# Patient Record
Sex: Male | Born: 1940 | Race: White | Hispanic: No | Marital: Married | Smoking: Former smoker
Health system: Southern US, Community
[De-identification: ages and names within clinical notes are randomized; demographics above are authoritative.]

## PROBLEM LIST (undated history)

## (undated) DIAGNOSIS — J189 Pneumonia, unspecified organism: Secondary | ICD-10-CM

## (undated) DIAGNOSIS — I714 Abdominal aortic aneurysm, without rupture, unspecified: Secondary | ICD-10-CM

## (undated) DIAGNOSIS — I4821 Permanent atrial fibrillation: Secondary | ICD-10-CM

## (undated) DIAGNOSIS — M1990 Unspecified osteoarthritis, unspecified site: Secondary | ICD-10-CM

## (undated) DIAGNOSIS — E119 Type 2 diabetes mellitus without complications: Secondary | ICD-10-CM

## (undated) DIAGNOSIS — Z9289 Personal history of other medical treatment: Secondary | ICD-10-CM

## (undated) DIAGNOSIS — E785 Hyperlipidemia, unspecified: Secondary | ICD-10-CM

## (undated) DIAGNOSIS — I4891 Unspecified atrial fibrillation: Secondary | ICD-10-CM

## (undated) DIAGNOSIS — U071 COVID-19: Secondary | ICD-10-CM

## (undated) DIAGNOSIS — H9193 Unspecified hearing loss, bilateral: Secondary | ICD-10-CM

## (undated) DIAGNOSIS — I739 Peripheral vascular disease, unspecified: Secondary | ICD-10-CM

## (undated) DIAGNOSIS — K635 Polyp of colon: Secondary | ICD-10-CM

## (undated) DIAGNOSIS — I729 Aneurysm of unspecified site: Secondary | ICD-10-CM

## (undated) DIAGNOSIS — I10 Essential (primary) hypertension: Secondary | ICD-10-CM

## (undated) DIAGNOSIS — N200 Calculus of kidney: Secondary | ICD-10-CM

## (undated) DIAGNOSIS — N2889 Other specified disorders of kidney and ureter: Secondary | ICD-10-CM

## (undated) DIAGNOSIS — J449 Chronic obstructive pulmonary disease, unspecified: Secondary | ICD-10-CM

## (undated) HISTORY — DX: Other specified disorders of kidney and ureter: N28.89

## (undated) HISTORY — DX: Essential (primary) hypertension: I10

## (undated) HISTORY — DX: Calculus of kidney: N20.0

## (undated) HISTORY — DX: Abdominal aortic aneurysm, without rupture: I71.4

## (undated) HISTORY — PX: PILONIDAL CYST EXCISION: SHX744

## (undated) HISTORY — DX: Type 2 diabetes mellitus without complications: E11.9

## (undated) HISTORY — DX: Unspecified atrial fibrillation: I48.91

## (undated) HISTORY — DX: COVID-19: U07.1

## (undated) HISTORY — DX: Polyp of colon: K63.5

## (undated) HISTORY — DX: Abdominal aortic aneurysm, without rupture, unspecified: I71.40

## (undated) HISTORY — DX: Unspecified osteoarthritis, unspecified site: M19.90

## (undated) HISTORY — DX: Permanent atrial fibrillation: I48.21

## (undated) HISTORY — PX: FRACTURE SURGERY: SHX138

## (undated) HISTORY — DX: Unspecified hearing loss, bilateral: H91.93

---
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
## 2004-04-09 ENCOUNTER — Emergency Department (HOSPITAL_COMMUNITY): Admission: EM | Admit: 2004-04-09 | Discharge: 2004-04-09 | Payer: Self-pay | Admitting: Emergency Medicine

## 2007-06-01 DIAGNOSIS — N2889 Other specified disorders of kidney and ureter: Secondary | ICD-10-CM

## 2007-06-01 HISTORY — DX: Other specified disorders of kidney and ureter: N28.89

## 2007-12-30 DIAGNOSIS — Z9289 Personal history of other medical treatment: Secondary | ICD-10-CM

## 2007-12-30 HISTORY — DX: Personal history of other medical treatment: Z92.89

## 2007-12-30 HISTORY — PX: EMBOLIZATION: SHX1496

## 2008-01-06 ENCOUNTER — Ambulatory Visit: Payer: Self-pay | Admitting: Cardiology

## 2008-01-06 ENCOUNTER — Emergency Department (HOSPITAL_COMMUNITY): Admission: EM | Admit: 2008-01-06 | Discharge: 2008-01-06 | Payer: Self-pay | Admitting: Emergency Medicine

## 2008-01-06 ENCOUNTER — Inpatient Hospital Stay (HOSPITAL_COMMUNITY): Admission: EM | Admit: 2008-01-06 | Discharge: 2008-01-14 | Payer: Self-pay | Admitting: Emergency Medicine

## 2008-01-10 ENCOUNTER — Encounter: Payer: Self-pay | Admitting: Urology

## 2008-01-11 ENCOUNTER — Encounter (INDEPENDENT_AMBULATORY_CARE_PROVIDER_SITE_OTHER): Payer: Self-pay | Admitting: Internal Medicine

## 2008-01-30 ENCOUNTER — Ambulatory Visit: Payer: Self-pay | Admitting: Cardiology

## 2008-02-28 ENCOUNTER — Ambulatory Visit (HOSPITAL_COMMUNITY): Admission: RE | Admit: 2008-02-28 | Discharge: 2008-02-28 | Payer: Self-pay | Admitting: Urology

## 2008-03-12 ENCOUNTER — Ambulatory Visit: Payer: Self-pay | Admitting: Cardiology

## 2008-03-18 ENCOUNTER — Ambulatory Visit: Payer: Self-pay | Admitting: Vascular Surgery

## 2008-03-21 ENCOUNTER — Ambulatory Visit (HOSPITAL_COMMUNITY): Admission: RE | Admit: 2008-03-21 | Discharge: 2008-03-21 | Payer: Self-pay | Admitting: Cardiology

## 2008-03-21 ENCOUNTER — Encounter: Payer: Self-pay | Admitting: Cardiology

## 2008-03-21 ENCOUNTER — Ambulatory Visit: Payer: Self-pay | Admitting: Cardiology

## 2008-04-16 ENCOUNTER — Ambulatory Visit: Payer: Self-pay | Admitting: Cardiology

## 2009-06-03 ENCOUNTER — Encounter: Admission: RE | Admit: 2009-06-03 | Discharge: 2009-06-03 | Payer: Self-pay | Admitting: Vascular Surgery

## 2009-06-03 ENCOUNTER — Ambulatory Visit: Payer: Self-pay | Admitting: Vascular Surgery

## 2010-06-22 ENCOUNTER — Encounter: Payer: Self-pay | Admitting: Emergency Medicine

## 2010-06-22 ENCOUNTER — Encounter: Payer: Self-pay | Admitting: Vascular Surgery

## 2010-09-09 ENCOUNTER — Other Ambulatory Visit: Payer: Self-pay | Admitting: Vascular Surgery

## 2010-09-09 DIAGNOSIS — I714 Abdominal aortic aneurysm, without rupture, unspecified: Secondary | ICD-10-CM

## 2010-09-22 ENCOUNTER — Ambulatory Visit: Payer: Self-pay | Admitting: Vascular Surgery

## 2010-09-22 ENCOUNTER — Other Ambulatory Visit: Payer: Self-pay

## 2010-10-13 NOTE — Group Therapy Note (Signed)
NAMEMELIK, BLANCETT NO.:  [id number]   MEDICAL RECORD NO.:  [id number]          PATIENT TYPE:  INP   LOCATION:  A319                          FACILITY:  [facility]   PHYSICIAN:  Skeet Latch, DO    DATE OF BIRTH:  01-05-1941   DATE OF PROCEDURE:  01/07/2008  DATE OF DISCHARGE:                                 PROGRESS NOTE   SUBJECTIVE:  [name] is still having left flank/back pain this  morning.  The patient states it is slightly improved but still has  moderate to severe flank pain.  The patient has no chest pains or any  other associated symptoms.  The patient states that he thought he had to  have a bowel movement this morning on his lower left side but when he  went to the restroom he did not have to go.   OBJECTIVE:  VITAL SIGNS:  Temperature is 98.2, pulse 92, respirations  24, blood pressure 95/66.  CARDIOVASCULAR:  Regular rate and rhythm.  No murmurs, rubs, gallops.  LUNGS:  Clear to auscultation bilaterally.  No rales, rhonchi or  wheezes.  ABDOMEN:  Soft, obese, nontender, nondistended.  MUSCULOSKELETAL:  Continues to have left flank pain and some slight left-  sided back pain and still tender on percussion.  EXTREMITIES:  No clubbing, cyanosis or edema.   LABORATORY DATA:  His PT is 30.9, INR 2.7, PTT 54, white count 17.7,  hemoglobin 1.9, hematocrit 34.7, platelet count is 259, phosphorous 3.5,  magnesium 1.9, sodium 137, potassium 3.8, chloride is 98, CO2 28,  glucose 170, BUN 23, creatinine 1.57.   ASSESSMENT AND PLAN:  1. Left-sided renal colic with intraparenchymal subscapular hemorrhage      of the left kidney.  At this time the patient's blood pressure is      slightly lower than yesterday and he is slightly tachycardiac.  At      this point we will get a urology consult.  We will also get a stat      H&H to check his blood count at this time.  We will increase his IV      fluids and maintain him on IV pain medications.  If patient's   condition changes the patient may need to be transferred to the      ICU.  2. Atrial fibrillation.  This seems to be stable.  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  Seems to be rate controlled at this time.  However,      will need to hold his blood pressure medications secondary to some      hypotension at this time.  At this time I will await urology      recommendations.      Skeet Latch, DO  Electronically Signed     SM/MEDQ  D:  01/07/2008  T:  01/07/2008  Job:  [id number]

## 2010-10-13 NOTE — H&P (Signed)
[name], [name]                ACCOUNT NO.:  [id number]   MEDICAL RECORD NO.:  [id number]          PATIENT TYPE:  INP   LOCATION:  A319                          FACILITY:  [facility]   PHYSICIAN:  Skeet Latch, DO    DATE OF BIRTH:  04-08-1941   DATE OF ADMISSION:  01/06/2008  DATE OF DISCHARGE:  LH                              HISTORY & PHYSICAL   CHIEF COMPLAINT:  Left flank pain and nausea and vomiting.   HISTORY OF PRESENT ILLNESS:  This is a 70 year old Caucasian male who  presented to the emergency room earlier today complaining of bilateral  flank pain.  The patient states that the pain started when he awoke this  morning and was 9-10/10 in severity.  The patient states after this  pain, he was getting some nausea and vomiting and decided come to  emergency room to be evaluated.  The patient was seen in the emergency  room earlier today, found to have some flank pain and hematuria and was  given Percocet and Phenergan and told to follow with urology.  The  patient said he went home, felt a little bit better.  The pain became  more severe, and he returned to the hospital.  Upon returning to the  hospital, CT of his abdomen and pelvis revealed an acute  intraparenchymal and subscapular hemorrhage his left kidney.  The  patient denies any history of any trauma to his flank area.  The patient  does take Coumadin for history of atrial fibrillation, however.   PAST MEDICAL HISTORY:  Includes atrial fibrillation, hypertension,  history of kidney stones.  Meniere's disease.   SOCIAL HISTORY:  He is a smoker.  No history of drug or alcohol abuse.   FAMILY HISTORY:  His mother died from renal cancer.  Has a nephew who  recently died, had a history of kidney disease also.   SURGICAL HISTORY:  Pilonidal cyst removed, and leg fracture repair.   HOME MEDICATIONS:  Include aspirin 81 mg daily, Benicar 40 mg daily,  potassium 240 mg daily, meclizine 25 mg daily, multivitamin daily,  Nasonex 1 spray each nostril at bedtime, trazodone 50 mg at bedtime,  triamterene/hydrochlorothiazide 37.5/25 mg daily, Coumadin, specialized  dosing, Zyrtec 10 mg daily.   ALLERGIES:  NO KNOWN DRUG ALLERGIES.   REVIEW OF SYSTEMS:  GENERAL:  No weight loss or weight gain.  No fever  or chills.  GASTROINTESTINAL:  Positive for some nausea and vomiting.  No bloody stools.  GENITOURINARY:  Slight hematuria.  No dysuria.  CARDIOVASCULAR:  No chest pain, palpitations.  RESPIRATORY:  No  shortness of breath or dyspnea.  MUSCULOSKELETAL:  Positive for flank  pain, left greater than right.   PHYSICAL EXAMINATION:  VITAL SIGNS:  Temperature is 98.6, blood pressure  114/58, pulse 79, respirations 16.  He is satting 100%.  GENERAL:  He is an obese 70 year old Caucasian male, well-developed,  well-nourished, well-hydrated, looks uncomfortable on exam.  HEENT:  Head is atraumatic, normocephalic.  Eyes are PERRL.  EOMI.  NECK:  Soft, supple, nontender, nondistended.  No scleral icterus.  CARDIOVASCULAR:  Regular rate and rhythm.  No murmurs.  LUNGS:  Clear to auscultation bilaterally.  No rales, rhonchi or  wheezing or expiratory effort.  ABDOMEN:  Obese, soft, nontender, nondistended.  MUSCULOSKELETAL:  Positive for left flank pain, per patient.  No signs  of bruising noted in his flank area.  The patient is tender on  percussion to his left flank area.  EXTREMITIES:  No clubbing, cyanosis or edema.  NEUROLOGICALLY:  Cranial nerves 2-12 are grossly intact.  The patient is  alert and oriented x3.   LABS:  PTT is 41, PT 29.8, INR is 2.6.  Urine, microscopic 3-6 WBCs,  many bacteria.  His urinalysis, small amount of blood.  No nitrites or  leukocytes.  Sodium 137, potassium 4.6, chloride 102, CO2 is 27, glucose  117, BUN 23, creatinine 1.25, white count 11.8, hemoglobin 14.7,  hematocrit 43.5, platelet count is 198.   RADIOLOGIC STUDIES:  CT of his abdomen and pelvis showed acute   intraparenchymal and subscapular hemorrhage of the left kidney starting  at the lower pole and extending to the interpolar region.  This is not  causing significant renal obstruction and does not extend beyond the  confines of the kidney.  Appearance is suspicious for hemorrhage related  to underlying tumor.  Other considerations will be vascular  malformation, bleeding from any type of recent procedures, trauma or  spontaneous bleed.  Ultimately assessment with contrast and a CT or MRI  will be helpful to exclude underlying tumor, one an acute component of  hemorrhage has resolved.  Contrast studies of acute hemorrhage may not  be very accurate in depicting tumor.  Mild dilatation of distal  abdominal aorta to 3.3 cm.  There is no evidence of aortic leaks.  The  pelvis showed no acute findings.  Nonspecific enlargement of seminal  vesicles.   ASSESSMENT:  1. Left flank pain with intraparenchymal and subscapular hemorrhage of      the left kidney  2. History of atrial fibrillation.  3. History of hypertension.  4. Significant family history of renal disease.   PLAN:  1. The patient will be admitted to the general service of [facility].  2. For his flank pain and intraparenchymal hemorrhage of his left      kidney, at this time will follow the patient's hemoglobin and      hematocrit.  It is seen to be stable at this time.  At this time we      will just do pain control and watchful waiting.  If any changes in      his condition, the patient may need a surgical consult.  Will await      any changes in his condition.  The patient will need a CT or MRI      once his symptoms resolve, and the patient does not have as much      pain in his flank area.  3. For his atrial fibrillation, it is rate controlled at this time.      Continue his home medications.  Will hold all anticoagulants and      aspirin and NSAID secondary to his hemorrhage around his kidney.  4. Hypertension, again stable  at this time.  Will continue his home      medications.  5. Lastly, the patient be on DVT and GI prophylaxis.      Skeet Latch, DO  Electronically Signed     SM/MEDQ  D:  01/06/2008  T:  01/06/2008  Job:  [id number]

## 2010-10-13 NOTE — Letter (Signed)
April 16, 2008    Catalina Pizza, MD  [address] [place],  [place] [address]   RE:  DYMOND, SPREEN  MRN:  [id number]  /  DOB:  [birth year]   Dear Ian Malkin:   Mr. 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  Since his last visit, he has done  well from a symptomatic standpoint.  He was evaluated by Dr. 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  He  underwent a transesophageal echocardiogram that did not show high risk  features for thromboembolism.  He did have some mild atheroma in the  thoracic aorta and had mildly reduced left atrial appendage velocities.   Medications are unchanged from his last visit.   PHYSICAL EXAMINATION:  GENERAL:  Pleasant gentleman in no acute  distress.  VITAL SIGNS:  The weight is 213, 6 pounds more than in October.  Blood  pressure 100/80, heart rate 70 and regular, respirations 14.  NECK:  No jugular venous distention; no carotid bruits.  LUNGS:  Rales at the left base.  CARDIAC:  Normal first and second heart sounds; minimal systolic murmur.  ABDOMEN:  Soft and nontender; no organomegaly.  EXTREMITIES:  No edema.   IMPRESSION:  Mr. Prosise is doing generally well.  It is close call as to  whether he should be treated with aspirin or Coumadin for reduction of  thromboembolic risk.  Since he has had a significant Coumadin  complication, I believe that aspirin would be preferable.  We will  increase the dose to 325 mg daily.   With respect to hyperlipidemia, he would benefit from a higher dose of  statin.  Per the request of his insurance company, we will provide him  with 40 mg tablets to be taking one-half tablet per day.  He will  arrange for a followup lipid profile in your office.  I will see this  nice gentleman again in 9 months.    Sincerely,      Gerrit Friends. Dietrich Pates, MD, [facility]  Electronically Signed    RMR/MedQ  DD: 04/16/2008  DT: 04/17/2008  Job #: [id number]

## 2010-10-13 NOTE — Assessment & Plan Note (Signed)
[facility]                       [place] CARDIOLOGY OFFICE NOTE   [name], [name]                       MRN:          [id number]  DATE:01/30/2008                            DOB:          January 31, 1941    CARDIOLOGIST:  He is new to Dr. Dietrich Pates.   PRIMARY CARE PHYSICIAN:  Catalina Pizza, MD   REASON FOR VISIT:  Posthospitalization follow-up.   HISTORY OF PRESENT ILLNESS:  [name] is a 70 year old male patient  with a history of permanent atrial fibrillation, who was previously on  Coumadin therapy who was recently admitted to [facility] with  left renal hematoma with extension into the retroperitoneum requiring  transfusion with FFP and packed red blood cells and eventual  embolization of the left renal artery branch.  He had previously been  followed by Dr. Assunta Curtis at [facility] for cardiology care.  His  aspirin and Coumadin were both discontinued.  He was followed by our  service throughout his hospital stay.  He did have an echocardiogram  that revealed normal LV function and no significant valvular  abnormalities.  His rate was fairly well controlled.  Dr. Dietrich Pates did  note that the patient could possibly be managed in the future without  full dose of warfarin.  Of note, his CHADS2 score is 1 (hypertension  only).  He returns to the office today for followup.  He denies any  chest pain.  He does have chronic dyspnea on exertion.  This is chronic  without change.  He describes NYHA class II symptoms.  He denies  orthopnea, PND, or pedal edema.  Denies any palpitations.  Denies any  syncope.   CURRENT MEDICATIONS:  Benicar 40 mg 1/2 tablet daily, Diltiazem CD 240  mg daily, Meclizine 25 mg daily, Multivitamin daily Nasonex,  Trazodone 50 mg nightly, Triamterene and hydrochlorothiazide 37.5/25 mg  1/2 tablet daily.   PHYSICAL EXAMINATION:  GENERAL:  He is a well-nourished, well-developed  male in no acute distress.  VITAL  SIGNS:  Blood pressure is 92/78, pulse 86, and weight 207 pounds.  HEENT:  Normal.  NECK:  Without JVD.  CARDIOVASCULAR:  S1 and S2.  Irregularly irregular rhythm.  LUNGS:  Decreased breath sounds bilaterally throughout the bases with  crackles bilaterally.  No wheezes.  ABDOMEN:  Soft, nontender.  EXTREMITIES:  Without edema.  NEUROLOGIC:  He is alert and oriented x3.  Cranial nerves II through XII  grossly intact.   Electrocardiogram reveals atrial fibrillation with heart rate of 77,  normal axis, no acute changes.   ASSESSMENT AND PLAN:  1. Permanent atrial fibrillation.  The patient has a CHADS2 score of 1      (hypertension).  He was previously on Coumadin and aspirin.  These      have both been discontinued secondary to his recent renal hematoma.      He is currently still off of aspirin and Coumadin.  He has followed      up recently with Dr. Jerre Simon.  Plans are for relook CT scan at the      end of this month.  I have been in touch with Dr. Rudean Haskell office      to find out when it would be safe for [place] to restart aspirin.  Once      it is felt to be safe to restart aspirin, the patient will be      contacted.  His rate is fairly well controlled at this time.  No      medication changes will be made.  He is asymptomatic from his      atrial fibrillation.  He had been offered cardioversion in the      past, but he has not been interested previously.  2. Status post recent left renal hematoma.  Overall, this seems to be      stable.  He will continue follow up with Dr. Jerre Simon and we will      hold off on restarting aspirin until it is felt to be safe.  3. History of hypertension.  His blood pressure is somewhat low today.      He is asymptomatic with this.  No medication change will be made.   DISPOSITION:  The patient will be brought back in 6 weeks with Dr.  Dietrich Pates.  He knows to return sooner p.r.n.   ADDENDUM:  Dr. Jerre Simon contacted our office and communicated that it was   acceptable to restart aspirin from his point of view.  Therefore, the  patient will be asked to start [facility] ASA 81 mg daily.      Tereso Newcomer, PA-C  Electronically Signed      Gerrit Friends. Dietrich Pates, MD, [facility]  Electronically Signed   SW/MedQ  DD: 01/30/2008  DT: 01/31/2008  Job #: [id number]   cc:   Catalina Pizza, M.D.

## 2010-10-13 NOTE — Group Therapy Note (Signed)
[name], TOUT NO.:  [id number]   MEDICAL RECORD NO.:  [id number]          PATIENT TYPE:  INP   LOCATION:  IC10                          FACILITY:  [facility]   PHYSICIAN:  Skeet Latch, DO    DATE OF BIRTH:  1940-11-26   DATE OF PROCEDURE:  01/09/2008  DATE OF DISCHARGE:                                 PROGRESS NOTE   SUBJECTIVE:  Mr. Gardella is a 70 year old Caucasian male who presented to  the emergency room a few days prior complaining of bilateral flank pain.  That morning, the patient awoke and said he had flank pain.  He rated it  a 9/10 to 10/10 in severity.  The patient came to the emergency room  where he was evaluated.  He was found to have some flank pain and  hematuria.  He was given Percocet and Phenergan and told to follow up  with urology.  The patient went home, felt a little better, the pain  became more severe and he returned to the hospital.  On returning to the  hospital, a CT of his abdomen and pelvis showed acute intraparenchymal  and subscapular hemorrhage of his left kidney.  The patient was admitted  for observation, did well until late that evening when he had more  severe flank pain and some abdominal pain, and was found on a repeat CT  to have his renal hematoma down to his retroperitoneum and into his  extraperitoneal pelvis.  The patient's pain has been moderate to severe.  Urology has been following the patient.  He has been receiving multiple  packed red blood cells and fresh frozen plasma.  Today, the patient is  still having some flank pain and had some shortness of breath this  morning.  Has received another 3 units of packed red blood cells and  another 2 units as well of fresh frozen plasma has been ordered.   OBJECTIVE:  VITAL SIGNS:  Temperature is 98, pulse 95, respirations 26,  blood pressure 112/88, satting 93% on 3L.  CARDIOVASCULAR:  Regular rate and rhythm.  No murmurs, rubs, or gallops.  LUNGS:  Decreased, no  appreciated rhonchi, rales, or wheezing.  ABDOMEN:  Obese, soft.  Still has some generalized tenderness,  especially left lower quadrant on deep palpation.  Decreased bowel  sounds are appreciated.  MUSCULOSKELETAL:  He still has a moderate to severe flank pain.  No  bruising is appreciated.   LABS:  Hemoglobin is 12.1, hematocrit 35.  Sodium 133, potassium 3.9,  chloride 101, CO2 30, glucose 114.  BUN 18, creatinine 1.29.  BNP is 97.  PT is 24.9, INR 2.1, white count is 10,000.   ASSESSMENT AND PLAN:  1. Left renal hematoma with extension to his retroperitoneum into his      extraperitoneal pelvis.  I spoke with urology.  At this point, we      feel the patient needs intervention.  The patient is scheduled for      an angiogram at [facility] tomorrow.  Urology has spoken to      interventional radiology  at [facility] and he will be scheduled for      a possible renal angiogram tomorrow in the a.m.  At this time, we      will continue with infusions of packed red blood cells, FFP, and      vitamin K.  The patient is also being followed by cardiology      secondary to his atrial fibrillation.  2. Pneumonia.  The patient was on Rocephin.  Will add vancomycin at      this time.  Continue to follow his white count, which is not      elevated at this time.  3. The patient needs to be followed closely for any changes in his      condition, especially his blood pressure and his pain.  Increase      his pain medications and add breathing treatments around the clock      to his treatment program.      This has been discussed with urology, and they are in agreement at      this time.  4. After his renal angiogram at [facility], at this time I believe the      patient will be transferred back to [facility] after his procedure.      Skeet Latch, DO  Electronically Signed     SM/MEDQ  D:  01/09/2008  T:  01/09/2008  Job:  [id number]

## 2010-10-13 NOTE — Consult Note (Signed)
VASCULAR SURGERY CONSULTATION   [name], [name]  DOB:  12/10/1940                                       03/18/2008  ZOXWR#:[id number]   The patient was referred for vascular surgery consultation by Dr.  Dietrich Pates for a superior mesenteric artery aneurysm.  This 71 year old  gentleman had an episode of acute abdominal discomfort in September of  this year requiring hospitalization.  He was found to have a spontaneous  bleed from his left renal artery which was treated with embolization of  branch vessels.  He had a significant retroperitoneal hematoma which had  occurred.  He had been on Coumadin prior to that time for chronic atrial  fibrillation.  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  He was  referred for evaluation of this aneurysm.  He denies any postprandial  pain, weight loss, change in his bowel habits or any abdominal symptoms  other than some occasional vague lower abdominal symptoms since his  bleed.   PAST MEDICAL HISTORY:  1. Hypertension.  2. History of April fibrillation.  3. History of spontaneous bleed, hemorrhage from the left kidney.  4. Meniere's disease, worse on the left.  5. Negative for diabetes, coronary artery disease, COPD or stroke.   PREVIOUS SURGERY:  1. As a child, fractured right lower extremity.  2. Pilonidal cyst.   FAMILY HISTORY:  Positive for hypertension in his mother, stroke in his  father, atrial fib in a sister, negative for diabetes.   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  Has smoked 1 pack of cigarettes per day for 40+ years  but stopped in August 2009.  Does not use alcohol.   REVIEW OF SYSTEMS:  Does have occasional dizziness, denies any lower  extremity claudication.  No GI or GU symptoms.  No chest pain or  dyspnea  on exertion.   ALLERGIES:  None.   MEDICINES:  Please see health history form.   PHYSICAL EXAM:  Blood pressure is 108/72, heart rate is 80, respirations  14.  General:  He is a middle-aged male in no apparent distress, alert  and x3.  Neck:  Supple, 3+ carotid pulses palpable.  No bruits are  audible.  Neurologic Exam:  Normal.  No palpable adenopathy in the neck.  Chest:  Clear to auscultation.  Cardiovascular Exam:  Reveals a regular  rhythm, no murmurs.  Abdomen:  Soft, nontender with no masses.  He has  3+ femoral and popliteal pulses bilaterally.  Both feet are well-  perfused with mild edema.   I reviewed the CT angiogram and agree that he does have a 1.28 cm  aneurysm in his superior mesenteric artery as well as a small abdominal  aortic aneurysm measuring about 3.6 cm.  The superior mesenteric artery  aneurysm is not at its origin, but down about 5 cm distally where  multiple intestinal branches arise from the aneurysm.  It does have  significant laminated thrombus as one would expect.   He does not have symptoms of mesenteric ischemia, so I do not think this  is causing any blood flow reduction.  It would be a complex repair  because  of its location and could result in some bowel infarction by  sacrificing branches which are arising from it.  I think we should  follow this with serial CT scans and if it enlarges significantly it may  require surgical treatment.  I will see him back in 9 months with a  repeat CT angiogram to monitor this.  I discussed this at length with  him and his wife.   Quita Skye Hart Rochester, M.D.  Electronically Signed  JDL/MEDQ  D:  03/18/2008  T:  03/19/2008  Job:  [id number]   cc:   Gerrit Friends. Dietrich Pates, MD, [facility]  Catalina Pizza, M.D.

## 2010-10-13 NOTE — Letter (Signed)
March 12, 2008    Catalina Pizza, M.D.  [address] [place],  [place] [address].   RE:  [name], [name]  MRN:  [id number]  /  DOB:  04/24/1941   Dear [name],   [name] returns to the office for continued assessment and treatment  of chronic atrial fibrillation with a recent left renal capsule  hemorrhage requiring embolization by Interventional Radiology.  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  [name] has been found to have  hyperlipidemia and has been started on rosuvastatin.  His laboratory  results are not available to me.  He reports generalized fatigue and  wonders if his blood pressure is too low.   CURRENT MEDICATIONS:  1. Benicar 20 mg daily.  2. Diltiazem 240 mg daily.  3. Meclizine 25 mg daily.  4. Trazodone 50 mg nightly.  5. Triamterene/hydrochlorothiazide 1/2 tablet daily.  6. Aspirin 81 mg daily.  7. Rosuvastatin 5 mg daily.  8. He uses Zyrtec p.r.n..   PHYSICAL EXAMINATION:  GENERAL:  A very pleasant, healthy-appearing  gentleman.  VITAL SIGNS:  The weight is 207, stable.  Blood pressure 100/60, heart  rate is 64 and irregular, respirations 14.  NECK:  No jugular venous distention; no carotid bruits.  LUNGS:  Left basilar rales.  CARDIAC:  Irregular rhythm; normal first and second heart sounds; normal  PMI.  ABDOMEN:  Aortic pulsation not palpable; no bruits; no organomegaly.  EXTREMITIES:  No edema.   Hospital records were obtained and were reviewed.  The issues  surrounding of [name]'s atrial fibrillation and vascular disease were  discussed in detail.  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  A  transesophageal echocardiogram will be performed to further evaluate Mr.  Benson's risk and to determine whether or not warfarin will be restarted.  For now, aspirin therapy will be adequate.   His abdominal aortic aneurysm is small and certainly needs no attention  at the present time.  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  I  doubt that any intervention is required for this lesion, but will refer  [name] to Vascular Surgery (Dr. Edilia Benson) for evaluation.  I have  asked for a copy of his lipid profile.  Most likely, he would benefit  from a higher dose of a statin.  Perhaps he could be given 40 mg tablets  of Crestor and take 20 mg per day.  I will meet again with [name]  after his echocardiogram has been performed to further discuss  antithrombotic therapy in atrial fibrillation.    Sincerely,      [name]. Dietrich Pates, MD, [facility]  Electronically Signed    RMR/MedQ  DD: 03/12/2008  DT: 03/13/2008  Job #: [id number]

## 2010-10-13 NOTE — Consult Note (Signed)
[name], [name]                ACCOUNT NO.:  [id number]   MEDICAL RECORD NO.:  [id number]          PATIENT TYPE:  INP   LOCATION:  IC10                          FACILITY:  [facility]   PHYSICIAN:  Ky Barban, M.D.DATE OF BIRTH:  [birth year]   DATE OF CONSULTATION:  DATE OF DISCHARGE:                                 CONSULTATION   CHIEF COMPLAINT:  Left flank pain.   HISTORY:  This 70 year old gentleman came to the emergency room  yesterday complaining of pain in his left flank with some nausea, and he  was seen and treated in the ER and sent home with advice to see the  urologist, but during the night, the patient was having worse pain so he  was brought back in the emergency room where a CT scan showed that he  has acute intraparenchymal and subcapsular hemorrhage in his left  kidney.  The patient was admitted for observation.  He was stable, and  today we found that his blood pressure dropped and his hematocrit had  also dropped.  On admission yesterday his hematocrit was 43.5, and this  morning it came down to 28.9, so it was immediately decided to go ahead  and repeat a CT which was repeated.  It showed that the subcapsular  hematoma apparently has ruptured into the retroperitoneum and the  bladder spread towards the pelvis and then it is there in the extra  peritoneal space.  The patient clinically stable.  He does not seem to  be in shock, but his blood pressure is running low.  When I came to see  him, his blood pressure systolic was 86.  He is getting IV fluids, and  he got 1 unit of fresh frozen plasma and still waiting for his blood,  which came ready while I was seeing him, so we have hung the first unit  of blood.  His blood pressure systolic was 97/60.  So, I have already  spoken to the radiologist, Dr. Fredia Sorrow .  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  He can do renal angiogram with  embolization if need to be, so I suggested let us give him  couple of  units of blood.  If he stabilizes he needs to be resting in the bed and  then if he remained stable that will be fine and if we find that he is  not stabilizing, then we have to transfer him to a radiologist in  Radiology Department in [place] for embolization.  So, he  understands, his doctor, Dr. Lilian Kapur understands and I have discussed  this approach with the patient and his wife.   On examination, he was fully conscious, alert, and oriented.  He does  not appear to be in any acute distress and does not appear to be in  shock.  Clinically, his blood pressure is 86/60, pulse is 80 and  permanent, and O2 saturation is 93 and he is getting nasal oxygen.  Abdomen is full.  Liver, spleen, and kidneys are not palpable.  There is  no disk herniation in his  flank.  External genitalia is unremarkable.  Rectal exam is deferred.   IMPRESSION:  1. Left renal hematoma.  2. History of hypertension.  3. History of pulmonary embolism.   NOTE:  1. I need to add few other things.  This patient is on Coumadin,      because he has history of having atrial fibrillation.  He is on      this medicine for the last 1 year, and they have monitored his pro-      time closely today.  Last time, his pro-time was 29.8; today, it is      30.9.  We have given him 1 unit of fresh frozen plasma and thinking      about giving him vitamin K.  2. We have discontinued his all hypertensive medications.  We are      going to giving him first unit of blood, and he will get the second      unit.  We will put a Foley catheter to monitor his urine output,      and I think we need to monitor his urine output at least hourly to      see what it is and that will tell me about the perfusion of his      kidneys and status of his low blood pressure.   Thank you.  We will follow.      Ky Barban, M.D.  Electronically Signed     MIJ/MEDQ  D:  01/07/2008  T:  01/08/2008  Job:  [id number]

## 2010-10-13 NOTE — Group Therapy Note (Signed)
[name], [name]                ACCOUNT NO.:  [id number]   MEDICAL RECORD NO.:  [id number]          PATIENT TYPE:  INP   LOCATION:  IC10                          FACILITY:  [facility]   PHYSICIAN:  Skeet Latch, DO    DATE OF BIRTH:  [birth year]   DATE OF PROCEDURE:  01/08/2008  DATE OF DISCHARGE:                                 PROGRESS NOTE   SUBJECTIVE:  [name] pain seems to be stabilized at this point.  The  patient is still having flank pain with some radiation to his left lower  abdomen, but seems to be slightly improved.  The patient still has some  feelings of being bloated, but overall, he seems to be doing slightly  better.  At this time, the patient states that he is hungry and is  requesting something to eat.  The patient's blood pressure and  hemoglobin seem to be stable at this time.   OBJECTIVE:  VITAL SIGNS:  Blood pressure 119/87, heart rate 80, satting  93%.  His respiratory rate is 15.  CARDIOVASCULAR:  He is regular rate and rhythm.  LUNGS:  Clear to auscultation bilaterally.  No rhonchi, rales, or  wheezing.  ABDOMEN:  Obese, soft, with some generalized tenderness on deep  palpation.  MUSCULOSKELETAL:  Still has some left flank pain, tender on percussion.  No bruising is noted.   LABS:  Sodium 132, potassium 4.0, chloride 198, CO2 29, glucose 126, BUN  24, creatinine 1.6.  His white count is 10.9, hemoglobin 10, hematocrit  28.9, platelet count is 158,000.  CT yesterday showed enlarging left  perirenal hematoma extending down the retroperitoneum into the  extraperineal pelvis.   ASSESSMENT AND PLAN:  1. Renal hematoma with extension to his retroperitoneum into his      extraperineal pelvis, seems to be stable at this time.  The patient      being followed by urology and we will defer to them regarding      further treatment at this time.  Will continue to check his      hemoglobin and hematocrit as well as follow his blood pressure.      Continue the  patient with IV fluids also.  2. Atrial fibrillation.  The patient will get a cardiology consult at      this time secondary to the patient not being able to be on      anticoagulant.  The patient's heart rate seems to be doing fairly      well at this time.  Will await their recommendations regarding      further treatment for his atrial fibrillation.      Skeet Latch, DO  Electronically Signed     SM/MEDQ  D:  01/08/2008  T:  01/08/2008  Job:  [id number]

## 2010-10-13 NOTE — Consult Note (Signed)
NAMEGERHARDT, GLEED                ACCOUNT NO.:  [id number]   MEDICAL RECORD NO.:  [id number]          PATIENT TYPE:  INP   LOCATION:  IC10                          [facility]ILITY[facility]   PHYSICIAN:  Gerrit Friends. Dietrich Pates, MD, FACCDATE OF BIRTH:  1941/04/24   DATE OF CONSULTATION:  01/08/2008  DATE OF DISCHARGE:                                 CONSULTATION   REFERRING PHYSICIAN:  Dr. Lilian Kapur.   PRIMARY CARE PHYSICIAN:  Dr. Garret Reddish, in [place].   PRIMARY CARDIOLOGIST:  Dr. Assunta Curtis in [place].   HISTORY OF PRESENT ILLNESS:  A 70 year old gentleman with a history of  atrial fibrillation for which he has been anticoagulated, now admitted  with left renal parenchymal hemorrhage extending to the retroperitoneum.  Mr. Laubacher was first found to have atrial fibrillation approximately 1  year ago.  His only apparent contributing factor is hypertension.  It  was treated with rate controlled medications for few months and then the  decision was made to start anticoagulation, although this was presented  to him as a borderline indication.  He initially did well, but developed  severe left flank pain associated with nausea on the day of admission  and presented to the emergency department after number of episodes of  emesis of stomach contents.  Hematuria was also noted.  He was treated  with analgesics and discharged to home; however, his symptoms  intensified prompting to return to the hospital where a CT scan showed  an acute intraparenchymal and subcapsular left renal hemorrhage.  He was  treated with 2 units of fresh frozen plasma and has received 3 units of  blood with stabilization of his hemodynamics and hematocrit.  At this  time, he continues to have some pain, but feels relatively well.   PAST MEDICAL HISTORY:  Otherwise notable for history of nephrolithiasis  and Meniere's disease.   Prior surgeries have included excision of pilonidal cyst and ORIF of a  long bone fracture  of the leg.   SOCIAL HISTORY:  Smokes cigarettes with a total consumption  approximately 30 pack years.  No excessive use of alcohol.  Married and  lives locally.  Physicians are fairly remote due to previous residence  [place].   FAMILY HISTORY:  Mother died due to renal carcinoma.  No family history  for arrhythmias or coronary disease.   Review of current medications, aspirin 81 mg daily, Benicar 40 mg daily,  KCl 20 mEq daily, meclizine 25 mg daily, Nasonex, trazodone 50 mg  nightly. Triamterene/hydrochlorothiazide 37.5/25 mg daily, warfarin per  MD instructions, Zyrtec 10 mg daily, and diltiazem 240 mg daily.   ALLERGIES:  No known drug allergies.   REVIEW OF SYSTEMS:  Notable for intermittent skin rash of the lower  extremities, history of hemorrhoids, regular diet at home.  He requires  corrective lenses for near and far vision.  All other systems reviewed  and are negative.   PHYSICAL EXAMINATION:  GENERAL:  Very pleasant, somewhat overweight  gentleman in no acute distress, temperature is 98.6, blood pressure  115/60, heart rate 100 and irregular, respirations 20, and O2 saturation  100%.  HEENT:  Anicteric sclerae; normal lids and conjunctiva; EOMs full;  normal oral mucosa.  NECK:  No jugular venous distention; normal carotid upstrokes without  bruits.  ENDOCRINE:  No thyromegaly.  HEMATOPOIETIC:  No adenopathy.  SKIN:  No significant lesions.  LUNGS:  Decreased breath sound and rales at the left base.  CARDIAC:  Irregular rhythm; normal first and second heart sounds; modest  systolic murmur.  ABDOMEN:  Mildly distended; firm; nontender; no masses appreciated.  EXTREMITIES:  No edema; distal pulses intact.  NEUROLOGIC:  Symmetric strength and tone; normal cranial nerves.   EKG:  Atrial fibrillation with slightly elevated ventricular response;  nonspecific ST-T wave abnormality.   IMPRESSION:  Mr. Moxey has chronic atrial fibrillation of at least 1  years'  duration.  His only risk factor for thromboembolism is  hypertension conveying an annual risk of an event of approximately 3%.  In the setting, I agree with his other physicians that the use of  anticoagulation is marginal.  Of course, with his current event,  anticoagulation is out of the question for the time being.  I suspect he  can be managed in the future without full-dose warfarin.  He has  received fresh frozen plasma at a dose that is probably inadequate to  reverse his anticoagulation and no vitamin K.  A repeat INR will be  obtained.  He has been  off Coumadin for 2 or 3 days.  If he continues  to remain elevated, he will need additional FFP and will need vitamin K.  Transfusions have so far kept pace with blood loss.  Clinically, he does  not appear to be bleeding actively at present time.  Rate control is  currently adequate if not optimal.  We appreciate the request for  consultation and will be happy to assist with the Cardiology aspects of  this nice gentleman's care.  If he so desires, I will be happy to serve  this as cardiologist after he is discharged from this hospital.      Gerrit Friends. Dietrich Pates, MD, [facility]  Electronically Signed     RMR/MEDQ  D:  01/09/2008  T:  01/09/2008  Job:  [id number]

## 2010-10-13 NOTE — Assessment & Plan Note (Signed)
OFFICE VISIT   [name], [name]  DOB:  [birth year]                                       06/03/2009  EAVWU#:[id number]   The patient returns for continued followup regarding his small abdominal  aortic aneurysm and superior mesenteric artery aneurysm.  I saw him in  October of 2009 having been referred by Dr. Dietrich Pates with aneurysm being  detected on a CT scan following a retroperitoneal hematoma from a  spontaneous bleed from his left renal artery which was treated by  embolization.  At that time he had been on Coumadin for atrial  fibrillation which has now been discontinued.  He has had no symptoms of  postprandial pain, weight loss, changes of bowel habits or any other  abdominal symptoms since I last saw him.  He also has no significant  claudication symptoms.   CHRONIC MEDICAL PROBLEMS:  1. Hypertension.  2. Hyperlipidemia.  3. Spontaneous bleed left renal artery previous retroperitoneal      hematoma.  4. Chronic atrial fibrillation.  5. Mnire's disease.   FAMILY HISTORY:  Positive for hypertension in his mother and atrial fib  in a sister, stroke in his father and negative for diabetes.   SOCIAL HISTORY:  He is married, has two children, works in [employer].  Has not smoked since August of 2009.  Does not use alcohol.   REVIEW OF SYSTEMS:  Does have dyspnea on exertion, chronic atrial  fibrillation, mild discomfort in his legs with walking, dizziness,  nosebleeds.  All other systems are negative in review of systems.   PHYSICAL EXAM:  Blood pressure 113/76, heart rate is 86, respirations  14, temperature 97.9.  Generally he is a well-developed, well-nourished  obese male who is in no apparent distress, alert and oriented x3.  Neck  is supple, 3+ carotid pulses palpable.  No bruits are audible.  HEENT  exam reveals EOMs intact.  Otherwise normal.  Chest clear to  auscultation.  Cardiovascular reveals an irregular rhythm, no murmurs.  Abdomen  is obese.  No palpable masses.  He has 3+ femoral pulses  bilaterally.  Both feet are well-perfused.  Musculoskeletal exam reveals  no major deformities.  Neurologic exam is normal.  Skin no rashes.   Today I ordered a CT angiogram and I reviewed this and discussed the  findings with the radiologist.  He does have stable infrarenal aortic  aneurysm measuring about 3.7 cm in maximum diameter.  The superior  mesenteric artery aneurysm which is about 5-6 cm distal to its origin  involves many of the branches, has actually contracted and is smaller,  now measuring up to 11 mm in size compared to 18 mm a year ago.  There  is a lot of thrombus within the aneurysm and it is restricting flow but  there are no symptoms of mesenteric ischemia.   We will continue to follow these on an annual basis unless he develops  intestinal angina or other mesenteric ischemia type symptoms.  He will  return in 1 year with CT angiogram to further follow.     Quita Skye Hart [place], M.D.  Electronically Signed   JDL/MEDQ  D:  06/03/2009  T:  06/04/2009  Job:  [id number]

## 2010-10-13 NOTE — Discharge Summary (Signed)
NAME:  [name], [name] NO.:  [id number]   MEDICAL RECORD NO.:  [id number]          PATIENT TYPE:  INP   LOCATION:  A306                          FACILITY:  [facility]   PHYSICIAN:  Osvaldo Shipper, MD     DATE OF BIRTH:  Feb 25, 1941   DATE OF ADMISSION:  DATE OF DISCHARGE:  08/16/2009LH                               DISCHARGE SUMMARY   PRIMARY CARE PHYSICIAN:  The patient's PMD is located in [place] so  he is looking for a new physician in this community since he has moved  here recently.  He was also seen by Dr. Dietrich Pates from [facility] cardiology  and the patient will follow up with [facility] cardiology as well.   Please review H&P dictated by Dr. Edsel Petrin for details regarding  the patient's presenting illness.   <   DISCHARGE DIAGNOSES:  1. Left renal hematoma, status post angiogram and embolization.  2. Left lung pneumonia improved.  3. Tobacco abuse.  4. Atrial fibrillation stable.  5. Anemia secondary to blood loss, status post transfusion.  6. Possible COPD.  7. History of hypertension.   BRIEF HOSPITAL COURSE:  Briefly, this is a 70 year old Caucasian male  who presented to the hospital complaining of left flank pain.  The  patient underwent CT scan of his abdomen and pelvis which revealed an  acute intrapericardial and subcapsular hemorrhage of the left kidney.  The patient was on Coumadin for his atrial fibrillation.  The patient  was admitted to the hospital and into the intensive care unit.  He was  seen by Dr. Jerre Simon in consultation as well.  His hemoglobin at  presentation was 14.7 and dropped down to 10.5 and then 9 for which he  required blood transfusions.  His INR was 2.6 when he was admitted.  And  his Coumadin was held and FFPs were given to the patient.  The patient  underwent a repeat CAT scan on the following day which showed worsening  of the hematoma and extension into the retroperitoneum and the  extraperitoneal pelvis.  Dr. Jerre Simon  spoke with interventional radiology.  The patient underwent a renal angiogram followed by transcatheter  embolization of left renal artery branch.  This procedure was  successful.  The patient's pain slowly improved.  His hemoglobin  stabilized.  He was subsequently transferred out of the ICU and has done  well.  He will see Dr. Jerre Simon followup in 2 weeks and he may require a  CAT scan in about 4-6 weeks.  He has been asked not to take aspirin for  at least 2 weeks until he gets okay from Dr. Jerre Simon and the  cardiologist.  He has been told not to take Coumadin.  I think he should  probably not take it at least for 4-6 weeks, but I would defer to Dr.  Jerre Simon and to the cardiologist.   During the admission the patient was also short of breath.  He was found  to have left lower lobe pneumonia.  He was started on IV antibiotics.  The patient has slowly improved.  He was  also wheezing so he was started  on breathing treatments as well as on prednisone.  The patient is a  smoker.  The patient has slowly improved and now he is saturating 95% on  room air.  He has been ambulating with very less difficulty.  So overall  the patient has continued to show improvement.   He also had atrial fibrillation which remains stable.  His heart rate  was reasonably controlled.  His Coumadin was obviously held as discussed  earlier.  He was seen by [facility] who made recommendations.  He underwent an echocardiogram which showed a normal systolic function.  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  Nothing of concern was noted  on this echocardiogram.   His other medical issues continued to remain stable.  The patient has  been counseled on smoking cessation.  He tells me that he will not smoke  anymore.  I have offered him nicotine patches, but he has declined at  this time.   On the day of discharge the patient is feeling very well.  He says he  has improved significantly in the last couple of  days and he wants to go  home.  Vital signs show that he is afebrile.  Heart rate is in on under  reasonable control.  Blood pressure is 123/71 and his saturation is 95%  on room air.  Lungs are clear to auscultation bilaterally.  There is  some dullness to percussion in the left base.  His cardiovascular exam  as demonstrated irregular but otherwise unremarkable.  Abdomen is soft.  He still has some tenderness in the left flank area but nothing  significant.  Extremities did not show any edema.   So overall he is considered stable for discharge.   DISCHARGE MEDICATIONS:  1. Prednisone taper 40 mg daily for 3 days followed by 20 mg daily for      3 days followed by 10 mg daily for 3 days.  2. Levaquin 5 mg p.o. daily for 5 days.  3. Combivent MDI 2 puffs inhaled q.6 h.  4. Benicar 40 mg one half pill every day.  5. Meclizine 25 mg once a day.  6. Diltiazem CD 240 mg once a day.  7. Multivitamin once a day.  8. Nasonex each nostril at night time.  9. Trazodone 15 mg once a day.  10.Triamterene HCTZ 37.5/25 one half pill once a day.  11.Zyrtec 10 mg once a day.   He has been asked to discontinue his aspirin and Coumadin for now.   FOLLOW UP:  Follow up with Dr. Jerre Simon in 2 weeks.  [facility] in  2 weeks.  He will need to have a repeat CAT scan in 4-6 weeks which I  will defer to Dr. Jerre Simon to wait to arrange as an outpatient.   DIET:  Heart-healthy.   ACTIVITY:  Increase activity slowly.  I told him to not go back to work  for at least a week or two until he gets cleared by the cardiologist and  by Dr. Jerre Simon.   Total time on this discharge encounter about 35 minutes.   Please note the patient was seen by Dr. Jerre Simon, urologist, and Dr.  Dietrich Pates, cardiologist, in consultation during this admission.  He  underwent a renal angiogram at [facility] interventional radiology.  Apart from the CT scans he also underwent chest x-rays to follow up on  his pneumonia.       Osvaldo Shipper, MD  Electronically Signed     GK/MEDQ  D:  01/14/2008  T:  01/14/2008  Job:  [id number]   cc:   Ky Barban, M.D.  Fax: [phone]   Gerrit Friends. Dietrich Pates, MD, [facility]  [address]  [place], [place] [address]   Catalina Pizza, M.D.  Fax: [phone]

## 2010-10-20 ENCOUNTER — Ambulatory Visit (INDEPENDENT_AMBULATORY_CARE_PROVIDER_SITE_OTHER): Payer: MEDICARE | Admitting: Vascular Surgery

## 2010-10-20 ENCOUNTER — Ambulatory Visit
Admission: RE | Admit: 2010-10-20 | Discharge: 2010-10-20 | Disposition: A | Discharge status: Home / Self Care | Payer: MEDICARE | Source: Ambulatory Visit | Attending: Vascular Surgery | Admitting: Vascular Surgery

## 2010-10-20 DIAGNOSIS — I714 Abdominal aortic aneurysm, without rupture: Secondary | ICD-10-CM

## 2010-10-20 MED ORDER — IOHEXOL 350 MG/ML SOLN
100.0000 mL | Freq: Once | INTRAVENOUS | Status: AC | PRN
  Administered 2010-10-20: 100 mL via INTRAVENOUS

## 2010-10-21 NOTE — Assessment & Plan Note (Signed)
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  He denies any abdominal pain, postprandial pain, weight loss or change in his bowel habits.  He is able to ambulate up to 1 mile with mild dyspnea.  He continues to be stable from a medical standpoint.  CHRONIC MEDICAL PROBLEMS UNDER GOOD CONTROL: 1. Hypertension. 2. Hyperlipidemia. 3. Chronic atrial fibrillation.  No longer on Coumadin because of a     spontaneous bleed. 4. 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 5. Mnire's disease.  SOCIAL HISTORY:  He is married and has 2 children.  Is retired.  Has not smoked since 2009.  Does not use alcohol.  FAMILY HISTORY:  Positive for hypertension in his mother, atrial fib in a sister, stroke in his father.  Negative for diabetes.  REVIEW OF SYSTEMS:  Does have occasional dyspnea on exertion which is mild.  No chest pain.  Chronic atrial fibrillation.  Has decreased hearing acuity.  Dizziness related to his Mnire's with occasional nausea.  All other systems are negative in complete review of systems.  PHYSICAL EXAMINATION:  Blood pressure 127/82, heart rate 76, respirations 20.  General:  A well-developed and well-nourished male in no apparent distress, alert and oriented x3.  HEENT:  Normal for age. EOMs intact.  Lungs:  Clear to auscultation.  No rhonchi or wheezing. Cardiovascular:  Irregularly irregular rhythm.  Carotid pulses are 3+. No bruits.  Abdomen:  Obese.  No palpable masses.  Musculoskeletal: Free of major deformities.  Neurologic:  Normal.  Skin:  Free of rashes. Lower  extremity exam reveals 3+ femoral and popliteal pulses bilaterally.  Today I ordered a CT angiogram of the abdomen to look at his aortic and SMA aneurysms.  I reviewed this by computer.  Aneurysm in his aorta is stable at 3.7 to 4 cm in maximum diameter.  The superior mesenteric artery aneurysm continues to be fairly unimpressive with a maximum of 11 or 12 mm in diameter.  It appears that it could be restricting flow somewhat to the distal artery, but he denies any symptoms of intestinal angina and has never had any symptoms consistent with that diagnosis.  I think in general he is doing well.  We will follow him in the office with duplex scans for his aortic aneurysm.  I do not think any further follow-up of this SMA aneurysm is indicated because of the small size. If he develops symptoms of intestinal angina, we will reconsider this. He will return in 1 year for his duplex scan.    Quita Skye Hart [place], M.D. Electronically Signed  JDL/MEDQ  D:  10/20/2010  T:  10/21/2010  Job:  [id number]

## 2011-02-26 LAB — BASIC METABOLIC PANEL
BUN: 17
BUN: 18
BUN: 23
BUN: 23
BUN: 23
BUN: 24 — ABNORMAL HIGH
CO2: 27
CO2: 28
CO2: 29
CO2: 30
CO2: 33 — ABNORMAL HIGH
CO2: 33 — ABNORMAL HIGH
Calcium: 7.9 — ABNORMAL LOW
Calcium: 8 — ABNORMAL LOW
Calcium: 8.4
Calcium: 8.6
Calcium: 8.7
Calcium: 9.4
Chloride: 101
Chloride: 102
Chloride: 96
Chloride: 98
Chloride: 98
Chloride: 98
Creatinine, Ser: 1.25
Creatinine, Ser: 1.29
Creatinine, Ser: 1.3
Creatinine, Ser: 1.5
Creatinine, Ser: 1.57 — ABNORMAL HIGH
Creatinine, Ser: 1.6 — ABNORMAL HIGH
GFR calc Af Amer: 53 — ABNORMAL LOW
GFR calc Af Amer: 54 — ABNORMAL LOW
GFR calc Af Amer: 57 — ABNORMAL LOW
GFR calc Af Amer: >60
GFR calc Af Amer: >60
GFR calc Af Amer: >60
GFR calc non Af Amer: 44 — ABNORMAL LOW
GFR calc non Af Amer: 45 — ABNORMAL LOW
GFR calc non Af Amer: 47 — ABNORMAL LOW
GFR calc non Af Amer: 56 — ABNORMAL LOW
GFR calc non Af Amer: 56 — ABNORMAL LOW
GFR calc non Af Amer: 58 — ABNORMAL LOW
Glucose, Bld: 114 — ABNORMAL HIGH
Glucose, Bld: 114 — ABNORMAL HIGH
Glucose, Bld: 117 — ABNORMAL HIGH
Glucose, Bld: 126 — ABNORMAL HIGH
Glucose, Bld: 128 — ABNORMAL HIGH
Glucose, Bld: 170 — ABNORMAL HIGH
Potassium: 3.7
Potassium: 3.8
Potassium: 3.9
Potassium: 3.9
Potassium: 4
Potassium: 4.6
Sodium: 132 — ABNORMAL LOW
Sodium: 133 — ABNORMAL LOW
Sodium: 136
Sodium: 137
Sodium: 137
Sodium: 137

## 2011-02-26 LAB — CROSSMATCH
ABO/RH(D): A POS
Antibody Screen: NEGATIVE

## 2011-02-26 LAB — CULTURE, BLOOD (ROUTINE X 2)
Culture: NO GROWTH
Culture: NO GROWTH
Report Status: 8142009
Report Status: 8142009

## 2011-02-26 LAB — PREPARE FRESH FROZEN PLASMA

## 2011-02-26 LAB — DIFFERENTIAL
Basophils Absolute: 0
Basophils Absolute: 0
Basophils Absolute: 0
Basophils Absolute: 0
Basophils Absolute: 0
Basophils Absolute: 0
Basophils Absolute: 0
Basophils Absolute: 0
Basophils Relative: 0
Basophils Relative: 0
Basophils Relative: 0
Basophils Relative: 0
Basophils Relative: 0
Basophils Relative: 0
Basophils Relative: 0
Basophils Relative: 0
Eosinophils Absolute: 0
Eosinophils Absolute: 0
Eosinophils Absolute: 0
Eosinophils Absolute: 0.1
Eosinophils Absolute: 0.1
Eosinophils Absolute: 0.2
Eosinophils Absolute: 0.2
Eosinophils Absolute: 0.2
Eosinophils Relative: 0
Eosinophils Relative: 0
Eosinophils Relative: 0
Eosinophils Relative: 1
Eosinophils Relative: 1
Eosinophils Relative: 2
Eosinophils Relative: 2
Eosinophils Relative: 2
Lymphocytes Relative: 10 — ABNORMAL LOW
Lymphocytes Relative: 10 — ABNORMAL LOW
Lymphocytes Relative: 10 — ABNORMAL LOW
Lymphocytes Relative: 11 — ABNORMAL LOW
Lymphocytes Relative: 11 — ABNORMAL LOW
Lymphocytes Relative: 5 — ABNORMAL LOW
Lymphocytes Relative: 6 — ABNORMAL LOW
Lymphocytes Relative: 8 — ABNORMAL LOW
Lymphs Abs: 0.5 — ABNORMAL LOW
Lymphs Abs: 0.5 — ABNORMAL LOW
Lymphs Abs: 0.7
Lymphs Abs: 0.8
Lymphs Abs: 0.9
Lymphs Abs: 1.1
Lymphs Abs: 1.1
Lymphs Abs: 1.2
Monocytes Absolute: 0.6
Monocytes Absolute: 0.8
Monocytes Absolute: 0.9
Monocytes Absolute: 1
Monocytes Absolute: 1
Monocytes Absolute: 1.1 — ABNORMAL HIGH
Monocytes Absolute: 1.2 — ABNORMAL HIGH
Monocytes Absolute: 1.2 — ABNORMAL HIGH
Monocytes Relative: 10
Monocytes Relative: 11
Monocytes Relative: 11
Monocytes Relative: 11
Monocytes Relative: 12
Monocytes Relative: 12
Monocytes Relative: 6
Monocytes Relative: 7
Neutro Abs: 6.4
Neutro Abs: 6.8
Neutro Abs: 7.1
Neutro Abs: 7.3
Neutro Abs: 8 — ABNORMAL HIGH
Neutro Abs: 8.5 — ABNORMAL HIGH
Neutro Abs: 8.7 — ABNORMAL HIGH
Neutro Abs: 9.8 — ABNORMAL HIGH
Neutrophils Relative %: 76
Neutrophils Relative %: 77
Neutrophils Relative %: 77
Neutrophils Relative %: 78 — ABNORMAL HIGH
Neutrophils Relative %: 80 — ABNORMAL HIGH
Neutrophils Relative %: 82 — ABNORMAL HIGH
Neutrophils Relative %: 83 — ABNORMAL HIGH
Neutrophils Relative %: 89 — ABNORMAL HIGH

## 2011-02-26 LAB — HEMOGLOBIN AND HEMATOCRIT, BLOOD
HCT: 26.8 — ABNORMAL LOW
HCT: 28.9 — ABNORMAL LOW
HCT: 29.2 — ABNORMAL LOW
HCT: 29.4 — ABNORMAL LOW
HCT: 30.5 — ABNORMAL LOW
HCT: 33.1 — ABNORMAL LOW
HCT: 34.9 — ABNORMAL LOW
HCT: 35 — ABNORMAL LOW
HCT: 38 — ABNORMAL LOW
HCT: 38.1 — ABNORMAL LOW
Hemoglobin: 10 — ABNORMAL LOW
Hemoglobin: 10.2 — ABNORMAL LOW
Hemoglobin: 10.5 — ABNORMAL LOW
Hemoglobin: 11.3 — ABNORMAL LOW
Hemoglobin: 11.8 — ABNORMAL LOW
Hemoglobin: 12.1 — ABNORMAL LOW
Hemoglobin: 12.9 — ABNORMAL LOW
Hemoglobin: 13
Hemoglobin: 9.2 — ABNORMAL LOW
Hemoglobin: 9.9 — ABNORMAL LOW

## 2011-02-26 LAB — CBC
HCT: 28.9 — ABNORMAL LOW
HCT: 29.9 — ABNORMAL LOW
HCT: 33.4 — ABNORMAL LOW
HCT: 34.7 — ABNORMAL LOW
HCT: 35.3 — ABNORMAL LOW
HCT: 36.2 — ABNORMAL LOW
HCT: 37.7 — ABNORMAL LOW
HCT: 39.6
HCT: 40
HCT: 43.5
Hemoglobin: 10 — ABNORMAL LOW
Hemoglobin: 10.2 — ABNORMAL LOW
Hemoglobin: 11.5 — ABNORMAL LOW
Hemoglobin: 11.9 — ABNORMAL LOW
Hemoglobin: 12.1 — ABNORMAL LOW
Hemoglobin: 12.3 — ABNORMAL LOW
Hemoglobin: 12.9 — ABNORMAL LOW
Hemoglobin: 13.5
Hemoglobin: 13.5
Hemoglobin: 14.7
MCHC: 33.8
MCHC: 33.8
MCHC: 33.9
MCHC: 34.1
MCHC: 34.2
MCHC: 34.3
MCHC: 34.3
MCHC: 34.4
MCHC: 34.5
MCHC: 34.5
MCV: 89.8
MCV: 90.7
MCV: 90.8
MCV: 91
MCV: 91.1
MCV: 91.1
MCV: 91.3
MCV: 91.6
MCV: 91.8
MCV: 92
Platelets: 140 — ABNORMAL LOW
Platelets: 143 — ABNORMAL LOW
Platelets: 158
Platelets: 164
Platelets: 184
Platelets: 195
Platelets: 198
Platelets: 209
Platelets: 222
Platelets: 259
RBC: 3.22 — ABNORMAL LOW
RBC: 3.29 — ABNORMAL LOW
RBC: 3.68 — ABNORMAL LOW
RBC: 3.77 — ABNORMAL LOW
RBC: 3.88 — ABNORMAL LOW
RBC: 3.96 — ABNORMAL LOW
RBC: 4.14 — ABNORMAL LOW
RBC: 4.35
RBC: 4.36
RBC: 4.73
RDW: 13.6
RDW: 13.9
RDW: 14.4
RDW: 14.6
RDW: 14.6
RDW: 14.8
RDW: 14.8
RDW: 14.9
RDW: 14.9
RDW: 15.2
WBC: 10
WBC: 10.4
WBC: 10.9 — ABNORMAL HIGH
WBC: 11.8 — ABNORMAL HIGH
WBC: 17.7 — ABNORMAL HIGH
WBC: 8.4
WBC: 8.7
WBC: 8.9
WBC: 9.1
WBC: 9.8

## 2011-02-26 LAB — COMPREHENSIVE METABOLIC PANEL
ALT: 22
AST: 25
Albumin: 3.1 — ABNORMAL LOW
Alkaline Phosphatase: 31 — ABNORMAL LOW
BUN: 18
CO2: 32
Calcium: 8.2 — ABNORMAL LOW
Chloride: 94 — ABNORMAL LOW
Creatinine, Ser: 1.25
GFR calc Af Amer: >60
GFR calc non Af Amer: 58 — ABNORMAL LOW
Glucose, Bld: 122 — ABNORMAL HIGH
Potassium: 3.4 — ABNORMAL LOW
Sodium: 134 — ABNORMAL LOW
Total Bilirubin: 1.6 — ABNORMAL HIGH
Total Protein: 6

## 2011-02-26 LAB — URINALYSIS, ROUTINE W REFLEX MICROSCOPIC
Bilirubin Urine: NEGATIVE
Bilirubin Urine: NEGATIVE
Glucose, UA: NEGATIVE
Glucose, UA: NEGATIVE
Ketones, ur: NEGATIVE
Ketones, ur: NEGATIVE
Leukocytes, UA: NEGATIVE
Leukocytes, UA: NEGATIVE
Nitrite: NEGATIVE
Nitrite: NEGATIVE
Protein, ur: NEGATIVE
Protein, ur: NEGATIVE
Specific Gravity, Urine: 1.02
Specific Gravity, Urine: 1.025
Urobilinogen, UA: 0.2
Urobilinogen, UA: 0.2
pH: 6
pH: 8

## 2011-02-26 LAB — BLOOD GAS, ARTERIAL
Acid-Base Excess: 6.2 — ABNORMAL HIGH
Bicarbonate: 30 — ABNORMAL HIGH
O2 Content: 3
O2 Saturation: 94.5
Patient temperature: 37
TCO2: 26.3
pCO2 arterial: 42.2
pH, Arterial: 7.466 — ABNORMAL HIGH
pO2, Arterial: 68.6 — ABNORMAL LOW

## 2011-02-26 LAB — PROTIME-INR
INR: 1.2
INR: 2.1 — ABNORMAL HIGH
INR: 2.6 — ABNORMAL HIGH
INR: 2.7 — ABNORMAL HIGH
Prothrombin Time: 15.9 — ABNORMAL HIGH
Prothrombin Time: 24.9 — ABNORMAL HIGH
Prothrombin Time: 29.8 — ABNORMAL HIGH
Prothrombin Time: 30.9 — ABNORMAL HIGH

## 2011-02-26 LAB — MAGNESIUM: Magnesium: 1.9

## 2011-02-26 LAB — URINE CULTURE
Colony Count: NO GROWTH
Culture: NO GROWTH
Special Requests: NEGATIVE

## 2011-02-26 LAB — URINE MICROSCOPIC-ADD ON

## 2011-02-26 LAB — APTT
aPTT: 39 — ABNORMAL HIGH
aPTT: 41 — ABNORMAL HIGH
aPTT: 54 — ABNORMAL HIGH

## 2011-02-26 LAB — PREPARE RBC (CROSSMATCH)

## 2011-02-26 LAB — PHOSPHORUS: Phosphorus: 3.5

## 2011-02-26 LAB — TROPONIN I: Troponin I: <0.01

## 2011-02-26 LAB — ABO/RH: ABO/RH(D): A POS

## 2011-02-26 LAB — B-NATRIURETIC PEPTIDE (CONVERTED LAB)
Pro B Natriuretic peptide (BNP): 128 — ABNORMAL HIGH
Pro B Natriuretic peptide (BNP): 97

## 2011-02-26 LAB — CK: Total CK: 181

## 2011-06-22 ENCOUNTER — Inpatient Hospital Stay (HOSPITAL_COMMUNITY): Payer: Medicare Other

## 2011-06-22 ENCOUNTER — Encounter (HOSPITAL_COMMUNITY): Payer: Self-pay | Admitting: Physician Assistant

## 2011-06-22 ENCOUNTER — Inpatient Hospital Stay (HOSPITAL_COMMUNITY)
Admission: AD | Admit: 2011-06-22 | Discharge: 2011-06-24 | DRG: 311 | Disposition: A | Discharge status: Home / Self Care | Payer: Medicare Other | Source: Ambulatory Visit | Attending: Cardiovascular Disease | Admitting: Cardiovascular Disease

## 2011-06-22 DIAGNOSIS — H8109 Meniere's disease, unspecified ear: Secondary | ICD-10-CM | POA: Diagnosis present

## 2011-06-22 DIAGNOSIS — I739 Peripheral vascular disease, unspecified: Secondary | ICD-10-CM | POA: Diagnosis present

## 2011-06-22 DIAGNOSIS — I10 Essential (primary) hypertension: Secondary | ICD-10-CM | POA: Diagnosis present

## 2011-06-22 DIAGNOSIS — Z87891 Personal history of nicotine dependence: Secondary | ICD-10-CM

## 2011-06-22 DIAGNOSIS — R0600 Dyspnea, unspecified: Secondary | ICD-10-CM | POA: Diagnosis present

## 2011-06-22 DIAGNOSIS — I714 Abdominal aortic aneurysm, without rupture, unspecified: Secondary | ICD-10-CM | POA: Diagnosis present

## 2011-06-22 DIAGNOSIS — I4891 Unspecified atrial fibrillation: Secondary | ICD-10-CM | POA: Diagnosis present

## 2011-06-22 DIAGNOSIS — R079 Chest pain, unspecified: Secondary | ICD-10-CM

## 2011-06-22 DIAGNOSIS — Z79899 Other long term (current) drug therapy: Secondary | ICD-10-CM

## 2011-06-22 DIAGNOSIS — Z7982 Long term (current) use of aspirin: Secondary | ICD-10-CM

## 2011-06-22 DIAGNOSIS — I2489 Other forms of acute ischemic heart disease: Principal | ICD-10-CM | POA: Diagnosis present

## 2011-06-22 DIAGNOSIS — R0609 Other forms of dyspnea: Secondary | ICD-10-CM

## 2011-06-22 DIAGNOSIS — R0989 Other specified symptoms and signs involving the circulatory and respiratory systems: Secondary | ICD-10-CM

## 2011-06-22 DIAGNOSIS — I248 Other forms of acute ischemic heart disease: Principal | ICD-10-CM | POA: Diagnosis present

## 2011-06-22 DIAGNOSIS — E785 Hyperlipidemia, unspecified: Secondary | ICD-10-CM | POA: Diagnosis present

## 2011-06-22 HISTORY — DX: Pneumonia, unspecified organism: J18.9

## 2011-06-22 HISTORY — DX: Peripheral vascular disease, unspecified: I73.9

## 2011-06-22 HISTORY — DX: Hyperlipidemia, unspecified: E78.5

## 2011-06-22 HISTORY — DX: Unspecified osteoarthritis, unspecified site: M19.90

## 2011-06-22 LAB — CBC
HCT: 38.5 % — ABNORMAL LOW (ref 39.0–52.0)
Hemoglobin: 13 g/dL (ref 13.0–17.0)
MCH: 30.2 pg (ref 26.0–34.0)
MCHC: 33.8 g/dL (ref 30.0–36.0)
MCV: 89.5 fL (ref 78.0–100.0)
Platelets: 222 10*3/uL (ref 150–400)
RBC: 4.3 MIL/uL (ref 4.22–5.81)
RDW: 13.1 % (ref 11.5–15.5)
WBC: 8.8 10*3/uL (ref 4.0–10.5)

## 2011-06-22 LAB — COMPREHENSIVE METABOLIC PANEL
ALT: 14 U/L (ref 0–53)
AST: 14 U/L (ref 0–37)
Albumin: 3.3 g/dL — ABNORMAL LOW (ref 3.5–5.2)
Alkaline Phosphatase: 40 U/L (ref 39–117)
BUN: 19 mg/dL (ref 6–23)
CO2: 31 mEq/L (ref 19–32)
Calcium: 9.3 mg/dL (ref 8.4–10.5)
Chloride: 102 mEq/L (ref 96–112)
Creatinine, Ser: 1.09 mg/dL (ref 0.50–1.35)
GFR calc Af Amer: 79 mL/min — ABNORMAL LOW (ref >90)
GFR calc non Af Amer: 68 mL/min — ABNORMAL LOW (ref >90)
Glucose, Bld: 98 mg/dL (ref 70–99)
Potassium: 4.6 mEq/L (ref 3.5–5.1)
Sodium: 139 mEq/L (ref 135–145)
Total Bilirubin: 0.5 mg/dL (ref 0.3–1.2)
Total Protein: 6.9 g/dL (ref 6.0–8.3)

## 2011-06-22 LAB — PROTIME-INR
INR: 1.12 (ref 0.00–1.49)
Prothrombin Time: 14.6 seconds (ref 11.6–15.2)

## 2011-06-22 LAB — MAGNESIUM: Magnesium: 2.1 mg/dL (ref 1.5–2.5)

## 2011-06-22 LAB — CARDIAC PANEL(CRET KIN+CKTOT+MB+TROPI)
CK, MB: 1.7 ng/mL (ref 0.3–4.0)
Relative Index: INVALID (ref 0.0–2.5)
Total CK: 65 U/L (ref 7–232)
Troponin I: <0.3 ng/mL (ref <0.30)

## 2011-06-22 LAB — PRO B NATRIURETIC PEPTIDE: Pro B Natriuretic peptide (BNP): 310 pg/mL — ABNORMAL HIGH (ref 0–125)

## 2011-06-22 LAB — TSH: TSH: 3.752 u[IU]/mL (ref 0.350–4.500)

## 2011-06-22 MED ORDER — ONDANSETRON HCL 4 MG/2ML IJ SOLN: 4.0000 mg | Freq: Four times a day (QID) | INTRAMUSCULAR | Status: DC | PRN

## 2011-06-22 MED ORDER — OLMESARTAN MEDOXOMIL 20 MG PO TABS
20.0000 mg | ORAL_TABLET | Freq: Every day | ORAL | Status: DC
  Administered 2011-06-22 – 2011-06-24 (×3): 20 mg via ORAL
  Filled 2011-06-22 (×3): qty 1

## 2011-06-22 MED ORDER — LORATADINE 10 MG PO TABS
10.0000 mg | ORAL_TABLET | Freq: Every day | ORAL | Status: DC
  Administered 2011-06-22 – 2011-06-24 (×3): 10 mg via ORAL
  Filled 2011-06-22 (×3): qty 1

## 2011-06-22 MED ORDER — ONE-DAILY MULTI VITAMINS PO TABS: 1.0000 | ORAL_TABLET | Freq: Every day | ORAL | Status: DC

## 2011-06-22 MED ORDER — HEPARIN SOD (PORCINE) IN D5W 100 UNIT/ML IV SOLN
1500.0000 [IU]/h | INTRAVENOUS | Status: DC
  Administered 2011-06-22 – 2011-06-23 (×3): 1300 [IU]/h via INTRAVENOUS
  Filled 2011-06-22 (×3): qty 250

## 2011-06-22 MED ORDER — MECLIZINE HCL 25 MG PO TABS
25.0000 mg | ORAL_TABLET | Freq: Two times a day (BID) | ORAL | Status: DC | PRN
  Filled 2011-06-22: qty 1

## 2011-06-22 MED ORDER — ACETAMINOPHEN 325 MG PO TABS: 650.0000 mg | ORAL_TABLET | ORAL | Status: DC | PRN

## 2011-06-22 MED ORDER — ZOLPIDEM TARTRATE 5 MG PO TABS: 5.0000 mg | ORAL_TABLET | Freq: Every evening | ORAL | Status: DC | PRN

## 2011-06-22 MED ORDER — TAMSULOSIN HCL 0.4 MG PO CAPS
0.4000 mg | ORAL_CAPSULE | Freq: Every day | ORAL | Status: DC
  Administered 2011-06-22 – 2011-06-23 (×2): 0.4 mg via ORAL
  Filled 2011-06-22 (×3): qty 1

## 2011-06-22 MED ORDER — ASPIRIN 325 MG PO TABS
325.0000 mg | ORAL_TABLET | Freq: Every day | ORAL | Status: DC
  Administered 2011-06-22 – 2011-06-24 (×3): 325 mg via ORAL
  Filled 2011-06-22 (×3): qty 1

## 2011-06-22 MED ORDER — TRAZODONE HCL 50 MG PO TABS
50.0000 mg | ORAL_TABLET | Freq: Every day | ORAL | Status: DC
  Administered 2011-06-22 – 2011-06-23 (×2): 50 mg via ORAL
  Filled 2011-06-22 (×3): qty 1

## 2011-06-22 MED ORDER — NITROGLYCERIN 0.4 MG SL SUBL: 0.4000 mg | SUBLINGUAL_TABLET | SUBLINGUAL | Status: DC | PRN

## 2011-06-22 MED ORDER — MELOXICAM 15 MG PO TABS
15.0000 mg | ORAL_TABLET | Freq: Every day | ORAL | Status: DC
  Administered 2011-06-22 – 2011-06-24 (×3): 15 mg via ORAL
  Filled 2011-06-22 (×3): qty 1

## 2011-06-22 MED ORDER — ASPIRIN EC 81 MG PO TBEC: 81.0000 mg | DELAYED_RELEASE_TABLET | Freq: Every day | ORAL | Status: DC

## 2011-06-22 MED ORDER — DILTIAZEM HCL ER COATED BEADS 240 MG PO CP24
240.0000 mg | ORAL_CAPSULE | Freq: Every day | ORAL | Status: DC
  Administered 2011-06-22 – 2011-06-24 (×3): 240 mg via ORAL
  Filled 2011-06-22 (×3): qty 1

## 2011-06-22 MED ORDER — ALPRAZOLAM 0.25 MG PO TABS: 0.2500 mg | ORAL_TABLET | Freq: Two times a day (BID) | ORAL | Status: DC | PRN

## 2011-06-22 MED ORDER — HEPARIN BOLUS VIA INFUSION
5000.0000 [IU] | Freq: Once | INTRAVENOUS | Status: AC
  Administered 2011-06-22: 5000 [IU] via INTRAVENOUS
  Filled 2011-06-22: qty 5000

## 2011-06-22 MED ORDER — FLUTICASONE PROPIONATE 50 MCG/ACT NA SUSP
2.0000 | Freq: Two times a day (BID) | NASAL | Status: DC
  Administered 2011-06-22 – 2011-06-24 (×4): 2 via NASAL
  Filled 2011-06-22: qty 16

## 2011-06-22 MED ORDER — ROSUVASTATIN CALCIUM 20 MG PO TABS
20.0000 mg | ORAL_TABLET | Freq: Every day | ORAL | Status: DC
  Administered 2011-06-22 – 2011-06-23 (×2): 20 mg via ORAL
  Filled 2011-06-22 (×3): qty 1

## 2011-06-22 MED ORDER — ADULT MULTIVITAMIN W/MINERALS CH
1.0000 | ORAL_TABLET | Freq: Every day | ORAL | Status: DC
  Administered 2011-06-22 – 2011-06-24 (×3): 1 via ORAL
  Filled 2011-06-22 (×3): qty 1

## 2011-06-22 MED ORDER — TRIAMTERENE-HCTZ 37.5-25 MG PO TABS
0.5000 | ORAL_TABLET | Freq: Every day | ORAL | Status: DC
  Administered 2011-06-22 – 2011-06-24 (×3): 0.5 via ORAL
  Filled 2011-06-22 (×3): qty 0.5

## 2011-06-22 NOTE — H&P (Signed)
History and Physical  Patient ID: STEPEHN ECKARD MRN: [id number], SOB: [birth year] 71 y.o. Date of Encounter: 06/22/2011, 3:13 PM  Primary Physician: Dr. Dwana Melena Primary Cardiologist: Dr. Dietrich Pates remotely  Chief Complaint: chest pain/fatigue  HPI: 71 y/o M with no known hx of CAD but history of atrial fibrillation presented initially to PCP's office with general complaints of CP and fatigue and was sent over to [facility] for cardiology evaluation for concern for unstable angina. For the past 7-10 days, he has noticed an intermittent chest discomfort under his left rib cage that feels as though he has to burp. It is happening 3-4 times a day and has lasted as long as several hours. He also notes worsening SOB with any activity in the last 6 months - he was previously able to work in the yard without stopping but is now having to rest after an hour. He also notes increased diaphoresis occasionally associated with activity that is out of proportion to exertion level. These symptoms all also occur at rest at times. He can't attribute these symptoms to any particular precipitating factor or time of day. He recently completed a [place] to the [place] by car several weeks ago and denies any chest pain with inspiration or LEE. He did have what he feels to be a viral illness with cold symptoms and chills at that time but has since recovered. He is currently chest pain free. EKG shows atrial fib with no acute changes. No labs yet.  Past Medical History  Diagnosis Date  . 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 HTN (hypertension)   . Hyperlipidemia   . Chronic atrial fibrillation     Not on Coumadin secondary to hx of spontaneous kidney bleed treated by embolization in 2009  . Meniere's disease    No known hx of CAD. Denies prior cath. Remote stress test 2003.  TEE 02/2008 - SUMMARY - There was mild focal basal septal hypertrophy. - Aortic valve  thickness was mildly increased. - Minimal localized atheroma of the descending aorta.  - There was mild mitral valvular regurgitation. - The left atrium was mildly dilated. The left atrial appendage function was mildly reduced (mildly reduced emptying velocity). - The pulmonary veins were grossly normal.  2D echo 12/2007 - SUMMARY - Overall left ventricular systolic function was normal. There were no left ventricular regional wall motion abnormalities. - The aortic valve was mildly calcified. There  was trivial aortic valvular regurgitation. - There was mild aortic root dilatation. - There was mild mitral annular calcification. The effectiveorifice of mitral regurgitation by proximal isovelocity surface area was 0.06 cm^2. The volume of mitral regurgitation by proximal isovelocity surface area was 8 cc. - The left atrium was mildly dilated. - Borderline right ventricular hypertrophy.  Surgical History:  Past Surgical History  Procedure Date  . Pilonidal cyst excision   . Orif femur fracture      Home Meds: Medication Sig  aspirin 325 MG tablet Take 325 mg by mouth daily.  atorvastatin (LIPITOR) 40 MG tablet Take 20 mg by mouth daily.  cetirizine (ZYRTEC) 10 MG tablet Take 10 mg by mouth daily.  diltiazem (CARDIZEM CD) 240 MG 24 hr capsule Take 240 mg by mouth daily.  meclizine (ANTIVERT) 25 MG tablet Take 25 mg by mouth daily as needed.  meloxicam (MOBIC) 15 MG tablet Take 15 mg by mouth daily.  mometasone (NASONEX) 50 MCG/ACT nasal spray Place 2  sprays into the nose 2 (two) times daily.  Multiple Vitamin (MULTIVITAMIN) tablet Take 1 tablet by mouth daily.  Tamsulosin HCl (FLOMAX) 0.4 MG CAPS Take 0.4 mg by mouth at bedtime.  telmisartan (MICARDIS) 40 MG tablet Take 40 mg by mouth daily.  traZODone (DESYREL) 50 MG tablet Take 50 mg by mouth at bedtime.  triamterene-hydrochlorothiazide (MAXZIDE-25) 37.5-25 MG per tablet Take 0.5 tablets by mouth daily.   Allergies: No Known  Allergies  History   Social History  . Marital Status: Married   Social History Main Topics  . Smoking status: Former Smoker   Comment: Smoked for 40 years, quit 2009  . Alcohol Use: No  . Drug Use: No  . Sexually Active: Not on file    Family History  Problem Relation Age of Onset  . Hypertension Mother   . Atrial fibrillation Sister   . Stroke Father     Review of Systems: General: negative for chills, fever, night sweats. + for recent difficulty losing weight.  Cardiovascular: see above Dermatological: negative for rash Respiratory: negative for cough or wheezing Urologic: negative for hematuria Abdominal: negative for nausea, vomiting, diarrhea, bright red blood per rectum, melena, or hematemesis Neurologic: negative for visual changes, syncope. +hx of dizziness related to Meniere's All other systems reviewed and are otherwise negative except as noted above.  Labs: No labs yet Radiology/Studies:  No results found.   EKG: Atrial fibrillation 73bpm without acute changes  Physical Exam: Blood pressure 155/102, pulse 76, temperature 98.2 F (36.8 C), temperature source Oral, resp. rate 20, height 5\' 9"  (1.753 m), weight 210 lb (95.255 kg), SpO2 97.00%. General: Well developed, well nourished, in no acute distress. Head: Normocephalic, atraumatic, sclera non-icteric, no xanthomas, nares are without discharge.  Neck: Negative for carotid bruits. JVD not elevated. Lungs: Coarse bilaterally with crackles L base 1/3 way up. No wheezes or rhonchi. Breathing is unlabored. Heart: Irregularly irregular, controlled rate with S1 S2. No murmurs, rubs, or gallops appreciated. Abdomen: Soft, non-tender, rounded abdomen non-distended with normoactive bowel sounds. No hepatomegaly. No rebound/guarding. No obvious abdominal masses. Msk:  Strength and tone appear normal for age. Extremities: No clubbing or cyanosis. No edema.  Distal pedal pulses are 2+ and equal bilaterally. Neuro:  Alert and oriented X 3. Moves all extremities spontaneously. Psych:  Responds to questions appropriately with a normal affect.   ASSESSMENT AND PLAN:   1. Chest pain/dyspnea 2. Atrial fibrillation, controlled ventricular response, not on anticoagulation secondary to spontaneous bleed in 2009  There are some features of his story concerning for unstable angina including diaphoresis with exertion, although his CP/SOB itself is not always exertional. He has not noticed any increased palpitations. Exam reveals possible volume overload with L crackles so CHF is also a consideration as his last echo was 4 years ago. Would recommend admit for further evaluation - stat labs as well as CXR sent off to further guide treatment direction. I do think given risk factors of HTN & HL that he needs ischemic evaluation this hospitalization.  Signed, Ronie Spies PA-C 06/22/2011, 3:56 PM  Attending Note:   The patient was seen and examined.  Agree with assessment and plan as noted above.  Pt presents with several weeks of fatigue, dyspnea, chest pain ( worse with exertion).  He denies any leg edema - he has had some abdominal bloating.  He had a viral GI illness in December.    Labs so far reveal a Pro-BNP of 310 Initial CK/MB and troponin levels are  normal. CBC  - normal BMP - normal.  Echo has been ordered and is pending Stress myoview has been ordered. If we do not find any abnormality from the above tests, we proceed with pulmonary eval.  Alvia Grove., MD, [facility] 06/22/2011, 5:23 PM

## 2011-06-22 NOTE — Progress Notes (Signed)
ANTICOAGULATION CONSULT NOTE - Initial Consult  Pharmacy Consult for Heparin Indication: chest pain/ACS  No Known Allergies  Patient Measurements: Height: 5\' 9"  (175.3 cm) Weight: 210 lb (95.255 kg) IBW/kg (Calculated) : 70.7   Vital Signs: Temp: 98.2 F (36.8 C) (01/22 1500) Temp src: Oral (01/22 1500) BP: 155/102 mmHg (01/22 1500) Pulse Rate: 76  (01/22 1500)  Labs:  Basename 06/22/11 1628  HGB 13.0  HCT 38.5*  PLT 222  APTT --  LABPROT 14.6  INR 1.12  HEPARINUNFRC --  CREATININE --  CKTOTAL --  CKMB --  TROPONINI --   Estimated Creatinine Clearance: 53.7 ml/min (by C-G formula based on Cr of 1.5).  Medical History: Past Medical History  Diagnosis Date  . 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 HTN (hypertension)   . Hyperlipidemia   . Chronic atrial fibrillation     Not currently on Coumadin secondary to hx of spontaneous kidney bleed treated by embolization in 2009 (when patient was on Coumadin)  . Meniere's disease     Medications:  Scheduled:    . aspirin  325 mg Oral Daily  . diltiazem  240 mg Oral Daily  . fluticasone  2 spray Each Nare BID  . loratadine  10 mg Oral Daily  . meloxicam  15 mg Oral Daily  . mulitivitamin with minerals  1 tablet Oral Daily  . olmesartan  20 mg Oral Daily  . rosuvastatin  20 mg Oral q1800  . Tamsulosin HCl  0.4 mg Oral QHS  . traZODone  50 mg Oral QHS  . triamterene-hydrochlorothiazide  0.5 each Oral Daily  . DISCONTD: aspirin EC  81 mg Oral Daily  . DISCONTD: multivitamin  1 tablet Oral Daily    Assessment: 71 year old admitted with CP, to start heparin  Goal of Therapy:  Heparin level 0.3-0.7 units/ml   Plan:  1) Heparin 5000 units IV bolus x 1 2) Heparin drip at 1300 units / hr 3) Heparin level 6 hours after heparin starts 4) Daily heparin level / CBC  Thank you.   Elwin Sleight 06/22/2011,5:09 PM

## 2011-06-23 ENCOUNTER — Inpatient Hospital Stay (HOSPITAL_COMMUNITY): Payer: Medicare Other

## 2011-06-23 DIAGNOSIS — I517 Cardiomegaly: Secondary | ICD-10-CM

## 2011-06-23 DIAGNOSIS — R079 Chest pain, unspecified: Secondary | ICD-10-CM

## 2011-06-23 LAB — LIPID PANEL
Cholesterol: 122 mg/dL (ref 0–200)
HDL: 41 mg/dL (ref >39)
LDL Cholesterol: 64 mg/dL (ref 0–99)
Total CHOL/HDL Ratio: 3 RATIO
Triglycerides: 84 mg/dL (ref <150)
VLDL: 17 mg/dL (ref 0–40)

## 2011-06-23 LAB — CBC
HCT: 40.5 % (ref 39.0–52.0)
Hemoglobin: 13.7 g/dL (ref 13.0–17.0)
MCH: 30.6 pg (ref 26.0–34.0)
MCHC: 33.8 g/dL (ref 30.0–36.0)
MCV: 90.6 fL (ref 78.0–100.0)
Platelets: 205 10*3/uL (ref 150–400)
RBC: 4.47 MIL/uL (ref 4.22–5.81)
RDW: 13.1 % (ref 11.5–15.5)
WBC: 8.7 10*3/uL (ref 4.0–10.5)

## 2011-06-23 LAB — CARDIAC PANEL(CRET KIN+CKTOT+MB+TROPI)
CK, MB: 1.7 ng/mL (ref 0.3–4.0)
CK, MB: 1.4 ng/mL (ref 0.3–4.0)
Relative Index: INVALID (ref 0.0–2.5)
Relative Index: INVALID (ref 0.0–2.5)
Total CK: 66 U/L (ref 7–232)
Total CK: 54 U/L (ref 7–232)
Troponin I: <0.3 ng/mL (ref <0.30)
Troponin I: <0.3 ng/mL (ref <0.30)

## 2011-06-23 LAB — HEPARIN LEVEL (UNFRACTIONATED)
Heparin Unfractionated: 0.32 IU/mL (ref 0.30–0.70)
Heparin Unfractionated: 0.19 IU/mL — ABNORMAL LOW (ref 0.30–0.70)

## 2011-06-23 MED ORDER — FUROSEMIDE 10 MG/ML IJ SOLN
40.0000 mg | Freq: Once | INTRAMUSCULAR | Status: AC
  Administered 2011-06-23: 40 mg via INTRAVENOUS
  Filled 2011-06-23: qty 4

## 2011-06-23 MED ORDER — METOPROLOL SUCCINATE ER 25 MG PO TB24
25.0000 mg | ORAL_TABLET | Freq: Every day | ORAL | Status: DC
  Administered 2011-06-23 – 2011-06-24 (×2): 25 mg via ORAL
  Filled 2011-06-23 (×2): qty 1

## 2011-06-23 MED ORDER — TECHNETIUM TC 99M TETROFOSMIN IV KIT
30.0000 | PACK | Freq: Once | INTRAVENOUS | Status: AC | PRN
  Administered 2011-06-23: 30 via INTRAVENOUS

## 2011-06-23 MED ORDER — TECHNETIUM TC 99M TETROFOSMIN IV KIT
10.0000 | PACK | Freq: Once | INTRAVENOUS | Status: AC | PRN
  Administered 2011-06-23: 10 via INTRAVENOUS

## 2011-06-23 NOTE — Progress Notes (Signed)
ANTICOAGULATION CONSULT NOTE - Follow Up Consult  Pharmacy Consult for heparin Indication: chest pain/ACS  No Known Allergies  Patient Measurements: Height: 5\' 9"  (175.3 cm) Weight: 210 lb (95.255 kg) IBW/kg (Calculated) : 70.7   Vital Signs: Temp: 97.5 F (36.4 C) (01/23 1412) Temp src: Oral (01/23 1412) BP: 141/90 mmHg (01/23 1412) Pulse Rate: 75  (01/23 1412)  Labs:  Basename 06/23/11 1523 06/23/11 1515 06/23/11 0515 06/22/11 2337 06/22/11 2321 06/22/11 1628  HGB -- -- 13.7 -- -- 13.0  HCT -- -- 40.5 -- -- 38.5*  PLT -- -- 205 -- -- 222  APTT -- -- -- -- -- --  LABPROT -- -- -- -- -- 14.6  INR -- -- -- -- -- 1.12  HEPARINUNFRC -- 0.19* -- -- 0.32 --  CREATININE -- -- -- -- -- 1.09  CKTOTAL 54 -- -- 66 -- 65  CKMB 1.4 -- -- 1.7 -- 1.7  TROPONINI <0.30 -- -- <0.30 -- <0.30   Estimated Creatinine Clearance: 73.9 ml/min (by C-G formula based on Cr of 1.09).   Medications:  Scheduled:     . aspirin  325 mg Oral Daily  . diltiazem  240 mg Oral Daily  . fluticasone  2 spray Each Nare BID  . heparin  5,000 Units Intravenous Once  . loratadine  10 mg Oral Daily  . meloxicam  15 mg Oral Daily  . mulitivitamin with minerals  1 tablet Oral Daily  . olmesartan  20 mg Oral Daily  . rosuvastatin  20 mg Oral q1800  . Tamsulosin HCl  0.4 mg Oral QHS  . traZODone  50 mg Oral QHS  . triamterene-hydrochlorothiazide  0.5 each Oral Daily  . DISCONTD: aspirin EC  81 mg Oral Daily  . DISCONTD: multivitamin  1 tablet Oral Daily   Infusions:     . heparin 1,300 Units/hr (06/23/11 0754)    Assessment: 71yo male with subtherapeutic heparin level  Goal of Therapy:  Heparin level 0.3-0.7 units/ml   Plan:  Increase heparin drip tp 1500 units /hr.  Check HL in 8 hours  Woodfin Ganja PharmD 06/23/2011,4:22 PM

## 2011-06-23 NOTE — Progress Notes (Addendum)
Results of nuclear study and echo reviewed with Dr. Ladona Ridgel. It may be possible that elevated HR with exertion is contributing to patient's sensation of exercise intolerance. Per discussion, we will give 1 dose of Lasix today for possible volume overload (L rales heard on exam) & start on Toprol XL 25mg  daily. Dr. Ladona Ridgel will also be reviewing telemetry strips on 2000. Results of tests discussed with patient - we will keep him in house through tomorrow.  Dayna Dunn PA-C

## 2011-06-23 NOTE — Progress Notes (Signed)
*  PRELIMINARY RESULTS* Echocardiogram 2D Echocardiogram has been performed.  Glean Salen [facility] 06/23/2011, 1:33 PM

## 2011-06-23 NOTE — Progress Notes (Addendum)
Cardiology Progress Note Patient Name: [name] Date of Encounter: 06/23/2011, 10:32 AM     Subjective  Patient seen briefly in nuclear stress room.   Pre Test: No overnight events. Patient reported one episode of chest discomfort this morning after drinking cold water. Otherwise no cp or sob. VSS except for elevated BP this morning. EKG reveals atrial fibrillation, rate controlled. No acute ST/T changes.  During Test: VSS. EKG with mild ST depressions II, III, avF, V3-V6. Patient with sob, no cp. He was visibly dyspneic but able to carry on a conversation. Test stopped bc he reached his target heart rate and didn't feel that he could proceed to second stage.   Post Test: VSS. Patient breathing returning to baseline. No CP. EKG returning to baseline  Await interpretation of images.   Objective   Telemetry: Unable to review as patient was seen down in nuclear.  Medications:    . aspirin  325 mg Oral Daily  . diltiazem  240 mg Oral Daily  . fluticasone  2 spray Each Nare BID  . heparin  5,000 Units Intravenous Once  . loratadine  10 mg Oral Daily  . meloxicam  15 mg Oral Daily  . mulitivitamin with minerals  1 tablet Oral Daily  . olmesartan  20 mg Oral Daily  . rosuvastatin  20 mg Oral q1800  . Tamsulosin HCl  0.4 mg Oral QHS  . traZODone  50 mg Oral QHS  . triamterene-hydrochlorothiazide  0.5 each Oral Daily  . DISCONTD: aspirin EC  81 mg Oral Daily  . DISCONTD: multivitamin  1 tablet Oral Daily    Physical Exam: Temp:  [97.6 F (36.4 C)-98.2 F (36.8 C)] 97.6 F (36.4 C) (01/23 0525) Pulse Rate:  [76-87] 87  (01/23 1019) Resp:  [16-20] 16  (01/23 0525) BP: (132-159)/(90-102) 159/102 mmHg (01/23 1019) SpO2:  [97 %] 97 % (01/23 0525) Weight:  [210 lb (95.255 kg)] 210 lb (95.255 kg) (01/22 1500)  General: Well developed, well nourished, white male in no acute distress. Head: Normocephalic, atraumatic, sclera non-icteric, nares are without  discharge.  Neck: Supple. Negative for carotid bruits or JVD Lungs: Fine basilar rales L>R. No wheezes or rhonchi. Breathing is unlabored. Heart: Irregularly irregular S1 S2 without murmurs, rubs, or gallops.  Abdomen: Soft, non-tender, non-distended with normoactive bowel sounds. No rebound/guarding. No obvious abdominal masses. Msk:  Strength and tone appear normal for age. Extremities: Trace BLE edema. No clubbing or cyanosis. Distal pedal pulses are 2+ and equal bilaterally. Neuro: Alert and oriented X 3. Moves all extremities spontaneously. Psych:  Responds to questions appropriately with a normal affect.   Intake/Output Summary (Last 24 hours) at 06/23/11 1032 Last data filed at 06/23/11 0700  Gross per 24 hour  Intake    156 ml  Output   1100 ml  Net   -944 ml    Labs:  [facility] 06/22/11 1628  NA 139  K 4.6  CL 102  CO2 31  GLUCOSE 98  BUN 19  CREATININE 1.09  CALCIUM 9.3  MG 2.1  PHOS --    Basename 06/22/11 1628  AST 14  ALT 14  ALKPHOS 40  BILITOT 0.5  PROT 6.9  ALBUMIN 3.3*    Basename 06/23/11 0515 06/22/11 1628  WBC 8.7 8.8  HGB 13.7 13.0  HCT 40.5 38.5*  MCV 90.6 89.5  PLT 205 222    Basename 06/22/11 2337 06/22/11 1628  CKTOTAL 66 65  CKMB  1.7 1.7  TROPONINI <0.30 <0.30    Basename 06/23/11 0515  CHOL 122  HDL 41  LDLCALC 64  TRIG 84  CHOLHDL 3.0    Basename 06/22/11 1628  TSH 3.752  T4TOTAL --  T3FREE --  THYROIDAB --     Radiology/Studies:  X-ray Chest Pa And Lateral 06/22/2011 Findings: There is peribronchial thickening.  No consolidative process, pneumothorax or effusion.  Heart size normal.  IMPRESSION: Bronchitic change without focal process.      Assessment and Plan  71 y.o. male w/ PMHx significant for A. Fib, HTN no known history of CAD who presented to [facility]11 with complaints of chest pain, dyspnea, and fatigue.  1. Chest pain/dyspnea/fatigue: Patient feels well this morning. Cardiac  enzymes cycled and negative. Labs WNL. Will undergo stress myoview this morning. Further plans will depend on results of this test as well as 2D echocardiogram.  2. Hypertension: BP somewhat elevated this morning, particularly diastolic. Consider med titration.  3. Chronic A. Fib: Not on coumadin 2/2 to renal hemorrhage 2009. Rate controlled at this time.   Signed, HOPE, JESSICA PA-C  Cardiology Attending  Patient seen and examined and history reviewed. He has poor exercise tolerance and a low risk perfusion scan. He is likely deconditioned but also has an increased ventricular rate relatively modest exercise. Will uptitrate meds, add a diuretic and consider cardiopulmonary stress testing.

## 2011-06-23 NOTE — Progress Notes (Signed)
ANTICOAGULATION CONSULT NOTE - Follow Up Consult  Pharmacy Consult for heparin Indication: chest pain/ACS  No Known Allergies  Patient Measurements: Height: 5\' 9"  (175.3 cm) Weight: 210 lb (95.255 kg) IBW/kg (Calculated) : 70.7   Vital Signs: Temp: 98.1 F (36.7 C) (01/22 2027) Temp src: Oral (01/22 2027) BP: 146/94 mmHg (01/22 2027) Pulse Rate: 78  (01/22 2027)  Labs:  Basename 06/22/11 2337 06/22/11 2321 06/22/11 1628  HGB -- -- 13.0  HCT -- -- 38.5*  PLT -- -- 222  APTT -- -- --  LABPROT -- -- 14.6  INR -- -- 1.12  HEPARINUNFRC -- 0.32 --  CREATININE -- -- 1.09  CKTOTAL 66 -- 65  CKMB 1.7 -- 1.7  TROPONINI <0.30 -- <0.30   Estimated Creatinine Clearance: 73.9 ml/min (by C-G formula based on Cr of 1.09).   Medications:  Scheduled:    . aspirin  325 mg Oral Daily  . diltiazem  240 mg Oral Daily  . fluticasone  2 spray Each Nare BID  . heparin  5,000 Units Intravenous Once  . loratadine  10 mg Oral Daily  . meloxicam  15 mg Oral Daily  . mulitivitamin with minerals  1 tablet Oral Daily  . olmesartan  20 mg Oral Daily  . rosuvastatin  20 mg Oral q1800  . Tamsulosin HCl  0.4 mg Oral QHS  . traZODone  50 mg Oral QHS  . triamterene-hydrochlorothiazide  0.5 each Oral Daily  . DISCONTD: aspirin EC  81 mg Oral Daily  . DISCONTD: multivitamin  1 tablet Oral Daily   Infusions:    . heparin 1,300 Units/hr (06/22/11 1847)    Assessment: 71yo male therapeutic on heparin with initial dosing for CP.  Goal of Therapy:  Heparin level 0.3-0.7 units/ml   Plan:  Will continue heparin gtt at current rate and confirm stable with am labs.  Colleen Can PharmD BCPS 06/23/2011,1:15 AM

## 2011-06-24 LAB — CBC
HCT: 38.9 % — ABNORMAL LOW (ref 39.0–52.0)
Hemoglobin: 13.2 g/dL (ref 13.0–17.0)
MCH: 30.7 pg (ref 26.0–34.0)
MCHC: 33.9 g/dL (ref 30.0–36.0)
MCV: 90.5 fL (ref 78.0–100.0)
Platelets: 227 10*3/uL (ref 150–400)
RBC: 4.3 MIL/uL (ref 4.22–5.81)
RDW: 13.1 % (ref 11.5–15.5)
WBC: 9.4 10*3/uL (ref 4.0–10.5)

## 2011-06-24 LAB — HEPARIN LEVEL (UNFRACTIONATED): Heparin Unfractionated: 0.46 IU/mL (ref 0.30–0.70)

## 2011-06-24 MED ORDER — METOPROLOL SUCCINATE ER 25 MG PO TB24: 25.0000 mg | ORAL_TABLET | Freq: Every day | ORAL | Status: DC

## 2011-06-24 NOTE — Progress Notes (Signed)
Patient ID: [name], male   DOB: Dec 28, 1940, 71 y.o.   MRN: [id number] Subjective:  No chest pain or sob.  Objective:  Vital Signs in the last 24 hours: Temp:  [97.5 F (36.4 C)-98 F (36.7 C)] 98 F (36.7 C) (01/24 0459) Pulse Rate:  [66-134] 72  (01/24 0459) Resp:  [16-18] 16  (01/24 0459) BP: (100-168)/(74-102) 113/77 mmHg (01/24 0459) SpO2:  [95 %-96 %] 96 % (01/24 0459) Weight:  [92.9 kg (204 lb 12.9 oz)] 92.9 kg (204 lb 12.9 oz) (01/24 0459)  Intake/Output from previous day: 01/23 0701 - 01/24 0700 In: 240 [P.O.:240] Out: 650 [Urine:650] Intake/Output from this shift:    Physical Exam: Well appearing NAD HEENT: Unremarkable Neck:  No JVD, no thyromegally Lungs:  Clear with no wheezes. HEART:  IRegular rate rhythm, no murmurs, no rubs, no clicks Abd:  Flat, positive bowel sounds, no organomegally, no rebound, no guarding Ext:  2 plus pulses, no edema, no cyanosis, no clubbing Skin:  No rashes no nodules Neuro:  CN II through XII intact, motor grossly intact  Lab Results:  [facility] 06/24/11 06/23/11 0515  WBC 9.4 8.7  HGB 13.2 13.7  PLT 227 205    Basename 06/22/11 1628  NA 139  K 4.6  CL 102  CO2 31  GLUCOSE 98  BUN 19  CREATININE 1.09    Basename 06/23/11 1523 06/22/11 2337  TROPONINI <0.30 <0.30   Hepatic Function Panel  Basename 06/22/11 1628  PROT 6.9  ALBUMIN 3.3*  AST 14  ALT 14  ALKPHOS 40  BILITOT 0.5  BILIDIR --  IBILI --    Basename 06/23/11 0515  CHOL 122   No results found for this basename: PROTIME in the last 72 hours  Imaging: X-ray Chest Pa And Lateral  06/22/2011  *RADIOLOGY REPORT*  Clinical Data: Chest pain.  CHEST - 2 VIEW  Comparison: Single view chest 06/22/2011.  Findings: There is peribronchial thickening.  No consolidative process, pneumothorax or effusion.  Heart size normal.  IMPRESSION: Bronchitic change without focal process.  Original Report Authenticated By: Bernadene Bell. Maricela Curet, M.D.   Nm Myocar  Multi W/spect W/wall Motion / Ef  06/23/2011  *RADIOLOGY REPORT*  Clinical Data:  Chest pain  MYOCARDIAL IMAGING WITH SPECT (REST AND EXERCISE) GATED LEFT VENTRICULAR WALL MOTION STUDY LEFT VENTRICULAR EJECTION FRACTION  Technique:  Standard myocardial SPECT imaging was performed after resting intravenous injection of  10 mCi Tc-66m tetrofosmin. Subsequently, exercise tolerance test was performed by the patient under the supervision of the Cardiology staff.  At peak-stress, 30 mCi Tc-37m tetrofosminwas injected intravenously and standard myocardial SPECT imaging was performed.  Quantitative gated imaging was also performed to evaluate left ventricular wall motion, and estimate left ventricular ejection fraction.  Comparison:  None.  Findings: Treadmill stress reported separately.  Gated images showed normal wall motion, EF 66%.  There were no perfusion defects at rest or with stress.  IMPRESSION: Normal perfusion images with no evidence for ischemia or infarction.  Normal EF and wall motion.  Original Report Authenticated By: Gretta Began   Dg Chest Port 1 View  06/22/2011  *RADIOLOGY REPORT*  Clinical Data: Chest pain and shortness of breath  PORTABLE CHEST - 1 VIEW  Comparison: 01/11/2008  Findings: Heart size is normal.  There is no pleural effusion or pulmonary edema.  No airspace consolidation identified.  Review of the visualized osseous structures is unremarkable.  IMPRESSION:  1.  No active cardiopulmonary abnormalities.  Original Report Authenticated By: Ladona Ridgel  H. Bradly Chris, M.D.    Cardiac Studies:  Assessment/Plan:  1. Chest pain - negative stress myoview. I suspect supply/demand mismatch. We have added a beta blocker to help control his ventricular rate. 2.  Atrial fib - his ventricular rate is well controlled at rest. He may ultimately require PPM for brady-tachy but I would hope to avoid this for now. Will stop IV heparin. 3. HTN - his blood pressure is controlled. Will plan discharge with early  followup.  LOS: 2 days    Lewayne Bunting 06/24/2011, 8:23 AM

## 2011-06-24 NOTE — Progress Notes (Signed)
ANTICOAGULATION CONSULT NOTE - Follow Up Consult  Pharmacy Consult for heparin Indication: chest pain/ACS  No Known Allergies  Patient Measurements: Height: 5\' 9"  (175.3 cm) Weight: 210 lb (95.255 kg) IBW/kg (Calculated) : 70.7   Vital Signs: Temp: 97.9 F (36.6 C) (01/23 2052) Temp src: Oral (01/23 2052) BP: 100/74 mmHg (01/23 2052) Pulse Rate: 66  (01/23 2052)  Labs:  Alvira Philips 06/24/11 06/23/11 1523 06/23/11 1515 06/23/11 0515 06/22/11 2337 06/22/11 2321 06/22/11 1628  HGB 13.2 -- -- 13.7 -- -- --  HCT 38.9* -- -- 40.5 -- -- 38.5*  PLT 227 -- -- 205 -- -- 222  APTT -- -- -- -- -- -- --  LABPROT -- -- -- -- -- -- 14.6  INR -- -- -- -- -- -- 1.12  HEPARINUNFRC 0.46 -- 0.19* -- -- 0.32 --  CREATININE -- -- -- -- -- -- 1.09  CKTOTAL -- 54 -- -- 66 -- 65  CKMB -- 1.4 -- -- 1.7 -- 1.7  TROPONINI -- <0.30 -- -- <0.30 -- <0.30   Estimated Creatinine Clearance: 73.9 ml/min (by C-G formula based on Cr of 1.09).   Medications:  Scheduled:    . aspirin  325 mg Oral Daily  . diltiazem  240 mg Oral Daily  . fluticasone  2 spray Each Nare BID  . furosemide  40 mg Intravenous Once  . loratadine  10 mg Oral Daily  . meloxicam  15 mg Oral Daily  . metoprolol succinate  25 mg Oral Daily  . mulitivitamin with minerals  1 tablet Oral Daily  . olmesartan  20 mg Oral Daily  . rosuvastatin  20 mg Oral q1800  . Tamsulosin HCl  0.4 mg Oral QHS  . traZODone  50 mg Oral QHS  . triamterene-hydrochlorothiazide  0.5 each Oral Daily   Infusions:    . heparin 1,500 Units/hr (06/23/11 1715)    Assessment: 71yo male now therapeutic on heparin for CP.  Goal of Therapy:  Heparin level 0.3-0.7 units/ml   Plan:  Will continue heparin gtt at current rate and confirm stable with am labs.  Colleen Can PharmD BCPS 06/24/2011,2:21 AM

## 2011-06-24 NOTE — Progress Notes (Signed)
D/C instructions reviewed with pt and wife. Both verbalized understanding of all instructions. Pt given copy of instructions, and one script. Pt d/c'd via wheelchair with belongings with wife escorted by hospital volunteer.

## 2011-06-24 NOTE — Progress Notes (Signed)
Pt has had 3 pauses since midnight, the first pause was 2.6 seconds, the second one was 2.11 seconds and the most recent one was 2 seconds. The patient states that this has happened before and that his doctor is aware.   Azalee Weimer [employer]

## 2011-06-24 NOTE — Discharge Summary (Signed)
Discharge Summary   Patient ID: [name] MRN: [id number], DOB/AGE: 1941-01-01 71 y.o.  Primary MD: Dr. Dwana Melena Primary Cardiologist: Dr. Dietrich Pates   Admit date: 06/22/2011 D/C date:     06/24/2011     Primary Discharge Diagnoses:  1. Chest Pain/Dyspnea  - Thought to be due to demand ischemia related to atrial fibrillation with increased ventricular rate  - Cardiac enzymes negative x 3  - Negative stress myoview 06/23/11  - Echo on 06/23/11 revealed mod LVH, focal basal hypertrophy, normal LV fxn EF 60-65%  2. 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 HTN 2. Hyperlipidemia - LDL 64 on statin 3. 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 Meniere's disease   Allergies No Known Allergies  Diagnostic Studies/Procedures:   06/23/2011 - Stress Myoview Findings: Treadmill stress reported separately.  Gated images showed normal wall motion, EF 66%.  There were no perfusion defects at rest or with stress.  IMPRESSION: Normal perfusion images with no evidence for ischemia or infarction.  Normal EF and wall motion.    06/23/11 - 2D Echocardiogram Study Conclusions  - Left ventricle: Wall thickness was increased in a pattern of moderate LVH. There was focal basal hypertrophy. Systolic function was normal. The estimated ejection fraction was in the range of 60% to 65%. - Aortic valve: Trivial regurgitation.  History of Present Illness: 71 y/o M with no known hx of CAD but history of HTN, HLD, PVD and atrial fibrillation presented initially to PCP's office with general complaints of CP and fatigue and was sent over to [facility] for cardiology evaluation for concern for unstable angina.    Hospital Course: EKG showed atrial fib 73bpm with no acute changes. ProBNP was 310, initial cardiac enzyme was  normal, CXR was without active cardiopulmonary abnormalities. CBC and BMET were normal. He was admitted for further evaluation and treatment.  Cardiac enzymes were cycled and remained negative. 2D Echocardiogram was completed with findings as noted above. A Stress Myoview was performed on 06/23/11 and revealed normal perfusion images and a normal EF. During his hospitalization it was found that his ventricular rate was somewhat uncontrolled on his current medication regimen. It was thought that his increased ventricular rate with exertion was contributing to his sensation of exercise intolerance. He was initiated on Toprol XL 25mg  daily and continued on his home Diltiazem dose.Marland Kitchen He was able to ambulate without complaints of shortness of breath or chest pain and with heart rate maintaining in the 80s on day of discharge.  The patient was seen and examined by Dr. Ladona Ridgel who felt the patient was stable for discharge to home with plans for follow up as scheduled below.  Discharge Vitals: Blood pressure 113/77, pulse 72, temperature 98 F (36.7 C), temperature source Oral, resp. rate 16, height 5\' 9"  (1.753 m), weight 204 lb 12.9 oz (92.9 kg), SpO2 96.00%.  Labs: Component Value Date   WBC 9.4 06/24/2011   HGB 13.2 06/24/2011   HCT 38.9* 06/24/2011   MCV 90.5 06/24/2011   PLT 227 06/24/2011    Lab 06/22/11 1628  NA 139  K 4.6  CL 102  CO2 31  BUN 19  CREATININE 1.09  CALCIUM 9.3  PROT 6.9  BILITOT 0.5  ALKPHOS 40  ALT 14  AST 14  GLUCOSE 98  Basename 06/23/11 1523 06/22/11 2337 06/22/11 1628  CKTOTAL 54 66 65  CKMB 1.4 1.7 1.7  TROPONINI <0.30 <0.30 <0.30   Component Value Date   CHOL 122 06/23/2011   HDL 41 06/23/2011   LDLCALC 64 06/23/2011   TRIG 84 06/23/2011     06/22/2011 16:28  Pro B Natriuretic peptide (BNP) 310.0 (H)     06/22/2011 16:28  TSH 3.752   Discharge Medications   Medication List  As of 06/24/2011 10:38 AM   TAKE these medications         aspirin 325 MG tablet     Take 325 mg by mouth daily.      atorvastatin 40 MG tablet   Commonly known as: LIPITOR   Take 20 mg by mouth daily.      cetirizine 10 MG tablet   Commonly known as: ZYRTEC   Take 10 mg by mouth daily.      chlorpheniramine 4 MG tablet   Commonly known as: CHLOR-TRIMETON   Take 4 mg by mouth 2 (two) times daily as needed. For severe allergies      diltiazem 240 MG 24 hr capsule   Commonly known as: CARDIZEM CD   Take 240 mg by mouth daily.      meclizine 25 MG tablet   Commonly known as: ANTIVERT   Take 25 mg by mouth 2 (two) times daily as needed. For dizziness      meloxicam 15 MG tablet   Commonly known as: MOBIC   Take 15 mg by mouth daily.      metoprolol succinate 25 MG 24 hr tablet   Commonly known as: TOPROL-XL   Take 1 tablet (25 mg total) by mouth daily.      mometasone 50 MCG/ACT nasal spray   Commonly known as: NASONEX   Place 2 sprays into the nose 2 (two) times daily.      multivitamin tablet   Take 1 tablet by mouth daily.      Tamsulosin HCl 0.4 MG Caps   Commonly known as: FLOMAX   Take 0.4 mg by mouth at bedtime.      telmisartan 40 MG tablet   Commonly known as: MICARDIS   Take 40 mg by mouth daily.      traZODone 50 MG tablet   Commonly known as: DESYREL   Take 50 mg by mouth at bedtime.      triamterene-hydrochlorothiazide 37.5-25 MG per tablet   Commonly known as: MAXZIDE-25   Take 0.5 tablets by mouth daily.            Disposition   Discharge Orders    Future Appointments: Provider: Department: Dept Phone: Center:   07/12/2011 3:00 PM Gerrit Friends. Dietrich Pates, MD [facility] [phone] [facility]   10/26/2011 9:00 AM [facility] [phone] [facility]     Future Orders Please Complete By Expires   Diet - low sodium heart healthy      Increase activity slowly      Discharge instructions      Comments:   **PLEASE REMEMBER TO BRING ALL OF YOUR MEDICATIONS TO EACH OF YOUR FOLLOW-UP OFFICE VISITS.       Follow-up Information    Follow up with [place] Bing, MD on 07/12/2011. (3:00)    Contact information:   [facility] [address] [place] [address] [phone]       Follow up with Your Primary Care Provider. Schedule an appointment as soon as possible for a  visit in 2 weeks.          Outstanding Labs/Studies: None  Duration of Discharge Encounter: Greater than 30 minutes including physician and PA time.  Signed, Berwyn Bigley PA-C 06/24/2011, 10:38 AM

## 2011-06-24 NOTE — Progress Notes (Signed)
Pt ambulated in hallway with RN, tolerated well. Pt denied SOB or chest pain. HR remained in 80's during ambulation per monitor tech.

## 2011-07-09 ENCOUNTER — Encounter: Payer: Self-pay | Admitting: Cardiology

## 2011-07-09 DIAGNOSIS — I482 Chronic atrial fibrillation, unspecified: Secondary | ICD-10-CM | POA: Insufficient documentation

## 2011-07-09 DIAGNOSIS — H8109 Meniere's disease, unspecified ear: Secondary | ICD-10-CM | POA: Insufficient documentation

## 2011-07-09 DIAGNOSIS — E785 Hyperlipidemia, unspecified: Secondary | ICD-10-CM | POA: Insufficient documentation

## 2011-07-09 DIAGNOSIS — N2889 Other specified disorders of kidney and ureter: Secondary | ICD-10-CM | POA: Insufficient documentation

## 2011-07-09 DIAGNOSIS — I10 Essential (primary) hypertension: Secondary | ICD-10-CM | POA: Insufficient documentation

## 2011-07-12 ENCOUNTER — Ambulatory Visit (INDEPENDENT_AMBULATORY_CARE_PROVIDER_SITE_OTHER): Payer: Medicare Other | Admitting: Cardiology

## 2011-07-12 ENCOUNTER — Encounter: Payer: Self-pay | Admitting: Cardiology

## 2011-07-12 DIAGNOSIS — I482 Chronic atrial fibrillation, unspecified: Secondary | ICD-10-CM

## 2011-07-12 DIAGNOSIS — R079 Chest pain, unspecified: Secondary | ICD-10-CM

## 2011-07-12 DIAGNOSIS — I4891 Unspecified atrial fibrillation: Secondary | ICD-10-CM

## 2011-07-12 DIAGNOSIS — I10 Essential (primary) hypertension: Secondary | ICD-10-CM

## 2011-07-12 MED ORDER — PANTOPRAZOLE SODIUM 40 MG PO TBEC: 40.0000 mg | DELAYED_RELEASE_TABLET | Freq: Every day | ORAL | Status: DC

## 2011-07-12 NOTE — Progress Notes (Signed)
Patient ID: [name], male   DOB: 03/01/1941, 71 y.o.   MRN: [id number] HPI: Post-hospital office visit following recent brief stay at [facility] for evaluation of chest discomfort.  The patient reports approximally 6 weeks of intermittent mid substernal and right chest soreness without chest wall tenderness.  There is no associated dyspnea, but wife has noted difficulty breathing at times.  Pallor but not diaphoresis occurs with chest discomfort.  Symptoms last minutes to hours and fade spontaneously.  There is no clear relationship to time of day, body position, meals, activity or any other factor that the patient and his wife can identify.  He has tried Scientist, research (medical) without benefit.  No change in his medical therapy was recommended at the time of hospital discharge.  [name] has long-standing atrial fibrillation.  Anticoagulation was unstable in the past.  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  TEE was consistent with a relatively low risk for thromboembolism prompting treatment with aspirin.  He now has been found to have atherosclerosis of the abdominal aorta and is a few years older suggesting the issue of anticoagulation should be revisited.  Prior to Admission medications   Medication Sig Start Date End Date Taking? Authorizing Provider  aspirin 325 MG tablet Take 325 mg by mouth daily.   Yes Historical Provider, MD  atorvastatin (LIPITOR) 40 MG tablet Take 20 mg by mouth daily.   Yes Historical Provider, MD  cetirizine (ZYRTEC) 10 MG tablet Take 10 mg by mouth daily.   Yes Historical Provider, MD  chlorpheniramine (CHLOR-TRIMETON) 4 MG tablet Take 4 mg by mouth 2 (two) times daily as needed. For severe allergies   Yes Historical Provider, MD  diltiazem (CARDIZEM CD) 240 MG 24 hr capsule Take 240 mg by mouth daily.   Yes Historical Provider, MD  meclizine (ANTIVERT) 25 MG tablet Take 25 mg  by mouth 2 (two) times daily as needed. For dizziness   Yes Historical Provider, MD  meloxicam (MOBIC) 15 MG tablet Take 15 mg by mouth daily.   Yes Historical Provider, MD  metoprolol succinate (TOPROL-XL) 25 MG 24 hr tablet Take 1 tablet (25 mg total) by mouth daily. 06/24/11 06/23/12 Yes Jessica Hope, PA-C  mometasone (NASONEX) 50 MCG/ACT nasal spray Place 2 sprays into the nose 2 (two) times daily.   Yes Historical Provider, MD  Multiple Vitamin (MULTIVITAMIN) tablet Take 1 tablet by mouth daily.   Yes Historical Provider, MD  Tamsulosin HCl (FLOMAX) 0.4 MG CAPS Take 0.4 mg by mouth at bedtime.   Yes Historical Provider, MD  telmisartan (MICARDIS) 40 MG tablet Take 40 mg by mouth daily.   Yes Historical Provider, MD  traZODone (DESYREL) 50 MG tablet Take 50 mg by mouth at bedtime.   Yes Historical Provider, MD  triamcinolone cream (KENALOG) 0.1 % Apply topically 2 (two) times daily.   Yes Historical Provider, MD  triamterene-hydrochlorothiazide (MAXZIDE-25) 37.5-25 MG per tablet Take 0.5 tablets by mouth daily.   Yes Historical Provider, MD   No Known Allergies    Past medical history, social history, and family history reviewed and updated.  ROS: Denies orthopnea, PND, exertional dyspnea, pedal edema, palpitations, lightheadedness or syncope.  PHYSICAL EXAM: BP 121/82  Pulse 76  Resp 18  Ht 5\' 9"  (1.753 m)  Wt 97.977 kg (216 lb)  BMI 31.90 kg/m2  General-Well developed; no acute distress Body habitus-mildly overweight Neck-No JVD;  no carotid bruits Lungs-clear lung fields; resonant to percussion Cardiovascular-normal PMI; normal S1 and S2; modest systolic murmur Abdomen-normal bowel sounds; soft and non-tender without masses or organomegaly Musculoskeletal-No deformities, no cyanosis or clubbing Neurologic-Normal cranial nerves; symmetric strength and tone Skin-Warm, no significant lesions Extremities-distal pulses intact; trace edema  ASSESSMENT AND PLAN:  [place] Bing,  MD 07/12/2011 3:34 PM

## 2011-07-12 NOTE — Assessment & Plan Note (Signed)
Blood pressure control is excellent.  Whether substantial lowering of blood pressure with pharmacologic therapy reduces the risk of thromboembolism is unknown.

## 2011-07-12 NOTE — Patient Instructions (Signed)
**Note De-Identified Marieta Markov Obfuscation** Your physician has recommended you make the following change in your medication: start taking Protonix 40 mg daily, only take Mobic as needed and decrease Aspirin to 81 mg daily  Your physician recommends that you schedule a follow-up appointment in: 2 months

## 2011-07-12 NOTE — Assessment & Plan Note (Addendum)
Risks and benefits of anticoagulation with dabigatran discussed with patient and wife.  They're concerns about the television advertisements for class action lawsuit directed against this agent.  They will consider the information provided and returned for additional discussion at the next scheduled office visit.  He will continue to take low dose aspirin.  Mobic will be used on a PRN basis.

## 2011-07-17 DIAGNOSIS — R079 Chest pain, unspecified: Secondary | ICD-10-CM | POA: Insufficient documentation

## 2011-07-17 HISTORY — DX: Chest pain, unspecified: R07.9

## 2011-09-09 ENCOUNTER — Ambulatory Visit: Payer: Medicare Other | Admitting: Cardiology

## 2011-09-20 ENCOUNTER — Other Ambulatory Visit: Payer: Self-pay | Admitting: Cardiology

## 2011-09-20 ENCOUNTER — Encounter: Payer: Self-pay | Admitting: Cardiology

## 2011-09-20 ENCOUNTER — Ambulatory Visit (INDEPENDENT_AMBULATORY_CARE_PROVIDER_SITE_OTHER): Payer: Medicare Other | Admitting: Cardiology

## 2011-09-20 VITALS — BP 129/77 | HR 64 | Resp 16 | Ht 69.0 in | Wt 216.0 lb

## 2011-09-20 DIAGNOSIS — M79609 Pain in unspecified limb: Secondary | ICD-10-CM

## 2011-09-20 DIAGNOSIS — Z7901 Long term (current) use of anticoagulants: Secondary | ICD-10-CM

## 2011-09-20 DIAGNOSIS — R079 Chest pain, unspecified: Secondary | ICD-10-CM

## 2011-09-20 DIAGNOSIS — E785 Hyperlipidemia, unspecified: Secondary | ICD-10-CM

## 2011-09-20 DIAGNOSIS — M79605 Pain in left leg: Secondary | ICD-10-CM

## 2011-09-20 DIAGNOSIS — I4891 Unspecified atrial fibrillation: Secondary | ICD-10-CM

## 2011-09-20 DIAGNOSIS — I482 Chronic atrial fibrillation, unspecified: Secondary | ICD-10-CM

## 2011-09-20 DIAGNOSIS — M79606 Pain in leg, unspecified: Secondary | ICD-10-CM

## 2011-09-20 DIAGNOSIS — M79604 Pain in right leg: Secondary | ICD-10-CM

## 2011-09-20 DIAGNOSIS — I739 Peripheral vascular disease, unspecified: Secondary | ICD-10-CM

## 2011-09-20 MED ORDER — TRIAMTERENE-HCTZ 37.5-25 MG PO TABS: 0.5000 | ORAL_TABLET | Freq: Every day | ORAL | Status: DC

## 2011-09-20 MED ORDER — RIVAROXABAN 20 MG PO TABS: 1.0000 | ORAL_TABLET | Freq: Every day | ORAL | Status: DC

## 2011-09-20 NOTE — Progress Notes (Signed)
Patient ID: MALCOLM QUAST, male   DOB: [birth year], 71 y.o.   MRN: [id number]  HPI: Scheduled return visit for this very nice gentleman with long-standing atrial fibrillation, multiple cardiovascular risk factors without known coronary disease and a prior life-threatening renal hemorrhage while treated with therapeutic doses of warfarin.  Since his last visit, he has done quite well.  He denies chest discomfort or dyspnea.  He has not developed any new medical problems nor required urgent medical care.  He was admitted to hospital 3 months ago for chest discomfort at which time myocardial infarction was ruled out and stress testing was negative.  Exercise has been limited by discomfort in the legs below the knee and is by walking.  Symptoms are fairly prominently relieved with rest.  Prior to Admission medications   Medication Sig Start Date End Date Taking? Authorizing Provider  aspirin 81 MG tablet Take 81 mg by mouth daily.   Yes Historical Provider, MD  atorvastatin (LIPITOR) 40 MG tablet Take 20 mg by mouth daily.   Yes Historical Provider, MD  cetirizine (ZYRTEC) 10 MG tablet Take 10 mg by mouth daily.   Yes Historical Provider, MD  diltiazem (CARDIZEM CD) 240 MG 24 hr capsule Take 240 mg by mouth daily.   Yes Historical Provider, MD  meclizine (ANTIVERT) 25 MG tablet Take 25 mg by mouth 2 (two) times daily as needed. For dizziness   Yes Historical Provider, MD  meloxicam (MOBIC) 15 MG tablet Take 15 mg by mouth as needed.    Yes Historical Provider, MD  metoprolol succinate (TOPROL-XL) 25 MG 24 hr tablet Take 1 tablet (25 mg total) by mouth daily. 06/24/11 06/23/12 Yes Jessica A Hope, PA-C  mometasone (NASONEX) 50 MCG/ACT nasal spray Place 2 sprays into the nose 2 (two) times daily.   Yes Historical Provider, MD  Multiple Vitamin (MULTIVITAMIN) tablet Take 1 tablet by mouth daily.   Yes Historical Provider, MD  pantoprazole (PROTONIX) 40 MG tablet Take 1 tablet (40 mg total) by mouth daily. 07/12/11  07/11/12 Yes Kathlen [facility], MD  Tamsulosin HCl (FLOMAX) 0.4 MG CAPS Take 0.4 mg by mouth at bedtime.   Yes Historical Provider, MD  telmisartan (MICARDIS) 40 MG tablet Take 40 mg by mouth daily.   Yes Historical Provider, MD  traZODone (DESYREL) 50 MG tablet Take 50 mg by mouth at bedtime.   Yes Historical Provider, MD  triamcinolone cream (KENALOG) 0.1 % Apply topically 2 (two) times daily.   Yes Historical Provider, MD  triamterene-hydrochlorothiazide (MAXZIDE-25) 37.5-25 MG per tablet Take 0.5 each (0.5 tablets total) by mouth daily. 09/20/11  Yes Kathlen [facility], MD  chlorpheniramine (CHLOR-TRIMETON) 4 MG tablet Take 4 mg by mouth 2 (two) times daily as needed. For severe allergies    Historical Provider, MD  Rivaroxaban (XARELTO) 20 MG TABS Take 1 tablet by mouth daily. With evening meal 09/20/11   Kathlen [facility], MD    No Known Allergies    Past medical history, social history, and family history reviewed and updated.  ROS: Denies orthopnea, PND, pedal edema, palpitations, lightheadedness or syncope.  On aspirin alone, bleeding from small lacerations has been fairly profuse.  All other systems reviewed and are negative.  PHYSICAL EXAM: BP 129/77  Pulse 64  Resp 16  Ht 5\' 9"  (1.753 m)  Wt 97.977 kg (216 lb)  BMI 31.90 kg/m2  General-Well developed; no acute distress Body habitus-Overweight; weight gain of 12 pounds since 06/2011, but previous measurement was on a  hospital scale. Neck-No JVD; no carotid bruits Lungs-clear lung fields; resonant to percussion Cardiovascular-normal PMI; normal S1 and S2; irregular rhythm Abdomen-normal bowel sounds; soft and non-tender without masses or organomegaly Musculoskeletal-No deformities, no cyanosis or clubbing Neurologic-Normal cranial nerves; symmetric strength and tone Skin-Warm, no significant lesions Extremities-Decreased distal pulses, which are monophasic by Doppler; no edema  ASSESSMENT AND PLAN:  [place] Bing,  MD 09/20/2011 12:48 PM

## 2011-09-20 NOTE — Progress Notes (Deleted)
Name: [name]    DOB: 1941/03/28  Age: 71 y.o.  MR#: [id number]       PCP:  Dwana Melena, MD, MD      Insurance: @PAYORNAME @   CC:    Chief Complaint  Patient presents with  . Appointment    no complaints +med list    VS BP 129/77  Pulse 64  Resp 16  Ht 5\' 9"  (1.753 m)  Wt 216 lb (97.977 kg)  BMI 31.90 kg/m2  Weights Current Weight  09/20/11 216 lb (97.977 kg)  07/12/11 216 lb (97.977 kg)  06/24/11 204 lb 12.9 oz (92.9 kg)    Blood Pressure  BP Readings from Last 3 Encounters:  09/20/11 129/77  07/12/11 121/82  06/24/11 113/77     Admit date:  (Not on file) Last encounter with RMR:  07/12/2011   Allergy No Known Allergies  Current Outpatient Prescriptions  Medication Sig Dispense Refill  . aspirin 81 MG tablet Take 81 mg by mouth daily.      Marland Kitchen atorvastatin (LIPITOR) 40 MG tablet Take 20 mg by mouth daily.      . cetirizine (ZYRTEC) 10 MG tablet Take 10 mg by mouth daily.      Marland Kitchen diltiazem (CARDIZEM CD) 240 MG 24 hr capsule Take 240 mg by mouth daily.      . meclizine (ANTIVERT) 25 MG tablet Take 25 mg by mouth 2 (two) times daily as needed. For dizziness      . meloxicam (MOBIC) 15 MG tablet Take 15 mg by mouth as needed.       . metoprolol succinate (TOPROL-XL) 25 MG 24 hr tablet Take 1 tablet (25 mg total) by mouth daily.  30 tablet  6  . mometasone (NASONEX) 50 MCG/ACT nasal spray Place 2 sprays into the nose 2 (two) times daily.      . Multiple Vitamin (MULTIVITAMIN) tablet Take 1 tablet by mouth daily.      . pantoprazole (PROTONIX) 40 MG tablet Take 1 tablet (40 mg total) by mouth daily.  30 tablet  2  . Tamsulosin HCl (FLOMAX) 0.4 MG CAPS Take 0.4 mg by mouth at bedtime.      Marland Kitchen telmisartan (MICARDIS) 40 MG tablet Take 40 mg by mouth daily.      . traZODone (DESYREL) 50 MG tablet Take 50 mg by mouth at bedtime.      . triamcinolone cream (KENALOG) 0.1 % Apply topically 2 (two) times daily.      Marland Kitchen triamterene-hydrochlorothiazide (MAXZIDE-25) 37.5-25 MG per  tablet Take 0.5 tablets by mouth daily.      . chlorpheniramine (CHLOR-TRIMETON) 4 MG tablet Take 4 mg by mouth 2 (two) times daily as needed. For severe allergies        Discontinued Meds:   There are no discontinued medications.  Patient Active Problem List  Diagnoses  . Hypertension  . Hyperlipidemia  . Chronic atrial fibrillation  . Meniere's disease  . Renal hemorrhage, right  . Chest pain    LABS Admission on 06/22/2011, Discharged on 06/24/2011  Component Date Value  . Sodium 06/22/2011 139   . Potassium 06/22/2011 4.6   . Chloride 06/22/2011 102   . CO2 06/22/2011 31   . Glucose, Bld 06/22/2011 98   . BUN 06/22/2011 19   . Creatinine, Ser 06/22/2011 1.09   . Calcium 06/22/2011 9.3   . Total Protein 06/22/2011 6.9   . Albumin 06/22/2011 3.3*  . AST 06/22/2011 14   . ALT 06/22/2011  14   . Alkaline Phosphatase 06/22/2011 40   . Total Bilirubin 06/22/2011 0.5   . GFR calc non Af Amer 06/22/2011 68*  . GFR calc Af Amer 06/22/2011 79*  . WBC 06/22/2011 8.8   . RBC 06/22/2011 4.30   . Hemoglobin 06/22/2011 13.0   . HCT 06/22/2011 38.5*  . MCV 06/22/2011 89.5   . [facility] 06/22/2011 30.2   . MCHC 06/22/2011 33.8   . RDW 06/22/2011 13.1   . Platelets 06/22/2011 222   . Prothrombin Time 06/22/2011 14.6   . INR 06/22/2011 1.12   . Pro B Natriuretic peptid* 06/22/2011 310.0*  . Total CK 06/22/2011 65   . CK, MB 06/22/2011 1.7   . Troponin I 06/22/2011 <0.30   . Relative Index 06/22/2011 RELATIVE INDEX IS INVALID   . Total CK 06/22/2011 66   . CK, MB 06/22/2011 1.7   . Troponin I 06/22/2011 <0.30   . Relative Index 06/22/2011 RELATIVE INDEX IS INVALID   . TSH 06/22/2011 3.752   . Magnesium 06/22/2011 2.1   . Cholesterol 06/23/2011 122   . Triglycerides 06/23/2011 84   . HDL 06/23/2011 41   . Total CHOL/HDL Ratio 06/23/2011 3.0   . VLDL 06/23/2011 17   . LDL Cholesterol 06/23/2011 64   . Heparin Unfractionated 06/22/2011 0.32   . Total CK 06/23/2011 54   . CK,  MB 06/23/2011 1.4   . Troponin I 06/23/2011 <0.30   . Relative Index 06/23/2011 RELATIVE INDEX IS INVALID   . WBC 06/23/2011 8.7   . RBC 06/23/2011 4.47   . Hemoglobin 06/23/2011 13.7   . HCT 06/23/2011 40.5   . MCV 06/23/2011 90.6   . [facility] 06/23/2011 30.6   . MCHC 06/23/2011 33.8   . RDW 06/23/2011 13.1   . Platelets 06/23/2011 205   . Heparin Unfractionated 06/23/2011 0.19*  . Heparin Unfractionated 06/24/2011 0.46   . WBC 06/24/2011 9.4   . RBC 06/24/2011 4.30   . Hemoglobin 06/24/2011 13.2   . HCT 06/24/2011 38.9*  . MCV 06/24/2011 90.5   . [facility] 06/24/2011 30.7   . MCHC 06/24/2011 33.9   . RDW 06/24/2011 13.1   . Platelets 06/24/2011 227      Results for this Opt Visit:     Results for orders placed during the hospital encounter of 06/22/11  COMPREHENSIVE METABOLIC PANEL      Component Value Range   Sodium 139  135 - 145 (mEq/L)   Potassium 4.6  3.5 - 5.1 (mEq/L)   Chloride 102  96 - 112 (mEq/L)   CO2 31  19 - 32 (mEq/L)   Glucose, Bld 98  70 - 99 (mg/dL)   BUN 19  6 - 23 (mg/dL)   Creatinine, Ser 4.54  0.50 - 1.35 (mg/dL)   Calcium 9.3  8.4 - 09.8 (mg/dL)   Total Protein 6.9  6.0 - 8.3 (g/dL)   Albumin 3.3 (*) 3.5 - 5.2 (g/dL)   AST 14  0 - 37 (U/L)   ALT 14  0 - 53 (U/L)   Alkaline Phosphatase 40  39 - 117 (U/L)   Total Bilirubin 0.5  0.3 - 1.2 (mg/dL)   GFR calc non Af Amer 68 (*) >90 (mL/min)   GFR calc Af Amer 79 (*) >90 (mL/min)  CBC      Component Value Range   WBC 8.8  4.0 - 10.5 (K/uL)   RBC 4.30  4.22 - 5.81 (MIL/uL)   Hemoglobin 13.0  13.0 -  17.0 (g/dL)   HCT 16.1 (*) 09.6 - 52.0 (%)   MCV 89.5  78.0 - 100.0 (fL)   MCH 30.2  26.0 - 34.0 (pg)   MCHC 33.8  30.0 - 36.0 (g/dL)   RDW 04.5  40.9 - 81.1 (%)   Platelets 222  150 - 400 (K/uL)  PROTIME-INR      Component Value Range   Prothrombin Time 14.6  11.6 - 15.2 (seconds)   INR 1.12  0.00 - 1.49   PRO B NATRIURETIC PEPTIDE      Component Value Range   Pro B Natriuretic peptide (BNP) 310.0 (*) 0  - 125 (pg/mL)  CARDIAC PANEL(CRET KIN+CKTOT+MB+TROPI)      Component Value Range   Total CK 65  7 - 232 (U/L)   CK, MB 1.7  0.3 - 4.0 (ng/mL)   Troponin I <0.30  <0.30 (ng/mL)   Relative Index RELATIVE INDEX IS INVALID  0.0 - 2.5   CARDIAC PANEL(CRET KIN+CKTOT+MB+TROPI)      Component Value Range   Total CK 66  7 - 232 (U/L)   CK, MB 1.7  0.3 - 4.0 (ng/mL)   Troponin I <0.30  <0.30 (ng/mL)   Relative Index RELATIVE INDEX IS INVALID  0.0 - 2.5   TSH      Component Value Range   TSH 3.752  0.350 - 4.500 (uIU/mL)  MAGNESIUM      Component Value Range   Magnesium 2.1  1.5 - 2.5 (mg/dL)  LIPID PANEL      Component Value Range   Cholesterol 122  0 - 200 (mg/dL)   Triglycerides 84  <914 (mg/dL)   HDL 41  >78 (mg/dL)   Total CHOL/HDL Ratio 3.0     VLDL 17  0 - 40 (mg/dL)   LDL Cholesterol 64  0 - 99 (mg/dL)  HEPARIN LEVEL (UNFRACTIONATED)      Component Value Range   Heparin Unfractionated 0.32  0.30 - 0.70 (IU/mL)  CARDIAC PANEL(CRET KIN+CKTOT+MB+TROPI)      Component Value Range   Total CK 54  7 - 232 (U/L)   CK, MB 1.4  0.3 - 4.0 (ng/mL)   Troponin I <0.30  <0.30 (ng/mL)   Relative Index RELATIVE INDEX IS INVALID  0.0 - 2.5   CBC      Component Value Range   WBC 8.7  4.0 - 10.5 (K/uL)   RBC 4.47  4.22 - 5.81 (MIL/uL)   Hemoglobin 13.7  13.0 - 17.0 (g/dL)   HCT 29.5  62.1 - 30.8 (%)   MCV 90.6  78.0 - 100.0 (fL)   MCH 30.6  26.0 - 34.0 (pg)   MCHC 33.8  30.0 - 36.0 (g/dL)   RDW 65.7  84.6 - 96.2 (%)   Platelets 205  150 - 400 (K/uL)  HEPARIN LEVEL (UNFRACTIONATED)      Component Value Range   Heparin Unfractionated 0.19 (*) 0.30 - 0.70 (IU/mL)  HEPARIN LEVEL (UNFRACTIONATED)      Component Value Range   Heparin Unfractionated 0.46  0.30 - 0.70 (IU/mL)  CBC      Component Value Range   WBC 9.4  4.0 - 10.5 (K/uL)   RBC 4.30  4.22 - 5.81 (MIL/uL)   Hemoglobin 13.2  13.0 - 17.0 (g/dL)   HCT 95.2 (*) 84.1 - 52.0 (%)   MCV 90.5  78.0 - 100.0 (fL)   MCH 30.7  26.0 -  34.0 (pg)   MCHC 33.9  30.0 - 36.0 (  g/dL)   RDW 65.7  84.6 - 96.2 (%)   Platelets 227  150 - 400 (K/uL)    EKG Orders placed during the hospital encounter of 06/22/11  . EKG     Prior Assessment and Plan Problem List as of 09/20/2011          Cardiology Problems   Hypertension   Last Assessment & Plan Note   07/12/2011 Office Visit Signed 07/12/2011  5:13 PM by Kathlen Brunswick, MD    Blood pressure control is excellent.  Whether substantial lowering of blood pressure with pharmacologic therapy reduces the risk of thromboembolism is unknown.    Hyperlipidemia   Chronic atrial fibrillation   Last Assessment & Plan Note   07/12/2011 Office Visit Addendum 07/17/2011  2:52 PM by Kathlen Brunswick, MD    Risks and benefits of anticoagulation with dabigatran discussed with patient and wife.  They're concerns about the television advertisements for class action lawsuit directed against this agent.  They will consider the information provided and returned for additional discussion at the next scheduled office visit.  He will continue to take low dose aspirin.  Mobic will be used on a PRN basis.        Other   Meniere's disease   Renal hemorrhage, right   Chest pain       Imaging: No results found.   FRS Calculation: Score not calculated. Missing: Total Cholesterol

## 2011-09-20 NOTE — Patient Instructions (Addendum)
Your physician recommends that you schedule a follow-up appointment in: 6 months  Cholesterol is excellent  Weight loss  Your physician has requested that you have an ankle brachial index (ABI). During this test an ultrasound and blood pressure cuff are used to evaluate the arteries that supply the arms and legs with blood. Allow thirty minutes for this exam. There are no restrictions or special instructions.  Your physician recommends that you return for lab work in: 3 weeks (Due on or around May 13th)  Your physician has recommended you make the following change in your medication:  1 - START Xarelto 20 mg daily  Addendum to patient instructions post discharge:  Called to pt  2 - STOP Aspirin

## 2011-09-20 NOTE — Assessment & Plan Note (Signed)
No recurrent symptoms.  No evidence for significant cardiovascular disease despite multiple risk factors.

## 2011-09-20 NOTE — Assessment & Plan Note (Signed)
Recent values were excellent; atorvastatin will be continued.

## 2011-09-20 NOTE — Assessment & Plan Note (Addendum)
Heart rate is well controlled, and patient is asymptomatic.  Long discussion regarding risks and goals of anticoagulation resulted in patient agreeing to take Rivaroxaban, therapy with which will be initiated.  A PTT will be obtained in 3 weeks.

## 2011-09-20 NOTE — Assessment & Plan Note (Addendum)
Decreased distal pulses with symptoms compatible with claudication.  Formal ABIs will be obtained.

## 2011-10-04 ENCOUNTER — Other Ambulatory Visit: Payer: Self-pay | Admitting: Cardiology

## 2011-10-07 ENCOUNTER — Other Ambulatory Visit: Payer: Self-pay | Admitting: *Deleted

## 2011-10-07 DIAGNOSIS — Z7901 Long term (current) use of anticoagulants: Secondary | ICD-10-CM

## 2011-10-11 ENCOUNTER — Ambulatory Visit (HOSPITAL_COMMUNITY)
Admission: RE | Admit: 2011-10-11 | Discharge: 2011-10-11 | Disposition: A | Discharge status: Home / Self Care | Payer: Medicare Other | Source: Ambulatory Visit | Attending: Cardiology | Admitting: Cardiology

## 2011-10-11 DIAGNOSIS — I739 Peripheral vascular disease, unspecified: Secondary | ICD-10-CM | POA: Insufficient documentation

## 2011-10-11 DIAGNOSIS — M79609 Pain in unspecified limb: Secondary | ICD-10-CM | POA: Insufficient documentation

## 2011-10-11 DIAGNOSIS — M79606 Pain in leg, unspecified: Secondary | ICD-10-CM

## 2011-10-12 ENCOUNTER — Encounter: Payer: Self-pay | Admitting: *Deleted

## 2011-10-20 ENCOUNTER — Encounter: Payer: Self-pay | Admitting: *Deleted

## 2011-10-26 ENCOUNTER — Ambulatory Visit (INDEPENDENT_AMBULATORY_CARE_PROVIDER_SITE_OTHER): Payer: Medicare Other | Admitting: *Deleted

## 2011-10-26 DIAGNOSIS — I714 Abdominal aortic aneurysm, without rupture: Secondary | ICD-10-CM

## 2011-10-27 ENCOUNTER — Telehealth: Payer: Self-pay | Admitting: Cardiology

## 2011-10-27 NOTE — Telephone Encounter (Signed)
Please advise 

## 2011-10-27 NOTE — Telephone Encounter (Signed)
PT WAS TAKING XARELTO, HE HAS STOPPED BECAUSE IT IS MAKING HIS LEGS HURT WORSE THEN BEING ON ASPRIN. HE FEELS AWFUL ON IT. HE HAS GONE BACK TO TAKING ASPRIN.

## 2011-10-29 NOTE — Telephone Encounter (Signed)
Spoke with patient regarding recommendations per Dr Dietrich Pates.  States that he does not want to take anything, at this poin,t and wants to do some research on dabigatran, prior to trying as an alternative.  Advised him that he is at risk for a stroke or other averse effect from not taking one of the suggested medications.  Offered him an appointment to speak with a provider about options, which he declined at this time.  I will follow up with him by phone next week to re visit this.

## 2011-10-29 NOTE — Telephone Encounter (Signed)
Leg discomfort has not been identified as an adverse reaction to Xarelto.  If he cannot tolerate that medication, I would suggest dabigatran or apixaban.  As we have previously discussed, he is at risk for stroke and other thromboembolic events.

## 2011-10-29 NOTE — Telephone Encounter (Signed)
Message left for a return call regarding recomendations

## 2011-10-31 NOTE — Telephone Encounter (Signed)
Noted and agree with plan.

## 2011-11-01 ENCOUNTER — Other Ambulatory Visit: Payer: Self-pay | Admitting: *Deleted

## 2011-11-01 DIAGNOSIS — Z7901 Long term (current) use of anticoagulants: Secondary | ICD-10-CM

## 2011-11-01 NOTE — Procedures (Unsigned)
DUPLEX ULTRASOUND OF ABDOMINAL AORTA  INDICATION:  Followup AAA  HISTORY: Diabetes:  No Cardiac:  Atrial fibrillation Hypertension:  Yes Smoking:  Quit 2009 Connective Tissue Disorder: Family History:  No Previous Surgery:  No  DUPLEX EXAM:         AP (cm)                   TRANSVERSE (cm) Proximal             3.57 cm                   3.80 cm Mid                  3.14 cm                   3.14 cm Distal               2.82 cm                   2.95 cm Right Iliac          1.11 cm                   1.21 cm Left Iliac           Not visualized            cm  PREVIOUS:  Date:  None for comparison  AP:  TRANSVERSE:  IMPRESSION:  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  ___________________________________________ Quita Skye Hart [place], M.D.  EM/MEDQ  D:  10/26/2011  T:  10/26/2011  Job:  [id number]

## 2011-11-01 NOTE — Telephone Encounter (Signed)
Followed up with patient, who states he had a long discussion with Dr Margo Aye today and he will be going back on Xerelto as of today.  PTT and CBC will be postponed a week since he has not taken it for a week.

## 2011-11-19 ENCOUNTER — Other Ambulatory Visit: Payer: Self-pay | Admitting: *Deleted

## 2011-11-19 ENCOUNTER — Encounter: Payer: Self-pay | Admitting: *Deleted

## 2011-11-19 DIAGNOSIS — Z7901 Long term (current) use of anticoagulants: Secondary | ICD-10-CM

## 2012-03-22 ENCOUNTER — Ambulatory Visit: Payer: Medicare Other | Admitting: Cardiology

## 2012-03-24 ENCOUNTER — Encounter: Payer: Self-pay | Admitting: Cardiology

## 2012-03-24 ENCOUNTER — Ambulatory Visit (INDEPENDENT_AMBULATORY_CARE_PROVIDER_SITE_OTHER): Payer: Medicare Other | Admitting: Cardiology

## 2012-03-24 VITALS — BP 101/71 | HR 58 | Ht 69.0 in | Wt 221.1 lb

## 2012-03-24 DIAGNOSIS — R7301 Impaired fasting glucose: Secondary | ICD-10-CM

## 2012-03-24 DIAGNOSIS — I10 Essential (primary) hypertension: Secondary | ICD-10-CM

## 2012-03-24 DIAGNOSIS — I4891 Unspecified atrial fibrillation: Secondary | ICD-10-CM

## 2012-03-24 DIAGNOSIS — I739 Peripheral vascular disease, unspecified: Secondary | ICD-10-CM

## 2012-03-24 DIAGNOSIS — R079 Chest pain, unspecified: Secondary | ICD-10-CM

## 2012-03-24 DIAGNOSIS — N2889 Other specified disorders of kidney and ureter: Secondary | ICD-10-CM

## 2012-03-24 DIAGNOSIS — I714 Abdominal aortic aneurysm, without rupture, unspecified: Secondary | ICD-10-CM

## 2012-03-24 DIAGNOSIS — E785 Hyperlipidemia, unspecified: Secondary | ICD-10-CM

## 2012-03-24 DIAGNOSIS — R0602 Shortness of breath: Secondary | ICD-10-CM

## 2012-03-24 NOTE — Assessment & Plan Note (Signed)
Control of hyperlipidemia is relatively good, but patient may be experiencing significant adverse effects to his statin.  That medication will be discontinued for one month to determine if symptoms improve.

## 2012-03-24 NOTE — Progress Notes (Deleted)
Name: [name]    DOB: 1940/12/04  Age: 71 y.o.  MR#: [id number]       PCP:  Dwana Melena, MD      Insurance: @PAYORNAME @    MED LIST REVIEWED   CC:    Chief Complaint  Patient presents with  . Follow-up    VS BP 101/71  Pulse 58  Ht 5\' 9"  (1.753 m)  Wt 221 lb 1.9 oz (100.299 kg)  BMI 32.65 kg/m2  SpO2 98%  Weights Current Weight  03/24/12 221 lb 1.9 oz (100.299 kg)  09/20/11 216 lb (97.977 kg)  07/12/11 216 lb (97.977 kg)    Blood Pressure  BP Readings from Last 3 Encounters:  03/24/12 101/71  09/20/11 129/77  07/12/11 121/82     Admit date:  (Not on file) Last encounter with RMR:  10/27/2011   Allergy No Known Allergies  Current Outpatient Prescriptions  Medication Sig Dispense Refill  . aspirin 81 MG tablet Take 81 mg by mouth daily.      Marland Kitchen atorvastatin (LIPITOR) 40 MG tablet Take 20 mg by mouth daily.      . cetirizine (ZYRTEC) 10 MG tablet Take 10 mg by mouth daily.      Marland Kitchen diltiazem (CARDIZEM CD) 240 MG 24 hr capsule Take 240 mg by mouth daily.      . meclizine (ANTIVERT) 25 MG tablet Take 25 mg by mouth 2 (two) times daily as needed. For dizziness      . meloxicam (MOBIC) 15 MG tablet Take 15 mg by mouth as needed.       . metoprolol succinate (TOPROL-XL) 25 MG 24 hr tablet Take 1 tablet (25 mg total) by mouth daily.  30 tablet  6  . mometasone (NASONEX) 50 MCG/ACT nasal spray Place 2 sprays into the nose 2 (two) times daily.      . Multiple Vitamin (MULTIVITAMIN) tablet Take 1 tablet by mouth daily.      Marland Kitchen telmisartan (MICARDIS) 40 MG tablet Take 40 mg by mouth daily.      . traZODone (DESYREL) 50 MG tablet Take 50 mg by mouth at bedtime.      . triamcinolone cream (KENALOG) 0.1 % Apply topically 2 (two) times daily.      Marland Kitchen triamterene-hydrochlorothiazide (MAXZIDE-25) 37.5-25 MG per tablet Take 1 tablet by mouth daily.        Discontinued Meds:    Medications Discontinued During This Encounter  Medication Reason  . Rivaroxaban (XARELTO) 20 MG TABS  Error  . PROTONIX 40 MG tablet Error  . Tamsulosin HCl (FLOMAX) 0.4 MG CAPS Error  . chlorpheniramine (CHLOR-TRIMETON) 4 MG tablet Error  . triamterene-hydrochlorothiazide (MAXZIDE-25) 37.5-25 MG per tablet     Patient Active Problem List  Diagnosis  . Hypertension  . Hyperlipidemia  . Chronic atrial fibrillation  . Meniere's disease  . Renal hemorrhage, right  . Chest pain  . Peripheral vascular disease    LABS No visits with results within 3 Month(s) from this visit. Latest known visit with results is:  Admission on 06/22/2011, Discharged on 06/24/2011  Component Date Value  . Sodium 06/22/2011 139   . Potassium 06/22/2011 4.6   . Chloride 06/22/2011 102   . CO2 06/22/2011 31   . Glucose, Bld 06/22/2011 98   . BUN 06/22/2011 19   . Creatinine, Ser 06/22/2011 1.09   . Calcium 06/22/2011 9.3   . Total Protein 06/22/2011 6.9   . Albumin 06/22/2011 3.3*  . AST 06/22/2011 14   .  ALT 06/22/2011 14   . Alkaline Phosphatase 06/22/2011 40   . Total Bilirubin 06/22/2011 0.5   . GFR calc non Af Amer 06/22/2011 68*  . GFR calc Af Amer 06/22/2011 79*  . WBC 06/22/2011 8.8   . RBC 06/22/2011 4.30   . Hemoglobin 06/22/2011 13.0   . HCT 06/22/2011 38.5*  . MCV 06/22/2011 89.5   . [facility] 06/22/2011 30.2   . MCHC 06/22/2011 33.8   . RDW 06/22/2011 13.1   . Platelets 06/22/2011 222   . Prothrombin Time 06/22/2011 14.6   . INR 06/22/2011 1.12   . Pro B Natriuretic peptid* 06/22/2011 310.0*  . Total CK 06/22/2011 65   . CK, MB 06/22/2011 1.7   . Troponin I 06/22/2011 <0.30   . Relative Index 06/22/2011 RELATIVE INDEX IS INVALID   . Total CK 06/22/2011 66   . CK, MB 06/22/2011 1.7   . Troponin I 06/22/2011 <0.30   . Relative Index 06/22/2011 RELATIVE INDEX IS INVALID   . TSH 06/22/2011 3.752   . Magnesium 06/22/2011 2.1   . Cholesterol 06/23/2011 122   . Triglycerides 06/23/2011 84   . HDL 06/23/2011 41   . Total CHOL/HDL Ratio 06/23/2011 3.0   . VLDL 06/23/2011 17   . LDL  Cholesterol 06/23/2011 64   . Heparin Unfractionated 06/22/2011 0.32   . Total CK 06/23/2011 54   . CK, MB 06/23/2011 1.4   . Troponin I 06/23/2011 <0.30   . Relative Index 06/23/2011 RELATIVE INDEX IS INVALID   . WBC 06/23/2011 8.7   . RBC 06/23/2011 4.47   . Hemoglobin 06/23/2011 13.7   . HCT 06/23/2011 40.5   . MCV 06/23/2011 90.6   . [facility] 06/23/2011 30.6   . MCHC 06/23/2011 33.8   . RDW 06/23/2011 13.1   . Platelets 06/23/2011 205   . Heparin Unfractionated 06/23/2011 0.19*  . Heparin Unfractionated 06/24/2011 0.46   . WBC 06/24/2011 9.4   . RBC 06/24/2011 4.30   . Hemoglobin 06/24/2011 13.2   . HCT 06/24/2011 38.9*  . MCV 06/24/2011 90.5   . [facility] 06/24/2011 30.7   . MCHC 06/24/2011 33.9   . RDW 06/24/2011 13.1   . Platelets 06/24/2011 227      Results for this Opt Visit:     Results for orders placed during the hospital encounter of 06/22/11  COMPREHENSIVE METABOLIC PANEL      Component Value Range   Sodium 139  135 - 145 mEq/L   Potassium 4.6  3.5 - 5.1 mEq/L   Chloride 102  96 - 112 mEq/L   CO2 31  19 - 32 mEq/L   Glucose, Bld 98  70 - 99 mg/dL   BUN 19  6 - 23 mg/dL   Creatinine, Ser 1.19  0.50 - 1.35 mg/dL   Calcium 9.3  8.4 - 14.7 mg/dL   Total Protein 6.9  6.0 - 8.3 g/dL   Albumin 3.3 (*) 3.5 - 5.2 g/dL   AST 14  0 - 37 U/L   ALT 14  0 - 53 U/L   Alkaline Phosphatase 40  39 - 117 U/L   Total Bilirubin 0.5  0.3 - 1.2 mg/dL   GFR calc non Af Amer 68 (*) >90 mL/min   GFR calc Af Amer 79 (*) >90 mL/min  CBC      Component Value Range   WBC 8.8  4.0 - 10.5 K/uL   RBC 4.30  4.22 - 5.81 MIL/uL   Hemoglobin 13.0  13.0 - 17.0 g/dL   HCT 78.2 (*) 95.6 - 21.3 %   MCV 89.5  78.0 - 100.0 fL   MCH 30.2  26.0 - 34.0 pg   MCHC 33.8  30.0 - 36.0 g/dL   RDW 08.6  57.8 - 46.9 %   Platelets 222  150 - 400 K/uL  PROTIME-INR      Component Value Range   Prothrombin Time 14.6  11.6 - 15.2 seconds   INR 1.12  0.00 - 1.49  PRO B NATRIURETIC PEPTIDE      Component  Value Range   Pro B Natriuretic peptide (BNP) 310.0 (*) 0 - 125 pg/mL  CARDIAC PANEL(CRET KIN+CKTOT+MB+TROPI)      Component Value Range   Total CK 65  7 - 232 U/L   CK, MB 1.7  0.3 - 4.0 ng/mL   Troponin I <0.30  <0.30 ng/mL   Relative Index RELATIVE INDEX IS INVALID  0.0 - 2.5  CARDIAC PANEL(CRET KIN+CKTOT+MB+TROPI)      Component Value Range   Total CK 66  7 - 232 U/L   CK, MB 1.7  0.3 - 4.0 ng/mL   Troponin I <0.30  <0.30 ng/mL   Relative Index RELATIVE INDEX IS INVALID  0.0 - 2.5  TSH      Component Value Range   TSH 3.752  0.350 - 4.500 uIU/mL  MAGNESIUM      Component Value Range   Magnesium 2.1  1.5 - 2.5 mg/dL  LIPID PANEL      Component Value Range   Cholesterol 122  0 - 200 mg/dL   Triglycerides 84  <629 mg/dL   HDL 41  >52 mg/dL   Total CHOL/HDL Ratio 3.0     VLDL 17  0 - 40 mg/dL   LDL Cholesterol 64  0 - 99 mg/dL  HEPARIN LEVEL (UNFRACTIONATED)      Component Value Range   Heparin Unfractionated 0.32  0.30 - 0.70 IU/mL  CARDIAC PANEL(CRET KIN+CKTOT+MB+TROPI)      Component Value Range   Total CK 54  7 - 232 U/L   CK, MB 1.4  0.3 - 4.0 ng/mL   Troponin I <0.30  <0.30 ng/mL   Relative Index RELATIVE INDEX IS INVALID  0.0 - 2.5  CBC      Component Value Range   WBC 8.7  4.0 - 10.5 K/uL   RBC 4.47  4.22 - 5.81 MIL/uL   Hemoglobin 13.7  13.0 - 17.0 g/dL   HCT 84.1  32.4 - 40.1 %   MCV 90.6  78.0 - 100.0 fL   MCH 30.6  26.0 - 34.0 pg   MCHC 33.8  30.0 - 36.0 g/dL   RDW 02.7  25.3 - 66.4 %   Platelets 205  150 - 400 K/uL  HEPARIN LEVEL (UNFRACTIONATED)      Component Value Range   Heparin Unfractionated 0.19 (*) 0.30 - 0.70 IU/mL  HEPARIN LEVEL (UNFRACTIONATED)      Component Value Range   Heparin Unfractionated 0.46  0.30 - 0.70 IU/mL  CBC      Component Value Range   WBC 9.4  4.0 - 10.5 K/uL   RBC 4.30  4.22 - 5.81 MIL/uL   Hemoglobin 13.2  13.0 - 17.0 g/dL   HCT 40.3 (*) 47.4 - 25.9 %   MCV 90.5  78.0 - 100.0 fL   MCH 30.7  26.0 - 34.0 pg   MCHC  33.9  30.0 - 36.0 g/dL  RDW 13.1  11.5 - 15.5 %   Platelets 227  150 - 400 K/uL    EKG Orders placed during the hospital encounter of 06/22/11  . EKG     Prior Assessment and Plan Problem List as of 03/24/2012            Cardiology Problems   Hypertension   Last Assessment & Plan Note   07/12/2011 Office Visit Signed 07/12/2011  5:13 PM by Kathlen Brunswick, MD    Blood pressure control is excellent.  Whether substantial lowering of blood pressure with pharmacologic therapy reduces the risk of thromboembolism is unknown.    Hyperlipidemia   Last Assessment & Plan Note   09/20/2011 Office Visit Signed 09/20/2011 12:59 PM by Kathlen Brunswick, MD    Recent values were excellent; atorvastatin will be continued.    Chronic atrial fibrillation   Last Assessment & Plan Note   09/20/2011 Office Visit Addendum 09/24/2011  3:39 PM by Kathlen Brunswick, MD    Heart rate is well controlled, and patient is asymptomatic.  Long discussion regarding risks and goals of anticoagulation resulted in patient agreeing to take Rivaroxaban, therapy with which will be initiated.  A PTT will be obtained in 3 weeks.    Peripheral vascular disease   Last Assessment & Plan Note   09/20/2011 Office Visit Addendum 09/24/2011  3:39 PM by Kathlen Brunswick, MD    Decreased distal pulses with symptoms compatible with claudication.  Formal ABIs will be obtained.      Other   Meniere's disease   Renal hemorrhage, right   Chest pain   Last Assessment & Plan Note   09/20/2011 Office Visit Signed 09/20/2011 12:57 PM by Kathlen Brunswick, MD    No recurrent symptoms.  No evidence for significant cardiovascular disease despite multiple risk factors.        Imaging: No results found.   FRS Calculation: Score not calculated. Missing: Total Cholesterol

## 2012-03-24 NOTE — Assessment & Plan Note (Addendum)
Blood pressure control has been excellent for the past 12 months.  Current medications will be continued while in electrolytes and renal function are followed with serial laboratory testing.

## 2012-03-24 NOTE — Patient Instructions (Signed)
Your physician recommends that you schedule a follow-up appointment in: 1 month  Your physician recommends that you return for lab work in: Within the week  Your physician has recommended you make the following change in your medication:  STOP Lipitor

## 2012-03-24 NOTE — Progress Notes (Signed)
Patient ID: [name], male   DOB: [birth year], 71 y.o.   MRN: [id number]  HPI: Scheduled return visit for this very nice gentleman with chronic atrial fibrillation, hypertension and hyperlipidemia.  Since his last visit, he has done generally well.  He exercises at a gym on a nearly daily basis, but experiences leg discomfort and weakness if he extends himself too far.  He monitors heart rate when he exercises, typically achieving a peak of 110-120 bpm.  Prior to Admission medications   Medication Sig Start Date End Date Taking? Authorizing Provider  aspirin 81 MG tablet Take 81 mg by mouth daily.   Yes Historical Provider, MD  cetirizine (ZYRTEC) 10 MG tablet Take 10 mg by mouth daily.   Yes Historical Provider, MD  diltiazem (CARDIZEM CD) 240 MG 24 hr capsule Take 240 mg by mouth daily.   Yes Historical Provider, MD  meclizine (ANTIVERT) 25 MG tablet Take 25 mg by mouth 2 (two) times daily as needed. For dizziness   Yes Historical Provider, MD  meloxicam (MOBIC) 15 MG tablet Take 15 mg by mouth as needed.    Yes Historical Provider, MD  metoprolol succinate (TOPROL-XL) 25 MG 24 hr tablet Take 1 tablet (25 mg total) by mouth daily. 06/24/11 06/23/12 Yes Jessica A Hope, PA-C  mometasone (NASONEX) 50 MCG/ACT nasal spray Place 2 sprays into the nose 2 (two) times daily.   Yes Historical Provider, MD  Multiple Vitamin (MULTIVITAMIN) tablet Take 1 tablet by mouth daily.   Yes Historical Provider, MD  telmisartan (MICARDIS) 40 MG tablet Take 40 mg by mouth daily.   Yes Historical Provider, MD  traZODone (DESYREL) 50 MG tablet Take 50 mg by mouth at bedtime.   Yes Historical Provider, MD  triamcinolone cream (KENALOG) 0.1 % Apply topically 2 (two) times daily.   Yes Historical Provider, MD  triamterene-hydrochlorothiazide (MAXZIDE-25) 37.5-25 MG per tablet Take 1 tablet by mouth daily. 09/20/11  Yes Kathlen Brunswick, MD   No Known Allergies    Past medical history, social history, and family history  reviewed and updated.  ROS: Denies chest discomfort or exertional dyspnea.  No palpitations, lightheadedness or syncope.  All other systems reviewed and are negative.  PHYSICAL EXAM: BP 101/71  Pulse 58  Ht 5\' 9"  (1.753 m)  Wt 100.299 kg (221 lb 1.9 oz)  BMI 32.65 kg/m2  SpO2 98%  General-Well developed; no acute distress Body habitus-mildly overweight Neck-No JVD; no carotid bruits Lungs-clear lung fields; resonant to percussion Cardiovascular-normal PMI; normal S1 and S2; irregular rhythm Abdomen-normal bowel sounds; soft and non-tender without masses or organomegaly Musculoskeletal-No deformities, no cyanosis or clubbing Neurologic-Normal cranial nerves; symmetric strength and tone Skin-Warm, no significant lesions Extremities-distal pulses intact; 1/2+ edema  EKG: Atrial fibrillation with controlled ventricular response of 65 bpm   ASSESSMENT AND PLAN:  [place] Bing, MD 03/24/2012 8:11 PM

## 2012-03-24 NOTE — Assessment & Plan Note (Signed)
Mild abdominal aortic aneurysm, currently followed by [facility].

## 2012-03-24 NOTE — Assessment & Plan Note (Signed)
No recent chest discomfort.

## 2012-03-24 NOTE — Assessment & Plan Note (Signed)
Patient remains fearful of anticoagulation.  Nonetheless, he utilized rivaroxaban for a number of weeks, but experienced headaches and malaise prompting him to discontinue that drug.  He is not inclined to try other available medications, noting that he has done well for quite some time on aspirin alone.

## 2012-03-24 NOTE — Assessment & Plan Note (Signed)
Patient has no apparent significant peripheral vascular disease, but does experience leg aching with exercise.  This may be related to physical deconditioning, but an adverse effect of statin is also a prime consideration.  He will stop atorvastatin for one month to determine if his symptoms improve.

## 2012-03-31 ENCOUNTER — Other Ambulatory Visit: Payer: Self-pay | Admitting: *Deleted

## 2012-03-31 DIAGNOSIS — I4891 Unspecified atrial fibrillation: Secondary | ICD-10-CM

## 2012-03-31 DIAGNOSIS — R531 Weakness: Secondary | ICD-10-CM

## 2012-03-31 LAB — CBC
HCT: 40.9 % (ref 39.0–52.0)
Hemoglobin: 14.7 g/dL (ref 13.0–17.0)
MCH: 31.7 pg (ref 26.0–34.0)
MCHC: 35.9 g/dL (ref 30.0–36.0)
MCV: 88.3 fL (ref 78.0–100.0)
Platelets: 185 10*3/uL (ref 150–400)
RBC: 4.63 MIL/uL (ref 4.22–5.81)
RDW: 13.3 % (ref 11.5–15.5)
WBC: 5.4 10*3/uL (ref 4.0–10.5)

## 2012-03-31 LAB — COMPREHENSIVE METABOLIC PANEL
ALT: 22 U/L (ref 0–53)
AST: 25 U/L (ref 0–37)
Albumin: 4.2 g/dL (ref 3.5–5.2)
Alkaline Phosphatase: 34 U/L — ABNORMAL LOW (ref 39–117)
BUN: 20 mg/dL (ref 6–23)
CO2: 28 mEq/L (ref 19–32)
Calcium: 9.7 mg/dL (ref 8.4–10.5)
Chloride: 100 mEq/L (ref 96–112)
Creat: 1.21 mg/dL (ref 0.50–1.35)
Glucose, Bld: 87 mg/dL (ref 70–99)
Potassium: 4.2 mEq/L (ref 3.5–5.3)
Sodium: 137 mEq/L (ref 135–145)
Total Bilirubin: 0.7 mg/dL (ref 0.3–1.2)
Total Protein: 6.8 g/dL (ref 6.0–8.3)

## 2012-03-31 LAB — TSH: TSH: 2.003 u[IU]/mL (ref 0.350–4.500)

## 2012-04-02 LAB — BRAIN NATRIURETIC PEPTIDE: Brain Natriuretic Peptide: <2 pg/mL (ref 0.0–100.0)

## 2012-05-02 ENCOUNTER — Ambulatory Visit: Payer: Medicare Other | Admitting: Cardiology

## 2012-05-02 ENCOUNTER — Encounter: Payer: Self-pay | Admitting: Cardiology

## 2012-05-02 ENCOUNTER — Ambulatory Visit (INDEPENDENT_AMBULATORY_CARE_PROVIDER_SITE_OTHER): Payer: Medicare Other | Admitting: Cardiology

## 2012-05-02 VITALS — BP 104/78 | HR 80 | Ht 69.0 in | Wt 221.0 lb

## 2012-05-02 DIAGNOSIS — E782 Mixed hyperlipidemia: Secondary | ICD-10-CM

## 2012-05-02 DIAGNOSIS — I482 Chronic atrial fibrillation, unspecified: Secondary | ICD-10-CM

## 2012-05-02 DIAGNOSIS — I10 Essential (primary) hypertension: Secondary | ICD-10-CM

## 2012-05-02 DIAGNOSIS — I739 Peripheral vascular disease, unspecified: Secondary | ICD-10-CM

## 2012-05-02 DIAGNOSIS — I714 Abdominal aortic aneurysm, without rupture, unspecified: Secondary | ICD-10-CM

## 2012-05-02 DIAGNOSIS — I4891 Unspecified atrial fibrillation: Secondary | ICD-10-CM

## 2012-05-02 DIAGNOSIS — E785 Hyperlipidemia, unspecified: Secondary | ICD-10-CM

## 2012-05-02 NOTE — Patient Instructions (Signed)
Your physician recommends that you schedule a follow-up appointment in: 6 months  Stools x 3 and return to the office  Your physician recommends that you return for lab work in: within the week

## 2012-05-02 NOTE — Progress Notes (Deleted)
Name: [name]    DOB: Sep 18, 1940  Age: 71 y.o.  MR#: [id number]       PCP:  Dwana Melena, MD      Insurance: @PAYORNAME @   CC:   No chief complaint on file.  MEDICATION LIST  VS BP 104/78  Pulse 80  Ht 5\' 9"  (1.753 m)  Wt 221 lb (100.245 kg)  BMI 32.64 kg/m2  SpO2 96%  Weights Current Weight  05/02/12 221 lb (100.245 kg)  03/24/12 221 lb 1.9 oz (100.299 kg)  09/20/11 216 lb (97.977 kg)    Blood Pressure  BP Readings from Last 3 Encounters:  05/02/12 104/78  03/24/12 101/71  09/20/11 129/77     Admit date:  (Not on file) Last encounter with RMR:  03/24/2012   Allergy No Known Allergies  Current Outpatient Prescriptions  Medication Sig Dispense Refill  . aspirin 81 MG tablet Take 81 mg by mouth daily.      . cetirizine (ZYRTEC) 10 MG tablet Take 10 mg by mouth daily.      Marland Kitchen diltiazem (CARDIZEM CD) 240 MG 24 hr capsule Take 240 mg by mouth daily.      . meclizine (ANTIVERT) 25 MG tablet Take 25 mg by mouth 2 (two) times daily as needed. For dizziness      . meloxicam (MOBIC) 15 MG tablet Take 15 mg by mouth as needed.       . metoprolol succinate (TOPROL-XL) 25 MG 24 hr tablet Take 1 tablet (25 mg total) by mouth daily.  30 tablet  6  . mometasone (NASONEX) 50 MCG/ACT nasal spray Place 2 sprays into the nose 2 (two) times daily.      . Multiple Vitamin (MULTIVITAMIN) tablet Take 1 tablet by mouth daily.      Marland Kitchen telmisartan (MICARDIS) 40 MG tablet Take 40 mg by mouth daily.      . traZODone (DESYREL) 50 MG tablet Take 50 mg by mouth at bedtime.      . triamcinolone cream (KENALOG) 0.1 % Apply topically 2 (two) times daily.      Marland Kitchen triamterene-hydrochlorothiazide (MAXZIDE-25) 37.5-25 MG per tablet Take 1 tablet by mouth daily.        Discontinued Meds:   There are no discontinued medications.  Patient Active Problem List  Diagnosis  . Hypertension  . Hyperlipidemia  . Chronic atrial fibrillation  . Meniere's disease  . Renal hemorrhage, right  . Chest pain  .  Peripheral vascular disease  . Abdominal aortic aneurysm  . Fasting hyperglycemia    LABS Office Visit on 03/24/2012  Component Date Value  . Sodium 03/31/2012 137   . Potassium 03/31/2012 4.2   . Chloride 03/31/2012 100   . CO2 03/31/2012 28   . Glucose, Bld 03/31/2012 87   . BUN 03/31/2012 20   . Creat 03/31/2012 1.21   . Total Bilirubin 03/31/2012 0.7   . Alkaline Phosphatase 03/31/2012 34*  . AST 03/31/2012 25   . ALT 03/31/2012 22   . Total Protein 03/31/2012 6.8   . Albumin 03/31/2012 4.2   . Calcium 03/31/2012 9.7   . WBC 03/31/2012 5.4   . RBC 03/31/2012 4.63   . Hemoglobin 03/31/2012 14.7   . HCT 03/31/2012 40.9   . MCV 03/31/2012 88.3   . [facility] 03/31/2012 31.7   . MCHC 03/31/2012 35.9   . RDW 03/31/2012 13.3   . Platelets 03/31/2012 185   . TSH 03/31/2012 2.003   . Brain Natriuretic Peptide 03/31/2012 <2.0  Results for this Opt Visit:     Results for orders placed in visit on 03/24/12  COMPREHENSIVE METABOLIC PANEL      Component Value Range   Sodium 137  135 - 145 mEq/L   Potassium 4.2  3.5 - 5.3 mEq/L   Chloride 100  96 - 112 mEq/L   CO2 28  19 - 32 mEq/L   Glucose, Bld 87  70 - 99 mg/dL   BUN 20  6 - 23 mg/dL   Creat 1.61  0.96 - 0.45 mg/dL   Total Bilirubin 0.7  0.3 - 1.2 mg/dL   Alkaline Phosphatase 34 (*) 39 - 117 U/L   AST 25  0 - 37 U/L   ALT 22  0 - 53 U/L   Total Protein 6.8  6.0 - 8.3 g/dL   Albumin 4.2  3.5 - 5.2 g/dL   Calcium 9.7  8.4 - 40.9 mg/dL  CBC      Component Value Range   WBC 5.4  4.0 - 10.5 K/uL   RBC 4.63  4.22 - 5.81 MIL/uL   Hemoglobin 14.7  13.0 - 17.0 g/dL   HCT 81.1  91.4 - 78.2 %   MCV 88.3  78.0 - 100.0 fL   MCH 31.7  26.0 - 34.0 pg   MCHC 35.9  30.0 - 36.0 g/dL   RDW 95.6  21.3 - 08.6 %   Platelets 185  150 - 400 K/uL  TSH      Component Value Range   TSH 2.003  0.350 - 4.500 uIU/mL  BRAIN NATRIURETIC PEPTIDE      Component Value Range   Brain Natriuretic Peptide <2.0  0.0 - 100.0 pg/mL     EKG Orders placed during the hospital encounter of 06/22/11  . EKG     Prior Assessment and Plan Problem List as of 05/02/2012            Cardiology Problems   Hypertension   Last Assessment & Plan Note   03/24/2012 Office Visit Addendum 03/24/2012  8:32 PM by Kathlen Brunswick, MD    Blood pressure control has been excellent for the past 12 months.  Current medications will be continued while in electrolytes and renal function are followed with serial laboratory testing.    Hyperlipidemia   Last Assessment & Plan Note   03/24/2012 Office Visit Signed 03/24/2012  8:28 PM by Kathlen Brunswick, MD    Control of hyperlipidemia is relatively good, but patient may be experiencing significant adverse effects to his statin.  That medication will be discontinued for one month to determine if symptoms improve.    Chronic atrial fibrillation   Last Assessment & Plan Note   09/20/2011 Office Visit Addendum 09/24/2011  3:39 PM by Kathlen Brunswick, MD    Heart rate is well controlled, and patient is asymptomatic.  Long discussion regarding risks and goals of anticoagulation resulted in patient agreeing to take Rivaroxaban, therapy with which will be initiated.  A PTT will be obtained in 3 weeks.    Peripheral vascular disease   Last Assessment & Plan Note   03/24/2012 Office Visit Signed 03/24/2012  8:19 PM by Kathlen Brunswick, MD    Patient has no apparent significant peripheral vascular disease, but does experience leg aching with exercise.  This may be related to physical deconditioning, but an adverse effect of statin is also a prime consideration.  He will stop atorvastatin for one month to determine if his symptoms  improve.    Abdominal aortic aneurysm   Last Assessment & Plan Note   03/24/2012 Office Visit Signed 03/24/2012  8:25 PM by Kathlen Brunswick, MD    Mild abdominal aortic aneurysm, currently followed by [facility].      Other   Meniere's disease   Renal  hemorrhage, right   Last Assessment & Plan Note   03/24/2012 Office Visit Signed 03/24/2012  8:18 PM by Kathlen Brunswick, MD    Patient remains fearful of anticoagulation.  Nonetheless, he utilized rivaroxaban for a number of weeks, but experienced headaches and malaise prompting him to discontinue that drug.  He is not inclined to try other available medications, noting that he has done well for quite some time on aspirin alone.    Chest pain   Last Assessment & Plan Note   03/24/2012 Office Visit Signed 03/24/2012  8:21 PM by Kathlen Brunswick, MD    No recent chest discomfort.    Fasting hyperglycemia       Imaging: No results found.   FRS Calculation: Score not calculated. Missing: Total Cholesterol

## 2012-05-06 NOTE — Assessment & Plan Note (Addendum)
Patient reports treatment with Lipitor, which is not on his current medication list. We will verify his reported medical regime.[name]

## 2012-05-06 NOTE — Assessment & Plan Note (Signed)
Blood pressure control has been excellent during the past 12 months. Current medication will be continued.

## 2012-05-06 NOTE — Assessment & Plan Note (Signed)
Abdominal aortic aneurysm is small and has been stable, not progressing at all for the past 3 years. Periodic monitoring by vascular surgery is appropriate.

## 2012-05-06 NOTE — Assessment & Plan Note (Signed)
Low to moderate risk for thromboembolism with mild hypertension that has been very well controlled, mild ASVD, low LAA velocity (TEE performed in 02/2008), and moderately advanced age. Anticoagulation deferred due to prior intraparenchymal renal hemorrhage, absence of thromboembolic events, and patient preference.  As he continues to age, reconsideration will be given to a full anticoagulation.

## 2012-05-06 NOTE — Progress Notes (Signed)
Patient ID: CON ARGANBRIGHT, male   DOB: [birth year], 71 y.o.   MRN: [id number]  HPI: Scheduled return visit for this nice gentleman with atrial fibrillation, a small aortic aneurysm, peripheral vascular disease, multiple cardiovascular risk factors, but no known coronary disease. Has done well symptomatically with generally decent exercise tolerance and occasional episodes of dyspnea that are brief, mild and attributed to sinus problems by Mr. Barton.  Prior to Admission medications   Medication Sig Start Date End Date Taking? Authorizing Provider  aspirin 81 MG tablet Take 81 mg by mouth daily.   Yes Historical Provider, MD  cetirizine (ZYRTEC) 10 MG tablet Take 10 mg by mouth daily.   Yes Historical Provider, MD  diltiazem (CARDIZEM CD) 240 MG 24 hr capsule Take 240 mg by mouth daily.   Yes Historical Provider, MD  meclizine (ANTIVERT) 25 MG tablet Take 25 mg by mouth 2 (two) times daily as needed. For dizziness   Yes Historical Provider, MD  meloxicam (MOBIC) 15 MG tablet Take 15 mg by mouth as needed.    Yes Historical Provider, MD  metoprolol succinate (TOPROL-XL) 25 MG 24 hr tablet Take 1 tablet (25 mg total) by mouth daily. 06/24/11 06/23/12 Yes Jessica A Hope, PA-C  mometasone (NASONEX) 50 MCG/ACT nasal spray Place 2 sprays into the nose 2 (two) times daily.   Yes Historical Provider, MD  Multiple Vitamin (MULTIVITAMIN) tablet Take 1 tablet by mouth daily.   Yes Historical Provider, MD  telmisartan (MICARDIS) 40 MG tablet Take 40 mg by mouth daily.   Yes Historical Provider, MD  traZODone (DESYREL) 50 MG tablet Take 50 mg by mouth at bedtime.   Yes Historical Provider, MD  triamcinolone cream (KENALOG) 0.1 % Apply topically 2 (two) times daily.   Yes Historical Provider, MD  triamterene-hydrochlorothiazide (MAXZIDE-25) 37.5-25 MG per tablet Take 1 tablet by mouth daily. 09/20/11  Yes Kathlen Brunswick, MD  No Known Allergies    Past medical history, social history, and family history reviewed  and updated.  ROS: Denies orthopnea, PND, palpitations, lightheadedness or syncope. No abdominal pain. No back pain. All other systems reviewed and are negative.  PHYSICAL EXAM: BP 104/78  Pulse 80  Ht 5\' 9"  (1.753 m)  Wt 100.245 kg (221 lb)  BMI 32.64 kg/m2  SpO2 96%  General-Well developed; no acute distress Body habitus-overweight Neck-No JVD; no carotid bruits Lungs-clear lung fields; resonant to percussion Cardiovascular-normal PMI; normal S1 and S2; irregular rhythm Abdomen-normal bowel sounds; soft and non-tender without masses or organomegaly; aortic pulsation not palpable Musculoskeletal-No deformities, no cyanosis or clubbing Neurologic-Normal cranial nerves; symmetric strength and tone Skin-Warm, no significant lesions Extremities-distal pulses intact; modest ankle edema  ASSESSMENT AND PLAN:  [place] Bing, MD 05/06/2012 1:20 PM

## 2012-05-11 LAB — LIPID PANEL
Cholesterol: 211 mg/dL — ABNORMAL HIGH (ref 0–200)
HDL: 41 mg/dL (ref >39)
LDL Cholesterol: 115 mg/dL — ABNORMAL HIGH (ref 0–99)
Total CHOL/HDL Ratio: 5.1 Ratio
Triglycerides: 274 mg/dL — ABNORMAL HIGH (ref <150)
VLDL: 55 mg/dL — ABNORMAL HIGH (ref 0–40)

## 2012-05-13 ENCOUNTER — Encounter: Payer: Self-pay | Admitting: Cardiology

## 2012-05-16 ENCOUNTER — Other Ambulatory Visit: Payer: Self-pay | Admitting: *Deleted

## 2012-05-17 ENCOUNTER — Ambulatory Visit (INDEPENDENT_AMBULATORY_CARE_PROVIDER_SITE_OTHER): Payer: Medicare Other | Admitting: *Deleted

## 2012-05-17 DIAGNOSIS — Z0000 Encounter for general adult medical examination without abnormal findings: Secondary | ICD-10-CM

## 2012-05-18 ENCOUNTER — Other Ambulatory Visit: Payer: Self-pay | Admitting: *Deleted

## 2012-05-18 ENCOUNTER — Encounter: Payer: Self-pay | Admitting: *Deleted

## 2012-05-18 DIAGNOSIS — E782 Mixed hyperlipidemia: Secondary | ICD-10-CM

## 2012-05-18 MED ORDER — PRAVASTATIN SODIUM 10 MG PO TABS: 10.0000 mg | ORAL_TABLET | Freq: Every day | ORAL | Status: DC

## 2012-05-19 DIAGNOSIS — Z0000 Encounter for general adult medical examination without abnormal findings: Secondary | ICD-10-CM

## 2012-05-19 LAB — HEMOCCULT GUIAC POC 1CARD (OFFICE)
Card #2 Fecal Occult Blod, POC: NEGATIVE
Card #3 Fecal Occult Blood, POC: NEGATIVE
Fecal Occult Blood, POC: NEGATIVE

## 2012-06-19 ENCOUNTER — Other Ambulatory Visit: Payer: Self-pay | Admitting: Cardiology

## 2012-06-20 ENCOUNTER — Encounter: Payer: Self-pay | Admitting: Cardiology

## 2012-06-20 ENCOUNTER — Encounter: Payer: Self-pay | Admitting: *Deleted

## 2012-06-20 ENCOUNTER — Other Ambulatory Visit: Payer: Self-pay | Admitting: *Deleted

## 2012-06-20 DIAGNOSIS — E782 Mixed hyperlipidemia: Secondary | ICD-10-CM

## 2012-06-20 LAB — TSH: TSH: 3.758 u[IU]/mL (ref 0.350–4.500)

## 2012-06-21 ENCOUNTER — Encounter: Payer: Self-pay | Admitting: *Deleted

## 2012-07-25 ENCOUNTER — Other Ambulatory Visit: Payer: Self-pay | Admitting: Cardiology

## 2012-07-26 LAB — LIPID PANEL
Cholesterol: 176 mg/dL (ref 0–200)
HDL: 42 mg/dL (ref >39)
LDL Cholesterol: 105 mg/dL — ABNORMAL HIGH (ref 0–99)
Total CHOL/HDL Ratio: 4.2 Ratio
Triglycerides: 144 mg/dL (ref <150)
VLDL: 29 mg/dL (ref 0–40)

## 2012-08-03 ENCOUNTER — Encounter: Payer: Self-pay | Admitting: Cardiology

## 2012-08-08 MED ORDER — PRAVASTATIN SODIUM 20 MG PO TABS: 20.0000 mg | ORAL_TABLET | Freq: Every evening | ORAL | Status: DC

## 2012-08-08 NOTE — Addendum Note (Signed)
Addended by: Derry Lory A on: 08/08/2012 03:10 PM   Modules accepted: Orders

## 2012-08-21 ENCOUNTER — Encounter (INDEPENDENT_AMBULATORY_CARE_PROVIDER_SITE_OTHER): Payer: Self-pay | Admitting: *Deleted

## 2012-08-24 ENCOUNTER — Telehealth (INDEPENDENT_AMBULATORY_CARE_PROVIDER_SITE_OTHER): Payer: Self-pay | Admitting: *Deleted

## 2012-08-24 ENCOUNTER — Encounter (INDEPENDENT_AMBULATORY_CARE_PROVIDER_SITE_OTHER): Payer: Self-pay | Admitting: *Deleted

## 2012-08-24 ENCOUNTER — Other Ambulatory Visit (INDEPENDENT_AMBULATORY_CARE_PROVIDER_SITE_OTHER): Payer: Self-pay | Admitting: *Deleted

## 2012-08-24 DIAGNOSIS — Z1211 Encounter for screening for malignant neoplasm of colon: Secondary | ICD-10-CM

## 2012-08-24 MED ORDER — PEG-KCL-NACL-NASULF-NA ASC-C 100 G PO SOLR: 1.0000 | Freq: Once | ORAL | Status: DC

## 2012-08-24 NOTE — Telephone Encounter (Signed)
Patient needs movi prep 

## 2012-09-13 ENCOUNTER — Telehealth (INDEPENDENT_AMBULATORY_CARE_PROVIDER_SITE_OTHER): Payer: Self-pay | Admitting: *Deleted

## 2012-09-13 NOTE — Telephone Encounter (Signed)
  Procedure: tcs  Reason/Indication:  screening  Has patient had this procedure before?  no  If so, when, by whom and where?    Is there a family history of colon cancer?  no  Who?  What age when diagnosed?    Is patient diabetic?   no      Does patient have prosthetic heart valve?  no  Do you have a pacemaker?  no  Has patient ever had endocarditis? no  Has patient had joint replacement within last 12 months?  no  Is patient on Coumadin, Plavix and/or Aspirin? yes  Medications: asa 81 mg daily, micardis 80 mg 1/2 tab daily, meloxicam 15 mg prn, metoprolol 25 mg daily, meclizine 25 mg bid, triamterene 37.5/25 mg daily, nasonex 50 mcg 1 spray each nostril bid, centrum silver daily, diltiazem 240 mg daily, trazadone 50 mg daily, zyrtec 10 mg daily, pravastatin 20 mg daily  Allergies: nkda  Medication Adjustment: asa 2 days  Procedure date & time: 10/12/12 at 1030

## 2012-09-13 NOTE — Telephone Encounter (Signed)
agree

## 2012-10-12 ENCOUNTER — Ambulatory Visit (HOSPITAL_COMMUNITY): Admission: RE | Admit: 2012-10-12 | Payer: Medicare Other | Source: Ambulatory Visit | Admitting: Internal Medicine

## 2012-10-12 ENCOUNTER — Encounter (HOSPITAL_COMMUNITY): Admission: RE | Payer: Self-pay | Source: Ambulatory Visit

## 2012-10-12 SURGERY — COLONOSCOPY
Anesthesia: Moderate Sedation

## 2012-11-16 ENCOUNTER — Ambulatory Visit: Payer: Medicare Other | Admitting: Cardiology

## 2012-11-23 ENCOUNTER — Ambulatory Visit: Payer: Medicare Other | Admitting: Cardiology

## 2012-12-06 ENCOUNTER — Ambulatory Visit (INDEPENDENT_AMBULATORY_CARE_PROVIDER_SITE_OTHER): Payer: Medicare Other | Admitting: Adult Health

## 2012-12-06 ENCOUNTER — Encounter: Payer: Self-pay | Admitting: Adult Health

## 2012-12-06 VITALS — BP 118/64 | HR 58 | Ht 69.0 in | Wt 219.8 lb

## 2012-12-06 DIAGNOSIS — E785 Hyperlipidemia, unspecified: Secondary | ICD-10-CM

## 2012-12-06 DIAGNOSIS — I10 Essential (primary) hypertension: Secondary | ICD-10-CM

## 2012-12-06 DIAGNOSIS — I482 Chronic atrial fibrillation, unspecified: Secondary | ICD-10-CM

## 2012-12-06 DIAGNOSIS — E782 Mixed hyperlipidemia: Secondary | ICD-10-CM

## 2012-12-06 DIAGNOSIS — I4891 Unspecified atrial fibrillation: Secondary | ICD-10-CM

## 2012-12-06 NOTE — Progress Notes (Deleted)
Name: [name]    DOB: [birth year]  Age: 72 y.o.  MR#: [id number]       PCP:  Catalina Pizza, MD      Insurance: Payor: [employer] [place] [employer] / Plan: BLUE MEDICARE / Product Type: *No Product type* /   CC:    Chief Complaint  Patient presents with  . Atrial Fibrillation  . Hypertension    VS Filed Vitals:   12/06/12 1425  BP: 118/64  Pulse: 58  Height: 5\' 9"  (1.753 m)  Weight: 219 lb 12.8 oz (99.701 kg)    Weights Current Weight  12/06/12 219 lb 12.8 oz (99.701 kg)  05/02/12 221 lb (100.245 kg)  03/24/12 221 lb 1.9 oz (100.299 kg)    Blood Pressure  BP Readings from Last 3 Encounters:  12/06/12 118/64  05/02/12 104/78  03/24/12 101/71     Admit date:  (Not on file) Last encounter with RMR:  Visit date not found   Allergy Statins  Current Outpatient Prescriptions  Medication Sig Dispense Refill  . aspirin 81 MG tablet Take 81 mg by mouth daily.      . cetirizine (ZYRTEC) 10 MG tablet Take 10 mg by mouth daily.      Marland Kitchen diltiazem (CARDIZEM CD) 240 MG 24 hr capsule Take 240 mg by mouth daily.      . meclizine (ANTIVERT) 25 MG tablet Take 25 mg by mouth 2 (two) times daily as needed. For dizziness      . meloxicam (MOBIC) 15 MG tablet Take 15 mg by mouth as needed.       . mometasone (NASONEX) 50 MCG/ACT nasal spray Place 1 spray into the nose 2 (two) times daily.       . Multiple Vitamin (MULTIVITAMIN) tablet Take 1 tablet by mouth daily.      Marland Kitchen olmesartan (BENICAR) 20 MG tablet Take 20 mg by mouth daily.      . pravastatin (PRAVACHOL) 20 MG tablet Take 1 tablet (20 mg total) by mouth every evening.  90 tablet  1  . traZODone (DESYREL) 50 MG tablet Take 50 mg by mouth at bedtime.      . triamcinolone cream (KENALOG) 0.1 % Apply topically 2 (two) times daily as needed.       . triamterene-hydrochlorothiazide (MAXZIDE-25) 37.5-25 MG per tablet Take 1 tablet by mouth daily.      . metoprolol succinate (TOPROL-XL) 25 MG 24 hr tablet Take 1 tablet (25 mg  total) by mouth daily.  30 tablet  6   No current facility-administered medications for this visit.    Discontinued Meds:    Medications Discontinued During This Encounter  Medication Reason  . peg 3350 powder (MOVIPREP) 100 G SOLR Error  . telmisartan (MICARDIS) 40 MG tablet Error    Patient Active Problem List   Diagnosis Date Noted  . Abdominal aortic aneurysm 03/24/2012  . Fasting hyperglycemia 03/24/2012  . Peripheral vascular disease 09/20/2011  . Chest pain 07/17/2011  . Hypertension   . Hyperlipidemia   . Chronic atrial fibrillation   . Meniere's disease   . Renal hemorrhage, right     LABS    Component Value Date/Time   NA 137 03/31/2012 1045   NA 139 06/22/2011 1628   NA 137 01/12/2008 0420   K 4.2 03/31/2012 1045   K 4.6 06/22/2011 1628   K 3.9 01/12/2008 0420   CL 100 03/31/2012 1045   CL 102 06/22/2011 1628   CL  98 01/12/2008 0420   CO2 28 03/31/2012 1045   CO2 31 06/22/2011 1628   CO2 33* 01/12/2008 0420   GLUCOSE 87 03/31/2012 1045   GLUCOSE 98 06/22/2011 1628   GLUCOSE 128* 01/12/2008 0420   BUN 20 03/31/2012 1045   BUN 19 06/22/2011 1628   BUN 23 01/12/2008 0420   CREATININE 1.21 03/31/2012 1045   CREATININE 1.09 06/22/2011 1628   CREATININE 1.50 01/12/2008 0420   CREATININE 1.30 01/11/2008 0440   CALCIUM 9.7 03/31/2012 1045   CALCIUM 9.3 06/22/2011 1628   CALCIUM 8.7 01/12/2008 0420   GFRNONAA 68* 06/22/2011 1628   GFRNONAA 47* 01/12/2008 0420   GFRNONAA 56* 01/11/2008 0440   GFRAA 79* 06/22/2011 1628   GFRAA  Value: 57        The eGFR has been calculated using the MDRD equation. This calculation has not been validated in all clinical* 01/12/2008 0420   GFRAA  Value: >60        The eGFR has been calculated using the MDRD equation. This calculation has not been validated in all clinical 01/11/2008 0440   CMP     Component Value Date/Time   NA 137 03/31/2012 1045   K 4.2 03/31/2012 1045   CL 100 03/31/2012 1045   CO2 28 03/31/2012 1045   GLUCOSE 87 03/31/2012 1045    BUN 20 03/31/2012 1045   CREATININE 1.21 03/31/2012 1045   CREATININE 1.09 06/22/2011 1628   CALCIUM 9.7 03/31/2012 1045   PROT 6.8 03/31/2012 1045   ALBUMIN 4.2 03/31/2012 1045   AST 25 03/31/2012 1045   ALT 22 03/31/2012 1045   ALKPHOS 34* 03/31/2012 1045   BILITOT 0.7 03/31/2012 1045   GFRNONAA 68* 06/22/2011 1628   GFRAA 79* 06/22/2011 1628       Component Value Date/Time   WBC 5.4 03/31/2012 1045   WBC 9.4 06/24/2011 0000   WBC 8.7 06/23/2011 0515   HGB 14.7 03/31/2012 1045   HGB 13.2 06/24/2011 0000   HGB 13.7 06/23/2011 0515   HCT 40.9 03/31/2012 1045   HCT 38.9* 06/24/2011 0000   HCT 40.5 06/23/2011 0515   MCV 88.3 03/31/2012 1045   MCV 90.5 06/24/2011 0000   MCV 90.6 06/23/2011 0515    Lipid Panel     Component Value Date/Time   CHOL 176 07/25/2012 0725   TRIG 144 07/25/2012 0725   HDL 42 07/25/2012 0725   CHOLHDL 4.2 07/25/2012 0725   VLDL 29 07/25/2012 0725   LDLCALC 105* 07/25/2012 0725    ABG    Component Value Date/Time   PHART 7.466* 01/11/2008 0845   PCO2ART 42.2 01/11/2008 0845   PO2ART 68.6* 01/11/2008 0845   HCO3 30.0* 01/11/2008 0845   TCO2 26.3 01/11/2008 0845   O2SAT 94.5 01/11/2008 0845     Lab Results  Component Value Date   TSH 3.758 06/19/2012   BNP (last 3 results) No results found for this basename: PROBNP,  in the last 8760 hours Cardiac Panel (last 3 results) No results found for this basename: CKTOTAL, CKMB, TROPONINI, RELINDX,  in the last 72 hours  Iron/TIBC/Ferritin No results found for this basename: iron, tibc, ferritin     EKG Orders placed during the hospital encounter of 06/22/11  . EKG     Prior Assessment and Plan Problem List as of 12/06/2012     Cardiovascular and Mediastinum   Hypertension   Last Assessment & Plan   05/02/2012 Office Visit Written 05/06/2012  1:43 PM by Molly Maduro  Rosebud Poles, MD     Blood pressure control has been excellent during the past 12 months. Current medication will be continued.    Chronic atrial fibrillation    Last Assessment & Plan   05/02/2012 Office Visit Written 05/06/2012  1:45 PM by Kathlen Brunswick, MD     Low to moderate risk for thromboembolism with mild hypertension that has been very well controlled, mild ASVD, low LAA velocity (TEE performed in 02/2008), and moderately advanced age. Anticoagulation deferred due to prior intraparenchymal renal hemorrhage, absence of thromboembolic events, and patient preference.  As he continues to age, reconsideration will be given to a full anticoagulation.      Peripheral vascular disease   Last Assessment & Plan   03/24/2012 Office Visit Written 03/24/2012  8:19 PM by Kathlen Brunswick, MD     Patient has no apparent significant peripheral vascular disease, but does experience leg aching with exercise.  This may be related to physical deconditioning, but an adverse effect of statin is also a prime consideration.  He will stop atorvastatin for one month to determine if his symptoms improve.    Abdominal aortic aneurysm   Last Assessment & Plan   05/02/2012 Office Visit Written 05/06/2012  1:26 PM by Kathlen Brunswick, MD     Abdominal aortic aneurysm is small and has been stable, not progressing at all for the past 3 years. Periodic monitoring by vascular surgery is appropriate.      Endocrine   Fasting hyperglycemia     Nervous and Auditory   Meniere's disease     Genitourinary   Renal hemorrhage, right   Last Assessment & Plan   03/24/2012 Office Visit Written 03/24/2012  8:18 PM by Kathlen Brunswick, MD     Patient remains fearful of anticoagulation.  Nonetheless, he utilized rivaroxaban for a number of weeks, but experienced headaches and malaise prompting him to discontinue that drug.  He is not inclined to try other available medications, noting that he has done well for quite some time on aspirin alone.      Other   Hyperlipidemia   Last Assessment & Plan   05/02/2012 Office Visit Edited 05/06/2012  1:38 PM by Kathlen Brunswick, MD      Patient reports treatment with Lipitor, which is not on his current medication list. We will verify his reported medical regime..    Chest pain   Last Assessment & Plan   03/24/2012 Office Visit Written 03/24/2012  8:21 PM by Kathlen Brunswick, MD     No recent chest discomfort.        Imaging: No results found.

## 2012-12-06 NOTE — Assessment & Plan Note (Signed)
Excellent control of BP. Will continue current medications. BMET will be completed for renal function on lasix. We will see him again in 1 year unless symptomatic.

## 2012-12-06 NOTE — Assessment & Plan Note (Signed)
Will have his lipids drawn this week. He wishes to continue with PCP for annual labs from this point on as he sees him every 6 months.

## 2012-12-06 NOTE — Assessment & Plan Note (Signed)
He is not a coumadin candidate due to prior bleeding. He will continue on ASA. He is acutely aware of his heart rate when it increases and has been unaware of any incidences of this. He wishes to comes on annual basis. I have explained to him that we would be happy to see him sooner if he becomes symptomatic or has fluid retention. I have discussed signs and symptoms of CHF. He verbalizes understanding.

## 2012-12-06 NOTE — Patient Instructions (Addendum)
Your physician recommends that you schedule a follow-up appointment in: 1 year You will receive a reminder letter in the mail in about 10 months reminding you to call and schedule your appointment. If you don't receive this letter, please contact our office.  Your physician recommends that you return for lab work in the next 2 weeks. Lipid, lfts, bmet

## 2012-12-06 NOTE — Progress Notes (Signed)
HPI: [name] is a 72 year old patient of Dr. Dietrich Pates we are following for ongoing assessment and management of atrial fibrillation, with history of peripheral vascular disease, small aortic aneurysm, along with cardiovascular risk factors. He was last seen in the office in December of 2013 and was doing very well. The patient is no longer on anticoagulation as he was found to be a low to moderate risk for Dr. Dietrich Pates at that time. He also has a history of end drop parenchymal renal hemorrhage. No medications were changed.   He comes today without complaint. He is active, had had no rapid heart rates, dizziness or DOE. He is followed by [facility] for AAA and has been advised that he only needs to have this checked every two years.   Allergies  Allergen Reactions  . Statins     Muscle aches    Current Outpatient Prescriptions  Medication Sig Dispense Refill  . aspirin 81 MG tablet Take 81 mg by mouth daily.      . cetirizine (ZYRTEC) 10 MG tablet Take 10 mg by mouth daily.      Marland Kitchen diltiazem (CARDIZEM CD) 240 MG 24 hr capsule Take 240 mg by mouth daily.      . meclizine (ANTIVERT) 25 MG tablet Take 25 mg by mouth 2 (two) times daily as needed. For dizziness      . meloxicam (MOBIC) 15 MG tablet Take 15 mg by mouth as needed.       . mometasone (NASONEX) 50 MCG/ACT nasal spray Place 1 spray into the nose 2 (two) times daily.       . Multiple Vitamin (MULTIVITAMIN) tablet Take 1 tablet by mouth daily.      Marland Kitchen olmesartan (BENICAR) 20 MG tablet Take 20 mg by mouth daily.      . pravastatin (PRAVACHOL) 20 MG tablet Take 1 tablet (20 mg total) by mouth every evening.  90 tablet  1  . traZODone (DESYREL) 50 MG tablet Take 50 mg by mouth at bedtime.      . triamcinolone cream (KENALOG) 0.1 % Apply topically 2 (two) times daily as needed.       . triamterene-hydrochlorothiazide (MAXZIDE-25) 37.5-25 MG per tablet Take 1 tablet by mouth daily.      . metoprolol succinate (TOPROL-XL) 25 MG 24 hr tablet Take  1 tablet (25 mg total) by mouth daily.  30 tablet  6   No current facility-administered medications for this visit.    Past Medical History  Diagnosis Date  . 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 Hypertension     Admitted for chest pain and 06/2011-neg stress Myoview,mod. LVH with nl EF on echo; TSH of 3.75  . Hyperlipidemia     06/2011:122, 84, 41, 64  . Chronic atrial fibrillation     Low to moderate risk for thromboembolism with mild ASVD and low LAA velocity+ hypertension and moderately advanced age  . Meniere's disease   . Degenerative joint disease   . Pneumonia   . Renal hemorrhage, right 2009    2009 while anticoagulated with Coumadin; treated with the renal artery embolization  . Nephrolithiasis     Past Surgical History  Procedure Laterality Date  . Pilonidal cyst excision    . Orif femur fracture      ZOX:WRUEAV of systems complete and found to be negative unless listed above  PHYSICAL EXAM BP 118/64  Pulse 58  Ht 5\' 9"  (  1.753 m)  Wt 219 lb 12.8 oz (99.701 kg)  BMI 32.44 kg/m2  General: Well developed, well nourished, in no acute distress Head: Eyes PERRLA, No xanthomas.   Normal cephalic and atramatic  Lungs: Essentially clear with mild bibasilar crackles. No wheezes or rhonchi. Heart: HRIR S1 S2, without MRG.  Pulses are 2+ & equal.            No carotid bruit. No JVD.   Abdomen: Bowel sounds are positive, abdomen soft and non-tender without masses or                  Hernia's noted. Msk:  Back normal, normal gait. Normal strength and tone for age. Extremities: No clubbing, cyanosis or edema.  DP +1 Neuro: Alert and oriented X 3. Psych:  Good affect, responds appropriately  EKG: Atrial fibrillation with slow ventricular rate. 58 bpm. Non-specific inferior T-wave abnormality.  ASSESSMENT AND PLAN

## 2012-12-08 LAB — HEPATIC FUNCTION PANEL
ALT: 16 U/L (ref 0–53)
AST: 16 U/L (ref 0–37)
Albumin: 4.2 g/dL (ref 3.5–5.2)
Alkaline Phosphatase: 26 U/L — ABNORMAL LOW (ref 39–117)
Bilirubin, Direct: 0.1 mg/dL (ref 0.0–0.3)
Indirect Bilirubin: 0.6 mg/dL (ref 0.0–0.9)
Total Bilirubin: 0.7 mg/dL (ref 0.3–1.2)
Total Protein: 6.6 g/dL (ref 6.0–8.3)

## 2012-12-08 LAB — LIPID PANEL
Cholesterol: 161 mg/dL (ref 0–200)
HDL: 37 mg/dL — ABNORMAL LOW (ref >39)
LDL Cholesterol: 91 mg/dL (ref 0–99)
Total CHOL/HDL Ratio: 4.4 Ratio
Triglycerides: 165 mg/dL — ABNORMAL HIGH (ref <150)
VLDL: 33 mg/dL (ref 0–40)

## 2012-12-08 LAB — BASIC METABOLIC PANEL
BUN: 24 mg/dL — ABNORMAL HIGH (ref 6–23)
CO2: 31 mEq/L (ref 19–32)
Calcium: 9.3 mg/dL (ref 8.4–10.5)
Chloride: 100 mEq/L (ref 96–112)
Creat: 1.3 mg/dL (ref 0.50–1.35)
Glucose, Bld: 114 mg/dL — ABNORMAL HIGH (ref 70–99)
Potassium: 4.6 mEq/L (ref 3.5–5.3)
Sodium: 135 mEq/L (ref 135–145)

## 2013-03-15 ENCOUNTER — Other Ambulatory Visit (HOSPITAL_COMMUNITY): Payer: Self-pay | Admitting: Internal Medicine

## 2013-03-15 ENCOUNTER — Ambulatory Visit (HOSPITAL_COMMUNITY)
Admission: RE | Admit: 2013-03-15 | Discharge: 2013-03-15 | Disposition: A | Discharge status: Home / Self Care | Payer: Medicare Other | Source: Ambulatory Visit | Attending: Internal Medicine | Admitting: Internal Medicine

## 2013-03-15 DIAGNOSIS — R059 Cough, unspecified: Secondary | ICD-10-CM

## 2013-03-15 DIAGNOSIS — R05 Cough: Secondary | ICD-10-CM

## 2013-04-03 ENCOUNTER — Other Ambulatory Visit: Payer: Self-pay | Admitting: Cardiology

## 2013-07-05 ENCOUNTER — Other Ambulatory Visit (HOSPITAL_COMMUNITY): Payer: Self-pay

## 2013-07-05 DIAGNOSIS — R0683 Snoring: Secondary | ICD-10-CM

## 2013-07-09 ENCOUNTER — Ambulatory Visit: Payer: Medicare Other | Attending: Internal Medicine | Admitting: Sleep Medicine

## 2013-07-09 ENCOUNTER — Encounter: Payer: Self-pay | Admitting: Neurology

## 2013-07-09 DIAGNOSIS — G4733 Obstructive sleep apnea (adult) (pediatric): Secondary | ICD-10-CM | POA: Insufficient documentation

## 2013-07-09 DIAGNOSIS — R0683 Snoring: Secondary | ICD-10-CM

## 2013-07-17 NOTE — Sleep Study (Signed)
  Jeannette A. Merlene Laughter, MD     [email or web address]          LOCATION: SLEEP LAB FACILITY: [facility]  PHYSICIAN: Haze Antillon A. Merlene Laughter, M.D.   DATE OF STUDY: 07/09/2013  NOCTURNAL POLYSOMNOGRAM   REFERRING PHYSICIAN: Zall Hall.  INDICATIONS: This is a 73 year old man who presents with fatigue, hypersomnia and snoring.  MEDICATIONS:  Prior to Admission medications   Medication Sig Start Date End Date Taking? Authorizing Provider  aspirin 81 MG tablet Take 81 mg by mouth daily.    Historical Provider, MD  cetirizine (ZYRTEC) 10 MG tablet Take 10 mg by mouth daily.    Historical Provider, MD  diltiazem (CARDIZEM CD) 240 MG 24 hr capsule Take 240 mg by mouth daily.    Historical Provider, MD  meclizine (ANTIVERT) 25 MG tablet Take 25 mg by mouth 2 (two) times daily as needed. For dizziness    Historical Provider, MD  meloxicam (MOBIC) 15 MG tablet Take 15 mg by mouth as needed.     Historical Provider, MD  metoprolol succinate (TOPROL-XL) 25 MG 24 hr tablet Take 1 tablet (25 mg total) by mouth daily. 06/24/11 06/23/12  Jessica A Hope, PA-C  mometasone (NASONEX) 50 MCG/ACT nasal spray Place 1 spray into the nose 2 (two) times daily.     Historical Provider, MD  Multiple Vitamin (MULTIVITAMIN) tablet Take 1 tablet by mouth daily.    Historical Provider, MD  olmesartan (BENICAR) 20 MG tablet Take 20 mg by mouth daily.    Historical Provider, MD  pravastatin (PRAVACHOL) 20 MG tablet TAKE ONE TABLET BY MOUTH ONCE EVERY EVENING. 04/03/13   Lendon Colonel, NP  traZODone (DESYREL) 50 MG tablet Take 50 mg by mouth at bedtime.    Historical Provider, MD  triamcinolone cream (KENALOG) 0.1 % Apply topically 2 (two) times daily as needed.     Historical Provider, MD  triamterene-hydrochlorothiazide (MAXZIDE-25) 37.5-25 MG per tablet Take 1 tablet by mouth daily. 09/20/11   Yehuda Savannah, MD      EPWORTH SLEEPINESS SCALE: 10.   BMI: 32.   ARCHITECTURAL SUMMARY: Total recording time  was 395 minutes. Sleep efficiency 87 %. Sleep latency 15 minutes. REM latency 116 minutes. Stage NI 4 %, N2 60 % and N3 18 % and REM sleep 17 %.    RESPIRATORY DATA:  Baseline oxygen saturation is 96 %. The lowest saturation is 88 %. The diagnostic AHI is 8. The RDI is 7. The REM AHI is 8. Most of the events occurred during the supine position. The supine AHI is 33.  LIMB MOVEMENT SUMMARY: PLM index 0.   ELECTROCARDIOGRAM SUMMARY: Average heart rate is 63 with no significant  dysrhythmias observed.   IMPRESSION:  1. Mild supine-related obstructive sleep apnea syndrome not requiring positive pressure treatment. However, positional therapy is recommended.  Thanks for this referral.  Cathaleen Korol A. Merlene Laughter, M.D. Diplomat, Tax adviser of Sleep Medicine.

## 2013-10-10 ENCOUNTER — Other Ambulatory Visit (HOSPITAL_COMMUNITY): Payer: Self-pay | Admitting: Internal Medicine

## 2013-10-10 ENCOUNTER — Ambulatory Visit (HOSPITAL_COMMUNITY)
Admission: RE | Admit: 2013-10-10 | Discharge: 2013-10-10 | Disposition: A | Discharge status: Home / Self Care | Payer: Medicare Other | Source: Ambulatory Visit | Attending: Internal Medicine | Admitting: Internal Medicine

## 2013-10-10 DIAGNOSIS — R059 Cough, unspecified: Secondary | ICD-10-CM | POA: Insufficient documentation

## 2013-10-10 DIAGNOSIS — Z8701 Personal history of pneumonia (recurrent): Secondary | ICD-10-CM | POA: Insufficient documentation

## 2013-10-10 DIAGNOSIS — J069 Acute upper respiratory infection, unspecified: Secondary | ICD-10-CM

## 2013-10-10 DIAGNOSIS — R05 Cough: Secondary | ICD-10-CM | POA: Insufficient documentation

## 2014-05-21 ENCOUNTER — Encounter (HOSPITAL_COMMUNITY): Payer: Self-pay | Admitting: *Deleted

## 2014-05-21 ENCOUNTER — Emergency Department (HOSPITAL_COMMUNITY)
Admission: EM | Admit: 2014-05-21 | Discharge: 2014-05-21 | Disposition: A | Discharge status: Home / Self Care | Payer: Medicare Other | Source: Home / Self Care | Attending: Family Medicine | Admitting: Family Medicine

## 2014-05-21 ENCOUNTER — Ambulatory Visit (HOSPITAL_COMMUNITY): Payer: Medicare Other | Attending: Family Medicine

## 2014-05-21 DIAGNOSIS — R05 Cough: Secondary | ICD-10-CM | POA: Diagnosis not present

## 2014-05-21 DIAGNOSIS — R053 Chronic cough: Secondary | ICD-10-CM

## 2014-05-21 DIAGNOSIS — J0101 Acute recurrent maxillary sinusitis: Secondary | ICD-10-CM

## 2014-05-21 MED ORDER — MINOCYCLINE HCL 100 MG PO CAPS: 100.0000 mg | ORAL_CAPSULE | Freq: Two times a day (BID) | ORAL | Status: DC

## 2014-05-21 MED ORDER — IPRATROPIUM BROMIDE 0.06 % NA SOLN: 2.0000 | Freq: Four times a day (QID) | NASAL | Status: DC

## 2014-05-21 NOTE — ED Provider Notes (Signed)
CSN: [id number]     Arrival date & time 05/21/14  1033 History   First MD Initiated Contact with Patient 05/21/14 1107     Chief Complaint  Patient presents with  . Cough   (Consider location/radiation/quality/duration/timing/severity/associated sxs/prior Treatment) Patient is a 73 y.o. male presenting with cough. The history is provided by the patient and the spouse.  Cough Cough characteristics:  Non-productive and dry Severity:  Mild Onset quality:  Gradual Duration:  4 weeks Progression:  Unchanged Chronicity:  New Smoker: no   Context: sick contacts, upper respiratory infection and weather changes   Relieved by:  None tried Worsened by:  Nothing tried Ineffective treatments:  None tried Associated symptoms: rhinorrhea and shortness of breath   Associated symptoms: no chest pain, no fever and no wheezing     Past Medical History  Diagnosis Date  . 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 Hypertension     Admitted for chest pain and 06/2011-neg stress Myoview,mod. LVH with nl EF on echo; TSH of 3.75  . Hyperlipidemia     06/2011:122, 84, 41, 64  . Chronic atrial fibrillation     Low to moderate risk for thromboembolism with mild ASVD and low LAA velocity+ hypertension and moderately advanced age  . Meniere's disease   . Degenerative joint disease   . Pneumonia   . Renal hemorrhage, right 2009    2009 while anticoagulated with Coumadin; treated with the renal artery embolization  . Nephrolithiasis    Past Surgical History  Procedure Laterality Date  . Pilonidal cyst excision    . Orif femur fracture     Family History  Problem Relation Age of Onset  . Hypertension Mother   . Atrial fibrillation Sister   . Stroke Father   . Cancer Mother    History  Substance Use Topics  . Smoking status: Former Smoker -- 1.00 packs/day for 40 years    Quit date: 01/20/2008  . Smokeless tobacco: Never Used     Comment: Smoked  for 40 years, quit 2009  . Alcohol Use: 0.5 oz/week    1 drink(s) per week    Review of Systems  Constitutional: Negative.  Negative for fever.  HENT: Positive for congestion, postnasal drip and rhinorrhea.   Respiratory: Positive for cough and shortness of breath. Negative for wheezing.   Cardiovascular: Negative for chest pain.  Gastrointestinal: Negative.   Genitourinary: Negative.     Allergies  Statins  Home Medications   Prior to Admission medications   Medication Sig Start Date End Date Taking? Authorizing Provider  aspirin 81 MG tablet Take 81 mg by mouth daily.    Historical Provider, MD  cetirizine (ZYRTEC) 10 MG tablet Take 10 mg by mouth daily.    Historical Provider, MD  diltiazem (CARDIZEM CD) 240 MG 24 hr capsule Take 240 mg by mouth daily.    Historical Provider, MD  ipratropium (ATROVENT) 0.06 % nasal spray Place 2 sprays into both nostrils 4 (four) times daily. 05/21/14   Billy Fischer, MD  meclizine (ANTIVERT) 25 MG tablet Take 25 mg by mouth 2 (two) times daily as needed. For dizziness    Historical Provider, MD  meloxicam (MOBIC) 15 MG tablet Take 15 mg by mouth as needed.     Historical Provider, MD  metoprolol succinate (TOPROL-XL) 25 MG 24 hr tablet Take 1 tablet (25 mg total) by mouth daily. 06/24/11 06/23/12  [place], PA-C  minocycline (MINOCIN,DYNACIN) 100 MG capsule Take 1 capsule (100 mg total) by mouth 2 (two) times daily. 05/21/14   Billy Fischer, MD  mometasone (NASONEX) 50 MCG/ACT nasal spray Place 1 spray into the nose 2 (two) times daily.     Historical Provider, MD  Multiple Vitamin (MULTIVITAMIN) tablet Take 1 tablet by mouth daily.    Historical Provider, MD  olmesartan (BENICAR) 20 MG tablet Take 20 mg by mouth daily.    Historical Provider, MD  pravastatin (PRAVACHOL) 20 MG tablet TAKE ONE TABLET BY MOUTH ONCE EVERY EVENING. 04/03/13   Lendon Colonel, NP  traZODone (DESYREL) 50 MG tablet Take 50 mg by mouth at bedtime.    Historical  Provider, MD  triamcinolone cream (KENALOG) 0.1 % Apply topically 2 (two) times daily as needed.     Historical Provider, MD  triamterene-hydrochlorothiazide (MAXZIDE-25) 37.5-25 MG per tablet Take 1 tablet by mouth daily. 09/20/11   Yehuda Savannah, MD   BP 130/85 mmHg  Pulse 79  Temp(Src) 98.2 F (36.8 C) (Oral)  Resp 20  SpO2 97% Physical Exam  Constitutional: He is oriented to person, place, and time. He appears well-developed and well-nourished. No distress.  HENT:  Head: Normocephalic.  Right Ear: External ear normal.  Left Ear: External ear normal.  Mouth/Throat: Oropharynx is clear and moist.  Neck: Normal range of motion. Neck supple.  Cardiovascular: Regular rhythm, normal heart sounds and intact distal pulses.   Pulmonary/Chest: Effort normal. He has rales. He exhibits no tenderness.  Lymphadenopathy:    He has no cervical adenopathy.  Neurological: He is alert and oriented to person, place, and time.  Skin: Skin is warm and dry.  Nursing note and vitals reviewed.   ED Course  Procedures (including critical care time) Labs Review Labs Reviewed - No data to display  Imaging Review Dg Chest 2 View  05/21/2014   CLINICAL DATA:  Persistent cough for 4 weeks, former smoking history  EXAM: CHEST  2 VIEW  COMPARISON:  Chest x-ray of 10/10/2013  FINDINGS: No active infiltrate or effusion is seen. Mediastinal and hilar contours are unremarkable. The heart is within upper limits of normal. No bony abnormality is seen.  IMPRESSION: No active cardiopulmonary disease.   Electronically Signed   By: Ivar Drape M.D.   On: 05/21/2014 12:12   X-rays reviewed and report per radiologist.   MDM   1. Acute recurrent maxillary sinusitis   2. Cough, persistent   3. Cough, persistent        Billy Fischer, MD 05/21/14 1250

## 2014-05-21 NOTE — Discharge Instructions (Signed)
Drink plenty of fluids as discussed, use medicine as prescribed, and mucinex or delsym for cough. Return or see your doctor if further problems °

## 2014-05-21 NOTE — ED Notes (Signed)
Pt   Reports      Symptoms            Of      Cough      Congestion             With      Symptoms  X  4  Weeks            -       Pt  Reports  The  Symptoms  Developed  After  Getting a  Flu  Shot            He   Is  Sitting  Upright on  Exam table  Speaking in  Complete  sentances

## 2014-11-21 ENCOUNTER — Encounter (HOSPITAL_COMMUNITY): Payer: Self-pay

## 2014-11-21 ENCOUNTER — Emergency Department (HOSPITAL_COMMUNITY): Payer: PPO

## 2014-11-21 ENCOUNTER — Emergency Department (HOSPITAL_COMMUNITY)
Admission: EM | Admit: 2014-11-21 | Discharge: 2014-11-21 | Disposition: A | Discharge status: Home / Self Care | Payer: PPO | Attending: Emergency Medicine | Admitting: Emergency Medicine

## 2014-11-21 DIAGNOSIS — R1011 Right upper quadrant pain: Secondary | ICD-10-CM | POA: Diagnosis not present

## 2014-11-21 DIAGNOSIS — Z8701 Personal history of pneumonia (recurrent): Secondary | ICD-10-CM | POA: Insufficient documentation

## 2014-11-21 DIAGNOSIS — Z87442 Personal history of urinary calculi: Secondary | ICD-10-CM | POA: Diagnosis not present

## 2014-11-21 DIAGNOSIS — I499 Cardiac arrhythmia, unspecified: Secondary | ICD-10-CM | POA: Insufficient documentation

## 2014-11-21 DIAGNOSIS — E785 Hyperlipidemia, unspecified: Secondary | ICD-10-CM | POA: Insufficient documentation

## 2014-11-21 DIAGNOSIS — I482 Chronic atrial fibrillation: Secondary | ICD-10-CM | POA: Diagnosis not present

## 2014-11-21 DIAGNOSIS — R0789 Other chest pain: Secondary | ICD-10-CM | POA: Diagnosis not present

## 2014-11-21 DIAGNOSIS — M549 Dorsalgia, unspecified: Secondary | ICD-10-CM | POA: Diagnosis not present

## 2014-11-21 DIAGNOSIS — Z7982 Long term (current) use of aspirin: Secondary | ICD-10-CM | POA: Insufficient documentation

## 2014-11-21 DIAGNOSIS — Z7951 Long term (current) use of inhaled steroids: Secondary | ICD-10-CM | POA: Insufficient documentation

## 2014-11-21 DIAGNOSIS — I10 Essential (primary) hypertension: Secondary | ICD-10-CM | POA: Diagnosis not present

## 2014-11-21 DIAGNOSIS — R110 Nausea: Secondary | ICD-10-CM | POA: Insufficient documentation

## 2014-11-21 DIAGNOSIS — Z87448 Personal history of other diseases of urinary system: Secondary | ICD-10-CM | POA: Insufficient documentation

## 2014-11-21 DIAGNOSIS — M1990 Unspecified osteoarthritis, unspecified site: Secondary | ICD-10-CM | POA: Diagnosis not present

## 2014-11-21 DIAGNOSIS — Z8669 Personal history of other diseases of the nervous system and sense organs: Secondary | ICD-10-CM | POA: Insufficient documentation

## 2014-11-21 DIAGNOSIS — R0602 Shortness of breath: Secondary | ICD-10-CM | POA: Insufficient documentation

## 2014-11-21 DIAGNOSIS — R079 Chest pain, unspecified: Secondary | ICD-10-CM | POA: Diagnosis present

## 2014-11-21 DIAGNOSIS — Z79899 Other long term (current) drug therapy: Secondary | ICD-10-CM | POA: Insufficient documentation

## 2014-11-21 DIAGNOSIS — Z87891 Personal history of nicotine dependence: Secondary | ICD-10-CM | POA: Insufficient documentation

## 2014-11-21 HISTORY — DX: Aneurysm of unspecified site: I72.9

## 2014-11-21 LAB — COMPREHENSIVE METABOLIC PANEL
ALT: 29 U/L (ref 17–63)
AST: 42 U/L — ABNORMAL HIGH (ref 15–41)
Albumin: 4.1 g/dL (ref 3.5–5.0)
Alkaline Phosphatase: 33 U/L — ABNORMAL LOW (ref 38–126)
Anion gap: 12 (ref 5–15)
BUN: 22 mg/dL — ABNORMAL HIGH (ref 6–20)
CO2: 25 mmol/L (ref 22–32)
Calcium: 9.3 mg/dL (ref 8.9–10.3)
Chloride: 102 mmol/L (ref 101–111)
Creatinine, Ser: 1.12 mg/dL (ref 0.61–1.24)
GFR calc Af Amer: >60 mL/min (ref >60)
GFR calc non Af Amer: >60 mL/min (ref >60)
Glucose, Bld: 141 mg/dL — ABNORMAL HIGH (ref 65–99)
Potassium: 3.7 mmol/L (ref 3.5–5.1)
Sodium: 139 mmol/L (ref 135–145)
Total Bilirubin: 1 mg/dL (ref 0.3–1.2)
Total Protein: 7.6 g/dL (ref 6.5–8.1)

## 2014-11-21 LAB — CBC WITH DIFFERENTIAL/PLATELET
Basophils Absolute: 0 10*3/uL (ref 0.0–0.1)
Basophils Relative: 0 % (ref 0–1)
Eosinophils Absolute: 0.2 10*3/uL (ref 0.0–0.7)
Eosinophils Relative: 2 % (ref 0–5)
HCT: 44.8 % (ref 39.0–52.0)
Hemoglobin: 15.4 g/dL (ref 13.0–17.0)
Lymphocytes Relative: 20 % (ref 12–46)
Lymphs Abs: 1.5 10*3/uL (ref 0.7–4.0)
MCH: 31.6 pg (ref 26.0–34.0)
MCHC: 34.4 g/dL (ref 30.0–36.0)
MCV: 92 fL (ref 78.0–100.0)
Monocytes Absolute: 0.8 10*3/uL (ref 0.1–1.0)
Monocytes Relative: 10 % (ref 3–12)
Neutro Abs: 5 10*3/uL (ref 1.7–7.7)
Neutrophils Relative %: 68 % (ref 43–77)
Platelets: 186 10*3/uL (ref 150–400)
RBC: 4.87 MIL/uL (ref 4.22–5.81)
RDW: 13.1 % (ref 11.5–15.5)
WBC: 7.5 10*3/uL (ref 4.0–10.5)

## 2014-11-21 LAB — LIPASE, BLOOD: Lipase: 876 U/L — ABNORMAL HIGH (ref 22–51)

## 2014-11-21 LAB — TROPONIN I: Troponin I: <0.03 ng/mL (ref <0.031)

## 2014-11-21 MED ORDER — SODIUM CHLORIDE 0.9 % IV SOLN
INTRAVENOUS | Status: DC
  Administered 2014-11-21: 10:00:00 via INTRAVENOUS

## 2014-11-21 NOTE — ED Notes (Addendum)
Patient states chest pain that goes straight through to back.  started 10 minutes prior to arrival. Pain started prior to eating breakfast and didn't feel well. Pain described as doubling over

## 2014-11-21 NOTE — ED Provider Notes (Signed)
CSN: [id number]     Arrival date & time 11/21/14  8657 History  This chart was scribed for Virgel Manifold, MD by Eustaquio Maize, ED Scribe. This patient was seen in room [facility] and the patient's care was started at 9:04 AM.     Chief Complaint  Patient presents with  . Chest Pain   The history is provided by the patient and the spouse. No language interpreter was used.     HPI Comments: [name] is a 74 y.o. male with hx HTN, HLD, chronic atrial fibrillation who presents to the Emergency Department complaining of sudden onset, constant, right sided chest pain radiating into back that began approximately 15 minutes ago. He describes the chest pain as a sharp in sensation. The pain is easing in severity but is still present. Pt states that he felt fine this morning upon waking up. He was fixing breakfast when the pain came on. There are no modifying factors to the pain. He also complains of nausea, cold sweat, and mild shortness of breath. Denies similar symptoms in the past. He does mention that he has two aortic aneurysms that are being monitored yearly by Vascular Surgeon, Dr. Kellie Simmering. Denies fever, chills, cough, difficulty urinating, or any other associated symptoms.   Past Medical History  Diagnosis Date  . 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 Hypertension     Admitted for chest pain and 06/2011-neg stress Myoview,mod. LVH with nl EF on echo; TSH of 3.75  . Hyperlipidemia     06/2011:122, 84, 41, 64  . Chronic atrial fibrillation     Low to moderate risk for thromboembolism with mild ASVD and low LAA velocity+ hypertension and moderately advanced age  . Meniere's disease   . Degenerative joint disease   . Pneumonia   . Renal hemorrhage, right 2009    2009 while anticoagulated with Coumadin; treated with the renal artery embolization  . Nephrolithiasis   . Aneurysm    Past Surgical History  Procedure Laterality Date  .  Pilonidal cyst excision    . Orif femur fracture     Family History  Problem Relation Age of Onset  . Hypertension Mother   . Atrial fibrillation Sister   . Stroke Father   . Cancer Mother    History  Substance Use Topics  . Smoking status: Former Smoker -- 1.00 packs/day for 40 years    Quit date: 01/20/2008  . Smokeless tobacco: Never Used     Comment: Smoked for 40 years, quit 2009  . Alcohol Use: 0.5 oz/week    1 Standard drinks or equivalent per week    Review of Systems  Constitutional: Negative for fever and chills.  Respiratory: Positive for shortness of breath. Negative for cough.   Cardiovascular: Positive for chest pain.  Gastrointestinal: Positive for nausea. Negative for vomiting.  Musculoskeletal: Positive for back pain.  All other systems reviewed and are negative.     Allergies  Statins  Home Medications   Prior to Admission medications   Medication Sig Start Date End Date Taking? Authorizing Provider  aspirin 81 MG tablet Take 81 mg by mouth daily.   Yes Historical Provider, MD  Azilsartan Medoxomil (EDARBI) 40 MG TABS Take 20 mg by mouth daily.   Yes Historical Provider, MD  cetirizine (ZYRTEC) 10 MG tablet Take 10 mg by mouth daily.   Yes Historical Provider, MD  diltiazem (CARDIZEM CD) 240 MG 24 hr  capsule Take 240 mg by mouth daily.   Yes Historical Provider, MD  fluticasone (FLONASE) 50 MCG/ACT nasal spray Place 2 sprays into both nostrils daily.   Yes Historical Provider, MD  ipratropium (ATROVENT) 0.06 % nasal spray Place 2 sprays into both nostrils 4 (four) times daily. 05/21/14  Yes Billy Fischer, MD  meclizine (ANTIVERT) 25 MG tablet Take 25 mg by mouth 2 (two) times daily as needed. For dizziness   Yes Historical Provider, MD  metoprolol succinate (TOPROL-XL) 25 MG 24 hr tablet Take 1 tablet (25 mg total) by mouth daily. 06/24/11 11/21/14 Yes Jessica A Hope, PA-C  Multiple Vitamin (MULTIVITAMIN) tablet Take 1 tablet by mouth daily.   Yes  Historical Provider, MD  pravastatin (PRAVACHOL) 20 MG tablet TAKE ONE TABLET BY MOUTH ONCE EVERY EVENING. 04/03/13  Yes Lendon Colonel, NP  traZODone (DESYREL) 50 MG tablet Take 50 mg by mouth at bedtime.   Yes Historical Provider, MD  triamterene-hydrochlorothiazide (MAXZIDE-25) 37.5-25 MG per tablet Take 1 tablet by mouth daily. 09/20/11  Yes Yehuda Savannah, MD   Triage Vitals: BP 148/90 mmHg  Pulse 61  Resp 17  Ht 5\' 9"  (1.753 m)  Wt 216 lb (97.977 kg)  BMI 31.88 kg/m2   Physical Exam  Constitutional: He is oriented to person, place, and time. He appears well-developed and well-nourished. No distress.  HENT:  Head: Normocephalic and atraumatic.  Eyes: Conjunctivae and EOM are normal.  Neck: Neck supple. No tracheal deviation present.  Cardiovascular: Normal rate.  An irregularly irregular rhythm present.  Pulmonary/Chest: Effort normal. No respiratory distress.  Abdominal: There is tenderness.  Tenderness in RUQ.   Musculoskeletal: Normal range of motion.  Neurological: He is alert and oriented to person, place, and time.  Skin: Skin is warm and dry.  Psychiatric: He has a normal mood and affect. His behavior is normal.  Nursing note and vitals reviewed.   ED Course  Procedures (including critical care time)  DIAGNOSTIC STUDIES: Oxygen Saturation is 98% on RA, normal by my interpretation.    COORDINATION OF CARE: 9:10 AM-Discussed treatment plan which includes abdominal US with pt at bedside and pt agreed to plan.   Labs Review Labs Reviewed  COMPREHENSIVE METABOLIC PANEL - Abnormal; Notable for the following:    Glucose, Bld 141 (*)    BUN 22 (*)    AST 42 (*)    Alkaline Phosphatase 33 (*)    All other components within normal limits  LIPASE, BLOOD - Abnormal; Notable for the following:    Lipase 876 (*)    All other components within normal limits  CBC WITH DIFFERENTIAL/PLATELET  TROPONIN I    Imaging Review Dg Chest 2 View  11/21/2014   CLINICAL  DATA:  Right-sided chest pain for approximately 1 hour.  EXAM: CHEST  2 VIEW  COMPARISON:  May 21, 2014  FINDINGS: Lungs are clear. Heart size and pulmonary vascularity are normal. No adenopathy. No pneumothorax. No bone lesions.  IMPRESSION: No edema or consolidation.   Electronically Signed   By: Lowella Grip III M.D.   On: 11/21/2014 10:00   US Abdomen Complete  11/21/2014   CLINICAL DATA:  RIGHT lateral chest and RIGHT upper quadrant pain, history hypertension, hyperlipidemia, atrial fibrillation, former smoker, nephrolithiasis  EXAM: ULTRASOUND ABDOMEN COMPLETE  COMPARISON:  CT abdomen and pelvis 06/03/2009  FINDINGS: Gallbladder: Normally distended without stones or wall thickening.  No pericholecystic fluid or sonographic Murphy sign.  Common bile duct: Diameter: Normal caliber  4 mm diameter  Liver: Upper normal echogenicity. No focal mass or nodularity. Hepatopetal portal venous flow.  IVC: Normal appearance  Pancreas: Portions of pancreatic head through proximal tail normal appearance, remainder obscured by bowel gas.  Spleen: Normal appearance, 11.3 cm length  Right Kidney: Length: 9.2 cm. Cortical thinning. Upper normal cortical echogenicity. No hydronephrosis or shadowing calcification. Cyst at inferior pole, 2.0 x 1.7 x 1.7 cm, containing a few scattered low level internal echoes.  Left Kidney: Length: 11.6 cm. Cortical thinning. Upper normal cortical echogenicity. Cyst at inferior pole 2.7 x 2.2 x 2.7 cm. No additional mass or hydronephrosis.  Abdominal aorta: Aneurysmal dilatation of the mid abdominal aorta up to 3.9 x 3.9 cm.  Other findings: No free-fluid  IMPRESSION: Aneurysmal dilatation of the mid abdominal aorta up to 3.9 x 3.9 cm.  Small BILATERAL inferior pole renal cysts, minimally complicated on RIGHT.  BILATERAL renal cortical atrophy.  Incomplete pancreatic visualization.   Electronically Signed   By: Lavonia Dana M.D.   On: 11/21/2014 10:56     EKG  Interpretation   Date/Time:  Thursday November 21 2014 09:01:26 EDT Ventricular Rate:  63 PR Interval:    QRS Duration: 103 QT Interval:  436 QTC Calculation: 446 R Axis:   68 Text Interpretation:  Atrial fibrillation Non-specific ST-t changes  Confirmed by Makiyla Linch  MD, Lovinia Snare ([id number]) on 11/21/2014 11:06:59 AM      MDM   Final diagnoses:  Right-sided chest pain   73yM with R sided CP/RUQ pain. Some RUQ tenderness on exam. Imaging as above. Known AAA. Doubt symptomatic. Doubt ACS. Doubt dissection or other emergent process. It has been determined that no acute conditions requiring further emergency intervention are present at this time. The patient has been advised of the diagnosis and plan. I reviewed any labs and imaging including any potential incidental findings. We have discussed signs and symptoms that warrant return to the ED and they are listed in the discharge instructions.    I personally preformed the services scribed in my presence. The recorded information has been reviewed is accurate. Virgel Manifold, MD.      Virgel Manifold, MD 11/27/14 [phone]

## 2014-11-21 NOTE — Discharge Instructions (Signed)

## 2014-11-27 ENCOUNTER — Other Ambulatory Visit (HOSPITAL_COMMUNITY): Payer: Self-pay | Admitting: Internal Medicine

## 2014-11-27 DIAGNOSIS — R1031 Right lower quadrant pain: Secondary | ICD-10-CM

## 2014-11-29 ENCOUNTER — Ambulatory Visit (HOSPITAL_COMMUNITY)
Admission: RE | Admit: 2014-11-29 | Discharge: 2014-11-29 | Disposition: A | Discharge status: Home / Self Care | Payer: PPO | Source: Ambulatory Visit | Attending: Internal Medicine | Admitting: Internal Medicine

## 2014-11-29 DIAGNOSIS — R1031 Right lower quadrant pain: Secondary | ICD-10-CM

## 2014-11-29 MED ORDER — IOHEXOL 300 MG/ML  SOLN
100.0000 mL | Freq: Once | INTRAMUSCULAR | Status: AC | PRN
  Administered 2014-11-29: 100 mL via INTRAVENOUS

## 2014-12-13 ENCOUNTER — Encounter: Payer: Self-pay | Admitting: Cardiovascular Disease

## 2014-12-13 ENCOUNTER — Ambulatory Visit (INDEPENDENT_AMBULATORY_CARE_PROVIDER_SITE_OTHER): Payer: PPO | Admitting: Cardiovascular Disease

## 2014-12-13 VITALS — BP 122/78 | HR 59 | Ht 69.0 in | Wt 220.8 lb

## 2014-12-13 DIAGNOSIS — I714 Abdominal aortic aneurysm, without rupture, unspecified: Secondary | ICD-10-CM

## 2014-12-13 DIAGNOSIS — I10 Essential (primary) hypertension: Secondary | ICD-10-CM | POA: Diagnosis not present

## 2014-12-13 DIAGNOSIS — I482 Chronic atrial fibrillation, unspecified: Secondary | ICD-10-CM

## 2014-12-13 DIAGNOSIS — Z87898 Personal history of other specified conditions: Secondary | ICD-10-CM

## 2014-12-13 DIAGNOSIS — Z79899 Other long term (current) drug therapy: Secondary | ICD-10-CM | POA: Diagnosis not present

## 2014-12-13 DIAGNOSIS — Z7189 Other specified counseling: Secondary | ICD-10-CM

## 2014-12-13 DIAGNOSIS — E785 Hyperlipidemia, unspecified: Secondary | ICD-10-CM

## 2014-12-13 DIAGNOSIS — Z9289 Personal history of other medical treatment: Secondary | ICD-10-CM

## 2014-12-13 MED ORDER — APIXABAN 2.5 MG PO TABS: 2.5000 mg | ORAL_TABLET | Freq: Two times a day (BID) | ORAL | Status: DC

## 2014-12-13 NOTE — Progress Notes (Signed)
Apt made with Dr Kellie Simmering for 12/17/14 at 9:30 ma, NPO after MN, [place] first followed by apt

## 2014-12-13 NOTE — Progress Notes (Signed)
Patient ID: [name], male   DOB: [birth year], 74 y.o.   MRN: [id number]      SUBJECTIVE: Patient has a history of chronic atrial fibrillation, hypertension, and infrarenal abdominal aortic aneurysm. Reportedly not an anticoagulation candidate due to prior intraparenchymal renal hemorrhage and patient preference. Patient had been on warfarin at that time. However, Dr. Izell Harris note on 05/06/12 does mention "reconsideration will be given to full anticoagulation as he continues to age". This is my first time meeting him.  Evaluated in ED for chest and abdominal pain in 10/2014. Troponin normal, ECG showed atrial fibrillation, HR 63 bpm.  Chest xray without edema/consolidation.  Abd CT on 11/29/14 showed 4 cm AAA and diverticulosis.  Echocardiogram on 06/23/11 showed normal LV systolic function, LVEF 61-60%, moderate LVH.  They reportedly have had difficulty contacting Dr. Evelena Leyden office for AAA evaluation.  Denies chest pain, palpitations, leg swelling, and shortness of breath. Tried Xarelto in past but became pale and then developed leg redness, thus discontinued.  No abdominal pain today.  Soc: He and his wife are originally from [place]. Moved to [place] in early-mid 80's.   Review of Systems: As per "subjective", otherwise negative.  Allergies  Allergen Reactions  . Statins     Muscle aches    Current Outpatient Prescriptions  Medication Sig Dispense Refill  . aspirin 81 MG tablet Take 81 mg by mouth daily.    . Azilsartan Medoxomil (EDARBI) 40 MG TABS Take 20 mg by mouth daily.    . cetirizine (ZYRTEC) 10 MG tablet Take 10 mg by mouth daily.    Marland Kitchen diltiazem (CARDIZEM CD) 240 MG 24 hr capsule Take 240 mg by mouth daily.    . fluticasone (FLONASE) 50 MCG/ACT nasal spray Place 2 sprays into both nostrils daily.    . meclizine (ANTIVERT) 25 MG tablet Take 25 mg by mouth 2 (two) times daily as needed. For dizziness    . metoprolol succinate (TOPROL-XL) 25 MG 24 hr tablet Take 25  mg by mouth daily.    . Multiple Vitamin (MULTIVITAMIN) tablet Take 1 tablet by mouth daily.    . pravastatin (PRAVACHOL) 20 MG tablet TAKE ONE TABLET BY MOUTH ONCE EVERY EVENING. 90 tablet 3  . traZODone (DESYREL) 50 MG tablet Take 50 mg by mouth at bedtime.    . triamterene-hydrochlorothiazide (MAXZIDE-25) 37.5-25 MG per tablet Take 1 tablet by mouth daily.    . metoprolol succinate (TOPROL-XL) 25 MG 24 hr tablet Take 1 tablet (25 mg total) by mouth daily. 30 tablet 6   No current facility-administered medications for this visit.    Past Medical History  Diagnosis Date  . Peripheral vascular disease     Small AAA (3.7-4cm 09/2010), small SMA aneurysm (69mm)  . Hypertension     Admitted for chest pain and 06/2011-neg stress Myoview,mod. LVH with nl EF on echo; TSH of 3.75  . Hyperlipidemia     06/2011:122, 84, 41, 64  . Chronic atrial fibrillation     Low to moderate risk for thromboembolism with mild ASVD and low LAA velocity+ hypertension and moderately advanced age  . Meniere's disease   . Degenerative joint disease   . Pneumonia   . Renal hemorrhage, right 2009    2009 while anticoagulated with Coumadin; treated with the renal artery embolization  . Nephrolithiasis   . Aneurysm     Past Surgical History  Procedure Laterality Date  . Pilonidal cyst excision    . Orif femur fracture  History   Social History  . Marital Status: Married    Spouse Name: N/A  . Number of Children: 2  . Years of Education: N/A   Occupational History  . Retired     Chief Technology Officer job   Social History Main Topics  . Smoking status: Former Smoker -- 1.00 packs/day for 40 years    Quit date: 01/20/2008  . Smokeless tobacco: Never Used     Comment: Smoked for 40 years, quit 2009  . Alcohol Use: 0.6 oz/week    1 Standard drinks or equivalent per week  . Drug Use: No  . Sexual Activity: Not Currently   Other Topics Concern  . Not on file   Social History Narrative      Filed Vitals:   12/13/14 1318  BP: 122/78  Pulse: 59  Height: 5\' 9"  (1.753 m)  Weight: 220 lb 12.8 oz (100.154 kg)  SpO2: 98%    PHYSICAL EXAM General: NAD HEENT: Normal. Neck: No JVD, no thyromegaly. Lungs: Clear to auscultation bilaterally with normal respiratory effort. CV: Regular rate and irregular rhythm, normal S1/S2, no S3, no murmur. No pretibial or periankle edema.  No carotid bruit.  Abdomen: Soft, nontender, obese, no distention.  Neurologic: Alert and oriented x 3.  Psych: Normal affect. Skin: Normal. Musculoskeletal: No gross deformities. Extremities: No clubbing or cyanosis.   ECG: Most recent ECG reviewed.      ASSESSMENT AND PLAN: 1. Chronic atrial fibrillation: HR controlled on long-acting diltiazem and metoprolol. CHA2DS2VASC score is 3 (HTN, age, PVD), thus anticoagulation warranted for thromboembolic risk reduction. Currently on ASA 81 mg. Will d/c ASA and give samples for 2.5 mg bid of apixaban. If he tolerates, will prescribe at this dose. Will obtain CBC one week after commencing therapy.  2. Essential HTN: Well controlled. No changes.  3. Hyperlipidemia: Obtain copy of lipids. Continue pravastatin 20 mg.  4. AAA: Will assist in contacting vascular surgery office (Dr. Kellie Simmering).  Dispo: f/u 1 year.  Time spent: 40 minutes, of which greater than 50% was spent reviewing symptoms, relevant blood tests and studies, and discussing management plan with the patient.   Kate Sable, M.D., F.A.C.C.

## 2014-12-13 NOTE — Patient Instructions (Signed)
Your physician wants you to follow-up in: 1 year You will receive a reminder letter in the mail two months in advance. If you don't receive a letter, please call our office to schedule the follow-up appointment.    Stop Aspirin  START Eliquis 2.5 mg twice a day , please calll back in 1 week and let us know if you tolerate it   Please get CBC lab work in 1 week  Make apt with Edrick Oh RN in 1 month

## 2014-12-16 ENCOUNTER — Encounter: Payer: Self-pay | Admitting: Vascular Surgery

## 2014-12-17 ENCOUNTER — Ambulatory Visit (HOSPITAL_COMMUNITY): Admission: RE | Admit: 2014-12-17 | Payer: PPO | Source: Ambulatory Visit

## 2014-12-17 ENCOUNTER — Encounter: Payer: Self-pay | Admitting: Vascular Surgery

## 2014-12-17 ENCOUNTER — Ambulatory Visit (INDEPENDENT_AMBULATORY_CARE_PROVIDER_SITE_OTHER): Payer: PPO | Admitting: Vascular Surgery

## 2014-12-17 VITALS — BP 132/83 | HR 55 | Ht 69.0 in | Wt 217.2 lb

## 2014-12-17 DIAGNOSIS — I714 Abdominal aortic aneurysm, without rupture, unspecified: Secondary | ICD-10-CM

## 2014-12-17 NOTE — Addendum Note (Signed)
Addended by: Dorthula Rue L on: 12/17/2014 03:18 PM   Modules accepted: Orders

## 2014-12-17 NOTE — Progress Notes (Signed)
Subjective:     Patient ID: [name], male   DOB: Jun 02, 1940, 73 y.o.   MRN: [id number]  HPI This 74 year old male returns for continued follow-up regarding an abdominal aortic aneurysm. I last saw him in 2013 and the aneurysm was about 3.8 cm in maximum diameter. He recently had smoked nominal discomfort which lasted about 1 hour. He was seen in the emergency department and eventually had CT scan which I have reviewed. This revealed the aneurysm to be approximately 4 cm in maximum diameter with no evidence of leakage. Patient is seen today in continued follow-up. He does have some mild claudication type symptoms in both legs after walking several blocks which is not limiting him.  Past Medical History  Diagnosis Date  . Peripheral vascular disease     Small AAA (3.7-4cm 09/2010), small SMA aneurysm (32mm)  . Hypertension     Admitted for chest pain and 06/2011-neg stress Myoview,mod. LVH with nl EF on echo; TSH of 3.75  . Hyperlipidemia     06/2011:122, 84, 41, 64  . Chronic atrial fibrillation     Low to moderate risk for thromboembolism with mild ASVD and low LAA velocity+ hypertension and moderately advanced age  . Meniere's disease   . Degenerative joint disease   . Pneumonia   . Renal hemorrhage, right 2009    2009 while anticoagulated with Coumadin; treated with the renal artery embolization  . Nephrolithiasis   . Aneurysm     History  Substance Use Topics  . Smoking status: Former Smoker -- 1.00 packs/day for 40 years    Quit date: 01/20/2008  . Smokeless tobacco: Never Used     Comment: Smoked for 40 years, quit 2009  . Alcohol Use: 0.6 oz/week    1 Standard drinks or equivalent per week    Family History  Problem Relation Age of Onset  . Hypertension Mother   . Cancer Mother   . Hyperlipidemia Mother   . Atrial fibrillation Sister   . Stroke Father     Allergies  Allergen Reactions  . Statins     Muscle aches     Current outpatient prescriptions:  .   apixaban (ELIQUIS) 2.5 MG TABS tablet, Take 1 tablet (2.5 mg total) by mouth 2 (two) times daily., Disp: 60 tablet, Rfl: 6 .  Azilsartan Medoxomil (EDARBI) 40 MG TABS, Take 20 mg by mouth daily., Disp: , Rfl:  .  cetirizine (ZYRTEC) 10 MG tablet, Take 10 mg by mouth daily., Disp: , Rfl:  .  diltiazem (CARDIZEM CD) 240 MG 24 hr capsule, Take 240 mg by mouth daily., Disp: , Rfl:  .  fluticasone (FLONASE) 50 MCG/ACT nasal spray, Place 2 sprays into both nostrils daily., Disp: , Rfl:  .  meclizine (ANTIVERT) 25 MG tablet, Take 25 mg by mouth 2 (two) times daily as needed. For dizziness, Disp: , Rfl:  .  metoprolol succinate (TOPROL-XL) 25 MG 24 hr tablet, Take 25 mg by mouth daily., Disp: , Rfl:  .  Multiple Vitamin (MULTIVITAMIN) tablet, Take 1 tablet by mouth daily., Disp: , Rfl:  .  pravastatin (PRAVACHOL) 20 MG tablet, TAKE ONE TABLET BY MOUTH ONCE EVERY EVENING., Disp: 90 tablet, Rfl: 3 .  traZODone (DESYREL) 50 MG tablet, Take 50 mg by mouth at bedtime., Disp: , Rfl:  .  triamterene-hydrochlorothiazide (MAXZIDE-25) 37.5-25 MG per tablet, Take 1 tablet by mouth daily., Disp: , Rfl:  .  metoprolol succinate (TOPROL-XL) 25 MG 24 hr tablet, Take 1 tablet (  25 mg total) by mouth daily., Disp: 30 tablet, Rfl: 6  Filed Vitals:   12/17/14 0937  BP: 132/83  Pulse: 55  Height: 5\' 9"  (1.753 m)  Weight: 217 lb 3.2 oz (98.521 kg)  SpO2: 96%    Body mass index is 32.06 kg/(m^2).           Review of Systems Has history of chronic A. Fib. Also history of tobacco abuse quit smoking in 2009 at which time he had early COPD. Denies anycardiac symptoms suchas chest ain dyspnea on exertion PND orthopna or hemoptysis. Other systems negative nd complete review of systems     Objective:   Physical Exam BP 132/83 mmHg  Pulse 55  Ht 5\' 9"  (1.753 m)  Wt 217 lb 3.2 oz (98.521 kg)  BMI 32.06 kg/m2  SpO2 96%  Gen.-alert and oriented x3 in no apparent distress HEENT normal for age Lungs no rhonchi or  wheezing Cardiovascular regular rhythm no murmurs carotid pulses 3+ palpable no bruits audible Abdomen soft nontender no palpable masses Musculoskeletal free of  major deformities Skin clear -no rashes Neurologic normal Lower extremities 3+ femoral and  Posterior tibial pulses palpable bilaterally with no edema   today I reviewed the CT scan of the abdomen and pelvis which was performed on 11/29/2014. I reviewed the images by computer and reviewed the report. Patient does have a 4 cm abdominal aortic aneurysm which extends above the renal arteries. Diameter of the aorta at the renal arteries is approximately 3 cm with mural thrombus extending up to the level of the SMA posteriorly.       Assessment:      4 cm abdominal aortic aneurysm which does extend above the level of renal arteries proximally -not a stent graft candidate  Aneurysm has not changed significantly in size over the past few years  -approximately 4 cm     Plan:      we'll contiue to follow patient on annual basis. He will return in 1 year for duplex scan of abdominal aortic aneurysm and see nurse practitioner.  Discussed symptoms of leakage or rupture with patient and this occurs she will report to emergency department for examination

## 2014-12-21 LAB — CBC
HCT: 40.7 % (ref 39.0–52.0)
Hemoglobin: 13.9 g/dL (ref 13.0–17.0)
MCH: 30.7 pg (ref 26.0–34.0)
MCHC: 34.2 g/dL (ref 30.0–36.0)
MCV: 89.8 fL (ref 78.0–100.0)
MPV: 9.8 fL (ref 8.6–12.4)
Platelets: 166 10*3/uL (ref 150–400)
RBC: 4.53 MIL/uL (ref 4.22–5.81)
RDW: 14.1 % (ref 11.5–15.5)
WBC: 5.9 10*3/uL (ref 4.0–10.5)

## 2014-12-24 ENCOUNTER — Telehealth: Payer: Self-pay | Admitting: Cardiovascular Disease

## 2014-12-24 MED ORDER — APIXABAN 2.5 MG PO TABS: 2.5000 mg | ORAL_TABLET | Freq: Two times a day (BID) | ORAL | Status: DC

## 2014-12-24 NOTE — Telephone Encounter (Signed)
Dr. [name] gave the pt samples of Eliquis and told him to call if everything was going ok while taking the meds, pt states that everything is going good and would like a Rx of Eliquis

## 2014-12-30 ENCOUNTER — Telehealth: Payer: Self-pay | Admitting: *Deleted

## 2014-12-30 NOTE — Telephone Encounter (Signed)
Patient would like return call regarding "bleeding incident" he had over the weekend / tg

## 2014-12-30 NOTE — Telephone Encounter (Signed)
Pt had 1 episode of dark stool with fresh bright red blood present on Saturday and Sunday.  Does not have hemorrhoids that he is aware of.  Pt stopped Eliquis yesterday until he discussed with [place] today.  Stool has been normal today.  Pt has also been taking meloxicam bid for arthritis.  Told pt to restart eliquis tonight, stay off meloxicam for now and call if he notices blood in stool again.  He may take Tylenol for arthritis.  Pt verbalized understanding.

## 2015-01-20 ENCOUNTER — Ambulatory Visit (INDEPENDENT_AMBULATORY_CARE_PROVIDER_SITE_OTHER): Payer: PPO | Admitting: *Deleted

## 2015-01-20 DIAGNOSIS — I482 Chronic atrial fibrillation: Secondary | ICD-10-CM | POA: Diagnosis not present

## 2015-01-20 NOTE — Progress Notes (Signed)
Pt was started on Eliquis 2.5mg  bid for atrial fib on 12/13/14 by Dr [name].    Pt had 1 episode of of dark, tarry stool with bright red streaks reported on 8/1.  He held Eliquis 1 night till he talked to me.  He resumed Eliquis the next night.  He has not had any problems since.  He did stop meloxicam that he was taking.  Labs: 11/21/14  SrCr 1.12  Hgb 15.4  Hct 44.8      12/20/14  Hgb 13.9   Hct 40.7  Reviewed patients medication list.  Pt is not currently on any combined P-gp and strong CYP3A4 inhibitors/inducers (ketoconazole, traconazole, ritonavir, carbamazepine, phenytoin, rifampin, St. John's wort).  Reviewed labs from 01/21/15 drawn at Dr Earnest Conroy. Hall's office.  SCr 1.13, Weight 217, CrCl 83.48.  Dose is appropriate based on 2 out of 3 criteria (age,wt,SrCr).   Hgb and HCT 14.6/41.8.  A full discussion of the nature of anticoagulants has been carried out.  A benefit/risk analysis has been presented to the patient, so that they understand the justification for choosing anticoagulation with Eliquis at this time.  The need for compliance is stressed.  Pt is aware to take the medication twice daily.  Side effects of potential bleeding are discussed, including unusual colored urine or stools, coughing up blood or coffee ground emesis, nose bleeds or serious fall or head trauma.  Discussed signs and symptoms of stroke. The patient should avoid any OTC items containing aspirin or ibuprofen.  Avoid alcohol consumption.   Call if any signs of abnormal bleeding.  Discussed financial obligations and resolved any difficulty in obtaining medication.  Next lab test test in 6 months.   6 month appt put in recall.

## 2015-02-06 ENCOUNTER — Telehealth: Payer: Self-pay | Admitting: Cardiovascular Disease

## 2015-02-06 ENCOUNTER — Emergency Department (HOSPITAL_COMMUNITY): Payer: PPO

## 2015-02-06 ENCOUNTER — Encounter (HOSPITAL_COMMUNITY): Payer: Self-pay | Admitting: Emergency Medicine

## 2015-02-06 ENCOUNTER — Emergency Department (HOSPITAL_COMMUNITY)
Admission: EM | Admit: 2015-02-06 | Discharge: 2015-02-06 | Disposition: A | Discharge status: Home / Self Care | Payer: PPO | Attending: Emergency Medicine | Admitting: Emergency Medicine

## 2015-02-06 DIAGNOSIS — Y9389 Activity, other specified: Secondary | ICD-10-CM | POA: Insufficient documentation

## 2015-02-06 DIAGNOSIS — Z87442 Personal history of urinary calculi: Secondary | ICD-10-CM | POA: Insufficient documentation

## 2015-02-06 DIAGNOSIS — E785 Hyperlipidemia, unspecified: Secondary | ICD-10-CM | POA: Diagnosis not present

## 2015-02-06 DIAGNOSIS — S0990XA Unspecified injury of head, initial encounter: Secondary | ICD-10-CM | POA: Insufficient documentation

## 2015-02-06 DIAGNOSIS — Z87448 Personal history of other diseases of urinary system: Secondary | ICD-10-CM | POA: Diagnosis not present

## 2015-02-06 DIAGNOSIS — R519 Headache, unspecified: Secondary | ICD-10-CM

## 2015-02-06 DIAGNOSIS — Z79899 Other long term (current) drug therapy: Secondary | ICD-10-CM | POA: Diagnosis not present

## 2015-02-06 DIAGNOSIS — R51 Headache: Secondary | ICD-10-CM

## 2015-02-06 DIAGNOSIS — Y9289 Other specified places as the place of occurrence of the external cause: Secondary | ICD-10-CM | POA: Diagnosis not present

## 2015-02-06 DIAGNOSIS — Z8739 Personal history of other diseases of the musculoskeletal system and connective tissue: Secondary | ICD-10-CM | POA: Insufficient documentation

## 2015-02-06 DIAGNOSIS — Z8669 Personal history of other diseases of the nervous system and sense organs: Secondary | ICD-10-CM | POA: Diagnosis not present

## 2015-02-06 DIAGNOSIS — Z8701 Personal history of pneumonia (recurrent): Secondary | ICD-10-CM | POA: Insufficient documentation

## 2015-02-06 DIAGNOSIS — I10 Essential (primary) hypertension: Secondary | ICD-10-CM | POA: Diagnosis not present

## 2015-02-06 DIAGNOSIS — W01198A Fall on same level from slipping, tripping and stumbling with subsequent striking against other object, initial encounter: Secondary | ICD-10-CM | POA: Diagnosis not present

## 2015-02-06 DIAGNOSIS — Z87891 Personal history of nicotine dependence: Secondary | ICD-10-CM | POA: Insufficient documentation

## 2015-02-06 DIAGNOSIS — Y998 Other external cause status: Secondary | ICD-10-CM | POA: Diagnosis not present

## 2015-02-06 LAB — CBC WITH DIFFERENTIAL/PLATELET
Basophils Absolute: 0 10*3/uL (ref 0.0–0.1)
Basophils Relative: 0 % (ref 0–1)
Eosinophils Absolute: 0 10*3/uL (ref 0.0–0.7)
Eosinophils Relative: 0 % (ref 0–5)
HCT: 43.3 % (ref 39.0–52.0)
Hemoglobin: 14.7 g/dL (ref 13.0–17.0)
Lymphocytes Relative: 3 % — ABNORMAL LOW (ref 12–46)
Lymphs Abs: 0.4 10*3/uL — ABNORMAL LOW (ref 0.7–4.0)
MCH: 31.2 pg (ref 26.0–34.0)
MCHC: 33.9 g/dL (ref 30.0–36.0)
MCV: 91.9 fL (ref 78.0–100.0)
Monocytes Absolute: 0.9 10*3/uL (ref 0.1–1.0)
Monocytes Relative: 7 % (ref 3–12)
Neutro Abs: 12.2 10*3/uL — ABNORMAL HIGH (ref 1.7–7.7)
Neutrophils Relative %: 90 % — ABNORMAL HIGH (ref 43–77)
Platelets: 187 10*3/uL (ref 150–400)
RBC: 4.71 MIL/uL (ref 4.22–5.81)
RDW: 12.9 % (ref 11.5–15.5)
WBC: 13.5 10*3/uL — ABNORMAL HIGH (ref 4.0–10.5)

## 2015-02-06 MED ORDER — TRAMADOL HCL 50 MG PO TABS: 50.0000 mg | ORAL_TABLET | Freq: Four times a day (QID) | ORAL | Status: DC | PRN

## 2015-02-06 MED ORDER — MORPHINE SULFATE (PF) 4 MG/ML IV SOLN
2.0000 mg | Freq: Once | INTRAVENOUS | Status: AC
  Administered 2015-02-06: 2 mg via INTRAVENOUS
  Filled 2015-02-06: qty 1

## 2015-02-06 NOTE — ED Notes (Signed)
Pt c/o headache that started about 2 weeks ago after fall. Pt denies LOC during fall 2 weeks ago. Pt said he felt that it was normal to have a headache after the fall but it has increasingly gotten worse since then. Pt reports episode of nausea this morning and says he has been having some trouble with his gallbladder and is unsure if it was due to his gallbladder or correlating with his headache. Pt denies nausea at this time. Pt has light yellow colored bruise to right upper forehead from fall and yellow/green colored bruise to right upper arm. Abrasion to left lower anterior side of leg from fall.

## 2015-02-06 NOTE — ED Provider Notes (Signed)
CSN: [id number]     Arrival date & time 02/06/15  1352 History   First MD Initiated Contact with Patient 02/06/15 1456     Chief Complaint  Patient presents with  . Headache     (Consider location/radiation/quality/duration/timing/severity/associated sxs/prior Treatment) HPI Comments: HA X > 1 week, bitemporal, throbbing - started after hitting head during a fall - no associated sob / visual changes or numbness / weakness / balance problems - has had ongoing constant pain X 7 days - notes that this tarted after fall and a couple of weeks after staring eliquis (has been on coumadin and xarelto in the past but has had GI bleeding - has noted GI bleeding (small am't ) this week.)  Currently sx are throbbing mild.  No hx of headaches - taking tylenol without improvement.  Patient is a 74 y.o. male presenting with headaches. The history is provided by the patient and the spouse.  Headache   Past Medical History  Diagnosis Date  . Peripheral vascular disease     Small AAA (3.7-4cm 09/2010), small SMA aneurysm (65mm)  . Hypertension     Admitted for chest pain and 06/2011-neg stress Myoview,mod. LVH with nl EF on echo; TSH of 3.75  . Hyperlipidemia     06/2011:122, 84, 41, 64  . Chronic atrial fibrillation     Low to moderate risk for thromboembolism with mild ASVD and low LAA velocity+ hypertension and moderately advanced age  . Meniere's disease   . Degenerative joint disease   . Pneumonia   . Renal hemorrhage, right 2009    2009 while anticoagulated with Coumadin; treated with the renal artery embolization  . Nephrolithiasis   . Aneurysm    Past Surgical History  Procedure Laterality Date  . Pilonidal cyst excision    . Orif femur fracture     Family History  Problem Relation Age of Onset  . Hypertension Mother   . Cancer Mother   . Hyperlipidemia Mother   . Atrial fibrillation Sister   . Stroke Father    Social History  Substance Use Topics  . Smoking status: Former Smoker  -- 1.00 packs/day for 40 years    Quit date: 01/20/2008  . Smokeless tobacco: Never Used     Comment: Smoked for 40 years, quit 2009  . Alcohol Use: No    Review of Systems  Neurological: Positive for headaches.  All other systems reviewed and are negative.     Allergies  Statins  Home Medications   Prior to Admission medications   Medication Sig Start Date End Date Taking? Authorizing Provider  apixaban (ELIQUIS) 2.5 MG TABS tablet Take 1 tablet (2.5 mg total) by mouth 2 (two) times daily. 12/24/14  Yes Satira Sark, MD  Azilsartan Medoxomil (EDARBI) 40 MG TABS Take 20 mg by mouth daily.   Yes Historical Provider, MD  cetirizine (ZYRTEC) 10 MG tablet Take 10 mg by mouth daily.   Yes Historical Provider, MD  diltiazem (CARDIZEM CD) 240 MG 24 hr capsule Take 240 mg by mouth daily.   Yes Historical Provider, MD  meclizine (ANTIVERT) 25 MG tablet Take 25 mg by mouth 2 (two) times daily as needed. For dizziness   Yes Historical Provider, MD  metoprolol succinate (TOPROL-XL) 25 MG 24 hr tablet Take 1 tablet (25 mg total) by mouth daily. 06/24/11 02/06/15 Yes Jessica A Hope, PA-C  Multiple Vitamin (MULTIVITAMIN) tablet Take 1 tablet by mouth daily.   Yes Historical Provider, MD  pravastatin (PRAVACHOL)  20 MG tablet TAKE ONE TABLET BY MOUTH ONCE EVERY EVENING. 04/03/13  Yes Lendon Colonel, NP  traZODone (DESYREL) 50 MG tablet Take 50 mg by mouth at bedtime.   Yes Historical Provider, MD  triamterene-hydrochlorothiazide (MAXZIDE-25) 37.5-25 MG per tablet Take 1 tablet by mouth daily. 09/20/11  Yes Yehuda Savannah, MD  traMADol (ULTRAM) 50 MG tablet Take 1 tablet (50 mg total) by mouth every 6 (six) hours as needed. 02/06/15   Noemi Chapel, MD   BP 110/77 mmHg  Pulse 79  Temp(Src) 97.5 F (36.4 C) (Oral)  Resp 13  Ht 5\' 10"  (1.778 m)  Wt 215 lb (97.523 kg)  BMI 30.85 kg/m2  SpO2 95% Physical Exam  Constitutional: He appears well-developed and well-nourished. No distress.  HENT:   Head: Normocephalic and atraumatic.  Mouth/Throat: Oropharynx is clear and moist. No oropharyngeal exudate.  No ttp over the temporal arteries.  Eyes: Conjunctivae and EOM are normal. Pupils are equal, round, and reactive to light. Right eye exhibits no discharge. Left eye exhibits no discharge. No scleral icterus.  Neck: Normal range of motion. Neck supple. No JVD present. No thyromegaly present.  Cardiovascular: Normal rate, normal heart sounds and intact distal pulses.  Exam reveals no gallop and no friction rub.   No murmur heard. afib present, strong pulses, rate controlled  Pulmonary/Chest: Effort normal and breath sounds normal. No respiratory distress. He has no wheezes. He has no rales.  Abdominal: Soft. Bowel sounds are normal. He exhibits no distension and no mass. There is no tenderness.  Musculoskeletal: Normal range of motion. He exhibits no edema or tenderness.  Lymphadenopathy:    He has no cervical adenopathy.  Neurological: He is alert. Coordination normal.  Neurologic exam:  Speech clear, pupils equal round reactive to light, extraocular movements intact  Normal peripheral visual fields Cranial nerves III through XII normal including no facial droop Follows commands, moves all extremities x4, normal strength to bilateral upper and lower extremities at all major muscle groups including grip Sensation normal to light touch and pinprick Coordination intact, no limb ataxia, finger-nose-finger normal Rapid alternating movements normal No pronator drift Gait normal   Skin: Skin is warm and dry. No rash noted. No erythema.  Psychiatric: He has a normal mood and affect. His behavior is normal.  Nursing note and vitals reviewed.   ED Course  Procedures (including critical care time) Labs Review Labs Reviewed  CBC WITH DIFFERENTIAL/PLATELET - Abnormal; Notable for the following:    WBC 13.5 (*)    Neutrophils Relative % 90 (*)    Neutro Abs 12.2 (*)    Lymphocytes  Relative 3 (*)    Lymphs Abs 0.4 (*)    All other components within normal limits    Imaging Review Ct Head Wo Contrast  02/06/2015   CLINICAL DATA:  Headache and nausea for 1 day.  Fall 1 week prior  EXAM: CT HEAD WITHOUT CONTRAST  TECHNIQUE: Contiguous axial images were obtained from the base of the skull through the vertex without intravenous contrast.  COMPARISON:  None.  FINDINGS: The ventricles are normal in size and configuration. 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 Each of these frontal hygromas causes mass effect on the respective of frontal horns. The thickness of the subdural hygroma on the right is 1.0 cm. The maximum thickness of the subdural hygroma on the left is 9 mm. No acute appearing subdural or epidural fluid collections are identified. There  is no intra-axial mass. No acute hemorrhage seen on this study. No midline shift. The gray-white compartments appear normal. No acute infarct. The bony calvarium appears intact. The mastoid air cells are clear. There is rightward deviation of the nasal septum.  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 Each of these subdural hygromas causes apparent mass effect on the respective frontal lobe. No acute hemorrhage intra-axially or extra- axially is seen. No acute infarct evident. No midline shift. There is no mass or intracranial edema.   Electronically Signed   By: Lowella Grip III M.D.   On: 02/06/2015 16:27   I have personally reviewed and evaluated these images and lab results as part of my medical decision-making.   MDM   Final diagnoses:  Headache    Meds given, check CBC, CT head r/o hemorrhage - possible post concussive.  Anticipate benefit of stopping eliquis given bleeding risk.  Pt informed of results including CT scan - and labs - will f/u with outpt PCP and NS - referral [facility]  numbers given, pt in agreement   Meds given in ED:  Medications  morphine 4 MG/ML injection 2 mg (2 mg Intravenous Given 02/06/15 1601)    New Prescriptions   TRAMADOL (ULTRAM) 50 MG TABLET    Take 1 tablet (50 mg total) by mouth every 6 (six) hours as needed.      Noemi Chapel, MD 02/06/15 [phone]

## 2015-02-06 NOTE — ED Notes (Signed)
Pt reports headache and nausea beginning this AM. Pt has bruise to right upper cheek from fall that occurred last week. Pt reports taking blood thinner.

## 2015-02-06 NOTE — ED Notes (Signed)
PA at bedside.

## 2015-02-06 NOTE — Telephone Encounter (Signed)
Called with no answer.  Left message on machine.  According to Epic pt is in the ED at this time with c/o headache.

## 2015-02-06 NOTE — Telephone Encounter (Signed)
Patient wife called stating that he is having a lot of issues with medication

## 2015-02-06 NOTE — Discharge Instructions (Signed)

## 2015-02-07 NOTE — Telephone Encounter (Signed)
Mrs. Wilmeth called back.  Husband was sent home from the ED last night.  CT showed bilateral subdural hygromas but no bleed.  Pt was left on Eliquis and has a referal to Neurology.

## 2015-02-10 ENCOUNTER — Other Ambulatory Visit (HOSPITAL_COMMUNITY): Payer: Self-pay | Admitting: Neurosurgery

## 2015-02-10 DIAGNOSIS — S065XAA Traumatic subdural hemorrhage with loss of consciousness status unknown, initial encounter: Secondary | ICD-10-CM | POA: Insufficient documentation

## 2015-02-10 DIAGNOSIS — T148XXA Other injury of unspecified body region, initial encounter: Secondary | ICD-10-CM

## 2015-02-10 DIAGNOSIS — S065X9A Traumatic subdural hemorrhage with loss of consciousness of unspecified duration, initial encounter: Secondary | ICD-10-CM | POA: Insufficient documentation

## 2015-02-19 ENCOUNTER — Ambulatory Visit (HOSPITAL_COMMUNITY)
Admission: RE | Admit: 2015-02-19 | Discharge: 2015-02-19 | Disposition: A | Discharge status: Home / Self Care | Payer: PPO | Source: Ambulatory Visit | Attending: Neurosurgery | Admitting: Neurosurgery

## 2015-02-19 DIAGNOSIS — S065X9A Traumatic subdural hemorrhage with loss of consciousness of unspecified duration, initial encounter: Secondary | ICD-10-CM | POA: Diagnosis present

## 2015-02-19 DIAGNOSIS — D181 Lymphangioma, any site: Secondary | ICD-10-CM | POA: Insufficient documentation

## 2015-02-19 DIAGNOSIS — X58XXXA Exposure to other specified factors, initial encounter: Secondary | ICD-10-CM | POA: Insufficient documentation

## 2015-02-19 DIAGNOSIS — T148XXA Other injury of unspecified body region, initial encounter: Secondary | ICD-10-CM

## 2015-06-02 DIAGNOSIS — R1031 Right lower quadrant pain: Secondary | ICD-10-CM | POA: Diagnosis not present

## 2015-06-02 DIAGNOSIS — R110 Nausea: Secondary | ICD-10-CM | POA: Diagnosis not present

## 2015-06-03 ENCOUNTER — Other Ambulatory Visit (HOSPITAL_COMMUNITY): Payer: Self-pay | Admitting: Internal Medicine

## 2015-06-03 ENCOUNTER — Ambulatory Visit (HOSPITAL_COMMUNITY)
Admission: RE | Admit: 2015-06-03 | Discharge: 2015-06-03 | Disposition: A | Discharge status: Home / Self Care | Payer: PPO | Source: Ambulatory Visit | Attending: Internal Medicine | Admitting: Internal Medicine

## 2015-06-03 DIAGNOSIS — I2510 Atherosclerotic heart disease of native coronary artery without angina pectoris: Secondary | ICD-10-CM | POA: Diagnosis not present

## 2015-06-03 DIAGNOSIS — I714 Abdominal aortic aneurysm, without rupture: Secondary | ICD-10-CM | POA: Diagnosis not present

## 2015-06-03 DIAGNOSIS — R1031 Right lower quadrant pain: Secondary | ICD-10-CM

## 2015-06-03 DIAGNOSIS — K5730 Diverticulosis of large intestine without perforation or abscess without bleeding: Secondary | ICD-10-CM | POA: Diagnosis not present

## 2015-06-03 DIAGNOSIS — K810 Acute cholecystitis: Secondary | ICD-10-CM | POA: Diagnosis not present

## 2015-06-03 DIAGNOSIS — K4090 Unilateral inguinal hernia, without obstruction or gangrene, not specified as recurrent: Secondary | ICD-10-CM | POA: Diagnosis not present

## 2015-06-03 LAB — POCT I-STAT CREATININE: Creatinine, Ser: 1.1 mg/dL (ref 0.61–1.24)

## 2015-06-03 MED ORDER — IOHEXOL 300 MG/ML  SOLN: 100.0000 mL | Freq: Once | INTRAMUSCULAR | Status: DC | PRN

## 2015-06-05 ENCOUNTER — Other Ambulatory Visit (HOSPITAL_COMMUNITY): Payer: Self-pay | Admitting: General Surgery

## 2015-06-05 DIAGNOSIS — R1011 Right upper quadrant pain: Secondary | ICD-10-CM

## 2015-06-05 DIAGNOSIS — K8020 Calculus of gallbladder without cholecystitis without obstruction: Secondary | ICD-10-CM | POA: Diagnosis not present

## 2015-06-06 ENCOUNTER — Ambulatory Visit (HOSPITAL_COMMUNITY)
Admission: RE | Admit: 2015-06-06 | Discharge: 2015-06-06 | Disposition: A | Discharge status: Home / Self Care | Payer: PPO | Source: Ambulatory Visit | Attending: General Surgery | Admitting: General Surgery

## 2015-06-06 DIAGNOSIS — K8020 Calculus of gallbladder without cholecystitis without obstruction: Secondary | ICD-10-CM | POA: Insufficient documentation

## 2015-06-06 DIAGNOSIS — R1011 Right upper quadrant pain: Secondary | ICD-10-CM | POA: Insufficient documentation

## 2015-06-06 DIAGNOSIS — R932 Abnormal findings on diagnostic imaging of liver and biliary tract: Secondary | ICD-10-CM | POA: Diagnosis not present

## 2015-06-10 DIAGNOSIS — K8020 Calculus of gallbladder without cholecystitis without obstruction: Secondary | ICD-10-CM | POA: Diagnosis not present

## 2015-07-15 DIAGNOSIS — R739 Hyperglycemia, unspecified: Secondary | ICD-10-CM | POA: Diagnosis not present

## 2015-07-15 DIAGNOSIS — E782 Mixed hyperlipidemia: Secondary | ICD-10-CM | POA: Diagnosis not present

## 2015-07-16 DIAGNOSIS — R7301 Impaired fasting glucose: Secondary | ICD-10-CM | POA: Diagnosis not present

## 2015-07-16 DIAGNOSIS — I10 Essential (primary) hypertension: Secondary | ICD-10-CM | POA: Diagnosis not present

## 2015-07-16 DIAGNOSIS — K829 Disease of gallbladder, unspecified: Secondary | ICD-10-CM | POA: Diagnosis not present

## 2015-07-16 DIAGNOSIS — I482 Chronic atrial fibrillation: Secondary | ICD-10-CM | POA: Diagnosis not present

## 2015-07-16 DIAGNOSIS — E782 Mixed hyperlipidemia: Secondary | ICD-10-CM | POA: Diagnosis not present

## 2015-07-16 DIAGNOSIS — R51 Headache: Secondary | ICD-10-CM | POA: Diagnosis not present

## 2015-07-16 DIAGNOSIS — Q120 Congenital cataract: Secondary | ICD-10-CM | POA: Diagnosis not present

## 2015-12-18 ENCOUNTER — Encounter: Payer: Self-pay | Admitting: Family

## 2015-12-23 ENCOUNTER — Encounter: Payer: Self-pay | Admitting: Family

## 2015-12-23 ENCOUNTER — Ambulatory Visit (INDEPENDENT_AMBULATORY_CARE_PROVIDER_SITE_OTHER): Payer: PPO | Admitting: Family

## 2015-12-23 ENCOUNTER — Ambulatory Visit (HOSPITAL_COMMUNITY)
Admission: RE | Admit: 2015-12-23 | Discharge: 2015-12-23 | Disposition: A | Discharge status: Home / Self Care | Payer: PPO | Source: Ambulatory Visit | Attending: Family | Admitting: Family

## 2015-12-23 VITALS — BP 126/88 | HR 55 | Temp 97.5°F | Ht 69.0 in | Wt 205.3 lb

## 2015-12-23 DIAGNOSIS — I714 Abdominal aortic aneurysm, without rupture, unspecified: Secondary | ICD-10-CM

## 2015-12-23 DIAGNOSIS — E785 Hyperlipidemia, unspecified: Secondary | ICD-10-CM | POA: Insufficient documentation

## 2015-12-23 DIAGNOSIS — I10 Essential (primary) hypertension: Secondary | ICD-10-CM | POA: Insufficient documentation

## 2015-12-23 DIAGNOSIS — I723 Aneurysm of iliac artery: Secondary | ICD-10-CM | POA: Diagnosis not present

## 2015-12-23 NOTE — Progress Notes (Signed)
[facility] [place]   CC: Follow up Abdominal Aortic Aneurysm  History of Present Illness  [name] is a 75 y.o. (26-Sep-1940) male patient of Dr. Kellie Simmering returns for continued follow-up regarding an abdominal aortic aneurysm. He recently had nominal discomfort which lasted about 1 hour. He was seen in the emergency department and eventually had CT scan which Dr. Kellie Simmering reviewed at pt's visit on 12/17/14. This revealed the aneurysm to be approximately 4 cm in maximum diameter with no evidence of leakage. Patient is seen today in continued follow-up. He does have some mild claudication type symptoms in both legs after walking several blocks which is not limiting him. Pt's wife states that pt had bleeding from a renal artery in 2009 caused a great deal of internal bleeding, was in ICU for a week, states this artery was cauterized. He was on  Warfarin at that time for a-fib; has not been on warfarin since then. He takes ASA and rate control medications.  Pt denies any know hx of stroke or TIA.  He had another CTA abd/pelvis on 06/03/15 requested by Dr. Nevada Crane, pt's PCP, to evaluate abdominal pain, see results below. Dr. Kellie Simmering last saw pt on 12/17/14. At that time CT showed a 4 cm abdominal aortic aneurysm which does extend above the level of renal arteries proximally -not a stent graft candidate  Aneurysm has not changed significantly in size over the past few years  -approximately 4 cm. He reports occasional right groin pain which he states may be a hernia.  Pt reports chronic low back pain, no new back pain.  The patient reports tired feeling in both calves if he walks about 1/4 mile uphill; states Dr. Lattie Haw requested and pt had ABI's performed years ago, states this was normal.   Pt Diabetic: No Pt smoker: former smoker, quit in 2009  Past Medical History:  Diagnosis Date  . Aneurysm ([place])   . Chronic atrial fibrillation (HCC)    Low to moderate risk for  thromboembolism with mild ASVD and low LAA velocity+ hypertension and moderately advanced age  . Degenerative joint disease   . Hyperlipidemia    06/2011:122, 84, 41, 64  . Hypertension    Admitted for chest pain and 06/2011-neg stress Myoview,mod. LVH with nl EF on echo; TSH of 3.75  . Meniere's disease   . Nephrolithiasis   . 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 Pneumonia   . Renal hemorrhage, right 2009   2009 while anticoagulated with Coumadin; treated with the renal artery embolization   Past Surgical History:  Procedure Laterality Date  . ORIF FEMUR FRACTURE    . PILONIDAL CYST EXCISION     Social History Social History   Social History  . Marital status: Married    Spouse name: N/A  . Number of children: 2  . Years of education: N/A   Occupational History  . Retired     Chief Technology Officer job   Social History Main Topics  . Smoking status: Former Smoker    Packs/day: 1.00    Years: 40.00    Quit date: 01/20/2008  . Smokeless tobacco: Never Used     Comment: Smoked for 40 years, quit 2009  . Alcohol use No  . Drug use: No  . Sexual activity: Not Currently   Other Topics Concern  . Not on file   Social History Narrative  . No narrative on file  Family History Family History  Problem Relation Age of Onset  . Hypertension Mother   . Cancer Mother   . Hyperlipidemia Mother   . Atrial fibrillation Sister   . Stroke Father     Current Outpatient Prescriptions on File Prior to Visit  Medication Sig Dispense Refill  . cetirizine (ZYRTEC) 10 MG tablet Take 10 mg by mouth daily.    Marland Kitchen diltiazem (CARDIZEM CD) 240 MG 24 hr capsule Take 240 mg by mouth daily.    . meclizine (ANTIVERT) 25 MG tablet Take 25 mg by mouth 2 (two) times daily as needed. For dizziness    . Multiple Vitamin (MULTIVITAMIN) tablet Take 1 tablet by mouth daily.    . pravastatin (PRAVACHOL) 20 MG tablet TAKE ONE TABLET BY  MOUTH ONCE EVERY EVENING. 90 tablet 3  . traZODone (DESYREL) 50 MG tablet Take 50 mg by mouth at bedtime.    . triamterene-hydrochlorothiazide (MAXZIDE-25) 37.5-25 MG per tablet Take 1 tablet by mouth daily.    . metoprolol succinate (TOPROL-XL) 25 MG 24 hr tablet Take 1 tablet (25 mg total) by mouth daily. 30 tablet 6   No current facility-administered medications on file prior to visit.    Allergies  Allergen Reactions  . Statins     Muscle aches    ROS: See HPI for pertinent positives and negatives.  Physical Examination  Vitals:   12/23/15 0900  BP: 126/88  Pulse: (!) 55  Temp: 97.5 F (36.4 C)  Weight: 205 lb 5 oz (93.1 kg)  Height: 5\' 9"  (1.753 m)   Body mass index is 30.32 kg/m.  General: A&O x 3, WD, obese male.  Pulmonary: Sym exp, good air movt, CTAB, no rales, rhonchi, or wheezing.  Cardiac: RRR, Nl S1, S2, no detected murmur.   Carotid Bruits Right Left   Negative Negative   Aorta is not palpable Radial pulses are 2+ palpable and =                          VASCULAR EXAM:                                                                                                         LE Pulses Right Left       FEMORAL   palpable   palpable        POPLITEAL  not palpable   not palpable       POSTERIOR TIBIAL   palpable    palpable     Gastrointestinal: soft, NTND, -G/R, - HSM, - masses palpated, - CVAT B.  Musculoskeletal: M/S 5/5 throughout, Extremities without ischemic changes.  Neurologic: CN 2-12 intact, Pain and light touch intact in extremities are intact, Motor exam as listed above.   10/11/11/ ABI's: At rest, the right ABI is 1.23, left 1.21. Triphasic waveforms are noted throughout both lower extremities on Doppler interrogation. Pulse volume recording is normal bilaterally. Exercise testing was not performed. IMPRESSION: 1. No evidence of hemodynamically significant lower extremity arterial occlusive disease at rest  CTA abd/pelvis  on 06/03/15 requested by Dr. Nevada Crane, to evaluate abdominal pain: IMPRESSION: 1. Acute Cholecystitis. Right Upper Quadrant Ultrasound would be complementary in this setting for treatment planning. 2. No other acute or inflammatory process in the abdomen or pelvis. Normal appendix. 3. Chronic infrarenal abdominal aortic aneurysm is stable since July, maximum diameter 43-44 mm. Major branches of the abdominal aorta appears stable. Ectatic and calcified bilateral iliac arteries are stable. Recommend followup by ultrasound in 1 year. This recommendation follows ACR consensus guidelines: White Paper of the ACR Incidental Findings Committee II on Vascular Findings. J Am Coll Radiol 2013; 10:789-794. 4. Severe colonic diverticulosis with no active inflammation.   Non-Invasive Vascular Imaging  AAA Duplex (12/23/2015)  Previous size: 4.4 cm (Date: 06/03/15 CT)  Current size:  4.4 cm (Date: 12/23/15), right CIA: 1.6 cm, Left CIA: 2.1 cm  Medical Decision Making  The patient is a 75 y.o. male who presents with asymptomatic AAA with no increase in size. Right CIA aneurysm at 2.1 cm.   Based on this patient's exam and diagnostic studies, the patient will follow up in 6 months with the following studies: AAA duplex.  Consideration for repair of AAA would be made when the size is 5.5 cm, growth > 1 cm/yr, and symptomatic status.  I emphasized the importance of maximal medical management including strict control of blood pressure, blood glucose, and lipid levels, antiplatelet agents, obtaining regular exercise, and continued cessation of smoking.   The patient was given information about AAA including signs, symptoms, treatment, and how to minimize the risk of enlargement and rupture of aneurysms.    The patient was advised to call 911 should the patient experience sudden onset abdominal or back pain.   Thank you for allowing [place] to participate in this patient's care.  Clemon Chambers, RN, MSN,  FNP-C [facility] [place] Office: [phone]  Clinic Physician: Early  12/23/2015, 9:33 AM

## 2015-12-23 NOTE — Patient Instructions (Addendum)
Abdominal Aortic Aneurysm An aneurysm is a weakened or damaged part of an artery wall that bulges from the normal force of blood pumping through the body. An abdominal aortic aneurysm is an aneurysm that occurs in the lower part of the aorta, the main artery of the body.  The major concern with an abdominal aortic aneurysm is that it can enlarge and burst (rupture) or blood can flow between the layers of the wall of the aorta through a tear (aorticdissection). Both of these conditions can cause bleeding inside the body and can be life threatening unless diagnosed and treated promptly. CAUSES  The exact cause of an abdominal aortic aneurysm is unknown. Some contributing factors are:   A hardening of the arteries caused by the buildup of fat and other substances in the lining of a blood vessel (arteriosclerosis).  Inflammation of the walls of an artery (arteritis).   Connective tissue diseases, such as Marfan syndrome.   Abdominal trauma.   An infection, such as syphilis or staphylococcus, in the wall of the aorta (infectious aortitis) caused by bacteria. RISK FACTORS  Risk factors that contribute to an abdominal aortic aneurysm may include:  Age older than 60 years.   High blood pressure (hypertension).  Male gender.  Ethnicity (white race).  Obesity.  Family history of aneurysm (first degree relatives only).  Tobacco use. PREVENTION  The following healthy lifestyle habits may help decrease your risk of abdominal aortic aneurysm:  Quitting smoking. Smoking can raise your blood pressure and cause arteriosclerosis.  Limiting or avoiding alcohol.  Keeping your blood pressure, blood sugar level, and cholesterol levels within normal limits.  Decreasing your salt intake. In somepeople, too much salt can raise blood pressure and increase your risk of abdominal aortic aneurysm.  Eating a diet low in saturated fats and cholesterol.  Increasing your fiber intake by including  whole grains, vegetables, and fruits in your diet. Eating these foods may help lower blood pressure.  Maintaining a healthy weight.  Staying physically active and exercising regularly. SYMPTOMS  The symptoms of abdominal aortic aneurysm may vary depending on the size and rate of growth of the aneurysm.Most grow slowly and do not have any symptoms. When symptoms do occur, they may include:  Pain (abdomen, side, lower back, or groin). The pain may vary in intensity. A sudden onset of severe pain may indicate that the aneurysm has ruptured.  Feeling full after eating only small amounts of food.  Nausea or vomiting or both.  Feeling a pulsating lump in the abdomen.  Feeling faint or passing out. DIAGNOSIS  Since most unruptured abdominal aortic aneurysms have no symptoms, they are often discovered during diagnostic exams for other conditions. An aneurysm may be found during the following procedures:  Ultrasonography (A one-time screening for abdominal aortic aneurysm by ultrasonography is also recommended for all men aged 65-75 years who have ever smoked).  X-ray exams.  A computed tomography (CT).  Magnetic resonance imaging (MRI).  Angiography or arteriography. TREATMENT  Treatment of an abdominal aortic aneurysm depends on the size of your aneurysm, your age, and risk factors for rupture. Medication to control blood pressure and pain may be used to manage aneurysms smaller than 6 cm. Regular monitoring for enlargement may be recommended by your caregiver if:  The aneurysm is 3-4 cm in size (an annual ultrasonography may be recommended).  The aneurysm is 4-4.5 cm in size (an ultrasonography every 6 months may be recommended).  The aneurysm is larger than 4.5 cm in   size (your caregiver may ask that you be examined by a vascular surgeon). If your aneurysm is larger than 6 cm, surgical repair may be recommended. There are two main methods for repair of an aneurysm:   Endovascular  repair (a minimally invasive surgery). This is done most often.  Open repair. This method is used if an endovascular repair is not possible.   This information is not intended to replace advice given to you by your health care provider. Make sure you discuss any questions you have with your health care provider.   Document Released: 02/24/2005 Document Revised: 09/11/2012 Document Reviewed: 06/16/2012 Elsevier Interactive Patient Education 2016 Elsevier Inc.    Before your next abdominal ultrasound:  Take two Extra-Strength Gas-X capsules at bedtime the night before the test. Take another two Extra-Strength Gas-X capsules 3 hours before the test.   

## 2016-01-27 DIAGNOSIS — I10 Essential (primary) hypertension: Secondary | ICD-10-CM | POA: Diagnosis not present

## 2016-01-27 DIAGNOSIS — Z125 Encounter for screening for malignant neoplasm of prostate: Secondary | ICD-10-CM | POA: Diagnosis not present

## 2016-01-27 DIAGNOSIS — E782 Mixed hyperlipidemia: Secondary | ICD-10-CM | POA: Diagnosis not present

## 2016-01-27 DIAGNOSIS — R7301 Impaired fasting glucose: Secondary | ICD-10-CM | POA: Diagnosis not present

## 2016-01-29 DIAGNOSIS — D181 Lymphangioma, any site: Secondary | ICD-10-CM | POA: Diagnosis not present

## 2016-01-29 DIAGNOSIS — I712 Thoracic aortic aneurysm, without rupture: Secondary | ICD-10-CM | POA: Diagnosis not present

## 2016-01-29 DIAGNOSIS — I482 Chronic atrial fibrillation: Secondary | ICD-10-CM | POA: Diagnosis not present

## 2016-01-29 DIAGNOSIS — R7303 Prediabetes: Secondary | ICD-10-CM | POA: Diagnosis not present

## 2016-01-29 DIAGNOSIS — Q120 Congenital cataract: Secondary | ICD-10-CM | POA: Diagnosis not present

## 2016-01-29 DIAGNOSIS — E782 Mixed hyperlipidemia: Secondary | ICD-10-CM | POA: Diagnosis not present

## 2016-01-29 DIAGNOSIS — I10 Essential (primary) hypertension: Secondary | ICD-10-CM | POA: Diagnosis not present

## 2016-02-18 ENCOUNTER — Ambulatory Visit (INDEPENDENT_AMBULATORY_CARE_PROVIDER_SITE_OTHER): Payer: PPO | Admitting: Cardiovascular Disease

## 2016-02-18 ENCOUNTER — Encounter: Payer: Self-pay | Admitting: Cardiovascular Disease

## 2016-02-18 VITALS — BP 128/68 | HR 56 | Ht 69.0 in | Wt 206.0 lb

## 2016-02-18 DIAGNOSIS — E785 Hyperlipidemia, unspecified: Secondary | ICD-10-CM

## 2016-02-18 DIAGNOSIS — I482 Chronic atrial fibrillation, unspecified: Secondary | ICD-10-CM

## 2016-02-18 DIAGNOSIS — Z7189 Other specified counseling: Secondary | ICD-10-CM

## 2016-02-18 DIAGNOSIS — I10 Essential (primary) hypertension: Secondary | ICD-10-CM

## 2016-02-18 DIAGNOSIS — I714 Abdominal aortic aneurysm, without rupture, unspecified: Secondary | ICD-10-CM

## 2016-02-18 NOTE — Progress Notes (Addendum)
SUBJECTIVE: [name] presents for routine follow up. He has a history of chronic atrial fibrillation, hypertension, and infrarenal abdominal aortic aneurysm.  Abd CT on 06/03/15 showed 4.3-4.4 cm AAA and diverticulosis.  Echocardiogram on 06/23/11 showed normal LV systolic function, LVEF [id number], moderate LVH.  Had been on Eliquis, now on ASA again. He has had issues with rectal bleeding and prefers not to be on any form of anticoagulation.  Labs 01/27/16 hemoglobin 15.1, total cholesterol 149, triglycerides 142, HDL 46, LDL 75.  ECG performed the office today which I personally interpreted demonstrated atrial fibrillation with nonspecific T-wave abnormalities, heart rate 53 bpm.  Soc: He and his wife are originally from [place]. Moved to [place] in early-mid 80's.  Review of Systems: As per "subjective", otherwise negative.  Allergies  Allergen Reactions  . Statins     Muscle aches    Current Outpatient Prescriptions  Medication Sig Dispense Refill  . aspirin 81 MG tablet Take 81 mg by mouth daily.    . cetirizine (ZYRTEC) 10 MG tablet Take 10 mg by mouth daily.    Marland Kitchen diltiazem (CARDIZEM CD) 240 MG 24 hr capsule Take 240 mg by mouth daily.    . meclizine (ANTIVERT) 25 MG tablet Take 25 mg by mouth 2 (two) times daily as needed. For dizziness    . Multiple Vitamin (MULTIVITAMIN) tablet Take 1 tablet by mouth daily.    . pravastatin (PRAVACHOL) 20 MG tablet TAKE ONE TABLET BY MOUTH ONCE EVERY EVENING. 90 tablet 3  . traZODone (DESYREL) 50 MG tablet Take 50 mg by mouth at bedtime.    . triamterene-hydrochlorothiazide (MAXZIDE-25) 37.5-25 MG per tablet Take 1 tablet by mouth daily.    . metoprolol succinate (TOPROL-XL) 25 MG 24 hr tablet Take 1 tablet (25 mg total) by mouth daily. 30 tablet 6   No current facility-administered medications for this visit.     Past Medical History:  Diagnosis Date  . Aneurysm ([place])   . Chronic atrial fibrillation (HCC)    Low to moderate risk  for thromboembolism with mild ASVD and low LAA velocity+ hypertension and moderately advanced age  . Degenerative joint disease   . Hyperlipidemia    06/2011:122, 84, 41, 64  . Hypertension    Admitted for chest pain and 06/2011-neg stress Myoview,mod. LVH with nl EF on echo; TSH of 3.75  . Meniere's disease   . Nephrolithiasis   . Peripheral vascular disease ([place])    Small AAA (3.7-4cm 09/2010), small SMA aneurysm (39mm)  . Pneumonia   . Renal hemorrhage, right 2009   2009 while anticoagulated with Coumadin; treated with the renal artery embolization    Past Surgical History:  Procedure Laterality Date  . ORIF FEMUR FRACTURE    . PILONIDAL CYST EXCISION      Social History   Social History  . Marital status: Married    Spouse name: N/A  . Number of children: 2  . Years of education: N/A   Occupational History  . Retired     Chief Technology Officer job   Social History Main Topics  . Smoking status: Former Smoker    Packs/day: 1.00    Years: 40.00    Quit date: 01/20/2008  . Smokeless tobacco: Never Used     Comment: Smoked for 40 years, quit 2009  . Alcohol use No  . Drug use: No  . Sexual activity: Not Currently   Other Topics Concern  . Not on file  Social History Narrative  . No narrative on file     Vitals:   02/18/16 1259  BP: 128/68  Pulse: (!) 56  SpO2: 96%  Weight: 206 lb (93.4 kg)  Height: 5\' 9"  (1.753 m)    PHYSICAL EXAM General: NAD HEENT: Normal. Neck: No JVD, no thyromegaly. Lungs: Clear to auscultation bilaterally with normal respiratory effort. CV: Nondisplaced PMI.  Bradycardic, irregular rhythm, normal S1/S2, no S3, no murmur. No pretibial or periankle edema.  No carotid bruit.   Abdomen: Soft, nontender, no distention.  Neurologic: Alert and oriented.  Psych: Normal affect. Skin: Normal. Musculoskeletal: No gross deformities.    ECG: Most recent ECG reviewed.      ASSESSMENT AND PLAN: 1. Chronic atrial  fibrillation: HR controlled on long-acting diltiazem and metoprolol. CHA2DS2VASC score is 3 (HTN, age, PVD), thus anticoagulation warranted for thromboembolic risk reduction. Currently on ASA 81 mg. he prefers not to be on anticoagulation due to rectal bleeding.  I will make a referral to Dr. Rayann Heman to determine Watchman device candidacy.  2. Essential HTN: Well controlled. No changes.  3. Hyperlipidemia: Lipids reviewed above. Continue pravastatin 20 mg.  4. AAA: Stable, reviewed above. Follows with vascular surgery.  Dispo: f/u 1 year.   Kate Sable, M.D., F.A.C.C.  ADDENDUM:  I have seen [name] is a 75 y.o. male in the office today. He has a history of atrial fibrillation. This patients CHA2DS2-VASc Score and unadjusted Ischemic Stroke Rate (% per year) is equal to 3.2 % stroke rate/year from a score of 3 which necessitates long term oral anticoagulation to prevent stroke. Unfortunately, He is not felt to be a long term Warfarin candidate secondary to prior GI hemorrhage, intolerance of Xarelto and rectal bleeding. The patients chart has been reviewed and I feel that they would be a candidate for short term oral anticoagulation.  Given the patient's poor candidacy for long-term oral anticoagulation, ability to tolerate short term oral anticoagulation I have recommended the watchman left atrial appendage closure system.

## 2016-02-18 NOTE — Patient Instructions (Signed)
Medication Instructions:  Your physician recommends that you continue on your current medications as directed. Please refer to the Current Medication list given to you today.   Labwork: none  Testing/Procedures: none  Follow-Up: Your physician wants you to follow-up in: 1 year .  You will receive a reminder letter in the mail two months in advance. If you don't receive a letter, please call our office to schedule the follow-up appointment.   Any Other Special Instructions Will Be Listed Below (If Applicable).  I have placed a referral with Dr. Rayann Heman to discuss the watchman device, someone from his office will contact you soon.   If you need a refill on your cardiac medications before your next appointment, please call your pharmacy.

## 2016-02-27 ENCOUNTER — Encounter: Payer: Self-pay | Admitting: Internal Medicine

## 2016-02-29 HISTORY — PX: OTHER SURGICAL HISTORY: SHX169

## 2016-03-01 ENCOUNTER — Encounter: Payer: Self-pay | Admitting: Internal Medicine

## 2016-03-01 ENCOUNTER — Ambulatory Visit (INDEPENDENT_AMBULATORY_CARE_PROVIDER_SITE_OTHER): Payer: PPO | Admitting: Internal Medicine

## 2016-03-01 VITALS — BP 110/66 | HR 52 | Ht 69.0 in | Wt 208.0 lb

## 2016-03-01 DIAGNOSIS — I10 Essential (primary) hypertension: Secondary | ICD-10-CM | POA: Diagnosis not present

## 2016-03-01 DIAGNOSIS — I714 Abdominal aortic aneurysm, without rupture, unspecified: Secondary | ICD-10-CM

## 2016-03-01 DIAGNOSIS — I482 Chronic atrial fibrillation, unspecified: Secondary | ICD-10-CM

## 2016-03-01 NOTE — Progress Notes (Signed)
Electrophysiology Office Note   Date:  03/01/2016   ID:  MOUSA HAUBER, DOB Jan 24, 1941, MRN [id number]  PCP:  [name], MD  Cardiologist:  Dr [name] Primary Electrophysiologist: [name], MD    Chief Complaint  Patient presents with  . Atrial Fibrillation     History of Present Illness: [name] is a 75 y.o. male who presents today for electrophysiology evaluation.   He has longstanding persistent afib.  He is minimally symptomatic and reasonably active for his age.  He has had difficulty with anticoagulation.  He reports GI hemorrhage previously with coumadin.  He tried xarelto but developed redness of both legs.  HE then switched briefly to eliquis but noticed BRBPR for which he stopped this medicine.  Currently, he is anticoagulated with ASA only.  He is followed by Dr [name].  He is referred today for consideration of Watchman LAAO procedure.   Today, he denies symptoms of palpitations, chest pain, shortness of breath, orthopnea, PND, lower extremity edema, claudication, dizziness, presyncope, syncope, bleeding, or neurologic sequela. The patient is tolerating medications without difficulties and is otherwise without complaint today.    Past Medical History:  Diagnosis Date  . Aneurysm ([place])   . Chronic atrial fibrillation (HCC)    Low to moderate risk for thromboembolism with mild ASVD and low LAA velocity+ hypertension and moderately advanced age  . Degenerative joint disease   . Hyperlipidemia    06/2011:122, 84, 41, 64  . Hypertension    Admitted for chest pain and 06/2011-neg stress Myoview,mod. LVH with nl EF on echo; TSH of 3.75  . Meniere's disease   . Nephrolithiasis   . 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 Pneumonia   . Renal hemorrhage, right 2009   2009 while anticoagulated with Coumadin; treated with the renal artery embolization   Past Surgical History:  Procedure Laterality Date    . ORIF FEMUR FRACTURE    . PILONIDAL CYST EXCISION       Current Outpatient Prescriptions  Medication Sig Dispense Refill  . acetaminophen (TYLENOL) 500 MG tablet Take 1,000 mg by mouth every 8 (eight) hours as needed (pain).    Marland Kitchen aspirin 81 MG tablet Take 81 mg by mouth daily.    . cetirizine (ZYRTEC) 10 MG tablet Take 10 mg by mouth daily.    Marland Kitchen diltiazem (CARDIZEM CD) 240 MG 24 hr capsule Take 240 mg by mouth daily.    . fluticasone (FLONASE) 50 MCG/ACT nasal spray Place 1 spray into both nostrils every evening.    . meclizine (ANTIVERT) 25 MG tablet Take 25 mg by mouth 2 (two) times daily. For dizziness    . metoprolol succinate (TOPROL-XL) 25 MG 24 hr tablet Take 1 tablet (25 mg total) by mouth daily. 30 tablet 6  . Multiple Vitamin (MULTIVITAMIN) tablet Take 1 tablet by mouth daily.    . pravastatin (PRAVACHOL) 20 MG tablet TAKE ONE TABLET BY MOUTH ONCE EVERY EVENING. 90 tablet 3  . traZODone (DESYREL) 50 MG tablet Take 50 mg by mouth at bedtime.    . triamterene-hydrochlorothiazide (MAXZIDE-25) 37.5-25 MG per tablet Take 1 tablet by mouth daily.     No current facility-administered medications for this visit.     Allergies:   Statins   Social History:  The patient  reports that he quit smoking about 8 years ago. He has a 40.00 pack-year smoking history. He has never used smokeless tobacco.  He reports that he does not drink alcohol or use drugs.   Family History:  The patient's family history includes Atrial fibrillation in his sister; Cancer in his mother; Hyperlipidemia in his mother; Hypertension in his mother; Stroke in his father.    ROS:  Please see the history of present illness.   All other systems are reviewed and negative.    PHYSICAL EXAM: VS:  BP 110/66   Pulse (!) 52   Ht 5\' 9"  (1.753 m)   Wt 208 lb (94.3 kg)   BMI 30.72 kg/m  , BMI Body mass index is 30.72 kg/m. GEN: Well nourished, well developed, in no acute distress  HEENT: normal  Neck: no JVD,  carotid bruits, or masses Cardiac: iRRR; no murmurs, rubs, or gallops,no edema  Respiratory:  clear to auscultation bilaterally, normal work of breathing GI: soft, nontender, nondistended, + BS MS: no deformity or atrophy  Skin: warm and dry  Neuro:  Strength and sensation are intact Psych: euthymic mood, full affect  EKG:  EKG is ordered today. The ekg ordered today shows afib, V rate 52 bpm, nonspecific St/ T changes   Recent Labs: 06/03/2015: Creatinine, Ser 1.10    Lipid Panel     Component Value Date/Time   CHOL 161 12/08/2012 0705   TRIG 165 (H) 12/08/2012 0705   HDL 37 (L) 12/08/2012 0705   CHOLHDL 4.4 12/08/2012 0705   VLDL 33 12/08/2012 0705   LDLCALC 91 12/08/2012 0705     Wt Readings from Last 3 Encounters:  03/01/16 208 lb (94.3 kg)  02/18/16 206 lb (93.4 kg)  12/23/15 205 lb 5 oz (93.1 kg)      Other studies Reviewed: Additional studies/ records that were reviewed today include: Dr Raylene Everts notes, labs, prior echo 2013   ASSESSMENT AND PLAN:  1.  Persistent afib The patient has minimally symptomatic persistent afib.  I have seen [name] is a 75 y.o. male in the office today who has been referred by Dr [name] for a Watchman left atrial appendage closure device.   This patients CHA2DS2-VASc Score and unadjusted Ischemic Stroke Rate (% per year) is equal to 3.2 % stroke rate/year from a score of 3 which necessitates long term oral anticoagulation to prevent stroke.  Unfortunately, He is not felt to be a long term Warfarin candidate secondary to GI bleeding.  The patients chart has been reviewed and I along with their referring cardiologist feel that they would be a candidate for short term oral anticoagulation.  Procedural risks for the Watchman implant have been reviewed with the patient including a 1% risk of stroke, 2% risk of perforation, 0.1% risk of device embolization.  Given the patient's poor candidacy for long-term oral anticoagulation,  ability to tolerate short term oral anticoagulation, I have recommended the watchman left atrial appendage closure system.  TEE will be scheduled to review LAA anatomy.  The patient understands that the ability to implant Watchman is dependent on results of the TEE.  If patient is candidate for Watchman based on TEE results, we will schedule the procedure at the next available time.  He would prefer anticoagulation with eliquis rather than coumadin at time of the procedure.  Continue rate control long term for anticoagulation.  Current medicines are reviewed at length with the patient today.   The patient does not have concerns regarding his medicines.  The following changes were made today:  none  Signed, [name], MD  03/01/2016 8:33 PM  [address] [place] [address] [phone] (office) [phone] (fax)

## 2016-03-01 NOTE — Patient Instructions (Addendum)
Medication Instructions:  Your physician recommends that you continue on your current medications as directed. Please refer to the Current Medication list given to you today.   Labwork: None ordered   Testing/Procedures: Your physician has requested that you have a TEE. During a TEE, sound waves are used to create images of your heart. It provides your doctor with information about the size and shape of your heart and how well your heart's chambers and valves are working. In this test, a transducer is attached to the end of a flexible tube that's guided down your throat and into your esophagus (the tube leading from you mouth to your stomach) to get a more detailed image of your heart. You are not awake for the procedure. Please see the instruction sheet given to you today. For further information please visit [email or web address]   Please arrive at [facility] At 10:30am Do not eat or drink after midnight the night prior to the procedure Okay to take your medications with a small sip of water the morning of  You will need someone to drive you home after the procedure       Follow-Up: Your physician recommends that you schedule a follow-up appointment Chanetta Marshall, NP will call you after the test to discuss plan.     Any Other Special Instructions Will Be Listed Below (If Applicable).     If you need a refill on your cardiac medications before your next appointment, please call your pharmacy.

## 2016-03-05 ENCOUNTER — Telehealth: Payer: Self-pay | Admitting: Internal Medicine

## 2016-03-05 ENCOUNTER — Telehealth: Payer: Self-pay | Admitting: Nurse Practitioner

## 2016-03-05 NOTE — Telephone Encounter (Signed)
Spoke with Loma Sousa at [employer]. She states that request for Watchmen was sent to be done expedited, but at 1500 on Friday this cannot be done since expedited orders are to be done in 2 days. She states the procedure is scheduled 10/19 and if run standard, the pre-cert will be completed on time. Agreed with Loma Sousa that as long as certification is complete by time of procedure, it can be run as standard.

## 2016-03-05 NOTE — Telephone Encounter (Signed)
Spoke with patient to follow up on consult last week with Dr Rayann Heman. TEE scheduled for 10-11 with Dr Johnsie Cancel for [facility] screening.  If LAA acceptable for Watchman, pt would like to proceed on 10-19.  I will call patient next week after TEE to discuss further.  Orders entered for TEE.  Chanetta Marshall, NP 03/05/2016 9:54 AM

## 2016-03-05 NOTE — Telephone Encounter (Signed)
[name] is calling because a request for a watchman device was sent as expedited and they have to send to be reviewed by [employer] and is wanting to know if it can be done as standard since the procedure is not scheduled until 03/18/16 . Being that it was sent as an expedited order it will not be done in time and needs to be change as standard. She needs someone to call her back today before they close at 5pm. Thanks

## 2016-03-10 ENCOUNTER — Encounter (HOSPITAL_COMMUNITY)
Admission: RE | Disposition: A | Discharge status: Home / Self Care | Payer: Self-pay | Source: Ambulatory Visit | Attending: Cardiovascular Disease

## 2016-03-10 ENCOUNTER — Encounter (HOSPITAL_COMMUNITY): Payer: Self-pay | Admitting: *Deleted

## 2016-03-10 ENCOUNTER — Ambulatory Visit (HOSPITAL_BASED_OUTPATIENT_CLINIC_OR_DEPARTMENT_OTHER): Payer: PPO

## 2016-03-10 ENCOUNTER — Ambulatory Visit (HOSPITAL_COMMUNITY)
Admission: RE | Admit: 2016-03-10 | Discharge: 2016-03-10 | Disposition: A | Discharge status: Home / Self Care | Payer: PPO | Source: Ambulatory Visit | Attending: Cardiovascular Disease | Admitting: Cardiovascular Disease

## 2016-03-10 DIAGNOSIS — Z8249 Family history of ischemic heart disease and other diseases of the circulatory system: Secondary | ICD-10-CM | POA: Diagnosis not present

## 2016-03-10 DIAGNOSIS — I7090 Unspecified atherosclerosis: Secondary | ICD-10-CM | POA: Insufficient documentation

## 2016-03-10 DIAGNOSIS — Z7951 Long term (current) use of inhaled steroids: Secondary | ICD-10-CM | POA: Diagnosis not present

## 2016-03-10 DIAGNOSIS — Z87891 Personal history of nicotine dependence: Secondary | ICD-10-CM | POA: Diagnosis not present

## 2016-03-10 DIAGNOSIS — I080 Rheumatic disorders of both mitral and aortic valves: Secondary | ICD-10-CM | POA: Insufficient documentation

## 2016-03-10 DIAGNOSIS — I340 Nonrheumatic mitral (valve) insufficiency: Secondary | ICD-10-CM | POA: Diagnosis not present

## 2016-03-10 DIAGNOSIS — I739 Peripheral vascular disease, unspecified: Secondary | ICD-10-CM | POA: Diagnosis not present

## 2016-03-10 DIAGNOSIS — E785 Hyperlipidemia, unspecified: Secondary | ICD-10-CM | POA: Insufficient documentation

## 2016-03-10 DIAGNOSIS — Z79899 Other long term (current) drug therapy: Secondary | ICD-10-CM | POA: Insufficient documentation

## 2016-03-10 DIAGNOSIS — Z7982 Long term (current) use of aspirin: Secondary | ICD-10-CM | POA: Insufficient documentation

## 2016-03-10 DIAGNOSIS — H8109 Meniere's disease, unspecified ear: Secondary | ICD-10-CM | POA: Diagnosis not present

## 2016-03-10 DIAGNOSIS — I714 Abdominal aortic aneurysm, without rupture: Secondary | ICD-10-CM | POA: Diagnosis not present

## 2016-03-10 DIAGNOSIS — I481 Persistent atrial fibrillation: Secondary | ICD-10-CM | POA: Diagnosis not present

## 2016-03-10 DIAGNOSIS — I10 Essential (primary) hypertension: Secondary | ICD-10-CM | POA: Insufficient documentation

## 2016-03-10 DIAGNOSIS — I482 Chronic atrial fibrillation: Secondary | ICD-10-CM | POA: Insufficient documentation

## 2016-03-10 DIAGNOSIS — Z823 Family history of stroke: Secondary | ICD-10-CM | POA: Insufficient documentation

## 2016-03-10 DIAGNOSIS — M1990 Unspecified osteoarthritis, unspecified site: Secondary | ICD-10-CM | POA: Diagnosis not present

## 2016-03-10 DIAGNOSIS — Q211 Atrial septal defect: Secondary | ICD-10-CM | POA: Insufficient documentation

## 2016-03-10 HISTORY — PX: TEE WITHOUT CARDIOVERSION: SHX5443

## 2016-03-10 SURGERY — ECHOCARDIOGRAM, TRANSESOPHAGEAL
Anesthesia: Moderate Sedation

## 2016-03-10 MED ORDER — FENTANYL CITRATE (PF) 100 MCG/2ML IJ SOLN
INTRAMUSCULAR | Status: AC
  Filled 2016-03-10: qty 2

## 2016-03-10 MED ORDER — FENTANYL CITRATE (PF) 100 MCG/2ML IJ SOLN
INTRAMUSCULAR | Status: DC | PRN
  Administered 2016-03-10 (×2): 25 ug via INTRAVENOUS

## 2016-03-10 MED ORDER — BUTAMBEN-TETRACAINE-BENZOCAINE 2-2-14 % EX AERO
INHALATION_SPRAY | CUTANEOUS | Status: DC | PRN
  Administered 2016-03-10: 2 via TOPICAL

## 2016-03-10 MED ORDER — MIDAZOLAM HCL 10 MG/2ML IJ SOLN
INTRAMUSCULAR | Status: DC | PRN
  Administered 2016-03-10: 1 mg via INTRAVENOUS
  Administered 2016-03-10: 2 mg via INTRAVENOUS
  Administered 2016-03-10: 1 mg via INTRAVENOUS

## 2016-03-10 MED ORDER — SODIUM CHLORIDE 0.9 % IV SOLN
INTRAVENOUS | Status: DC
  Administered 2016-03-10: 500 mL via INTRAVENOUS

## 2016-03-10 MED ORDER — MIDAZOLAM HCL 5 MG/ML IJ SOLN
INTRAMUSCULAR | Status: AC
  Filled 2016-03-10: qty 2

## 2016-03-10 NOTE — H&P (View-Only) (Signed)
Electrophysiology Office Note   Date:  03/01/2016   ID:  [name], DOB May 30, 1941, MRN [id number]  PCP:  [name], MD  Cardiologist:  Dr [name] Primary Electrophysiologist: [name], MD    Chief Complaint  Patient presents with  . Atrial Fibrillation     History of Present Illness: [name] is a 75 y.o. male who presents today for electrophysiology evaluation.   He has longstanding persistent afib.  He is minimally symptomatic and reasonably active for his age.  He has had difficulty with anticoagulation.  He reports GI hemorrhage previously with coumadin.  He tried xarelto but developed redness of both legs.  HE then switched briefly to eliquis but noticed BRBPR for which he stopped this medicine.  Currently, he is anticoagulated with ASA only.  He is followed by Dr [name].  He is referred today for consideration of Watchman LAAO procedure.   Today, he denies symptoms of palpitations, chest pain, shortness of breath, orthopnea, PND, lower extremity edema, claudication, dizziness, presyncope, syncope, bleeding, or neurologic sequela. The patient is tolerating medications without difficulties and is otherwise without complaint today.    Past Medical History:  Diagnosis Date  . Aneurysm ([place])   . Chronic atrial fibrillation (HCC)    Low to moderate risk for thromboembolism with mild ASVD and low LAA velocity+ hypertension and moderately advanced age  . Degenerative joint disease   . Hyperlipidemia    06/2011:122, 84, 41, 64  . Hypertension    Admitted for chest pain and 06/2011-neg stress Myoview,mod. LVH with nl EF on echo; TSH of 3.75  . Meniere's disease   . Nephrolithiasis   . 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 Pneumonia   . Renal hemorrhage, right 2009   2009 while anticoagulated with Coumadin; treated with the renal artery embolization   Past Surgical History:  Procedure Laterality Date    . ORIF FEMUR FRACTURE    . PILONIDAL CYST EXCISION       Current Outpatient Prescriptions  Medication Sig Dispense Refill  . acetaminophen (TYLENOL) 500 MG tablet Take 1,000 mg by mouth every 8 (eight) hours as needed (pain).    Marland Kitchen aspirin 81 MG tablet Take 81 mg by mouth daily.    . cetirizine (ZYRTEC) 10 MG tablet Take 10 mg by mouth daily.    Marland Kitchen diltiazem (CARDIZEM CD) 240 MG 24 hr capsule Take 240 mg by mouth daily.    . fluticasone (FLONASE) 50 MCG/ACT nasal spray Place 1 spray into both nostrils every evening.    . meclizine (ANTIVERT) 25 MG tablet Take 25 mg by mouth 2 (two) times daily. For dizziness    . metoprolol succinate (TOPROL-XL) 25 MG 24 hr tablet Take 1 tablet (25 mg total) by mouth daily. 30 tablet 6  . Multiple Vitamin (MULTIVITAMIN) tablet Take 1 tablet by mouth daily.    . pravastatin (PRAVACHOL) 20 MG tablet TAKE ONE TABLET BY MOUTH ONCE EVERY EVENING. 90 tablet 3  . traZODone (DESYREL) 50 MG tablet Take 50 mg by mouth at bedtime.    . triamterene-hydrochlorothiazide (MAXZIDE-25) 37.5-25 MG per tablet Take 1 tablet by mouth daily.     No current facility-administered medications for this visit.     Allergies:   Statins   Social History:  The patient  reports that he quit smoking about 8 years ago. He has a 40.00 pack-year smoking history. He has never used smokeless tobacco.  He reports that he does not drink alcohol or use drugs.   Family History:  The patient's family history includes Atrial fibrillation in his sister; Cancer in his mother; Hyperlipidemia in his mother; Hypertension in his mother; Stroke in his father.    ROS:  Please see the history of present illness.   All other systems are reviewed and negative.    PHYSICAL EXAM: VS:  BP 110/66   Pulse (!) 52   Ht 5\' 9"  (1.753 m)   Wt 208 lb (94.3 kg)   BMI 30.72 kg/m  , BMI Body mass index is 30.72 kg/m. GEN: Well nourished, well developed, in no acute distress  HEENT: normal  Neck: no JVD,  carotid bruits, or masses Cardiac: iRRR; no murmurs, rubs, or gallops,no edema  Respiratory:  clear to auscultation bilaterally, normal work of breathing GI: soft, nontender, nondistended, + BS MS: no deformity or atrophy  Skin: warm and dry  Neuro:  Strength and sensation are intact Psych: euthymic mood, full affect  EKG:  EKG is ordered today. The ekg ordered today shows afib, V rate 52 bpm, nonspecific St/ T changes   Recent Labs: 06/03/2015: Creatinine, Ser 1.10    Lipid Panel     Component Value Date/Time   CHOL 161 12/08/2012 0705   TRIG 165 (H) 12/08/2012 0705   HDL 37 (L) 12/08/2012 0705   CHOLHDL 4.4 12/08/2012 0705   VLDL 33 12/08/2012 0705   LDLCALC 91 12/08/2012 0705     Wt Readings from Last 3 Encounters:  03/01/16 208 lb (94.3 kg)  02/18/16 206 lb (93.4 kg)  12/23/15 205 lb 5 oz (93.1 kg)      Other studies Reviewed: Additional studies/ records that were reviewed today include: Dr Raylene Everts notes, labs, prior echo 2013   ASSESSMENT AND PLAN:  1.  Persistent afib The patient has minimally symptomatic persistent afib.  I have seen [name] is a 75 y.o. male in the office today who has been referred by Dr [name] for a Watchman left atrial appendage closure device.   This patients CHA2DS2-VASc Score and unadjusted Ischemic Stroke Rate (% per year) is equal to 3.2 % stroke rate/year from a score of 3 which necessitates long term oral anticoagulation to prevent stroke.  Unfortunately, He is not felt to be a long term Warfarin candidate secondary to GI bleeding.  The patients chart has been reviewed and I along with their referring cardiologist feel that they would be a candidate for short term oral anticoagulation.  Procedural risks for the Watchman implant have been reviewed with the patient including a 1% risk of stroke, 2% risk of perforation, 0.1% risk of device embolization.  Given the patient's poor candidacy for long-term oral anticoagulation,  ability to tolerate short term oral anticoagulation, I have recommended the watchman left atrial appendage closure system.  TEE will be scheduled to review LAA anatomy.  The patient understands that the ability to implant Watchman is dependent on results of the TEE.  If patient is candidate for Watchman based on TEE results, we will schedule the procedure at the next available time.  He would prefer anticoagulation with eliquis rather than coumadin at time of the procedure.  Continue rate control long term for anticoagulation.  Current medicines are reviewed at length with the patient today.   The patient does not have concerns regarding his medicines.  The following changes were made today:  none  Signed, [name], MD  03/01/2016 8:33 PM  [place] [address] [place] [address] [phone] (office) [phone] (fax)

## 2016-03-10 NOTE — Telephone Encounter (Signed)
Beautiful pictures should be good for Watchman ? 27 mm

## 2016-03-10 NOTE — Interval H&P Note (Signed)
History and Physical Interval Note:  03/10/2016 11:58 AM  [name]  has presented today for surgery, with the diagnosis of AFIB  The various methods of treatment have been discussed with the patient and family. After consideration of risks, benefits and other options for treatment, the patient has consented to  Procedure(s): TRANSESOPHAGEAL ECHOCARDIOGRAM (TEE) (N/A) as a surgical intervention .  The patient's history has been reviewed, patient examined, no change in status, stable for surgery.  I have reviewed the patient's chart and labs.  Questions were answered to the patient's satisfaction.     [name]

## 2016-03-10 NOTE — CV Procedure (Signed)
During this procedure the patient is administered a total of Versed 4 mg and Fentanyl 50 mg to achieve and maintain moderate conscious sedation.  The patient's heart rate, blood pressure, and oxygen saturation are monitored continuously during the procedure. The period of conscious sedation is 36 minutes, of which I was present face-to-face 100% of this time.  Severe LAE Mild MR Mild AR No LAA clot  Measurements pertinent to Watchman made EF 60% PFO by color but bubble negative

## 2016-03-10 NOTE — Discharge Instructions (Signed)

## 2016-03-10 NOTE — Progress Notes (Signed)
*  PRELIMINARY RESULTS* Echocardiogram Echocardiogram Transesophageal has been performed.  Leavy Cella 03/10/2016, 1:18 PM

## 2016-03-11 ENCOUNTER — Encounter (HOSPITAL_COMMUNITY): Payer: Self-pay | Admitting: Cardiovascular Disease

## 2016-03-16 ENCOUNTER — Telehealth: Payer: Self-pay | Admitting: Internal Medicine

## 2016-03-16 NOTE — Telephone Encounter (Signed)
Follow up    Patient calling back to speak with nurse regarding upcoming procedure.  Have some questions.

## 2016-03-16 NOTE — Telephone Encounter (Signed)
New message  Pt call requesting to speak with RN about getting instructions for procedure on 10/19. Please call back to discuss

## 2016-03-16 NOTE — Telephone Encounter (Signed)
Spoke with patient and he says he has not been giving any instructions for 03/18/16 procedure.  Looks like he is scheduled for 10am I have asked he be at the [facility] [place] after midnight Okay to take morning medications with a small sip of water Plan for one night stay

## 2016-03-18 ENCOUNTER — Ambulatory Visit (HOSPITAL_COMMUNITY): Payer: PPO | Admitting: Certified Registered Nurse Anesthetist

## 2016-03-18 ENCOUNTER — Encounter (HOSPITAL_COMMUNITY)
Admission: AD | Disposition: A | Discharge status: Home / Self Care | Payer: Self-pay | Source: Ambulatory Visit | Attending: Internal Medicine

## 2016-03-18 ENCOUNTER — Encounter (HOSPITAL_COMMUNITY): Payer: Self-pay | Admitting: Internal Medicine

## 2016-03-18 ENCOUNTER — Ambulatory Visit (HOSPITAL_COMMUNITY): Payer: PPO

## 2016-03-18 ENCOUNTER — Inpatient Hospital Stay (HOSPITAL_COMMUNITY)
Admission: AD | Admit: 2016-03-18 | Discharge: 2016-03-19 | DRG: 274 | Disposition: A | Discharge status: Home / Self Care | Payer: PPO | Source: Ambulatory Visit | Attending: Internal Medicine | Admitting: Internal Medicine

## 2016-03-18 DIAGNOSIS — Z7901 Long term (current) use of anticoagulants: Secondary | ICD-10-CM

## 2016-03-18 DIAGNOSIS — I739 Peripheral vascular disease, unspecified: Secondary | ICD-10-CM | POA: Diagnosis not present

## 2016-03-18 DIAGNOSIS — I714 Abdominal aortic aneurysm, without rupture: Secondary | ICD-10-CM | POA: Diagnosis present

## 2016-03-18 DIAGNOSIS — I4891 Unspecified atrial fibrillation: Secondary | ICD-10-CM | POA: Diagnosis not present

## 2016-03-18 DIAGNOSIS — I482 Chronic atrial fibrillation, unspecified: Secondary | ICD-10-CM | POA: Diagnosis present

## 2016-03-18 DIAGNOSIS — Z87891 Personal history of nicotine dependence: Secondary | ICD-10-CM

## 2016-03-18 DIAGNOSIS — I4819 Other persistent atrial fibrillation: Secondary | ICD-10-CM | POA: Diagnosis present

## 2016-03-18 DIAGNOSIS — Z8249 Family history of ischemic heart disease and other diseases of the circulatory system: Secondary | ICD-10-CM | POA: Diagnosis not present

## 2016-03-18 DIAGNOSIS — Z809 Family history of malignant neoplasm, unspecified: Secondary | ICD-10-CM | POA: Diagnosis not present

## 2016-03-18 DIAGNOSIS — Z823 Family history of stroke: Secondary | ICD-10-CM | POA: Diagnosis not present

## 2016-03-18 DIAGNOSIS — H8109 Meniere's disease, unspecified ear: Secondary | ICD-10-CM | POA: Diagnosis not present

## 2016-03-18 DIAGNOSIS — Z79899 Other long term (current) drug therapy: Secondary | ICD-10-CM | POA: Diagnosis not present

## 2016-03-18 DIAGNOSIS — Z7982 Long term (current) use of aspirin: Secondary | ICD-10-CM

## 2016-03-18 DIAGNOSIS — I481 Persistent atrial fibrillation: Secondary | ICD-10-CM | POA: Diagnosis not present

## 2016-03-18 DIAGNOSIS — M1990 Unspecified osteoarthritis, unspecified site: Secondary | ICD-10-CM | POA: Diagnosis not present

## 2016-03-18 DIAGNOSIS — Z006 Encounter for examination for normal comparison and control in clinical research program: Secondary | ICD-10-CM | POA: Diagnosis not present

## 2016-03-18 DIAGNOSIS — I10 Essential (primary) hypertension: Secondary | ICD-10-CM | POA: Diagnosis not present

## 2016-03-18 DIAGNOSIS — E785 Hyperlipidemia, unspecified: Secondary | ICD-10-CM | POA: Diagnosis not present

## 2016-03-18 HISTORY — DX: Personal history of other medical treatment: Z92.89

## 2016-03-18 HISTORY — PX: LEFT ATRIAL APPENDAGE OCCLUSION: SHX173A

## 2016-03-18 LAB — CBC
HCT: 43.6 % (ref 39.0–52.0)
Hemoglobin: 15.1 g/dL (ref 13.0–17.0)
MCH: 30.9 pg (ref 26.0–34.0)
MCHC: 34.6 g/dL (ref 30.0–36.0)
MCV: 89.2 fL (ref 78.0–100.0)
Platelets: 176 10*3/uL (ref 150–400)
RBC: 4.89 MIL/uL (ref 4.22–5.81)
RDW: 13.2 % (ref 11.5–15.5)
WBC: 6.2 10*3/uL (ref 4.0–10.5)

## 2016-03-18 LAB — PROTIME-INR
INR: 1.05
Prothrombin Time: 13.7 seconds (ref 11.4–15.2)

## 2016-03-18 LAB — BASIC METABOLIC PANEL
Anion gap: 8 (ref 5–15)
BUN: 18 mg/dL (ref 6–20)
CO2: 28 mmol/L (ref 22–32)
Calcium: 9.6 mg/dL (ref 8.9–10.3)
Chloride: 103 mmol/L (ref 101–111)
Creatinine, Ser: 1.2 mg/dL (ref 0.61–1.24)
GFR calc Af Amer: >60 mL/min (ref >60)
GFR calc non Af Amer: 59 mL/min — ABNORMAL LOW (ref >60)
Glucose, Bld: 119 mg/dL — ABNORMAL HIGH (ref 65–99)
Potassium: 3.9 mmol/L (ref 3.5–5.1)
Sodium: 139 mmol/L (ref 135–145)

## 2016-03-18 LAB — GLUCOSE, CAPILLARY: Glucose-Capillary: 115 mg/dL — ABNORMAL HIGH (ref 65–99)

## 2016-03-18 LAB — POCT ACTIVATED CLOTTING TIME
Activated Clotting Time: 279 seconds
Activated Clotting Time: 175 seconds
Activated Clotting Time: 180 seconds

## 2016-03-18 LAB — TYPE AND SCREEN
ABO/RH(D): A POS
Antibody Screen: NEGATIVE

## 2016-03-18 LAB — ABO/RH: ABO/RH(D): A POS

## 2016-03-18 SURGERY — LEFT ATRIAL APPENDAGE OCCLUSION
Anesthesia: General

## 2016-03-18 MED ORDER — DEXAMETHASONE SODIUM PHOSPHATE 4 MG/ML IJ SOLN
INTRAMUSCULAR | Status: DC | PRN
  Administered 2016-03-18: 5 mg via INTRAVENOUS

## 2016-03-18 MED ORDER — SODIUM CHLORIDE 0.9% FLUSH: 3.0000 mL | INTRAVENOUS | Status: DC | PRN

## 2016-03-18 MED ORDER — EPHEDRINE SULFATE 50 MG/ML IJ SOLN
INTRAMUSCULAR | Status: DC | PRN
  Administered 2016-03-18 (×6): 5 mg via INTRAVENOUS

## 2016-03-18 MED ORDER — PROPOFOL 10 MG/ML IV BOLUS
INTRAVENOUS | Status: DC | PRN
  Administered 2016-03-18: 100 mg via INTRAVENOUS

## 2016-03-18 MED ORDER — LIDOCAINE HCL (PF) 1 % IJ SOLN
INTRAMUSCULAR | Status: AC
  Filled 2016-03-18: qty 30

## 2016-03-18 MED ORDER — SODIUM CHLORIDE 0.9 % IV SOLN: 250.0000 mL | INTRAVENOUS | Status: DC | PRN

## 2016-03-18 MED ORDER — MIDAZOLAM HCL 5 MG/5ML IJ SOLN
INTRAMUSCULAR | Status: DC | PRN
  Administered 2016-03-18: 2 mg via INTRAVENOUS

## 2016-03-18 MED ORDER — HEPARIN (PORCINE) IN NACL 2-0.9 UNIT/ML-% IJ SOLN
INTRAMUSCULAR | Status: AC
  Filled 2016-03-18: qty 500

## 2016-03-18 MED ORDER — IOPAMIDOL (ISOVUE-370) INJECTION 76%
INTRAVENOUS | Status: AC
  Filled 2016-03-18: qty 100

## 2016-03-18 MED ORDER — SODIUM CHLORIDE 0.9% FLUSH
3.0000 mL | Freq: Two times a day (BID) | INTRAVENOUS | Status: DC
  Administered 2016-03-18 (×2): 3 mL via INTRAVENOUS

## 2016-03-18 MED ORDER — IOHEXOL 350 MG/ML SOLN
INTRAVENOUS | Status: DC | PRN
  Administered 2016-03-18: 50 mL via INTRAVENOUS

## 2016-03-18 MED ORDER — ONDANSETRON HCL 4 MG/2ML IJ SOLN: 4.0000 mg | Freq: Four times a day (QID) | INTRAMUSCULAR | Status: DC | PRN

## 2016-03-18 MED ORDER — HEPARIN (PORCINE) IN NACL 2-0.9 UNIT/ML-% IJ SOLN
INTRAMUSCULAR | Status: DC | PRN
  Administered 2016-03-18: 500 mL

## 2016-03-18 MED ORDER — HEPARIN SODIUM (PORCINE) 1000 UNIT/ML IJ SOLN
INTRAMUSCULAR | Status: AC
  Filled 2016-03-18: qty 1

## 2016-03-18 MED ORDER — ACETAMINOPHEN 325 MG PO TABS: 650.0000 mg | ORAL_TABLET | ORAL | Status: DC | PRN

## 2016-03-18 MED ORDER — HEPARIN SODIUM (PORCINE) 1000 UNIT/ML IJ SOLN
INTRAMUSCULAR | Status: DC | PRN
  Administered 2016-03-18: 12 mL via INTRAVENOUS

## 2016-03-18 MED ORDER — ROCURONIUM BROMIDE 100 MG/10ML IV SOLN
INTRAVENOUS | Status: DC | PRN
  Administered 2016-03-18: 50 mg via INTRAVENOUS

## 2016-03-18 MED ORDER — OFF THE BEAT BOOK
Freq: Once | Status: AC
  Administered 2016-03-18: 22:00:00
  Filled 2016-03-18: qty 1

## 2016-03-18 MED ORDER — GLYCOPYRROLATE 0.2 MG/ML IJ SOLN
INTRAMUSCULAR | Status: DC | PRN
  Administered 2016-03-18: 0.2 mg via INTRAVENOUS

## 2016-03-18 MED ORDER — FENTANYL CITRATE (PF) 100 MCG/2ML IJ SOLN
INTRAMUSCULAR | Status: DC | PRN
  Administered 2016-03-18: 100 ug via INTRAVENOUS

## 2016-03-18 MED ORDER — LIDOCAINE HCL (PF) 1 % IJ SOLN
INTRAMUSCULAR | Status: DC | PRN
  Administered 2016-03-18: 10 mL via INTRADERMAL

## 2016-03-18 MED ORDER — SUGAMMADEX SODIUM 200 MG/2ML IV SOLN
INTRAVENOUS | Status: DC | PRN
  Administered 2016-03-18: 400 mg via INTRAVENOUS

## 2016-03-18 MED ORDER — ONDANSETRON HCL 4 MG/2ML IJ SOLN
INTRAMUSCULAR | Status: DC | PRN
  Administered 2016-03-18: 4 mg via INTRAVENOUS

## 2016-03-18 MED ORDER — ASPIRIN EC 81 MG PO TBEC
81.0000 mg | DELAYED_RELEASE_TABLET | Freq: Every day | ORAL | Status: DC
Start: 2016-03-18 — End: 2016-03-19
  Administered 2016-03-19: 81 mg via ORAL
  Filled 2016-03-18 (×2): qty 1

## 2016-03-18 MED ORDER — PHENYLEPHRINE HCL 10 MG/ML IJ SOLN
INTRAMUSCULAR | Status: DC | PRN
  Administered 2016-03-18: 80 ug via INTRAVENOUS

## 2016-03-18 MED ORDER — LIDOCAINE HCL (CARDIAC) 20 MG/ML IV SOLN
INTRAVENOUS | Status: DC | PRN
  Administered 2016-03-18: 100 mg via INTRAVENOUS

## 2016-03-18 MED ORDER — DILTIAZEM HCL ER COATED BEADS 240 MG PO CP24: 240.0000 mg | ORAL_CAPSULE | Freq: Every day | ORAL | Status: DC

## 2016-03-18 MED ORDER — TRIAMTERENE-HCTZ 37.5-25 MG PO TABS
1.0000 | ORAL_TABLET | Freq: Every day | ORAL | Status: DC
  Administered 2016-03-19: 10:00:00 1 via ORAL
  Filled 2016-03-18: qty 1

## 2016-03-18 MED ORDER — APIXABAN 5 MG PO TABS
5.0000 mg | ORAL_TABLET | Freq: Two times a day (BID) | ORAL | Status: DC
  Administered 2016-03-18 – 2016-03-19 (×3): 5 mg via ORAL
  Filled 2016-03-18 (×3): qty 1

## 2016-03-18 MED ORDER — TRAZODONE HCL 50 MG PO TABS
50.0000 mg | ORAL_TABLET | Freq: Every day | ORAL | Status: DC
  Administered 2016-03-18: 50 mg via ORAL
  Filled 2016-03-18: qty 1

## 2016-03-18 MED ORDER — CEFAZOLIN SODIUM-DEXTROSE 2-4 GM/100ML-% IV SOLN
2.0000 g | INTRAVENOUS | Status: AC
  Administered 2016-03-18: 2 g via INTRAVENOUS
  Filled 2016-03-18: qty 100

## 2016-03-18 MED ORDER — SODIUM CHLORIDE 0.45 % IV SOLN
INTRAVENOUS | Status: DC
  Administered 2016-03-18 (×2): via INTRAVENOUS

## 2016-03-18 MED ORDER — PROTAMINE SULFATE 10 MG/ML IV SOLN
INTRAVENOUS | Status: DC | PRN
  Administered 2016-03-18: 30 mg via INTRAVENOUS

## 2016-03-18 SURGICAL SUPPLY — 16 items
BLANKET WARM UNDERBOD FULL ACC (MISCELLANEOUS) ×2 IMPLANT
CATH DIAG 6FR PIGTAIL (CATHETERS) ×1 IMPLANT
KIT HEART LEFT (KITS) ×1 IMPLANT
NDL BAYLIS TRANSSEPTAL 71CM (NEEDLE) IMPLANT
NEEDLE BAYLIS TRANSSEPTAL 71CM (NEEDLE) ×2 IMPLANT
PACK CARDIAC CATHETERIZATION (CUSTOM PROCEDURE TRAY) ×1 IMPLANT
PAD DEFIB LIFELINK (PAD) ×2 IMPLANT
SHEATH BAYLIS TORFLEX (SHEATH) ×1 IMPLANT
SHEATH INTRO CHECKFLO 16F 13 (SHEATH) IMPLANT
SHEATH INTRO CHECKFLO 16F 13CM (SHEATH) ×1
SHEATH PINNACLE 8F 10CM (SHEATH) ×1 IMPLANT
TRANSDUCER W/STOPCOCK (MISCELLANEOUS) ×1 IMPLANT
TUBING CIL FLEX 10 FLL-RA (TUBING) ×1 IMPLANT
WATCHMAN ACCESS DOUBLE CURVE (SHEATH) ×1 IMPLANT
WATCHMAN CLOSURE 30MM (Prosthesis & Implant Heart) ×1 IMPLANT
WIRE AMPLATZ WHISKJ .035X260CM (WIRE) ×1 IMPLANT

## 2016-03-18 NOTE — Transfer of Care (Signed)
Immediate Anesthesia Transfer of Care Note  Patient: [name]  Procedure(s) Performed: Procedure(s): LEFT ATRIAL APPENDAGE OCCLUSION (N/A)  Patient Location: Cath Lab  Anesthesia Type:General  Level of Consciousness: awake, alert  and oriented  Airway & Oxygen Therapy: Patient Spontanous Breathing and Patient connected to nasal cannula oxygen  Post-op Assessment: Report given to RN, Post -op Vital signs reviewed and stable and Patient moving all extremities X 4  Post vital signs: Reviewed and stable  Last Vitals:  Vitals:   03/18/16 0739  BP: 123/89  Pulse: (!) 45  Resp: 16  Temp: 36.4 C    Last Pain:  Vitals:   03/18/16 0739  TempSrc: Oral         Complications: No apparent anesthesia complications

## 2016-03-18 NOTE — Anesthesia Preprocedure Evaluation (Signed)
Anesthesia Evaluation  Patient identified by MRN, date of birth, ID band Patient awake    Reviewed: Allergy & Precautions, NPO status , Patient's Chart, lab work & pertinent test results  Airway Mallampati: I  TM Distance: >3 FB Neck ROM: Full    Dental   Pulmonary former smoker,    Pulmonary exam normal        Cardiovascular hypertension, Pt. on medications Normal cardiovascular exam     Neuro/Psych    GI/Hepatic   Endo/Other    Renal/GU      Musculoskeletal   Abdominal   Peds  Hematology   Anesthesia Other Findings   Reproductive/Obstetrics                             Anesthesia Physical Anesthesia Plan  ASA: III  Anesthesia Plan: General   Post-op Pain Management:    Induction: Intravenous  Airway Management Planned: Oral ETT  Additional Equipment: Arterial line  Intra-op Plan:   Post-operative Plan: Extubation in OR  Informed Consent: I have reviewed the patients History and Physical, chart, labs and discussed the procedure including the risks, benefits and alternatives for the proposed anesthesia with the patient or authorized representative who has indicated his/her understanding and acceptance.     Plan Discussed with: CRNA and Surgeon  Anesthesia Plan Comments:         Anesthesia Quick Evaluation

## 2016-03-18 NOTE — Progress Notes (Signed)
  Echocardiogram Echocardiogram Transesophageal has been performed.  [name] 03/18/2016, 12:29 PM

## 2016-03-18 NOTE — Anesthesia Postprocedure Evaluation (Signed)
Anesthesia Post Note  Patient: CHADD WILLADSEN  Procedure(s) Performed: Procedure(s) (LRB): LEFT ATRIAL APPENDAGE OCCLUSION (N/A)  Patient location during evaluation: PACU Anesthesia Type: General Level of consciousness: awake and alert Pain management: pain level controlled Vital Signs Assessment: post-procedure vital signs reviewed and stable Respiratory status: spontaneous breathing, nonlabored ventilation, respiratory function stable and patient connected to nasal cannula oxygen Cardiovascular status: blood pressure returned to baseline and stable Postop Assessment: no signs of nausea or vomiting Anesthetic complications: no    Last Vitals:  Vitals:   03/18/16 1330 03/18/16 1335  BP: 137/74 131/70  Pulse: (!) 56 (!) 53  Resp: 15 15  Temp:      Last Pain:  Vitals:   03/18/16 0739  TempSrc: Oral                 Ronith Berti DAVID

## 2016-03-18 NOTE — Interval H&P Note (Signed)
History and Physical Interval Note:  03/18/2016 9:52 AM  [name]  has presented today for surgery, with the diagnosis of afib  The various methods of treatment have been discussed with the patient and family. After consideration of risks, benefits and other options for treatment, the patient has consented to  Procedure(s): LEFT ATRIAL APPENDAGE OCCLUSION (N/A) as a surgical intervention .  The patient's history has been reviewed, patient examined, no change in status, stable for surgery.  I have reviewed the patient's chart and labs.  Questions were answered to the patient's satisfaction.    Risks of procedure again discussed at length with patient who wishes to proceed. Start eliquis this evening.  Continue on ASA.  [name]

## 2016-03-18 NOTE — Discharge Instructions (Addendum)

## 2016-03-18 NOTE — Progress Notes (Addendum)
Site area: RFV Site Prior to Removal:  Level 0 Pressure Applied For:30 min Manual:  yes  Patient Status During Pull: stable  Post Pull Site:  Level 0 Post Pull Instructions Given:  yes Post Pull Pulses Present:  Dressing Applied:tegaderm   Bedrest begins @ [id number] till 1940 Comments: Right radial arterial line removed. Pressure to site x 36min. Site level zero. Dressing applied.

## 2016-03-18 NOTE — H&P (View-Only) (Signed)
Electrophysiology Office Note   Date:  03/01/2016   ID:  [name], DOB [birth year], MRN [id number]  PCP:  [name], MD  Cardiologist:  Dr [name] Primary Electrophysiologist: [name], MD    Chief Complaint  Patient presents with  . Atrial Fibrillation     History of Present Illness: [name] is a 75 y.o. male who presents today for electrophysiology evaluation.   He has longstanding persistent afib.  He is minimally symptomatic and reasonably active for his age.  He has had difficulty with anticoagulation.  He reports GI hemorrhage previously with coumadin.  He tried xarelto but developed redness of both legs.  HE then switched briefly to eliquis but noticed BRBPR for which he stopped this medicine.  Currently, he is anticoagulated with ASA only.  He is followed by Dr [name].  He is referred today for consideration of Watchman LAAO procedure.   Today, he denies symptoms of palpitations, chest pain, shortness of breath, orthopnea, PND, lower extremity edema, claudication, dizziness, presyncope, syncope, bleeding, or neurologic sequela. The patient is tolerating medications without difficulties and is otherwise without complaint today.    Past Medical History:  Diagnosis Date  . Aneurysm ([place])   . Chronic atrial fibrillation (HCC)    Low to moderate risk for thromboembolism with mild ASVD and low LAA velocity+ hypertension and moderately advanced age  . Degenerative joint disease   . Hyperlipidemia    06/2011:122, 84, 41, 64  . Hypertension    Admitted for chest pain and 06/2011-neg stress Myoview,mod. LVH with nl EF on echo; TSH of 3.75  . Meniere's disease   . Nephrolithiasis   . 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 Pneumonia   . Renal hemorrhage, right 2009   2009 while anticoagulated with Coumadin; treated with the renal artery embolization   Past Surgical History:  Procedure Laterality Date    . ORIF FEMUR FRACTURE    . PILONIDAL CYST EXCISION       Current Outpatient Prescriptions  Medication Sig Dispense Refill  . acetaminophen (TYLENOL) 500 MG tablet Take 1,000 mg by mouth every 8 (eight) hours as needed (pain).    Marland Kitchen aspirin 81 MG tablet Take 81 mg by mouth daily.    . cetirizine (ZYRTEC) 10 MG tablet Take 10 mg by mouth daily.    Marland Kitchen diltiazem (CARDIZEM CD) 240 MG 24 hr capsule Take 240 mg by mouth daily.    . fluticasone (FLONASE) 50 MCG/ACT nasal spray Place 1 spray into both nostrils every evening.    . meclizine (ANTIVERT) 25 MG tablet Take 25 mg by mouth 2 (two) times daily. For dizziness    . metoprolol succinate (TOPROL-XL) 25 MG 24 hr tablet Take 1 tablet (25 mg total) by mouth daily. 30 tablet 6  . Multiple Vitamin (MULTIVITAMIN) tablet Take 1 tablet by mouth daily.    . pravastatin (PRAVACHOL) 20 MG tablet TAKE ONE TABLET BY MOUTH ONCE EVERY EVENING. 90 tablet 3  . traZODone (DESYREL) 50 MG tablet Take 50 mg by mouth at bedtime.    . triamterene-hydrochlorothiazide (MAXZIDE-25) 37.5-25 MG per tablet Take 1 tablet by mouth daily.     No current facility-administered medications for this visit.     Allergies:   Statins   Social History:  The patient  reports that he quit smoking about 8 years ago. He has a 40.00 pack-year smoking history. He has never used smokeless tobacco.  He reports that he does not drink alcohol or use drugs.   Family History:  The patient's family history includes Atrial fibrillation in his sister; Cancer in his mother; Hyperlipidemia in his mother; Hypertension in his mother; Stroke in his father.    ROS:  Please see the history of present illness.   All other systems are reviewed and negative.    PHYSICAL EXAM: VS:  BP 110/66   Pulse (!) 52   Ht 5\' 9"  (1.753 m)   Wt 208 lb (94.3 kg)   BMI 30.72 kg/m  , BMI Body mass index is 30.72 kg/m. GEN: Well nourished, well developed, in no acute distress  HEENT: normal  Neck: no JVD,  carotid bruits, or masses Cardiac: iRRR; no murmurs, rubs, or gallops,no edema  Respiratory:  clear to auscultation bilaterally, normal work of breathing GI: soft, nontender, nondistended, + BS MS: no deformity or atrophy  Skin: warm and dry  Neuro:  Strength and sensation are intact Psych: euthymic mood, full affect  EKG:  EKG is ordered today. The ekg ordered today shows afib, V rate 52 bpm, nonspecific St/ T changes   Recent Labs: 06/03/2015: Creatinine, Ser 1.10    Lipid Panel     Component Value Date/Time   CHOL 161 12/08/2012 0705   TRIG 165 (H) 12/08/2012 0705   HDL 37 (L) 12/08/2012 0705   CHOLHDL 4.4 12/08/2012 0705   VLDL 33 12/08/2012 0705   LDLCALC 91 12/08/2012 0705     Wt Readings from Last 3 Encounters:  03/01/16 208 lb (94.3 kg)  02/18/16 206 lb (93.4 kg)  12/23/15 205 lb 5 oz (93.1 kg)      Other studies Reviewed: Additional studies/ records that were reviewed today include: Dr Raylene Everts notes, labs, prior echo 2013   ASSESSMENT AND PLAN:  1.  Persistent afib The patient has minimally symptomatic persistent afib.  I have seen [name] is a 75 y.o. male in the office today who has been referred by Dr [name] for a Watchman left atrial appendage closure device.   This patients CHA2DS2-VASc Score and unadjusted Ischemic Stroke Rate (% per year) is equal to 3.2 % stroke rate/year from a score of 3 which necessitates long term oral anticoagulation to prevent stroke.  Unfortunately, He is not felt to be a long term Warfarin candidate secondary to GI bleeding.  The patients chart has been reviewed and I along with their referring cardiologist feel that they would be a candidate for short term oral anticoagulation.  Procedural risks for the Watchman implant have been reviewed with the patient including a 1% risk of stroke, 2% risk of perforation, 0.1% risk of device embolization.  Given the patient's poor candidacy for long-term oral anticoagulation,  ability to tolerate short term oral anticoagulation, I have recommended the watchman left atrial appendage closure system.  TEE will be scheduled to review LAA anatomy.  The patient understands that the ability to implant Watchman is dependent on results of the TEE.  If patient is candidate for Watchman based on TEE results, we will schedule the procedure at the next available time.  He would prefer anticoagulation with eliquis rather than coumadin at time of the procedure.  Continue rate control long term for anticoagulation.  Current medicines are reviewed at length with the patient today.   The patient does not have concerns regarding his medicines.  The following changes were made today:  none  Signed, [name], MD  03/01/2016 8:33 PM  [place] [place] [address] [phone] (office) [phone] (fax)

## 2016-03-18 NOTE — Discharge Summary (Signed)
ELECTROPHYSIOLOGY PROCEDURE DISCHARGE SUMMARY    Patient ID: [name],  MRN: [id number], DOB/AGE: [birth year] 75 y.o.  Admit date: 03/18/2016 Discharge date: 03/19/2016  Primary Care Physician: [name], MD Primary Cardiologist: [name] Electrophysiologist: [name], MD  Primary Discharge Diagnosis:  Longstanding persistent atrial fibrillation status post LAA occluder insertion this admission  Secondary Discharge Diagnosis:  1.  Prior GI bleed 2.  PVD 3.  Hypertension 4.  DJD  Procedures This Admission:  1.  Insertion of left atrial appendage occluder (Watchman) on 03/18/16 by Dr Rayann Heman and Dr Burt Knack.  This study demonstrated successful implantation of Watchman LAA occluder with no early apparent complications. TEE at time of procedure demonstrated no leak around device.   Brief HPI: [name] is a 75 y.o. male with a history of longstanding persistent atrial fibrillation.  They are felt to not be a candidate for long term Warfarin due to prior GI bleeding. Risks, benefits, and alternatives to Watchman implant were reviewed with the patient who wished to proceed.  The patient underwent TEE prior to the procedure which demonstrated appendage suitable for attempt at [facility] placement.    Hospital Course:  The patient was admitted and underwent Watchman insertion with details as outlined above.  They were monitored on telemetry overnight which demonstrated atrial fibrillation with controlled ventricular response.  Groin was without complication on the day of discharge.  The patient was examined and considered to be stable for discharge.  Bedside echo demonstrated stable device position without pericardial effusion.  Wound care and restrictions were reviewed with the patient.   This patients CHA2DS2-VASc Score and unadjusted Ischemic Stroke Rate (% per year) is equal to 3.  The patient will be started on Eliquis at discharge.  The patient will be seen in 1 week for  groin check, monitor for s/s of bleeding, CBC.  45 day TEE will also be scheduled.   Physical Exam: Vitals:   03/18/16 1931 03/18/16 2030 03/18/16 2230 03/19/16 0424  BP: 106/70 113/82 103/75 122/72  Pulse: 84 79 84 82  Resp: (!) 22 17 19 15   Temp: 97.7 F (36.5 C)   98 F (36.7 C)  TempSrc: Oral   Oral  SpO2: 97% 95% 96% 95%  Weight:    192 lb 14.4 oz (87.5 kg)  Height:        GEN- The patient is well appearing, alert and oriented x 3 today.   HEENT: normocephalic, atraumatic; sclera clear, conjunctiva pink; hearing intact; oropharynx clear; neck supple Lungs- Clear to ausculation bilaterally, normal work of breathing.  No wheezes, rales, rhonchi Heart- irreg irreg,  no murmurs, rubs or gallops GI- soft, non-tender, non-distended, bowel sounds present Extremities- no clubbing, cyanosis, or edema; DP/PT/radial pulses 2+ bilaterally, groin without hematoma/bruit MS- no significant deformity or atrophy Skin- warm and dry, no rash or lesion Psych- euthymic mood, full affect Neuro- strength and sensation are intact   Labs:   Lab Results  Component Value Date   WBC 6.2 03/18/2016   HGB 15.1 03/18/2016   HCT 43.6 03/18/2016   MCV 89.2 03/18/2016   PLT 176 03/18/2016     Recent Labs Lab 03/19/16 0543  NA 137  K 4.1  CL 102  CO2 27  BUN 17  CREATININE 1.12  CALCIUM 8.9  GLUCOSE 139*     Discharge Medications:    Medication List    TAKE these medications   acetaminophen 500 MG tablet Commonly known as:  TYLENOL Take 1,000 mg  by mouth every 8 (eight) hours as needed (pain).   apixaban 5 MG Tabs tablet Commonly known as:  ELIQUIS Take 1 tablet (5 mg total) by mouth 2 (two) times daily.   aspirin 81 MG tablet Take 81 mg by mouth daily.   cetirizine 10 MG tablet Commonly known as:  ZYRTEC Take 10 mg by mouth at bedtime.   diltiazem 240 MG 24 hr capsule Commonly known as:  CARDIZEM CD Take 240 mg by mouth at bedtime.   fluticasone 50 MCG/ACT nasal  spray Commonly known as:  FLONASE Place 2 sprays into both nostrils every evening.   meclizine 25 MG tablet Commonly known as:  ANTIVERT Take 25 mg by mouth 2 (two) times daily as needed. For dizziness   metoprolol succinate 25 MG 24 hr tablet Commonly known as:  TOPROL-XL Take 1 tablet (25 mg total) by mouth daily.   multivitamin tablet Take 1 tablet by mouth daily.   pravastatin 20 MG tablet Commonly known as:  PRAVACHOL TAKE ONE TABLET BY MOUTH ONCE EVERY EVENING.   traZODone 50 MG tablet Commonly known as:  DESYREL Take 50 mg by mouth at bedtime.   triamterene-hydrochlorothiazide 37.5-25 MG tablet Commonly known as:  MAXZIDE-25 Take 1 tablet by mouth daily.   vitamin C 1000 MG tablet Take 1,000 mg by mouth daily.   Vitamin D (Cholecalciferol) 1000 units Tabs Take 1,000 Units by mouth at bedtime.       Disposition:   Follow-up Information    [name], MD Follow up on 03/25/2016.   Specialty:  Cardiology Why:  at 12:30PM Contact information: [place] [address] [phone]           Duration of Discharge Encounter: Greater than 30 minutes including physician time.  Mable Fill PA-C  [facility]   03/19/2016 8:33 AM  Patient seen, examined. Available data reviewed. Agree with findings, assessment, and plan as outlined by Angelena Form, PA-C. Exam reveals irregularly irregular rhythm, no murmur, lungs CTA, right groin site clear, no edema. Plan as outlined above with Eliquis for anticoagulation until 45 day TEE. Restrictions reviewed with patient and wife.  Sherren Mocha, M.D. 03/19/2016 10:01 AM

## 2016-03-18 NOTE — Anesthesia Procedure Notes (Signed)
Procedure Name: Intubation Date/Time: 03/18/2016 10:42 AM Performed by: Ollen Bowl Pre-anesthesia Checklist: Patient identified, Emergency Drugs available, Suction available, Patient being monitored and Timeout performed Patient Re-evaluated:Patient Re-evaluated prior to inductionOxygen Delivery Method: Circle system utilized and Simple face mask Preoxygenation: Pre-oxygenation with 100% oxygen Intubation Type: IV induction Ventilation: Mask ventilation without difficulty Laryngoscope Size: Miller and 3 Grade View: Grade I Tube type: Oral Tube size: 7.5 mm Number of attempts: 1 Airway Equipment and Method: Patient positioned with wedge pillow and Stylet Placement Confirmation: ETT inserted through vocal cords under direct vision,  positive ETCO2 and breath sounds checked- equal and bilateral Secured at: 23 cm Tube secured with: Tape Dental Injury: Teeth and Oropharynx as per pre-operative assessment

## 2016-03-18 NOTE — CV Procedure (Signed)
TEE was performed intraprocedurally to assist the the placement of a Watchman LAA occluder device. This involves 2D, 3D and color doppler images, and quantitative measurements. Please see the full TEE report for details.  Pixie Casino, MD, [facility] Attending Cardiologist [place]

## 2016-03-19 DIAGNOSIS — I10 Essential (primary) hypertension: Secondary | ICD-10-CM | POA: Diagnosis not present

## 2016-03-19 DIAGNOSIS — I714 Abdominal aortic aneurysm, without rupture: Secondary | ICD-10-CM | POA: Diagnosis not present

## 2016-03-19 DIAGNOSIS — Z7901 Long term (current) use of anticoagulants: Secondary | ICD-10-CM | POA: Diagnosis not present

## 2016-03-19 DIAGNOSIS — I481 Persistent atrial fibrillation: Principal | ICD-10-CM

## 2016-03-19 DIAGNOSIS — Z79899 Other long term (current) drug therapy: Secondary | ICD-10-CM | POA: Diagnosis not present

## 2016-03-19 DIAGNOSIS — M1990 Unspecified osteoarthritis, unspecified site: Secondary | ICD-10-CM | POA: Diagnosis not present

## 2016-03-19 DIAGNOSIS — H8109 Meniere's disease, unspecified ear: Secondary | ICD-10-CM | POA: Diagnosis not present

## 2016-03-19 DIAGNOSIS — I739 Peripheral vascular disease, unspecified: Secondary | ICD-10-CM | POA: Diagnosis not present

## 2016-03-19 DIAGNOSIS — Z7982 Long term (current) use of aspirin: Secondary | ICD-10-CM | POA: Diagnosis not present

## 2016-03-19 DIAGNOSIS — E785 Hyperlipidemia, unspecified: Secondary | ICD-10-CM | POA: Diagnosis not present

## 2016-03-19 DIAGNOSIS — I482 Chronic atrial fibrillation: Secondary | ICD-10-CM | POA: Diagnosis not present

## 2016-03-19 DIAGNOSIS — Z87891 Personal history of nicotine dependence: Secondary | ICD-10-CM | POA: Diagnosis not present

## 2016-03-19 LAB — BASIC METABOLIC PANEL
Anion gap: 8 (ref 5–15)
BUN: 17 mg/dL (ref 6–20)
CO2: 27 mmol/L (ref 22–32)
Calcium: 8.9 mg/dL (ref 8.9–10.3)
Chloride: 102 mmol/L (ref 101–111)
Creatinine, Ser: 1.12 mg/dL (ref 0.61–1.24)
GFR calc Af Amer: >60 mL/min (ref >60)
GFR calc non Af Amer: >60 mL/min (ref >60)
Glucose, Bld: 139 mg/dL — ABNORMAL HIGH (ref 65–99)
Potassium: 4.1 mmol/L (ref 3.5–5.1)
Sodium: 137 mmol/L (ref 135–145)

## 2016-03-19 MED ORDER — APIXABAN 5 MG PO TABS: 5.0000 mg | ORAL_TABLET | Freq: Two times a day (BID) | ORAL | 6 refills | Status: DC

## 2016-03-19 NOTE — Care Management Note (Signed)
Case Management Note  Patient Details  Name: [name] MRN: [id number] Date of Birth: [birth year]  Subjective/Objective:  Afib, left atrial appendage occluder on 03/18/2016                   Action/Plan: Discharge Planning: AVS reviewed Chart reviewed. Restarted on Eliquis, Pt had a copay of $40 with medication. Faxed Eliquis 30 day free trial card to [facility]. # [phone]. Spoke to pharmacist and they do have medication in stock. Pt has used voucher in July 2016, unable to use again.   PCP Esaw Dace MD  Expected Discharge Date:  03/19/2016                Expected Discharge Plan:  Home/Self Care  In-House Referral:  NA  Discharge planning Services  CM Consult, Medication Assistance  Post Acute Care Choice:  NA Choice offered to:  NA  DME Arranged:  N/A DME Agency:  NA  HH Arranged:  NA HH Agency:  NA  Status of Service:  Completed, signed off  If discussed at [place] of Stay Meetings, dates discussed:    Additional Comments:  Erenest Rasher, RN 03/19/2016, 12:04 PM

## 2016-03-22 ENCOUNTER — Encounter: Payer: Self-pay | Admitting: Internal Medicine

## 2016-03-25 ENCOUNTER — Ambulatory Visit (INDEPENDENT_AMBULATORY_CARE_PROVIDER_SITE_OTHER): Payer: PPO | Admitting: Internal Medicine

## 2016-03-25 ENCOUNTER — Encounter: Payer: Self-pay | Admitting: Internal Medicine

## 2016-03-25 VITALS — BP 92/62 | HR 58 | Ht 69.0 in | Wt 204.4 lb

## 2016-03-25 DIAGNOSIS — I10 Essential (primary) hypertension: Secondary | ICD-10-CM

## 2016-03-25 DIAGNOSIS — I482 Chronic atrial fibrillation, unspecified: Secondary | ICD-10-CM

## 2016-03-25 LAB — CBC WITH DIFFERENTIAL/PLATELET
Basophils Absolute: 0 cells/uL (ref 0–200)
Basophils Relative: 0 %
Eosinophils Absolute: 134 cells/uL (ref 15–500)
Eosinophils Relative: 2 %
HCT: 39.6 % (ref 38.5–50.0)
Hemoglobin: 13.8 g/dL (ref 13.2–17.1)
Lymphocytes Relative: 16 %
Lymphs Abs: 1072 cells/uL (ref 850–3900)
MCH: 30.9 pg (ref 27.0–33.0)
MCHC: 34.8 g/dL (ref 32.0–36.0)
MCV: 88.8 fL (ref 80.0–100.0)
MPV: 9.7 fL (ref 7.5–12.5)
Monocytes Absolute: 804 cells/uL (ref 200–950)
Monocytes Relative: 12 %
Neutro Abs: 4690 cells/uL (ref 1500–7800)
Neutrophils Relative %: 70 %
Platelets: 185 10*3/uL (ref 140–400)
RBC: 4.46 MIL/uL (ref 4.20–5.80)
RDW: 13.6 % (ref 11.0–15.0)
WBC: 6.7 10*3/uL (ref 3.8–10.8)

## 2016-03-25 MED ORDER — METOPROLOL SUCCINATE ER 25 MG PO TB24: 12.5000 mg | ORAL_TABLET | Freq: Every day | ORAL | Status: DC

## 2016-03-25 NOTE — Patient Instructions (Addendum)
Medication Instructions:  Your physician has recommended you make the following change in your medication:  1) DECREASE Metoprolol to 1/2 tablet (12.5 mg) once daily   Labwork: Your physician recommends that you return for lab work today: CBC   Testing/Procedures: Your physician has requested that you have a TEE. During a TEE, sound waves are used to create images of your heart. It provides your doctor with information about the size and shape of your heart and how well your heart's chambers and valves are working. In this test, a transducer is attached to the end of a flexible tube that's guided down your throat and into your esophagus (the tube leading from you mouth to your stomach) to get a more detailed image of your heart. You are not awake for the procedure. Please see the instruction sheet given to you today. For further information please visit [email or web address].  04/30/16  Please arrive at [facility] [place] not eat or drink after midnight the night before your procedure Okay to take your medications the morning of the procedure with a small sip of water  Will need someone to drive you home after the procedure     Follow-Up: Your physician wants you to follow-up in: 6 weeks with Chanetta Marshall, NP. Week of 05/03/16   Any Other Special Instructions Will Be Listed Below (If Applicable).

## 2016-03-25 NOTE — Progress Notes (Signed)
Electrophysiology Office Note   Date:  03/25/2016   ID:  KEVINN TSENG, DOB Oct 19, 1940, MRN [id number]  PCP:  [name], MD  Cardiologist:  Dr [name] Primary Electrophysiologist: Zylah Elsbernd    Chief Complaint  Patient presents with  . Atrial Fibrillation     History of Present Illness: [name] is a 75 y.o. male who presents today for watchman follow up. He has done well post procedure with the exception of his blood pressure being a little soft.  He has had some orthostatic intolerance with this. He has not noticed any bleeding issues. Today, he denies symptoms of palpitations, chest pain, shortness of breath, orthopnea, PND, lower extremity edema, claudication, presyncope, syncope, bleeding, or neurologic sequela. The patient is tolerating medications without difficulties and is otherwise without complaint today.    Past Medical History:  Diagnosis Date  . Aneurysm ([place])   . Arthritis   . Degenerative joint disease   . History of blood transfusion 12/2007   "bleeding for Warfarin"  . Hyperlipidemia   . Hypertension    Admitted for chest pain and 06/2011-neg stress Myoview,mod. LVH with nl EF on echo; TSH of 3.75  . Meniere's disease   . Nephrolithiasis   . 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 Permanent atrial fibrillation ([place])    a. s/p Watchman  . Renal hemorrhage, right 2009   2009 while anticoagulated with Coumadin; treated with the renal artery embolization   Past Surgical History:  Procedure Laterality Date  . EMBOLIZATION  12/2007   Left renal hematoma, status post angiogram and embolization/notes 09/29/2010  . FRACTURE SURGERY    . LEFT ATRIAL APPENDAGE OCCLUSION N/A 03/18/2016   Procedure: LEFT ATRIAL APPENDAGE OCCLUSION;  Surgeon: [name], MD;  Location: [facility];  Service: Cardiovascular;  Laterality: N/A;  . ORIF FEMUR FRACTURE Right 1960   "2 steel rods in leg"  . PILONIDAL  CYST EXCISION    . TEE WITHOUT CARDIOVERSION N/A 03/10/2016   Procedure: TRANSESOPHAGEAL ECHOCARDIOGRAM (TEE);  Surgeon: Josue Hector, MD;  Location: [facility] ENDOSCOPY;  Service: Cardiovascular;  Laterality: N/A;     Current Outpatient Prescriptions  Medication Sig Dispense Refill  . acetaminophen (TYLENOL) 500 MG tablet Take 1,000 mg by mouth every 8 (eight) hours as needed (pain).    Marland Kitchen apixaban (ELIQUIS) 5 MG TABS tablet Take 1 tablet (5 mg total) by mouth 2 (two) times daily. 60 tablet 6  . Ascorbic Acid (VITAMIN C) 1000 MG tablet Take 1,000 mg by mouth daily.    Marland Kitchen aspirin 81 MG tablet Take 81 mg by mouth daily.    . cetirizine (ZYRTEC) 10 MG tablet Take 10 mg by mouth at bedtime.     Marland Kitchen diltiazem (CARDIZEM CD) 240 MG 24 hr capsule Take 240 mg by mouth at bedtime.     . fluticasone (FLONASE) 50 MCG/ACT nasal spray Place 2 sprays into both nostrils every evening.     . meclizine (ANTIVERT) 25 MG tablet Take 25 mg by mouth 2 (two) times daily as needed. For dizziness    . Multiple Vitamin (MULTIVITAMIN) tablet Take 1 tablet by mouth daily.    . pravastatin (PRAVACHOL) 20 MG tablet TAKE ONE TABLET BY MOUTH ONCE EVERY EVENING. 90 tablet 3  . traZODone (DESYREL) 50 MG tablet Take 50 mg by mouth at bedtime.    . triamterene-hydrochlorothiazide (MAXZIDE-25) 37.5-25 MG per tablet Take 1 tablet by mouth daily.    Marland Kitchen  Vitamin D, Cholecalciferol, 1000 units TABS Take 1,000 Units by mouth at bedtime.     No current facility-administered medications for this visit.     Allergies:   Statins and Xarelto [rivaroxaban]   Social History:  The patient  reports that he quit smoking about 8 years ago. He has a 40.00 pack-year smoking history. He has never used smokeless tobacco. He reports that he does not drink alcohol or use drugs.   Family History:  The patient's family history includes Atrial fibrillation in his sister; Cancer in his mother; Hyperlipidemia in his mother; Hypertension in his mother; Stroke  in his father.    ROS:  Please see the history of present illness.   All other systems are reviewed and negative.    PHYSICAL EXAM: VS:  BP 92/62   Pulse (!) 58   Ht 5\' 9"  (1.753 m)   Wt 204 lb 6.4 oz (92.7 kg)   BMI 30.18 kg/m  , BMI Body mass index is 30.18 kg/m. GEN: Well nourished, well developed, in no acute distress  HEENT: normal  Neck: no JVD, carotid bruits, or masses Cardiac: iRRR; no murmurs, rubs, or gallops,no edema  Respiratory:  clear to auscultation bilaterally, normal work of breathing GI: soft, nontender, nondistended, + BS MS: no deformity or atrophy  Skin: warm and dry  Neuro:  Strength and sensation are intact Psych: euthymic mood, full affect  EKG:  EKG is not ordered today.  Recent Labs: 03/18/2016: Hemoglobin 15.1; Platelets 176 03/19/2016: BUN 17; Creatinine, Ser 1.12; Potassium 4.1; Sodium 137    Lipid Panel     Component Value Date/Time   CHOL 161 12/08/2012 0705   TRIG 165 (H) 12/08/2012 0705   HDL 37 (L) 12/08/2012 0705   CHOLHDL 4.4 12/08/2012 0705   VLDL 33 12/08/2012 0705   LDLCALC 91 12/08/2012 0705     Wt Readings from Last 3 Encounters:  03/25/16 204 lb 6.4 oz (92.7 kg)  03/19/16 192 lb 14.4 oz (87.5 kg)  03/10/16 206 lb (93.4 kg)      ASSESSMENT AND PLAN:  1.  Persistent afib Doing well s/p Watchman.   Continue Eliquis and ASA for now TEE at 6 weeks post procedure CBC today  2.  Hypotension Decrease Metoprolol to 12.5 mg daily today CBC today Consider decreasing Maxzide if persists   Current medicines are reviewed at length with the patient today.   The patient does not have concerns regarding his medicines.  The following changes were made today:  None  Follow up with EP NP 6 weeks  Signed, [name], MD  03/25/2016 1:12 PM     [address] [place] [address] [phone] (office) [phone] (fax)

## 2016-04-04 ENCOUNTER — Encounter (HOSPITAL_COMMUNITY): Payer: Self-pay | Admitting: Emergency Medicine

## 2016-04-04 ENCOUNTER — Emergency Department (HOSPITAL_COMMUNITY): Payer: PPO

## 2016-04-04 ENCOUNTER — Inpatient Hospital Stay (HOSPITAL_COMMUNITY)
Admission: EM | Admit: 2016-04-04 | Discharge: 2016-04-07 | DRG: 871 | Disposition: A | Discharge status: Home / Self Care | Payer: PPO | Attending: Internal Medicine | Admitting: Internal Medicine

## 2016-04-04 DIAGNOSIS — Z888 Allergy status to other drugs, medicaments and biological substances status: Secondary | ICD-10-CM

## 2016-04-04 DIAGNOSIS — Z6830 Body mass index (BMI) 30.0-30.9, adult: Secondary | ICD-10-CM

## 2016-04-04 DIAGNOSIS — R739 Hyperglycemia, unspecified: Secondary | ICD-10-CM | POA: Diagnosis not present

## 2016-04-04 DIAGNOSIS — R05 Cough: Secondary | ICD-10-CM | POA: Diagnosis not present

## 2016-04-04 DIAGNOSIS — A419 Sepsis, unspecified organism: Secondary | ICD-10-CM | POA: Diagnosis present

## 2016-04-04 DIAGNOSIS — I482 Chronic atrial fibrillation, unspecified: Secondary | ICD-10-CM | POA: Diagnosis present

## 2016-04-04 DIAGNOSIS — E876 Hypokalemia: Secondary | ICD-10-CM | POA: Diagnosis not present

## 2016-04-04 DIAGNOSIS — H8109 Meniere's disease, unspecified ear: Secondary | ICD-10-CM | POA: Diagnosis present

## 2016-04-04 DIAGNOSIS — Z7982 Long term (current) use of aspirin: Secondary | ICD-10-CM | POA: Diagnosis not present

## 2016-04-04 DIAGNOSIS — I739 Peripheral vascular disease, unspecified: Secondary | ICD-10-CM | POA: Diagnosis not present

## 2016-04-04 DIAGNOSIS — Z87891 Personal history of nicotine dependence: Secondary | ICD-10-CM

## 2016-04-04 DIAGNOSIS — H6692 Otitis media, unspecified, left ear: Secondary | ICD-10-CM | POA: Diagnosis present

## 2016-04-04 DIAGNOSIS — M1990 Unspecified osteoarthritis, unspecified site: Secondary | ICD-10-CM | POA: Diagnosis present

## 2016-04-04 DIAGNOSIS — Z8249 Family history of ischemic heart disease and other diseases of the circulatory system: Secondary | ICD-10-CM | POA: Diagnosis not present

## 2016-04-04 DIAGNOSIS — E785 Hyperlipidemia, unspecified: Secondary | ICD-10-CM

## 2016-04-04 DIAGNOSIS — J181 Lobar pneumonia, unspecified organism: Secondary | ICD-10-CM | POA: Diagnosis not present

## 2016-04-04 DIAGNOSIS — E871 Hypo-osmolality and hyponatremia: Secondary | ICD-10-CM | POA: Diagnosis not present

## 2016-04-04 DIAGNOSIS — I714 Abdominal aortic aneurysm, without rupture: Secondary | ICD-10-CM | POA: Diagnosis present

## 2016-04-04 DIAGNOSIS — R0602 Shortness of breath: Secondary | ICD-10-CM | POA: Diagnosis not present

## 2016-04-04 DIAGNOSIS — N179 Acute kidney failure, unspecified: Secondary | ICD-10-CM | POA: Diagnosis present

## 2016-04-04 DIAGNOSIS — Z7901 Long term (current) use of anticoagulants: Secondary | ICD-10-CM

## 2016-04-04 DIAGNOSIS — E860 Dehydration: Secondary | ICD-10-CM | POA: Diagnosis not present

## 2016-04-04 DIAGNOSIS — Z66 Do not resuscitate: Secondary | ICD-10-CM | POA: Diagnosis not present

## 2016-04-04 DIAGNOSIS — I10 Essential (primary) hypertension: Secondary | ICD-10-CM | POA: Diagnosis not present

## 2016-04-04 DIAGNOSIS — J189 Pneumonia, unspecified organism: Secondary | ICD-10-CM | POA: Diagnosis not present

## 2016-04-04 DIAGNOSIS — Z79899 Other long term (current) drug therapy: Secondary | ICD-10-CM | POA: Diagnosis not present

## 2016-04-04 HISTORY — DX: Acute kidney failure, unspecified: N17.9

## 2016-04-04 HISTORY — DX: Sepsis, unspecified organism: A41.9

## 2016-04-04 HISTORY — DX: Hypo-osmolality and hyponatremia: E87.1

## 2016-04-04 LAB — CBC WITH DIFFERENTIAL/PLATELET
Basophils Absolute: 0 10*3/uL (ref 0.0–0.1)
Basophils Relative: 0 %
Eosinophils Absolute: 0 10*3/uL (ref 0.0–0.7)
Eosinophils Relative: 0 %
HCT: 38.3 % — ABNORMAL LOW (ref 39.0–52.0)
Hemoglobin: 13.3 g/dL (ref 13.0–17.0)
Lymphocytes Relative: 3 %
Lymphs Abs: 0.4 10*3/uL — ABNORMAL LOW (ref 0.7–4.0)
MCH: 31.4 pg (ref 26.0–34.0)
MCHC: 34.7 g/dL (ref 30.0–36.0)
MCV: 90.3 fL (ref 78.0–100.0)
Monocytes Absolute: 1.6 10*3/uL — ABNORMAL HIGH (ref 0.1–1.0)
Monocytes Relative: 11 %
Neutro Abs: 12.7 10*3/uL — ABNORMAL HIGH (ref 1.7–7.7)
Neutrophils Relative %: 86 %
Platelets: 198 10*3/uL (ref 150–400)
RBC: 4.24 MIL/uL (ref 4.22–5.81)
RDW: 14 % (ref 11.5–15.5)
WBC: 14.7 10*3/uL — ABNORMAL HIGH (ref 4.0–10.5)

## 2016-04-04 LAB — COMPREHENSIVE METABOLIC PANEL
ALT: 17 U/L (ref 17–63)
AST: 17 U/L (ref 15–41)
Albumin: 3.1 g/dL — ABNORMAL LOW (ref 3.5–5.0)
Alkaline Phosphatase: 54 U/L (ref 38–126)
Anion gap: 10 (ref 5–15)
BUN: 18 mg/dL (ref 6–20)
CO2: 25 mmol/L (ref 22–32)
Calcium: 8.6 mg/dL — ABNORMAL LOW (ref 8.9–10.3)
Chloride: 96 mmol/L — ABNORMAL LOW (ref 101–111)
Creatinine, Ser: 1.29 mg/dL — ABNORMAL HIGH (ref 0.61–1.24)
GFR calc Af Amer: >60 mL/min (ref >60)
GFR calc non Af Amer: 53 mL/min — ABNORMAL LOW (ref >60)
Glucose, Bld: 148 mg/dL — ABNORMAL HIGH (ref 65–99)
Potassium: 3.8 mmol/L (ref 3.5–5.1)
Sodium: 131 mmol/L — ABNORMAL LOW (ref 135–145)
Total Bilirubin: 1.1 mg/dL (ref 0.3–1.2)
Total Protein: 7.1 g/dL (ref 6.5–8.1)

## 2016-04-04 LAB — BRAIN NATRIURETIC PEPTIDE: B Natriuretic Peptide: 249 pg/mL — ABNORMAL HIGH (ref 0.0–100.0)

## 2016-04-04 LAB — I-STAT CG4 LACTIC ACID, ED: Lactic Acid, Venous: 2.06 mmol/L (ref 0.5–1.9)

## 2016-04-04 LAB — TROPONIN I: Troponin I: <0.03 ng/mL (ref <0.03)

## 2016-04-04 LAB — LACTIC ACID, PLASMA: Lactic Acid, Venous: 1.6 mmol/L (ref 0.5–1.9)

## 2016-04-04 MED ORDER — VITAMIN C 500 MG PO TABS
1000.0000 mg | ORAL_TABLET | Freq: Every day | ORAL | Status: DC
Start: 2016-04-05 — End: 2016-04-07
  Administered 2016-04-05 – 2016-04-07 (×3): 1000 mg via ORAL
  Filled 2016-04-04 (×3): qty 2

## 2016-04-04 MED ORDER — LACTATED RINGERS IV SOLN
INTRAVENOUS | Status: DC
  Administered 2016-04-05 – 2016-04-06 (×4): via INTRAVENOUS

## 2016-04-04 MED ORDER — METOPROLOL SUCCINATE ER 25 MG PO TB24
12.5000 mg | ORAL_TABLET | Freq: Every day | ORAL | Status: DC
  Administered 2016-04-05 – 2016-04-07 (×3): 12.5 mg via ORAL
  Filled 2016-04-04 (×4): qty 1

## 2016-04-04 MED ORDER — DEXTROSE 5 % IV SOLN
1.0000 g | Freq: Once | INTRAVENOUS | Status: AC
  Administered 2016-04-04: 1 g via INTRAVENOUS
  Filled 2016-04-04: qty 10

## 2016-04-04 MED ORDER — AZITHROMYCIN 250 MG PO TABS
500.0000 mg | ORAL_TABLET | ORAL | Status: DC
  Administered 2016-04-05 – 2016-04-06 (×2): 500 mg via ORAL
  Filled 2016-04-04 (×2): qty 2

## 2016-04-04 MED ORDER — ASPIRIN EC 81 MG PO TBEC
81.0000 mg | DELAYED_RELEASE_TABLET | Freq: Every day | ORAL | Status: DC
  Administered 2016-04-05 – 2016-04-07 (×3): 81 mg via ORAL
  Filled 2016-04-04 (×3): qty 1

## 2016-04-04 MED ORDER — SODIUM CHLORIDE 0.9 % IV BOLUS (SEPSIS)
1000.0000 mL | Freq: Once | INTRAVENOUS | Status: AC
  Administered 2016-04-04: 1000 mL via INTRAVENOUS

## 2016-04-04 MED ORDER — LORATADINE 10 MG PO TABS
10.0000 mg | ORAL_TABLET | Freq: Every day | ORAL | Status: DC
  Administered 2016-04-05 – 2016-04-07 (×3): 10 mg via ORAL
  Filled 2016-04-04 (×3): qty 1

## 2016-04-04 MED ORDER — PRAVASTATIN SODIUM 40 MG PO TABS
20.0000 mg | ORAL_TABLET | Freq: Every day | ORAL | Status: DC
  Administered 2016-04-05 – 2016-04-06 (×2): 20 mg via ORAL
  Filled 2016-04-04 (×4): qty 1

## 2016-04-04 MED ORDER — APIXABAN 5 MG PO TABS
5.0000 mg | ORAL_TABLET | Freq: Two times a day (BID) | ORAL | Status: DC
  Administered 2016-04-05 – 2016-04-07 (×6): 5 mg via ORAL
  Filled 2016-04-04 (×6): qty 1

## 2016-04-04 MED ORDER — LABETALOL HCL 5 MG/ML IV SOLN: 10.0000 mg | INTRAVENOUS | Status: DC | PRN

## 2016-04-04 MED ORDER — ALBUTEROL SULFATE (2.5 MG/3ML) 0.083% IN NEBU: 2.5000 mg | INHALATION_SOLUTION | RESPIRATORY_TRACT | Status: DC | PRN

## 2016-04-04 MED ORDER — DEXTROSE 5 % IV SOLN
500.0000 mg | Freq: Once | INTRAVENOUS | Status: AC
  Administered 2016-04-04: 500 mg via INTRAVENOUS
  Filled 2016-04-04: qty 500

## 2016-04-04 MED ORDER — IPRATROPIUM-ALBUTEROL 0.5-2.5 (3) MG/3ML IN SOLN
3.0000 mL | Freq: Four times a day (QID) | RESPIRATORY_TRACT | Status: DC
  Administered 2016-04-04 – 2016-04-06 (×8): 3 mL via RESPIRATORY_TRACT
  Filled 2016-04-04 (×8): qty 3

## 2016-04-04 MED ORDER — DILTIAZEM HCL ER COATED BEADS 240 MG PO CP24
240.0000 mg | ORAL_CAPSULE | Freq: Every day | ORAL | Status: DC
  Administered 2016-04-05 – 2016-04-06 (×2): 240 mg via ORAL
  Filled 2016-04-04 (×2): qty 1

## 2016-04-04 MED ORDER — MORPHINE SULFATE (PF) 2 MG/ML IV SOLN: 2.0000 mg | INTRAVENOUS | Status: DC | PRN

## 2016-04-04 MED ORDER — DEXTROSE 5 % IV SOLN
1.0000 g | INTRAVENOUS | Status: DC
  Administered 2016-04-05 – 2016-04-06 (×2): 1 g via INTRAVENOUS
  Filled 2016-04-04 (×3): qty 10

## 2016-04-04 MED ORDER — FLUTICASONE PROPIONATE 50 MCG/ACT NA SUSP
2.0000 | Freq: Every evening | NASAL | Status: DC
  Administered 2016-04-05 – 2016-04-06 (×2): 2 via NASAL
  Filled 2016-04-04: qty 16

## 2016-04-04 MED ORDER — CIPROFLOXACIN-DEXAMETHASONE 0.3-0.1 % OT SUSP
4.0000 [drp] | Freq: Two times a day (BID) | OTIC | Status: DC
  Administered 2016-04-05 – 2016-04-07 (×6): 4 [drp] via OTIC
  Filled 2016-04-04: qty 7.5

## 2016-04-04 MED ORDER — TRAZODONE HCL 50 MG PO TABS
50.0000 mg | ORAL_TABLET | Freq: Every day | ORAL | Status: DC
  Administered 2016-04-05 – 2016-04-06 (×3): 50 mg via ORAL
  Filled 2016-04-04 (×3): qty 1

## 2016-04-04 MED ORDER — ALBUTEROL SULFATE (2.5 MG/3ML) 0.083% IN NEBU
5.0000 mg | INHALATION_SOLUTION | Freq: Once | RESPIRATORY_TRACT | Status: AC
  Administered 2016-04-04: 5 mg via RESPIRATORY_TRACT
  Filled 2016-04-04: qty 6

## 2016-04-04 NOTE — ED Triage Notes (Signed)
Pt reports he was seen by PCP for cough, congestion, and ear infection. States he started having SOB yesterday, worsened today, denies CP at this time. Pt had procedure for A Fib in Oct. According to wife.

## 2016-04-04 NOTE — H&P (Signed)
History and Physical    [name] [id number] DOB: July 06, 1940 DOA: 04/04/2016  PCP: [name], MD Consultants:  Elbert Ewings- cardiology Patient coming from: home - lives with wife; Donald Prose, wife, [phone], [phone]  Chief Complaint: SOB  HPI: [name] is a 75 y.o. male with medical history significant of Afib on anticoagulation (since recent Watchman procedure), AAA, HTN, and HLD presenting with CAP.  Hasn't been feeling well for the last few days.  Diagnosed with an ear infection yesterday AM and started on antibiotic ear drop.  Struggled with ongoing ear pain during the day yesterday without improvement.  Started with SOB this afternoon.  Mild cough Friday and yesterday, worse today, productive of brownish sputum.  Subjective fever this AM.  Decreased appetite for a few days, not drinking well.  Mild diarrhea since yesterday, 3+ yesterday and 2 today.   ED Course: Per Dr. Stark Jock: Patient presents with cough, congestion, and shortness of breath since yesterday. His checks x-ray reveals a left-sided pneumonia. He is also tachycardic with an elevated white count. He was given Rocephin and Zithromax. I've spoken with Dr. Lorin Mercy from the hospitalist service who will admit the patient.  Review of Systems: As per HPI; otherwise 10 point review of systems reviewed and negative.   Ambulatory Status:  ambulates independently  Past Medical History:  Diagnosis Date  . Aneurysm ([place])   . Arthritis   . Degenerative joint disease   . History of blood transfusion 12/2007   "bleeding for Warfarin"  . Hyperlipidemia   . Hypertension    Admitted for chest pain and 06/2011-neg stress Myoview,mod. LVH with nl EF on echo; TSH of 3.75  . Meniere's disease   . Nephrolithiasis   . 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 Permanent atrial fibrillation ([place])    a. s/p Watchman  . Renal hemorrhage, right 2009   2009 while  anticoagulated with Coumadin; treated with the renal artery embolization    Past Surgical History:  Procedure Laterality Date  . EMBOLIZATION  12/2007   Left renal hematoma, status post angiogram and embolization/notes 09/29/2010  . FRACTURE SURGERY    . LEFT ATRIAL APPENDAGE OCCLUSION N/A 03/18/2016   Procedure: LEFT ATRIAL APPENDAGE OCCLUSION;  Surgeon: [name], MD;  Location: [facility];  Service: Cardiovascular;  Laterality: N/A;  . ORIF FEMUR FRACTURE Right 1960   "2 steel rods in leg"  . PILONIDAL CYST EXCISION    . TEE WITHOUT CARDIOVERSION N/A 03/10/2016   Procedure: TRANSESOPHAGEAL ECHOCARDIOGRAM (TEE);  Surgeon: Josue Hector, MD;  Location: [facility] ENDOSCOPY;  Service: Cardiovascular;  Laterality: N/A;    Social History   Social History  . Marital status: Married    Spouse name: N/A  . Number of children: 2  . Years of education: N/A   Occupational History  . Retired     Chief Technology Officer job   Social History Main Topics  . Smoking status: Former Smoker    Packs/day: 1.00    Years: 40.00    Quit date: 01/20/2008  . Smokeless tobacco: Never Used  . Alcohol use 0.6 oz/week    1 Standard drinks or equivalent per week     Comment: rare wine  . Drug use: No  . Sexual activity: Yes   Other Topics Concern  . Not on file   Social History Narrative   Retired from Architect   Lives in [place]  Allergen Reactions  . Statins     Muscle aches  . Xarelto [Rivaroxaban]     Developed redness in both legs    Family History  Problem Relation Age of Onset  . Hypertension Mother   . Cancer Mother   . Hyperlipidemia Mother   . Atrial fibrillation Sister   . Stroke Father     Prior to Admission medications   Medication Sig Start Date End Date Taking? Authorizing Provider  acetaminophen (TYLENOL) 500 MG tablet Take 1,000 mg by mouth every 8 (eight) hours as needed (pain).   Yes Historical Provider, MD  apixaban (ELIQUIS) 5  MG TABS tablet Take 1 tablet (5 mg total) by mouth 2 (two) times daily. 03/19/16  Yes Eileen Stanford, PA-C  Ascorbic Acid (VITAMIN C) 1000 MG tablet Take 1,000 mg by mouth daily.   Yes Historical Provider, MD  aspirin 81 MG tablet Take 81 mg by mouth daily.   Yes Historical Provider, MD  cetirizine (ZYRTEC) 10 MG tablet Take 10 mg by mouth at bedtime.    Yes Historical Provider, MD  CIPRODEX otic suspension Place 4 drops in ear(s) 2 (two) times daily. 04/03/16  Yes Historical Provider, MD  diltiazem (CARDIZEM CD) 240 MG 24 hr capsule Take 240 mg by mouth at bedtime.    Yes Historical Provider, MD  fluticasone (FLONASE) 50 MCG/ACT nasal spray Place 2 sprays into both nostrils every evening.  02/23/16  Yes Historical Provider, MD  meclizine (ANTIVERT) 25 MG tablet Take 25 mg by mouth 2 (two) times daily as needed. For dizziness   Yes Historical Provider, MD  metoprolol succinate (TOPROL-XL) 25 MG 24 hr tablet Take 0.5 tablets (12.5 mg total) by mouth daily. 03/25/16  Yes [name], MD  Multiple Vitamin (MULTIVITAMIN) tablet Take 1 tablet by mouth daily.   Yes Historical Provider, MD  pravastatin (PRAVACHOL) 20 MG tablet TAKE ONE TABLET BY MOUTH ONCE EVERY EVENING. 04/03/13  Yes Lendon Colonel, NP  traZODone (DESYREL) 50 MG tablet Take 50 mg by mouth at bedtime.   Yes Historical Provider, MD  triamterene-hydrochlorothiazide (MAXZIDE-25) 37.5-25 MG per tablet Take 1 tablet by mouth daily. 09/20/11  Yes Yehuda Savannah, MD  Vitamin D, Cholecalciferol, 1000 units TABS Take 1,000 Units by mouth at bedtime.   Yes Historical Provider, MD    Physical Exam: Vitals:   04/04/16 2030 04/04/16 2033 04/04/16 2100 04/04/16 2130  BP: 124/75  115/77 111/79  Pulse: (!) 125  115 (!) 123  Resp: (!) 40  (!) 34 (!) 29  Temp:      TempSrc:      SpO2: 94% 94% 95% 95%  Weight:      Height:         General: Appears calm and comfortable and is NAD, mild tachypnea evident Eyes:  PERRL, EOMI, normal lids,  iris ENT:  grossly normal hearing, lips & tongue, mmm Neck:  no LAD, masses or thyromegaly Cardiovascular:  Irregularly irregular rate/rhythm with mild tachycardia, no m/r/g. No LE edema.  Respiratory:  Scant left-sided ronchi, no w/r/c. Mild tachypnea. Abdomen:  soft, ntnd, NABS Skin:  no rash or induration seen on limited exam Musculoskeletal:  grossly normal tone BUE/BLE, good ROM, no bony abnormality Psychiatric:  grossly normal mood and affect, speech fluent and appropriate, AOx3 Neurologic:  CN 2-12 grossly intact, moves all extremities in coordinated fashion, sensation intact  Labs on Admission: I have personally reviewed following labs and imaging studies  CBC:  Recent Labs Lab 04/04/16  2018  WBC 14.7*  NEUTROABS 12.7*  HGB 13.3  HCT 38.3*  MCV 90.3  PLT 99991111   Basic Metabolic Panel:  Recent Labs Lab 04/04/16 2018  NA 131*  K 3.8  CL 96*  CO2 25  GLUCOSE 148*  BUN 18  CREATININE 1.29*  CALCIUM 8.6*   GFR: Estimated Creatinine Clearance: 57.4 mL/min (by C-G formula based on SCr of 1.29 mg/dL (H)). Liver Function Tests:  Recent Labs Lab 04/04/16 2018  AST 17  ALT 17  ALKPHOS 54  BILITOT 1.1  PROT 7.1  ALBUMIN 3.1*   No results for input(s): LIPASE, AMYLASE in the last 168 hours. No results for input(s): AMMONIA in the last 168 hours. Coagulation Profile: No results for input(s): INR, PROTIME in the last 168 hours. Cardiac Enzymes:  Recent Labs Lab 04/04/16 2018  TROPONINI <0.03   BNP (last 3 results) No results for input(s): PROBNP in the last 8760 hours. HbA1C: No results for input(s): HGBA1C in the last 72 hours. CBG: No results for input(s): GLUCAP in the last 168 hours. Lipid Profile: No results for input(s): CHOL, HDL, LDLCALC, TRIG, CHOLHDL, LDLDIRECT in the last 72 hours. Thyroid Function Tests: No results for input(s): TSH, T4TOTAL, FREET4, T3FREE, THYROIDAB in the last 72 hours. Anemia Panel: No results for input(s): VITAMINB12,  FOLATE, FERRITIN, TIBC, IRON, RETICCTPCT in the last 72 hours. Urine analysis:    Component Value Date/Time   COLORURINE YELLOW 01/07/2008 [place] 01/07/2008 1321   LABSPEC 1.025 01/07/2008 1321   PHURINE 6.0 01/07/2008 1321   GLUCOSEU NEGATIVE 01/07/2008 1321   HGBUR SMALL (A) 01/07/2008 1321   BILIRUBINUR NEGATIVE 01/07/2008 1321   KETONESUR NEGATIVE 01/07/2008 1321   PROTEINUR NEGATIVE 01/07/2008 1321   UROBILINOGEN 0.2 01/07/2008 1321   NITRITE NEGATIVE 01/07/2008 1321   LEUKOCYTESUR NEGATIVE 01/07/2008 1321    Creatinine Clearance: Estimated Creatinine Clearance: 57.4 mL/min (by C-G formula based on SCr of 1.29 mg/dL (H)).  Sepsis Labs: @LABRCNTIP (procalcitonin:4,lacticidven:4) )No results found for this or any previous visit (from the past 240 hour(s)).   Radiological Exams on Admission: Dg Chest 2 View  Result Date: 04/04/2016 CLINICAL DATA:  Initial evaluation for acute shortness of breath, cough, congestion. EXAM: CHEST  2 VIEW COMPARISON:  Prior radiograph from 11/21/2014. FINDINGS: Cardiomegaly is stable from prior. Mediastinal silhouette within normal limits. Lungs are mildly hypoinflated. There is patchy parenchymal opacity within the left upper lobe, concerning for possible pneumonia. Some of this opacity is somewhat more wedge-shaped, and likely reflects superimposed atelectasis. Additional patchy opacity at the left lung base may reflect additional infiltrate or atelectasis. Small left pleural effusion. Right lung is clear. No pulmonary edema. No pneumothorax. No acute osseous abnormality. IMPRESSION: 1. Patchy left upper lobe opacity, concerning for possible pneumonia given the history of cough and congestion. Radiographic follow-up to resolution is recommended as an underlying mass is not excluded. 2. Additional left basilar opacity, which may reflect additional infiltrate and/or atelectasis. 3. Small left pleural effusion. 4. Stable cardiomegaly.  Electronically Signed   By: Jeannine Boga M.D.   On: 04/04/2016 21:04    EKG: Independently reviewed.  NSR with rate 122; nonspecific ST changes with no evidence of acute ischemia  Assessment/Plan Principal Problem:   Sepsis ([place]) Active Problems:   Hypertension   Hyperlipidemia   Chronic atrial fibrillation (HCC)   CAP (community acquired pneumonia)   Hyponatremia   AKI (acute kidney injury) ([facility])   Hyperglycemia   Sepsis, likely due to CAP -Given  productive cough, mildly decreased oxygen saturation, tachycardia, tachypnea, and leukocytosis in conjunction with infiltrate and possibly multifocal PNA on chest x-ray, most likely community-acquired pneumonia.  -Other etiologies include aspiration (possibly during recent Watchman insertion) versus URI (most likely viral). -He does meet sepsis criteria and has an elevated lactate but has not had hypotension or other hemodynamic instability. -Will start-Azithromycin 500 mg IV x 1 and then 500 mg PO daily AND Rocephin. -Sepsis protocol initiated; has not yet received all of IVF bolus but started on abx and IVF ordered by Dr. Stark Jock. -Blood and sputum cultures pending, as well as Strep pneumo testing -Will admit with telemetry and continue to monitor -Will trend lactate to ensure improvement - LR @ 75cc/hr - Fever control - Repeat CBC in am - albuterol PRN - Duonebs q6h  Hyponatremia/AKI -Thought to be due to volume deficiency -Dr. Stark Jock to order bolus in ER -Will continue overnight IVF and recheck BMP in AM  HTN -Continue Cardizem and Toprol -prn Labetolol for tachycardia -If unable to control HR with home meds once he has been given IVF, he may need transfer to SDU for Cardizem drip for rate control  HLD -Continue Pravachol  Afib -s/p Watchman insertion on 10/19 -CHA2DS2-VASc score is 3 -Patient on Eliquis  Hyperglycemia -h/o intermittent hyperglycemia -No A1c located in Epic -Will order A1c and if elevated, may need  SSI   DVT prophylaxis: Eliquis Code Status: DNR - confirmed with patient/family Family Communication: wife present throughout evaluation Disposition Plan:  Home once clinically improved Consults called: None  Admission status: Admit - It is my clinical opinion that admission to INPATIENT is reasonable and necessary because this patient will require at least 2 midnights in the hospital to treat this condition based on the medical complexity of the problems presented.  Given the aforementioned information, the predictability of an adverse outcome is felt to be significant.     Karmen Bongo MD [facility]  If 7PM-7AM, please contact night-coverage [email or web address] Password [email or web address]  04/04/2016, 10:03 PM

## 2016-04-04 NOTE — ED Provider Notes (Signed)
[facility] DEPT Provider Note   CSN: [id number] Arrival date & time: 04/04/16  2006 By signing my name below, I, Georgette Shell, attest that this documentation has been prepared under the direction and in the presence of Veryl Speak, MD. Electronically Signed: Georgette Shell, ED Scribe. 04/04/16. 8:31 PM.  History   Chief Complaint Chief Complaint  Patient presents with  . Shortness of Breath   HPI Comments: [name] is a 75 y.o. male with h/o A-fib, HTN, and AAA who presents to the Emergency Department complaining of shortness of breath onset one day ago, worsening today. Pt was seen by his PCP for cough, congestion, and an ear infection recently. Per wife, pt had a procedure for A-fib on 03/18/16. Pt quit smoking in 2009. He denies h/o CHF. Pt further denies fever at this time.  The history is provided by the patient. No language interpreter was used.  Shortness of Breath  This is a new problem. The average episode lasts 1 day. The problem occurs frequently.The current episode started yesterday. The problem has been gradually worsening. Associated symptoms include cough. Pertinent negatives include no fever. He has had prior hospitalizations. He has had prior ED visits. Associated medical issues include recent surgery.    Past Medical History:  Diagnosis Date  . Aneurysm ([place])   . Arthritis   . Degenerative joint disease   . History of blood transfusion 12/2007   "bleeding for Warfarin"  . Hyperlipidemia   . Hypertension    Admitted for chest pain and 06/2011-neg stress Myoview,mod. LVH with nl EF on echo; TSH of 3.75  . Meniere's disease   . Nephrolithiasis   . 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 Permanent atrial fibrillation ([place])    a. s/p Watchman  . Renal hemorrhage, right 2009   2009 while anticoagulated with Coumadin; treated with the renal artery embolization    Patient Active Problem List   Diagnosis Date  Noted  . Persistent atrial fibrillation ([place]) 03/18/2016  . Abdominal aortic aneurysm ([place]) 03/24/2012  . Fasting hyperglycemia 03/24/2012  . Peripheral vascular disease ([place]) 09/20/2011  . Chest pain 07/17/2011  . Hypertension   . Hyperlipidemia   . Chronic atrial fibrillation ([place])   . Meniere's disease   . Renal hemorrhage, right     Past Surgical History:  Procedure Laterality Date  . EMBOLIZATION  12/2007   Left renal hematoma, status post angiogram and embolization/notes 09/29/2010  . FRACTURE SURGERY    . LEFT ATRIAL APPENDAGE OCCLUSION N/A 03/18/2016   Procedure: LEFT ATRIAL APPENDAGE OCCLUSION;  Surgeon: [name], MD;  Location: [facility];  Service: Cardiovascular;  Laterality: N/A;  . ORIF FEMUR FRACTURE Right 1960   "2 steel rods in leg"  . PILONIDAL CYST EXCISION    . TEE WITHOUT CARDIOVERSION N/A 03/10/2016   Procedure: TRANSESOPHAGEAL ECHOCARDIOGRAM (TEE);  Surgeon: Josue Hector, MD;  Location: [facility] ENDOSCOPY;  Service: Cardiovascular;  Laterality: N/A;       Home Medications    Prior to Admission medications   Medication Sig Start Date End Date Taking? Authorizing Provider  acetaminophen (TYLENOL) 500 MG tablet Take 1,000 mg by mouth every 8 (eight) hours as needed (pain).    Historical Provider, MD  apixaban (ELIQUIS) 5 MG TABS tablet Take 1 tablet (5 mg total) by mouth 2 (two) times daily. 03/19/16   Eileen Stanford, PA-C  Ascorbic Acid (VITAMIN C) 1000 MG tablet Take  1,000 mg by mouth daily.    Historical Provider, MD  aspirin 81 MG tablet Take 81 mg by mouth daily.    Historical Provider, MD  cetirizine (ZYRTEC) 10 MG tablet Take 10 mg by mouth at bedtime.     Historical Provider, MD  diltiazem (CARDIZEM CD) 240 MG 24 hr capsule Take 240 mg by mouth at bedtime.     Historical Provider, MD  fluticasone Asencion Islam) 50 MCG/ACT nasal spray Place 2 sprays into both nostrils every evening.  02/23/16   Historical Provider, MD  meclizine (ANTIVERT) 25 MG  tablet Take 25 mg by mouth 2 (two) times daily as needed. For dizziness    Historical Provider, MD  metoprolol succinate (TOPROL-XL) 25 MG 24 hr tablet Take 0.5 tablets (12.5 mg total) by mouth daily. 03/25/16   [name], MD  Multiple Vitamin (MULTIVITAMIN) tablet Take 1 tablet by mouth daily.    Historical Provider, MD  pravastatin (PRAVACHOL) 20 MG tablet TAKE ONE TABLET BY MOUTH ONCE EVERY EVENING. 04/03/13   Lendon Colonel, NP  traZODone (DESYREL) 50 MG tablet Take 50 mg by mouth at bedtime.    Historical Provider, MD  triamterene-hydrochlorothiazide (MAXZIDE-25) 37.5-25 MG per tablet Take 1 tablet by mouth daily. 09/20/11   Yehuda Savannah, MD  Vitamin D, Cholecalciferol, 1000 units TABS Take 1,000 Units by mouth at bedtime.    Historical Provider, MD    Family History Family History  Problem Relation Age of Onset  . Hypertension Mother   . Cancer Mother   . Hyperlipidemia Mother   . Atrial fibrillation Sister   . Stroke Father     Social History Social History  Substance Use Topics  . Smoking status: Former Smoker    Packs/day: 1.00    Years: 40.00    Quit date: 01/20/2008  . Smokeless tobacco: Never Used  . Alcohol use No     Allergies   Statins and Xarelto [rivaroxaban]   Review of Systems Review of Systems  Constitutional: Negative for fever.  Respiratory: Positive for cough and shortness of breath.   All other systems reviewed and are negative.    Physical Exam Updated Vital Signs Ht 5\' 9"  (1.753 m)   Wt 205 lb (93 kg)   SpO2 94%   BMI 30.27 kg/m   Physical Exam  Constitutional: He is oriented to person, place, and time. He appears well-developed and well-nourished.  HENT:  Head: Normocephalic and atraumatic.  Eyes: EOM are normal.  Neck: Normal range of motion.  Cardiovascular: Normal heart sounds and intact distal pulses.  An irregularly irregular rhythm present.  Heart is irregularly irregular and rapid.  Pulmonary/Chest: Effort normal.  No respiratory distress. He has wheezes.  Slight expiratory wheezes bilaterally.  Abdominal: Soft. He exhibits no distension. There is no tenderness.  Musculoskeletal: Normal range of motion.  Neurological: He is alert and oriented to person, place, and time.  Skin: Skin is warm. He is diaphoretic.  Psychiatric: He has a normal mood and affect. Judgment normal.  Nursing note and vitals reviewed.    ED Treatments / Results  DIAGNOSTIC STUDIES: Oxygen Saturation is 94% on [place] 2L, poor by my interpretation.    COORDINATION OF CARE: 8:30 PM Discussed treatment plan with pt at bedside which includes lab work and pt agreed to plan.  Labs (all labs ordered are listed, but only abnormal results are displayed) Labs Reviewed  TROPONIN I  COMPREHENSIVE METABOLIC PANEL  CBC WITH DIFFERENTIAL/PLATELET  I-STAT CG4 LACTIC ACID, ED  EKG  EKG Interpretation None       Radiology No results found.  Procedures Procedures (including critical care time)  Medications Ordered in ED Medications  albuterol (PROVENTIL) (2.5 MG/3ML) 0.083% nebulizer solution 5 mg (5 mg Nebulization Given 04/04/16 2021)     Initial Impression / Assessment and Plan / ED Course  I have reviewed the triage vital signs and the nursing notes.  Pertinent labs & imaging results that were available during my care of the patient were reviewed by me and considered in my medical decision making (see chart for details).  Clinical Course     Patient presents with cough, congestion, and shortness of breath since yesterday. His checks x-ray reveals a left-sided pneumonia. He is also tachycardic with an elevated white count. He was given Rocephin and Zithromax. I've spoken with Dr. Lorin Mercy from the hospitalist service who will admit the patient.  Final Clinical Impressions(s) / ED Diagnoses   Final diagnoses:  None    New Prescriptions New Prescriptions   No medications on file   I personally performed the  services described in this documentation, which was scribed in my presence. The recorded information has been reviewed and is accurate.        Veryl Speak, MD 04/04/16 2140

## 2016-04-05 DIAGNOSIS — I482 Chronic atrial fibrillation: Secondary | ICD-10-CM | POA: Diagnosis not present

## 2016-04-05 DIAGNOSIS — N179 Acute kidney failure, unspecified: Secondary | ICD-10-CM | POA: Diagnosis not present

## 2016-04-05 DIAGNOSIS — A419 Sepsis, unspecified organism: Secondary | ICD-10-CM | POA: Diagnosis not present

## 2016-04-05 DIAGNOSIS — J189 Pneumonia, unspecified organism: Secondary | ICD-10-CM | POA: Diagnosis not present

## 2016-04-05 LAB — BASIC METABOLIC PANEL
Anion gap: 8 (ref 5–15)
BUN: 16 mg/dL (ref 6–20)
CO2: 25 mmol/L (ref 22–32)
Calcium: 8 mg/dL — ABNORMAL LOW (ref 8.9–10.3)
Chloride: 99 mmol/L — ABNORMAL LOW (ref 101–111)
Creatinine, Ser: 1.14 mg/dL (ref 0.61–1.24)
GFR calc Af Amer: >60 mL/min (ref >60)
GFR calc non Af Amer: >60 mL/min (ref >60)
Glucose, Bld: 156 mg/dL — ABNORMAL HIGH (ref 65–99)
Potassium: 3.5 mmol/L (ref 3.5–5.1)
Sodium: 132 mmol/L — ABNORMAL LOW (ref 135–145)

## 2016-04-05 LAB — EXPECTORATED SPUTUM ASSESSMENT W GRAM STAIN, RFLX TO RESP C: Special Requests: NORMAL

## 2016-04-05 LAB — CBC WITH DIFFERENTIAL/PLATELET
Basophils Absolute: 0 10*3/uL (ref 0.0–0.1)
Basophils Relative: 0 %
Eosinophils Absolute: 0 10*3/uL (ref 0.0–0.7)
Eosinophils Relative: 0 %
HCT: 33.8 % — ABNORMAL LOW (ref 39.0–52.0)
Hemoglobin: 11.5 g/dL — ABNORMAL LOW (ref 13.0–17.0)
Lymphocytes Relative: 6 %
Lymphs Abs: 0.9 10*3/uL (ref 0.7–4.0)
MCH: 31.1 pg (ref 26.0–34.0)
MCHC: 34 g/dL (ref 30.0–36.0)
MCV: 91.4 fL (ref 78.0–100.0)
Monocytes Absolute: 1.2 10*3/uL — ABNORMAL HIGH (ref 0.1–1.0)
Monocytes Relative: 8 %
Neutro Abs: 12.4 10*3/uL — ABNORMAL HIGH (ref 1.7–7.7)
Neutrophils Relative %: 86 %
Platelets: 173 10*3/uL (ref 150–400)
RBC: 3.7 MIL/uL — ABNORMAL LOW (ref 4.22–5.81)
RDW: 14.1 % (ref 11.5–15.5)
WBC Morphology: INCREASED
WBC: 14.5 10*3/uL — ABNORMAL HIGH (ref 4.0–10.5)

## 2016-04-05 LAB — EXPECTORATED SPUTUM ASSESSMENT W REFEX TO RESP CULTURE

## 2016-04-05 LAB — LACTIC ACID, PLASMA: Lactic Acid, Venous: 2.1 mmol/L (ref 0.5–1.9)

## 2016-04-05 MED ORDER — CIPROFLOXACIN-DEXAMETHASONE 0.3-0.1 % OT SUSP
OTIC | Status: AC
  Filled 2016-04-05: qty 7.5

## 2016-04-05 NOTE — Care Management Important Message (Signed)
Important Message  Patient Details  Name: SEGUNDO NEVEL MRN: [id number] Date of Birth: 11-23-1940   Medicare Important Message Given:  Yes    Sherald Barge, RN 04/05/2016, 10:44 AM

## 2016-04-05 NOTE — Progress Notes (Signed)
PROGRESS NOTE    [name]  [id number] DOB: 07-19-1940 DOA: 04/04/2016 PCP: [name], MD     Brief Narrative:  75 y/o man admitted from home on 11/5 due to SOB, found to have a CAP and admitted for management.   Assessment & Plan:   Principal Problem:   Sepsis ([place]) Active Problems:   Hypertension   Hyperlipidemia   Chronic atrial fibrillation (HCC)   CAP (community acquired pneumonia)   Hyponatremia   AKI (acute kidney injury) ([place])   Hyperglycemia   Sepsis -Due to CAP. -Sepsis parameters improved.  CAP -Continue rocephin/azithro. -Cx data pending. -Symptomatically improved.  ARF -Improved with IVF.  Hyponatremia -Improved with IVF.  HTN -Well controlled.  A Fib -Fair rate control. -On eliquis.  HLD -Continue statin.   DVT prophylaxis: eliquis Code Status: DNR Family Communication: patient only Disposition Plan: home when ready, likely 24-48 hours  Consultants:   none  Procedures:   non3  Antimicrobials:   Rocephin  azithromycin    Subjective: Feels improved, still with a left earache and some sinus drainage and cough.  Objective: Vitals:   04/04/16 2333 04/05/16 0628 04/05/16 0712 04/05/16 0732  BP: 108/67 131/63  119/79  Pulse: (!) 108 (!) 127  (!) 105  Resp: 20 20  20   Temp: 98.3 F (36.8 C) 98.6 F (37 C)  99.1 F (37.3 C)  TempSrc: Oral Oral  Oral  SpO2: 93% 93% 91% 95%  Weight: 91.4 kg (201 lb 8 oz)     Height: 5\' 9"  (1.753 m)       Intake/Output Summary (Last 24 hours) at 04/05/16 1143 Last data filed at 04/05/16 0553  Gross per 24 hour  Intake            392.5 ml  Output                0 ml  Net            392.5 ml   Filed Weights   04/04/16 2012 04/04/16 2333  Weight: 93 kg (205 lb) 91.4 kg (201 lb 8 oz)    Examination:  General exam: Alert, awake, oriented x 3 Respiratory system: Clear to auscultation. Respiratory effort normal. Cardiovascular system:RRR. No murmurs, rubs,  gallops. Gastrointestinal system: Abdomen is nondistended, soft and nontender. No organomegaly or masses felt. Normal bowel sounds heard. Central nervous system: Alert and oriented. No focal neurological deficits. Extremities: No C/C/E, +pedal pulses Skin: No rashes, lesions or ulcers Psychiatry: Judgement and insight appear normal. Mood & affect appropriate.     Data Reviewed: I have personally reviewed following labs and imaging studies  CBC:  Recent Labs Lab 04/04/16 2018 04/05/16 0456  WBC 14.7* 14.5*  NEUTROABS 12.7* 12.4*  HGB 13.3 11.5*  HCT 38.3* 33.8*  MCV 90.3 91.4  PLT 198 [id number]   Basic Metabolic Panel:  Recent Labs Lab 04/04/16 2018 04/05/16 0456  NA 131* 132*  K 3.8 3.5  CL 96* 99*  CO2 25 25  GLUCOSE 148* 156*  BUN 18 16  CREATININE 1.29* 1.14  CALCIUM 8.6* 8.0*   GFR: Estimated Creatinine Clearance: 64.5 mL/min (by C-G formula based on SCr of 1.14 mg/dL). Liver Function Tests:  Recent Labs Lab 04/04/16 2018  AST 17  ALT 17  ALKPHOS 54  BILITOT 1.1  PROT 7.1  ALBUMIN 3.1*   No results for input(s): LIPASE, AMYLASE in the last 168 hours. No results for input(s): AMMONIA in the last 168 hours. Coagulation  Profile: No results for input(s): INR, PROTIME in the last 168 hours. Cardiac Enzymes:  Recent Labs Lab 04/04/16 2018  TROPONINI <0.03   BNP (last 3 results) No results for input(s): PROBNP in the last 8760 hours. HbA1C: No results for input(s): HGBA1C in the last 72 hours. CBG: No results for input(s): GLUCAP in the last 168 hours. Lipid Profile: No results for input(s): CHOL, HDL, LDLCALC, TRIG, CHOLHDL, LDLDIRECT in the last 72 hours. Thyroid Function Tests: No results for input(s): TSH, T4TOTAL, FREET4, T3FREE, THYROIDAB in the last 72 hours. Anemia Panel: No results for input(s): VITAMINB12, FOLATE, FERRITIN, TIBC, IRON, RETICCTPCT in the last 72 hours. Urine analysis:    Component Value Date/Time   COLORURINE YELLOW  01/07/2008 [place] 01/07/2008 1321   LABSPEC 1.025 01/07/2008 1321   PHURINE 6.0 01/07/2008 1321   GLUCOSEU NEGATIVE 01/07/2008 1321   HGBUR SMALL (A) 01/07/2008 1321   BILIRUBINUR NEGATIVE 01/07/2008 1321   KETONESUR NEGATIVE 01/07/2008 1321   PROTEINUR NEGATIVE 01/07/2008 1321   UROBILINOGEN 0.2 01/07/2008 1321   NITRITE NEGATIVE 01/07/2008 1321   LEUKOCYTESUR NEGATIVE 01/07/2008 1321   Sepsis Labs: @LABRCNTIP (procalcitonin:4,lacticidven:4)  ) Recent Results (from the past 240 hour(s))  Culture, blood (routine x 2) Call MD if unable to obtain prior to antibiotics being given     Status: None (Preliminary result)   Collection Time: 04/04/16 11:04 PM  Result Value Ref Range Status   Specimen Description LEFT ANTECUBITAL  Final   Special Requests BOTTLES DRAWN AEROBIC AND ANAEROBIC 6CC EACH  Final   Culture NO GROWTH < 12 HOURS  Final   Report Status PENDING  Incomplete  Culture, blood (routine x 2) Call MD if unable to obtain prior to antibiotics being given     Status: None (Preliminary result)   Collection Time: 04/04/16 11:12 PM  Result Value Ref Range Status   Specimen Description BLOOD LEFT HAND  Final   Special Requests BOTTLES DRAWN AEROBIC AND ANAEROBIC [place]  Final   Culture NO GROWTH < 12 HOURS  Final   Report Status PENDING  Incomplete         Radiology Studies: Dg Chest 2 View  Result Date: 04/04/2016 CLINICAL DATA:  Initial evaluation for acute shortness of breath, cough, congestion. EXAM: CHEST  2 VIEW COMPARISON:  Prior radiograph from 11/21/2014. FINDINGS: Cardiomegaly is stable from prior. Mediastinal silhouette within normal limits. Lungs are mildly hypoinflated. There is patchy parenchymal opacity within the left upper lobe, concerning for possible pneumonia. Some of this opacity is somewhat more wedge-shaped, and likely reflects superimposed atelectasis. Additional patchy opacity at the left lung base may reflect additional infiltrate  or atelectasis. Small left pleural effusion. Right lung is clear. No pulmonary edema. No pneumothorax. No acute osseous abnormality. IMPRESSION: 1. Patchy left upper lobe opacity, concerning for possible pneumonia given the history of cough and congestion. Radiographic follow-up to resolution is recommended as an underlying mass is not excluded. 2. Additional left basilar opacity, which may reflect additional infiltrate and/or atelectasis. 3. Small left pleural effusion. 4. Stable cardiomegaly. Electronically Signed   By: Jeannine Boga M.D.   On: 04/04/2016 21:04        Scheduled Meds: . apixaban  5 mg Oral BID  . aspirin EC  81 mg Oral Daily  . azithromycin  500 mg Oral Q24H  . cefTRIAXone (ROCEPHIN)  IV  1 g Intravenous Q24H  . ciprofloxacin-dexamethasone  4 drop Both Ears BID  . diltiazem  240 mg  Oral QHS  . fluticasone  2 spray Each Nare QPM  . ipratropium-albuterol  3 mL Nebulization Q6H  . loratadine  10 mg Oral Daily  . metoprolol succinate  12.5 mg Oral Daily  . pravastatin  20 mg Oral q1800  . traZODone  50 mg Oral QHS  . vitamin C  1,000 mg Oral Daily   Continuous Infusions: . lactated ringers 75 mL/hr at 04/05/16 0039     LOS: 1 day    Time spent: 25 minutes. Greater than 50% of this time was spent in direct contact with the patient coordinating care.     Lelon Frohlich, MD [facility] Pager [phone]  If 7PM-7AM, please contact night-coverage [email or web address] Password [email or web address] 04/05/2016, 11:43 AM

## 2016-04-05 NOTE — Progress Notes (Signed)
[place] for Renal Adjustment of ABX if needed ABX:  Rocephin and Zithromax PO  Allergies  Allergen Reactions  . Statins     Muscle aches  . Xarelto [Rivaroxaban]     Developed redness in both legs   Patient Measurements: Height: 5\' 9"  (175.3 cm) Weight: 201 lb 8 oz (91.4 kg) IBW/kg (Calculated) : 70.7  Vital Signs: Temp: 99.1 F (37.3 C) (11/06 0732) Temp Source: Oral (11/06 0732) BP: 119/79 (11/06 0732) Pulse Rate: 105 (11/06 0732)  Labs:  Recent Labs  04/04/16 2018 04/05/16 0456  WBC 14.7* 14.5*  HGB 13.3 11.5*  PLT 198 173  CREATININE 1.29* 1.14   No results for input(s): VANCOTROUGH, VANCOPEAK, VANCORANDOM, GENTTROUGH, GENTPEAK, GENTRANDOM, TOBRATROUGH, TOBRAPEAK, TOBRARND, AMIKACINPEAK, AMIKACINTROU, AMIKACIN in the last 72 hours.   Medical History: Past Medical History:  Diagnosis Date  . Aneurysm ([place])   . Arthritis   . Degenerative joint disease   . History of blood transfusion 12/2007   "bleeding for Warfarin"  . Hyperlipidemia   . Hypertension    Admitted for chest pain and 06/2011-neg stress Myoview,mod. LVH with nl EF on echo; TSH of 3.75  . Meniere's disease   . Nephrolithiasis   . 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 Permanent atrial fibrillation ([place])    a. s/p [place]  . Renal hemorrhage, right 2009   2009 while anticoagulated with Coumadin; treated with the renal artery embolization   Assessment: 75 started on Rocephin and Zithromax.  No renal adjustment needed.  Estimated Creatinine Clearance: 64.5 mL/min (by C-G formula based on SCr of 1.14 mg/dL).  Plan: Continue current Rx Monitor labs, progress  Ena Dawley, [facility] 04/05/2016

## 2016-04-05 NOTE — Care Management Note (Signed)
Case Management Note  Patient Details  Name: [name] MRN: [id number] Date of Birth: December 21, 1940  Subjective/Objective:                  Pt admitted with sepsis. He is from home, lives with his wife and is ind with ADL's. He has PCP, he drives himself to appointments and has no difficulty affording medications. He has no HH services or DME needs. He does not wear supplemental oxygen PTA. He plans to return home with self care at DC.   Action/Plan: No CM needs anticipated.   Expected Discharge Date:    04/07/2016              Expected Discharge Plan:  Home/Self Care  In-House Referral:  NA  Discharge planning Services  CM Consult  Post Acute Care Choice:  NA Choice offered to:  NA  Status of Service:  Completed, signed off  Sherald Barge, RN 04/05/2016, 10:37 AM

## 2016-04-06 DIAGNOSIS — A419 Sepsis, unspecified organism: Secondary | ICD-10-CM | POA: Diagnosis not present

## 2016-04-06 DIAGNOSIS — N179 Acute kidney failure, unspecified: Secondary | ICD-10-CM | POA: Diagnosis not present

## 2016-04-06 DIAGNOSIS — I482 Chronic atrial fibrillation: Secondary | ICD-10-CM | POA: Diagnosis not present

## 2016-04-06 DIAGNOSIS — J189 Pneumonia, unspecified organism: Secondary | ICD-10-CM | POA: Diagnosis not present

## 2016-04-06 LAB — BASIC METABOLIC PANEL
Anion gap: 7 (ref 5–15)
BUN: 17 mg/dL (ref 6–20)
CO2: 25 mmol/L (ref 22–32)
Calcium: 8 mg/dL — ABNORMAL LOW (ref 8.9–10.3)
Chloride: 102 mmol/L (ref 101–111)
Creatinine, Ser: 0.9 mg/dL (ref 0.61–1.24)
GFR calc Af Amer: >60 mL/min (ref >60)
GFR calc non Af Amer: >60 mL/min (ref >60)
Glucose, Bld: 128 mg/dL — ABNORMAL HIGH (ref 65–99)
Potassium: 3.4 mmol/L — ABNORMAL LOW (ref 3.5–5.1)
Sodium: 134 mmol/L — ABNORMAL LOW (ref 135–145)

## 2016-04-06 LAB — CBC
HCT: 32.9 % — ABNORMAL LOW (ref 39.0–52.0)
Hemoglobin: 10.9 g/dL — ABNORMAL LOW (ref 13.0–17.0)
MCH: 30.4 pg (ref 26.0–34.0)
MCHC: 33.1 g/dL (ref 30.0–36.0)
MCV: 91.9 fL (ref 78.0–100.0)
Platelets: 187 10*3/uL (ref 150–400)
RBC: 3.58 MIL/uL — ABNORMAL LOW (ref 4.22–5.81)
RDW: 14.3 % (ref 11.5–15.5)
WBC: 10.7 10*3/uL — ABNORMAL HIGH (ref 4.0–10.5)

## 2016-04-06 LAB — STREP PNEUMONIAE URINARY ANTIGEN: Strep Pneumo Urinary Antigen: NEGATIVE

## 2016-04-06 MED ORDER — IPRATROPIUM-ALBUTEROL 0.5-2.5 (3) MG/3ML IN SOLN
3.0000 mL | Freq: Three times a day (TID) | RESPIRATORY_TRACT | Status: DC
  Administered 2016-04-07: 3 mL via RESPIRATORY_TRACT
  Filled 2016-04-06: qty 3

## 2016-04-06 NOTE — Progress Notes (Signed)
PROGRESS NOTE    [name]  [id number] DOB: [birth year] DOA: 04/04/2016 PCP: [name], MD     Brief Narrative:  75 y/o man admitted from home on 11/5 due to SOB, found to have a CAP and admitted for management.   Assessment & Plan:   Principal Problem:   Sepsis ([place]) Active Problems:   Hypertension   Hyperlipidemia   Chronic atrial fibrillation (HCC)   CAP (community acquired pneumonia)   Hyponatremia   AKI (acute kidney injury) ([place])   Hyperglycemia   Sepsis -Due to CAP. -Sepsis parameters improved.  CAP -Continue rocephin/azithro. -Cx data pending. -Symptomatically improved but still with significant cough and SOB even at rest.  ARF -Resolved with IVF.  Hyponatremia -Improved with IVF.  HTN -Well controlled.  A Fib -Fair rate control. -On eliquis.  HLD -Continue statin.   DVT prophylaxis: eliquis Code Status: DNR Family Communication: wife at bedside updated on plan of care and all questions answered Disposition Plan: home when ready, likely 24-48 hours  Consultants:   none  Procedures:   non3  Antimicrobials:   Rocephin  azithromycin    Subjective: Feels improved, still with a left earache and some sinus drainage and cough.  Objective: Vitals:   04/05/16 2147 04/06/16 0153 04/06/16 0623 04/06/16 0831  BP: 115/76  113/77   Pulse: (!) 111  89   Resp: (!) 21  20   Temp: 98.9 F (37.2 C)  97.6 F (36.4 C)   TempSrc: Oral  Oral   SpO2: 94% 96% 96% 96%  Weight:      Height:        Intake/Output Summary (Last 24 hours) at 04/06/16 1341 Last data filed at 04/05/16 1941  Gross per 24 hour  Intake           808.75 ml  Output                0 ml  Net           808.75 ml   Filed Weights   04/04/16 2012 04/04/16 2333  Weight: 93 kg (205 lb) 91.4 kg (201 lb 8 oz)    Examination:  General exam: Alert, awake, oriented x 3 Respiratory system: coarse bilateral BS Cardiovascular system:RRR. No murmurs, rubs,  gallops. Gastrointestinal system: Abdomen is nondistended, soft and nontender. No organomegaly or masses felt. Normal bowel sounds heard. Central nervous system: Alert and oriented. No focal neurological deficits. Extremities: No C/C/E, +pedal pulses Skin: No rashes, lesions or ulcers Psychiatry: Judgement and insight appear normal. Mood & affect appropriate.     Data Reviewed: I have personally reviewed following labs and imaging studies  CBC:  Recent Labs Lab 04/04/16 2018 04/05/16 0456 04/06/16 0625  WBC 14.7* 14.5* 10.7*  NEUTROABS 12.7* 12.4*  --   HGB 13.3 11.5* 10.9*  HCT 38.3* 33.8* 32.9*  MCV 90.3 91.4 91.9  PLT 198 173 [id number]   Basic Metabolic Panel:  Recent Labs Lab 04/04/16 2018 04/05/16 0456 04/06/16 0625  NA 131* 132* 134*  K 3.8 3.5 3.4*  CL 96* 99* 102  CO2 25 25 25   GLUCOSE 148* 156* 128*  BUN 18 16 17   CREATININE 1.29* 1.14 0.90  CALCIUM 8.6* 8.0* 8.0*   GFR: Estimated Creatinine Clearance: 81.7 mL/min (by C-G formula based on SCr of 0.9 mg/dL). Liver Function Tests:  Recent Labs Lab 04/04/16 2018  AST 17  ALT 17  ALKPHOS 54  BILITOT 1.1  PROT 7.1  ALBUMIN 3.1*  No results for input(s): LIPASE, AMYLASE in the last 168 hours. No results for input(s): AMMONIA in the last 168 hours. Coagulation Profile: No results for input(s): INR, PROTIME in the last 168 hours. Cardiac Enzymes:  Recent Labs Lab 04/04/16 2018  TROPONINI <0.03   BNP (last 3 results) No results for input(s): PROBNP in the last 8760 hours. HbA1C: No results for input(s): HGBA1C in the last 72 hours. CBG: No results for input(s): GLUCAP in the last 168 hours. Lipid Profile: No results for input(s): CHOL, HDL, LDLCALC, TRIG, CHOLHDL, LDLDIRECT in the last 72 hours. Thyroid Function Tests: No results for input(s): TSH, T4TOTAL, FREET4, T3FREE, THYROIDAB in the last 72 hours. Anemia Panel: No results for input(s): VITAMINB12, FOLATE, FERRITIN, TIBC, IRON, RETICCTPCT  in the last 72 hours. Urine analysis:    Component Value Date/Time   COLORURINE YELLOW 01/07/2008 1321   APPEARANCEUR CLEAR 01/07/2008 1321   LABSPEC 1.025 01/07/2008 1321   PHURINE 6.0 01/07/2008 1321   GLUCOSEU NEGATIVE 01/07/2008 1321   HGBUR SMALL (A) 01/07/2008 1321   BILIRUBINUR NEGATIVE 01/07/2008 1321   KETONESUR NEGATIVE 01/07/2008 1321   PROTEINUR NEGATIVE 01/07/2008 1321   UROBILINOGEN 0.2 01/07/2008 1321   NITRITE NEGATIVE 01/07/2008 1321   LEUKOCYTESUR NEGATIVE 01/07/2008 1321   Sepsis Labs: @LABRCNTIP (procalcitonin:4,lacticidven:4)  ) Recent Results (from the past 240 hour(s))  Culture, blood (routine x 2) Call MD if unable to obtain prior to antibiotics being given     Status: None (Preliminary result)   Collection Time: 04/04/16 11:04 PM  Result Value Ref Range Status   Specimen Description LEFT ANTECUBITAL  Final   Special Requests BOTTLES DRAWN AEROBIC AND ANAEROBIC 6CC EACH  Final   Culture NO GROWTH 2 DAYS  Final   Report Status PENDING  Incomplete  Culture, blood (routine x 2) Call MD if unable to obtain prior to antibiotics being given     Status: None (Preliminary result)   Collection Time: 04/04/16 11:12 PM  Result Value Ref Range Status   Specimen Description BLOOD LEFT HAND  Final   Special Requests BOTTLES DRAWN AEROBIC AND ANAEROBIC 6CC EACH  Final   Culture NO GROWTH 2 DAYS  Final   Report Status PENDING  Incomplete  Culture, sputum-assessment     Status: None   Collection Time: 04/05/16  1:11 PM  Result Value Ref Range Status   Specimen Description EXPECTORATED SPUTUM  Final   Special Requests Normal  Final   Sputum evaluation   Final    THIS SPECIMEN IS ACCEPTABLE. RESPIRATORY CULTURE REPORT TO FOLLOW. Performed at [facility]    Report Status 04/05/2016 FINAL  Final  Culture, respiratory (NON-Expectorated)     Status: None (Preliminary result)   Collection Time: 04/05/16  1:11 PM  Result Value Ref Range Status   Specimen  Description EXPECTORATED SPUTUM  Final   Special Requests NONE  Final   Gram Stain   Final    ABUNDANT WBC PRESENT, PREDOMINANTLY PMN FEW GRAM POSITIVE COCCI IN PAIRS FEW GRAM VARIABLE ROD FEW SQUAMOUS EPITHELIAL CELLS PRESENT    Culture   Final    CULTURE REINCUBATED FOR BETTER GROWTH Performed at [facility]    Report Status PENDING  Incomplete         Radiology Studies: Dg Chest 2 View  Result Date: 04/04/2016 CLINICAL DATA:  Initial evaluation for acute shortness of breath, cough, congestion. EXAM: CHEST  2 VIEW COMPARISON:  Prior radiograph from 11/21/2014. FINDINGS: Cardiomegaly is stable from prior. Mediastinal  silhouette within normal limits. Lungs are mildly hypoinflated. There is patchy parenchymal opacity within the left upper lobe, concerning for possible pneumonia. Some of this opacity is somewhat more wedge-shaped, and likely reflects superimposed atelectasis. Additional patchy opacity at the left lung base may reflect additional infiltrate or atelectasis. Small left pleural effusion. Right lung is clear. No pulmonary edema. No pneumothorax. No acute osseous abnormality. IMPRESSION: 1. Patchy left upper lobe opacity, concerning for possible pneumonia given the history of cough and congestion. Radiographic follow-up to resolution is recommended as an underlying mass is not excluded. 2. Additional left basilar opacity, which may reflect additional infiltrate and/or atelectasis. 3. Small left pleural effusion. 4. Stable cardiomegaly. Electronically Signed   By: Jeannine Boga M.D.   On: 04/04/2016 21:04        Scheduled Meds: . apixaban  5 mg Oral BID  . aspirin EC  81 mg Oral Daily  . azithromycin  500 mg Oral Q24H  . cefTRIAXone (ROCEPHIN)  IV  1 g Intravenous Q24H  . ciprofloxacin-dexamethasone  4 drop Both Ears BID  . diltiazem  240 mg Oral QHS  . fluticasone  2 spray Each Nare QPM  . ipratropium-albuterol  3 mL Nebulization Q6H  . loratadine  10 mg  Oral Daily  . metoprolol succinate  12.5 mg Oral Daily  . pravastatin  20 mg Oral q1800  . traZODone  50 mg Oral QHS  . vitamin C  1,000 mg Oral Daily   Continuous Infusions: . lactated ringers 75 mL/hr at 04/06/16 0350     LOS: 2 days    Time spent: 25 minutes. Greater than 50% of this time was spent in direct contact with the patient coordinating care.     Lelon Frohlich, MD [facility] Pager [phone]  If 7PM-7AM, please contact night-coverage [email or web address] Password [email or web address] 04/06/2016, 1:41 PM

## 2016-04-07 ENCOUNTER — Telehealth: Payer: Self-pay | Admitting: Internal Medicine

## 2016-04-07 DIAGNOSIS — I482 Chronic atrial fibrillation: Secondary | ICD-10-CM

## 2016-04-07 DIAGNOSIS — N179 Acute kidney failure, unspecified: Secondary | ICD-10-CM | POA: Diagnosis not present

## 2016-04-07 DIAGNOSIS — J181 Lobar pneumonia, unspecified organism: Secondary | ICD-10-CM

## 2016-04-07 DIAGNOSIS — A419 Sepsis, unspecified organism: Secondary | ICD-10-CM | POA: Diagnosis not present

## 2016-04-07 DIAGNOSIS — E871 Hypo-osmolality and hyponatremia: Secondary | ICD-10-CM

## 2016-04-07 LAB — CBC
HCT: 32.5 % — ABNORMAL LOW (ref 39.0–52.0)
Hemoglobin: 11.1 g/dL — ABNORMAL LOW (ref 13.0–17.0)
MCH: 31.1 pg (ref 26.0–34.0)
MCHC: 34.2 g/dL (ref 30.0–36.0)
MCV: 91 fL (ref 78.0–100.0)
Platelets: 209 10*3/uL (ref 150–400)
RBC: 3.57 MIL/uL — ABNORMAL LOW (ref 4.22–5.81)
RDW: 14.1 % (ref 11.5–15.5)
WBC: 9.2 10*3/uL (ref 4.0–10.5)

## 2016-04-07 LAB — BASIC METABOLIC PANEL
Anion gap: 8 (ref 5–15)
BUN: 13 mg/dL (ref 6–20)
CO2: 27 mmol/L (ref 22–32)
Calcium: 8.3 mg/dL — ABNORMAL LOW (ref 8.9–10.3)
Chloride: 99 mmol/L — ABNORMAL LOW (ref 101–111)
Creatinine, Ser: 0.84 mg/dL (ref 0.61–1.24)
GFR calc Af Amer: >60 mL/min (ref >60)
GFR calc non Af Amer: >60 mL/min (ref >60)
Glucose, Bld: 139 mg/dL — ABNORMAL HIGH (ref 65–99)
Potassium: 3.4 mmol/L — ABNORMAL LOW (ref 3.5–5.1)
Sodium: 134 mmol/L — ABNORMAL LOW (ref 135–145)

## 2016-04-07 MED ORDER — GUAIFENESIN-DM 100-10 MG/5ML PO SYRP: 5.0000 mL | ORAL_SOLUTION | ORAL | 0 refills | Status: DC | PRN

## 2016-04-07 MED ORDER — POTASSIUM CHLORIDE CRYS ER 20 MEQ PO TBCR
40.0000 meq | EXTENDED_RELEASE_TABLET | Freq: Once | ORAL | Status: AC
  Administered 2016-04-07: 40 meq via ORAL
  Filled 2016-04-07: qty 2

## 2016-04-07 MED ORDER — LEVOFLOXACIN 750 MG PO TABS
750.0000 mg | ORAL_TABLET | Freq: Every day | ORAL | 0 refills | Status: AC
Start: 2016-04-07 — End: 2016-04-10

## 2016-04-07 NOTE — Discharge Instructions (Signed)

## 2016-04-07 NOTE — Progress Notes (Signed)
[name] discharged Home per MD order.  Discharge instructions reviewed and discussed with the patient, all questions and concerns answered. Copy of instructions and scripts given to patient.    Medication List    TAKE these medications   acetaminophen 500 MG tablet Commonly known as:  TYLENOL Take 1,000 mg by mouth every 8 (eight) hours as needed (pain).   apixaban 5 MG Tabs tablet Commonly known as:  ELIQUIS Take 1 tablet (5 mg total) by mouth 2 (two) times daily.   aspirin 81 MG tablet Take 81 mg by mouth daily.   cetirizine 10 MG tablet Commonly known as:  ZYRTEC Take 10 mg by mouth at bedtime.   CIPRODEX otic suspension Generic drug:  ciprofloxacin-dexamethasone Place 4 drops in ear(s) 2 (two) times daily.   diltiazem 240 MG 24 hr capsule Commonly known as:  CARDIZEM CD Take 240 mg by mouth at bedtime.   fluticasone 50 MCG/ACT nasal spray Commonly known as:  FLONASE Place 2 sprays into both nostrils every evening.   guaiFENesin-dextromethorphan 100-10 MG/5ML syrup Commonly known as:  ROBITUSSIN DM Take 5 mLs by mouth every 4 (four) hours as needed for cough.   levofloxacin 750 MG tablet Commonly known as:  LEVAQUIN Take 1 tablet (750 mg total) by mouth daily.   meclizine 25 MG tablet Commonly known as:  ANTIVERT Take 25 mg by mouth 2 (two) times daily as needed. For dizziness   metoprolol succinate 25 MG 24 hr tablet Commonly known as:  TOPROL-XL Take 0.5 tablets (12.5 mg total) by mouth daily.   multivitamin tablet Take 1 tablet by mouth daily.   pravastatin 20 MG tablet Commonly known as:  PRAVACHOL TAKE ONE TABLET BY MOUTH ONCE EVERY EVENING.   traZODone 50 MG tablet Commonly known as:  DESYREL Take 50 mg by mouth at bedtime.   triamterene-hydrochlorothiazide 37.5-25 MG tablet Commonly known as:  MAXZIDE-25 Take 1 tablet by mouth daily.   vitamin C 1000 MG tablet Take 1,000 mg by mouth daily.   Vitamin D (Cholecalciferol) 1000 units  Tabs Take 1,000 Units by mouth at bedtime.       Patients skin is clean, dry and intact, no evidence of skin break down. IV site discontinued and catheter remains intact. Site without signs and symptoms of complications. Dressing and pressure applied.  Patient escorted to car by NT in a wheelchair,  no distress noted upon discharge.  [name] 04/07/2016 12:01 PM

## 2016-04-07 NOTE — Discharge Summary (Signed)
Physician Discharge Summary  [name] [id number] DOB: [birth year] DOA: 04/04/2016  PCP: [name], MD  Admit date: 04/04/2016 Discharge date: 04/07/2016  Admitted From: Home Disposition: Home  Recommendations for Outpatient Follow-up:  1. Follow up with PCP in 1-2 weeks.Completes 5 day antibiotic course on 11/11. 2. Please obtain follow-up chest x-ray in 3-4 weeks to ensure resolution.   Home Health: None Equipment/Devices: None   Discharge Condition: Stable CODE STATUS: DO NOT RESUSCITATE Diet recommendation: Heart Healthy     Discharge Diagnoses:  Principal Problem:   Sepsis ([place])   Active Problems:   CAP (community acquired pneumonia)   Hypertension   Hyperlipidemia   Chronic atrial fibrillation (HCC)   Hyponatremia   AKI (acute kidney injury) ([place])   Hyperglycemia  Brief narrative/history of present illness 75 year old male with A. fib on anticoagulation, (recent watchman procedure), AAA, hyperlipidemia who presented with acute onset of shortness of breath which was associated with productive cough for the past 2 days. He was having URI symptoms with ear infection and was using ear drops for the past 2 days. He reported poor appetite and subjective fever with episodes of watery diarrhea for the past 2 days. In the ED he was tachypneic, tachycardic with elevated white count and elevated lactic acid meeting criteria for sepsis. Chest x-ray showed left upper lobe CT concerning for pneumonia along with left basilar opacity and small left pleural effusion. Patient admitted to hospitalist service. Sepsis pathway initiated on admission.  Hospital course Sepsis [facility]) Secondary to multilobar left-sided pneumonia. Placed on empiric Rocephin and azithromycin. Supportive care with Tylenol, antitussives and when necessary nebs. Sepsis resolved and patient much improved clinically. Cultures have been negative. I will discharge him on oral Levaquin to complete 5 day course of  antibiotics. He should follow-up with his PCP in 1 week and should have follow-up chest x-ray done in about 3-4 weeks to ensure resolution.  Lobar pneumonia Plan as outlined above.  Acute kidney injury Likely prerenal. Resolved with IV fluids.  Hyponatremia Secondary to dehydration. Resolved with IV fluids.  Hypokalemia Replenished.  A. fib Rate controlled. Continue eliquis. Follows with Dr. Rayann Heman.  Hyperlipidemia Continue statin.   Family communication: Wife at bedside.  Consults: None  Disposition: Home     Discharge Instructions     Medication List    TAKE these medications   acetaminophen 500 MG tablet Commonly known as:  TYLENOL Take 1,000 mg by mouth every 8 (eight) hours as needed (pain).   apixaban 5 MG Tabs tablet Commonly known as:  ELIQUIS Take 1 tablet (5 mg total) by mouth 2 (two) times daily.   aspirin 81 MG tablet Take 81 mg by mouth daily.   cetirizine 10 MG tablet Commonly known as:  ZYRTEC Take 10 mg by mouth at bedtime.   CIPRODEX otic suspension Generic drug:  ciprofloxacin-dexamethasone Place 4 drops in ear(s) 2 (two) times daily.   diltiazem 240 MG 24 hr capsule Commonly known as:  CARDIZEM CD Take 240 mg by mouth at bedtime.   fluticasone 50 MCG/ACT nasal spray Commonly known as:  FLONASE Place 2 sprays into both nostrils every evening.   guaiFENesin-dextromethorphan 100-10 MG/5ML syrup Commonly known as:  ROBITUSSIN DM Take 5 mLs by mouth every 4 (four) hours as needed for cough.   levofloxacin 750 MG tablet Commonly known as:  LEVAQUIN Take 1 tablet (750 mg total) by mouth daily.   meclizine 25 MG tablet Commonly known as:  ANTIVERT Take 25 mg by mouth 2 (  two) times daily as needed. For dizziness   metoprolol succinate 25 MG 24 hr tablet Commonly known as:  TOPROL-XL Take 0.5 tablets (12.5 mg total) by mouth daily.   multivitamin tablet Take 1 tablet by mouth daily.   pravastatin 20 MG tablet Commonly known  as:  PRAVACHOL TAKE ONE TABLET BY MOUTH ONCE EVERY EVENING.   traZODone 50 MG tablet Commonly known as:  DESYREL Take 50 mg by mouth at bedtime.   triamterene-hydrochlorothiazide 37.5-25 MG tablet Commonly known as:  MAXZIDE-25 Take 1 tablet by mouth daily.   vitamin C 1000 MG tablet Take 1,000 mg by mouth daily.   Vitamin D (Cholecalciferol) 1000 units Tabs Take 1,000 Units by mouth at bedtime.       Allergies  Allergen Reactions  . Statins     Muscle aches  . Xarelto [Rivaroxaban]     Developed redness in both legs      Procedures/Studies: Dg Chest 2 View  Result Date: 04/04/2016 CLINICAL DATA:  Initial evaluation for acute shortness of breath, cough, congestion. EXAM: CHEST  2 VIEW COMPARISON:  Prior radiograph from 11/21/2014. FINDINGS: Cardiomegaly is stable from prior. Mediastinal silhouette within normal limits. Lungs are mildly hypoinflated. There is patchy parenchymal opacity within the left upper lobe, concerning for possible pneumonia. Some of this opacity is somewhat more wedge-shaped, and likely reflects superimposed atelectasis. Additional patchy opacity at the left lung base may reflect additional infiltrate or atelectasis. Small left pleural effusion. Right lung is clear. No pulmonary edema. No pneumothorax. No acute osseous abnormality. IMPRESSION: 1. Patchy left upper lobe opacity, concerning for possible pneumonia given the history of cough and congestion. Radiographic follow-up to resolution is recommended as an underlying mass is not excluded. 2. Additional left basilar opacity, which may reflect additional infiltrate and/or atelectasis. 3. Small left pleural effusion. 4. Stable cardiomegaly. Electronically Signed   By: Jeannine Boga M.D.   On: 04/04/2016 21:04     Subjective: Feels much better. Denies further shortness of breath. Still has some cough with whitish phlegm.  Discharge Exam: Vitals:   04/06/16 2034 04/07/16 0500  BP: 116/79 126/78   Pulse: 98 (!) 101  Resp: 20 20  Temp: 98.4 F (36.9 C) 98.2 F (36.8 C)   Vitals:   04/06/16 1919 04/06/16 2034 04/07/16 0500 04/07/16 0729  BP:  116/79 126/78   Pulse:  98 (!) 101   Resp:  20 20   Temp:  98.4 F (36.9 C) 98.2 F (36.8 C)   TempSrc:  Oral Oral   SpO2: 93% 94% 93% 95%  Weight:      Height:        General: Elderly male not in distress HEENT: Moist mucosa, supple neck Cardiovascular: And S2 irregular, no murmurs rubs or gallop Chest: CTA bilaterally, no wheezing, no rhonchi Abdominal: Soft, NT, ND,  musculoskeletal: Warm, no edema    The results of significant diagnostics from this hospitalization (including imaging, microbiology, ancillary and laboratory) are listed below for reference.     Microbiology: Recent Results (from the past 240 hour(s))  Culture, blood (routine x 2) Call MD if unable to obtain prior to antibiotics being given     Status: None (Preliminary result)   Collection Time: 04/04/16 11:04 PM  Result Value Ref Range Status   Specimen Description LEFT ANTECUBITAL  Final   Special Requests BOTTLES DRAWN AEROBIC AND ANAEROBIC Marty  Final   Culture NO GROWTH 3 DAYS  Final   Report Status PENDING  Incomplete  Culture, blood (routine x 2) Call MD if unable to obtain prior to antibiotics being given     Status: None (Preliminary result)   Collection Time: 04/04/16 11:12 PM  Result Value Ref Range Status   Specimen Description BLOOD LEFT HAND  Final   Special Requests BOTTLES DRAWN AEROBIC AND ANAEROBIC 6CC EACH  Final   Culture NO GROWTH 3 DAYS  Final   Report Status PENDING  Incomplete  Culture, sputum-assessment     Status: None   Collection Time: 04/05/16  1:11 PM  Result Value Ref Range Status   Specimen Description EXPECTORATED SPUTUM  Final   Special Requests Normal  Final   Sputum evaluation   Final    THIS SPECIMEN IS ACCEPTABLE. RESPIRATORY CULTURE REPORT TO FOLLOW. Performed at [facility]    Report Status  04/05/2016 FINAL  Final  Culture, respiratory (NON-Expectorated)     Status: None (Preliminary result)   Collection Time: 04/05/16  1:11 PM  Result Value Ref Range Status   Specimen Description EXPECTORATED SPUTUM  Final   Special Requests NONE  Final   Gram Stain   Final    ABUNDANT WBC PRESENT, PREDOMINANTLY PMN FEW GRAM POSITIVE COCCI IN PAIRS FEW GRAM VARIABLE ROD FEW SQUAMOUS EPITHELIAL CELLS PRESENT    Culture   Final    CULTURE REINCUBATED FOR BETTER GROWTH Performed at [facility]    Report Status PENDING  Incomplete     Labs: BNP (last 3 results)  Recent Labs  04/04/16 2018  BNP 123456*   Basic Metabolic Panel:  Recent Labs Lab 04/04/16 2018 04/05/16 0456 04/06/16 0625 04/07/16 0608  NA 131* 132* 134* 134*  K 3.8 3.5 3.4* 3.4*  CL 96* 99* 102 99*  CO2 25 25 25 27   GLUCOSE 148* 156* 128* 139*  BUN 18 16 17 13   CREATININE 1.29* 1.14 0.90 0.84  CALCIUM 8.6* 8.0* 8.0* 8.3*   Liver Function Tests:  Recent Labs Lab 04/04/16 2018  AST 17  ALT 17  ALKPHOS 54  BILITOT 1.1  PROT 7.1  ALBUMIN 3.1*   No results for input(s): LIPASE, AMYLASE in the last 168 hours. No results for input(s): AMMONIA in the last 168 hours. CBC:  Recent Labs Lab 04/04/16 2018 04/05/16 0456 04/06/16 0625 04/07/16 0608  WBC 14.7* 14.5* 10.7* 9.2  NEUTROABS 12.7* 12.4*  --   --   HGB 13.3 11.5* 10.9* 11.1*  HCT 38.3* 33.8* 32.9* 32.5*  MCV 90.3 91.4 91.9 91.0  PLT 198 173 187 209   Cardiac Enzymes:  Recent Labs Lab 04/04/16 2018  TROPONINI <0.03   BNP: Invalid input(s): POCBNP CBG: No results for input(s): GLUCAP in the last 168 hours. D-Dimer No results for input(s): DDIMER in the last 72 hours. Hgb A1c No results for input(s): HGBA1C in the last 72 hours. Lipid Profile No results for input(s): CHOL, HDL, LDLCALC, TRIG, CHOLHDL, LDLDIRECT in the last 72 hours. Thyroid function studies No results for input(s): TSH, T4TOTAL, T3FREE, THYROIDAB in the  last 72 hours.  Invalid input(s): FREET3 Anemia work up No results for input(s): VITAMINB12, FOLATE, FERRITIN, TIBC, IRON, RETICCTPCT in the last 72 hours. Urinalysis    Component Value Date/Time   COLORURINE YELLOW 01/07/2008 [place] 01/07/2008 1321   LABSPEC 1.025 01/07/2008 1321   PHURINE 6.0 01/07/2008 1321   GLUCOSEU NEGATIVE 01/07/2008 1321   HGBUR SMALL (A) 01/07/2008 1321   BILIRUBINUR NEGATIVE 01/07/2008 1321   [place] 01/07/2008 1321  PROTEINUR NEGATIVE 01/07/2008 1321   UROBILINOGEN 0.2 01/07/2008 1321   NITRITE NEGATIVE 01/07/2008 1321   LEUKOCYTESUR NEGATIVE 01/07/2008 1321   Sepsis Labs Invalid input(s): PROCALCITONIN,  WBC,  LACTICIDVEN Microbiology Recent Results (from the past 240 hour(s))  Culture, blood (routine x 2) Call MD if unable to obtain prior to antibiotics being given     Status: None (Preliminary result)   Collection Time: 04/04/16 11:04 PM  Result Value Ref Range Status   Specimen Description LEFT ANTECUBITAL  Final   Special Requests BOTTLES DRAWN AEROBIC AND ANAEROBIC 6CC EACH  Final   Culture NO GROWTH 3 DAYS  Final   Report Status PENDING  Incomplete  Culture, blood (routine x 2) Call MD if unable to obtain prior to antibiotics being given     Status: None (Preliminary result)   Collection Time: 04/04/16 11:12 PM  Result Value Ref Range Status   Specimen Description BLOOD LEFT HAND  Final   Special Requests BOTTLES DRAWN AEROBIC AND ANAEROBIC 6CC EACH  Final   Culture NO GROWTH 3 DAYS  Final   Report Status PENDING  Incomplete  Culture, sputum-assessment     Status: None   Collection Time: 04/05/16  1:11 PM  Result Value Ref Range Status   Specimen Description EXPECTORATED SPUTUM  Final   Special Requests Normal  Final   Sputum evaluation   Final    THIS SPECIMEN IS ACCEPTABLE. RESPIRATORY CULTURE REPORT TO FOLLOW. Performed at [facility]    Report Status 04/05/2016 FINAL  Final  Culture,  respiratory (NON-Expectorated)     Status: None (Preliminary result)   Collection Time: 04/05/16  1:11 PM  Result Value Ref Range Status   Specimen Description EXPECTORATED SPUTUM  Final   Special Requests NONE  Final   Gram Stain   Final    ABUNDANT WBC PRESENT, PREDOMINANTLY PMN FEW GRAM POSITIVE COCCI IN PAIRS FEW GRAM VARIABLE ROD FEW SQUAMOUS EPITHELIAL CELLS PRESENT    Culture   Final    CULTURE REINCUBATED FOR BETTER GROWTH Performed at [facility]    Report Status PENDING  Incomplete     Time coordinating discharge: Over 30 minutes  SIGNED:   Louellen Molder, MD  [facility] 04/07/2016, 10:23 AM Pager   If 7PM-7AM, please contact night-coverage [email or web address] Password [email or web address]

## 2016-04-07 NOTE — Telephone Encounter (Signed)
New message      Pt recently had watchman procedure.  He was released today from the hosp.  He had pneumonia.  Pt is scheduled for a TEE procedure on 04-30-16.  Will he still be able to have procedure because of pneumonia?

## 2016-04-08 NOTE — Telephone Encounter (Signed)
Spoke with patient's wife and let her know Amber's recommendations.  She will call back if has fever or cough and reschedule if needed.

## 2016-04-08 NOTE — Telephone Encounter (Signed)
If no cough or fever, should be ok for TEE.  Chanetta Marshall, NP 04/08/2016 9:07 AM

## 2016-04-09 LAB — CULTURE, BLOOD (ROUTINE X 2)
Culture: NO GROWTH
Culture: NO GROWTH

## 2016-04-10 LAB — CULTURE, RESPIRATORY

## 2016-04-10 LAB — CULTURE, RESPIRATORY W GRAM STAIN

## 2016-04-15 ENCOUNTER — Other Ambulatory Visit: Payer: Self-pay | Admitting: *Deleted

## 2016-04-15 DIAGNOSIS — J188 Other pneumonia, unspecified organism: Secondary | ICD-10-CM | POA: Diagnosis not present

## 2016-04-15 DIAGNOSIS — I714 Abdominal aortic aneurysm, without rupture, unspecified: Secondary | ICD-10-CM

## 2016-04-15 DIAGNOSIS — M545 Low back pain: Secondary | ICD-10-CM | POA: Diagnosis not present

## 2016-04-15 DIAGNOSIS — H608X9 Other otitis externa, unspecified ear: Secondary | ICD-10-CM | POA: Diagnosis not present

## 2016-04-15 DIAGNOSIS — Z09 Encounter for follow-up examination after completed treatment for conditions other than malignant neoplasm: Secondary | ICD-10-CM | POA: Diagnosis not present

## 2016-04-15 DIAGNOSIS — Z6828 Body mass index (BMI) 28.0-28.9, adult: Secondary | ICD-10-CM | POA: Diagnosis not present

## 2016-04-30 ENCOUNTER — Ambulatory Visit (HOSPITAL_COMMUNITY)
Admission: RE | Admit: 2016-04-30 | Discharge: 2016-04-30 | Disposition: A | Discharge status: Home / Self Care | Payer: PPO | Source: Ambulatory Visit | Attending: Cardiovascular Disease | Admitting: Cardiovascular Disease

## 2016-04-30 ENCOUNTER — Encounter (HOSPITAL_COMMUNITY): Payer: Self-pay | Admitting: *Deleted

## 2016-04-30 ENCOUNTER — Encounter (HOSPITAL_COMMUNITY)
Admission: RE | Disposition: A | Discharge status: Home / Self Care | Payer: Self-pay | Source: Ambulatory Visit | Attending: Cardiovascular Disease

## 2016-04-30 ENCOUNTER — Ambulatory Visit (HOSPITAL_BASED_OUTPATIENT_CLINIC_OR_DEPARTMENT_OTHER): Payer: PPO

## 2016-04-30 DIAGNOSIS — I482 Chronic atrial fibrillation: Secondary | ICD-10-CM | POA: Diagnosis not present

## 2016-04-30 DIAGNOSIS — I119 Hypertensive heart disease without heart failure: Secondary | ICD-10-CM | POA: Insufficient documentation

## 2016-04-30 DIAGNOSIS — H8109 Meniere's disease, unspecified ear: Secondary | ICD-10-CM | POA: Insufficient documentation

## 2016-04-30 DIAGNOSIS — Z7982 Long term (current) use of aspirin: Secondary | ICD-10-CM | POA: Insufficient documentation

## 2016-04-30 DIAGNOSIS — Z7901 Long term (current) use of anticoagulants: Secondary | ICD-10-CM | POA: Insufficient documentation

## 2016-04-30 DIAGNOSIS — I714 Abdominal aortic aneurysm, without rupture: Secondary | ICD-10-CM | POA: Diagnosis not present

## 2016-04-30 DIAGNOSIS — Z95818 Presence of other cardiac implants and grafts: Secondary | ICD-10-CM | POA: Diagnosis not present

## 2016-04-30 DIAGNOSIS — Z87891 Personal history of nicotine dependence: Secondary | ICD-10-CM | POA: Insufficient documentation

## 2016-04-30 DIAGNOSIS — I739 Peripheral vascular disease, unspecified: Secondary | ICD-10-CM | POA: Diagnosis not present

## 2016-04-30 DIAGNOSIS — Z7951 Long term (current) use of inhaled steroids: Secondary | ICD-10-CM | POA: Insufficient documentation

## 2016-04-30 DIAGNOSIS — E785 Hyperlipidemia, unspecified: Secondary | ICD-10-CM | POA: Insufficient documentation

## 2016-04-30 DIAGNOSIS — Q211 Atrial septal defect: Secondary | ICD-10-CM | POA: Insufficient documentation

## 2016-04-30 DIAGNOSIS — Z66 Do not resuscitate: Secondary | ICD-10-CM | POA: Diagnosis not present

## 2016-04-30 DIAGNOSIS — I340 Nonrheumatic mitral (valve) insufficiency: Secondary | ICD-10-CM

## 2016-04-30 DIAGNOSIS — I083 Combined rheumatic disorders of mitral, aortic and tricuspid valves: Secondary | ICD-10-CM | POA: Diagnosis not present

## 2016-04-30 DIAGNOSIS — Z79899 Other long term (current) drug therapy: Secondary | ICD-10-CM | POA: Insufficient documentation

## 2016-04-30 DIAGNOSIS — I4891 Unspecified atrial fibrillation: Secondary | ICD-10-CM

## 2016-04-30 HISTORY — PX: TEE WITHOUT CARDIOVERSION: SHX5443

## 2016-04-30 SURGERY — ECHOCARDIOGRAM, TRANSESOPHAGEAL
Anesthesia: Moderate Sedation

## 2016-04-30 MED ORDER — FENTANYL CITRATE (PF) 100 MCG/2ML IJ SOLN
INTRAMUSCULAR | Status: AC
  Filled 2016-04-30: qty 2

## 2016-04-30 MED ORDER — MIDAZOLAM HCL 10 MG/2ML IJ SOLN
INTRAMUSCULAR | Status: DC | PRN
  Administered 2016-04-30: 2 mg via INTRAVENOUS
  Administered 2016-04-30: 1 mg via INTRAVENOUS
  Administered 2016-04-30: 2 mg via INTRAVENOUS

## 2016-04-30 MED ORDER — MIDAZOLAM HCL 5 MG/ML IJ SOLN
INTRAMUSCULAR | Status: AC
  Filled 2016-04-30: qty 2

## 2016-04-30 MED ORDER — BUTAMBEN-TETRACAINE-BENZOCAINE 2-2-14 % EX AERO
INHALATION_SPRAY | CUTANEOUS | Status: DC | PRN
  Administered 2016-04-30: 2 via TOPICAL

## 2016-04-30 MED ORDER — FENTANYL CITRATE (PF) 100 MCG/2ML IJ SOLN
INTRAMUSCULAR | Status: DC | PRN
  Administered 2016-04-30 (×2): 25 ug via INTRAVENOUS

## 2016-04-30 MED ORDER — SODIUM CHLORIDE 0.9 % IV SOLN
INTRAVENOUS | Status: DC
  Administered 2016-04-30: 08:00:00 via INTRAVENOUS

## 2016-04-30 NOTE — CV Procedure (Signed)
During this procedure the patient is administered a total of Versed 5 mg and Fentanyl 50 mg to achieve and maintain moderate conscious sedation.  The patient's heart rate, blood pressure, and oxygen saturation are monitored continuously during the procedure. The period of conscious sedation is 24 minutes, of which I was present face-to-face 100% of this time.  TEE: Normal EF Mild MR Mild AR 30 mm LAA Watchman Occluder in great position with no leak See compression measurements in camtronics No LA thrombus PFO still present Normal RV No effusion  [name]

## 2016-04-30 NOTE — Discharge Instructions (Signed)

## 2016-04-30 NOTE — H&P (View-Only) (Signed)
History and Physical    [name] [id number] DOB: 1940-07-14 DOA: 04/04/2016  PCP: [name], MD Consultants:  Elbert Ewings- cardiology Patient coming from: home - lives with wife; [name], wife, [phone], [phone]  Chief Complaint: SOB  HPI: [name] is a 75 y.o. male with medical history significant of Afib on anticoagulation (since recent Watchman procedure), AAA, HTN, and HLD presenting with CAP.  Hasn't been feeling well for the last few days.  Diagnosed with an ear infection yesterday AM and started on antibiotic ear drop.  Struggled with ongoing ear pain during the day yesterday without improvement.  Started with SOB this afternoon.  Mild cough Friday and yesterday, worse today, productive of brownish sputum.  Subjective fever this AM.  Decreased appetite for a few days, not drinking well.  Mild diarrhea since yesterday, 3+ yesterday and 2 today.   ED Course: Per Dr. Stark Jock: Patient presents with cough, congestion, and shortness of breath since yesterday. His checks x-ray reveals a left-sided pneumonia. He is also tachycardic with an elevated white count. He was given Rocephin and Zithromax. I've spoken with Dr. Lorin Mercy from the hospitalist service who will admit the patient.  Review of Systems: As per HPI; otherwise 10 point review of systems reviewed and negative.   Ambulatory Status:  ambulates independently  Past Medical History:  Diagnosis Date  . Aneurysm ([place])   . Arthritis   . Degenerative joint disease   . History of blood transfusion 12/2007   "bleeding for Warfarin"  . Hyperlipidemia   . Hypertension    Admitted for chest pain and 06/2011-neg stress Myoview,mod. LVH with nl EF on echo; TSH of 3.75  . Meniere's disease   . Nephrolithiasis   . 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 Permanent atrial fibrillation ([place])    a. s/p Watchman  . Renal hemorrhage, right 2009   2009 while  anticoagulated with Coumadin; treated with the renal artery embolization    Past Surgical History:  Procedure Laterality Date  . EMBOLIZATION  12/2007   Left renal hematoma, status post angiogram and embolization/notes 09/29/2010  . FRACTURE SURGERY    . LEFT ATRIAL APPENDAGE OCCLUSION N/A 03/18/2016   Procedure: LEFT ATRIAL APPENDAGE OCCLUSION;  Surgeon: [name], MD;  Location: [place] [facility];  Service: Cardiovascular;  Laterality: N/A;  . ORIF FEMUR FRACTURE Right 1960   "2 steel rods in leg"  . PILONIDAL CYST EXCISION    . TEE WITHOUT CARDIOVERSION N/A 03/10/2016   Procedure: TRANSESOPHAGEAL ECHOCARDIOGRAM (TEE);  Surgeon: Josue Hector, MD;  Location: [facility] ENDOS[facility];  Service: Cardiovascular;  Laterality: N/A;    Social History   Social History  . Marital status: Married    Spouse name: N/A  . Number of children: 2  . Years of education: N/A   Occupational History  . Retired     Chief Technology Officer job   Social History Main Topics  . Smoking status: Former Smoker    Packs/day: 1.00    Years: 40.00    Quit date: 01/20/2008  . Smokeless tobacco: Never Used  . Alcohol use 0.6 oz/week    1 Standard drinks or equivalent per week     Comment: rare wine  . Drug use: No  . Sexual activity: Yes   Other Topics Concern  . Not on file   Social History Narrative   Retired from Architect   Lives in [place]  Allergen Reactions  . Statins     Muscle aches  . Xarelto [Rivaroxaban]     Developed redness in both legs    Family History  Problem Relation Age of Onset  . Hypertension Mother   . Cancer Mother   . Hyperlipidemia Mother   . Atrial fibrillation Sister   . Stroke Father     Prior to Admission medications   Medication Sig Start Date End Date Taking? Authorizing Provider  acetaminophen (TYLENOL) 500 MG tablet Take 1,000 mg by mouth every 8 (eight) hours as needed (pain).   Yes Historical Provider, MD  apixaban (ELIQUIS) 5  MG TABS tablet Take 1 tablet (5 mg total) by mouth 2 (two) times daily. 03/19/16  Yes Eileen Stanford, PA-C  Ascorbic Acid (VITAMIN C) 1000 MG tablet Take 1,000 mg by mouth daily.   Yes Historical Provider, MD  aspirin 81 MG tablet Take 81 mg by mouth daily.   Yes Historical Provider, MD  cetirizine (ZYRTEC) 10 MG tablet Take 10 mg by mouth at bedtime.    Yes Historical Provider, MD  CIPRODEX otic suspension Place 4 drops in ear(s) 2 (two) times daily. 04/03/16  Yes Historical Provider, MD  diltiazem (CARDIZEM CD) 240 MG 24 hr capsule Take 240 mg by mouth at bedtime.    Yes Historical Provider, MD  fluticasone (FLONASE) 50 MCG/ACT nasal spray Place 2 sprays into both nostrils every evening.  02/23/16  Yes Historical Provider, MD  meclizine (ANTIVERT) 25 MG tablet Take 25 mg by mouth 2 (two) times daily as needed. For dizziness   Yes Historical Provider, MD  metoprolol succinate (TOPROL-XL) 25 MG 24 hr tablet Take 0.5 tablets (12.5 mg total) by mouth daily. 03/25/16  Yes [name], MD  Multiple Vitamin (MULTIVITAMIN) tablet Take 1 tablet by mouth daily.   Yes Historical Provider, MD  pravastatin (PRAVACHOL) 20 MG tablet TAKE ONE TABLET BY MOUTH ONCE EVERY EVENING. 04/03/13  Yes Lendon Colonel, NP  traZODone (DESYREL) 50 MG tablet Take 50 mg by mouth at bedtime.   Yes Historical Provider, MD  triamterene-hydrochlorothiazide (MAXZIDE-25) 37.5-25 MG per tablet Take 1 tablet by mouth daily. 09/20/11  Yes Yehuda Savannah, MD  Vitamin D, Cholecalciferol, 1000 units TABS Take 1,000 Units by mouth at bedtime.   Yes Historical Provider, MD    Physical Exam: Vitals:   04/04/16 2030 04/04/16 2033 04/04/16 2100 04/04/16 2130  BP: 124/75  115/77 111/79  Pulse: (!) 125  115 (!) 123  Resp: (!) 40  (!) 34 (!) 29  Temp:      TempSrc:      SpO2: 94% 94% 95% 95%  Weight:      Height:         General: Appears calm and comfortable and is NAD, mild tachypnea evident Eyes:  PERRL, EOMI, normal lids,  iris ENT:  grossly normal hearing, lips & tongue, mmm Neck:  no LAD, masses or thyromegaly Cardiovascular:  Irregularly irregular rate/rhythm with mild tachycardia, no m/r/g. No LE edema.  Respiratory:  Scant left-sided ronchi, no w/r/c. Mild tachypnea. Abdomen:  soft, ntnd, NABS Skin:  no rash or induration seen on limited exam Musculoskeletal:  grossly normal tone BUE/BLE, good ROM, no bony abnormality Psychiatric:  grossly normal mood and affect, speech fluent and appropriate, AOx3 Neurologic:  CN 2-12 grossly intact, moves all extremities in coordinated fashion, sensation intact  Labs on Admission: I have personally reviewed following labs and imaging studies  CBC:  Recent Labs Lab 04/04/16  2018  WBC 14.7*  NEUTROABS 12.7*  HGB 13.3  HCT 38.3*  MCV 90.3  PLT 99991111   Basic Metabolic Panel:  Recent Labs Lab 04/04/16 2018  NA 131*  K 3.8  CL 96*  CO2 25  GLUCOSE 148*  BUN 18  CREATININE 1.29*  CALCIUM 8.6*   GFR: Estimated Creatinine Clearance: 57.4 mL/min (by C-G formula based on SCr of 1.29 mg/dL (H)). Liver Function Tests:  Recent Labs Lab 04/04/16 2018  AST 17  ALT 17  ALKPHOS 54  BILITOT 1.1  PROT 7.1  ALBUMIN 3.1*   No results for input(s): LIPASE, AMYLASE in the last 168 hours. No results for input(s): AMMONIA in the last 168 hours. Coagulation Profile: No results for input(s): INR, PROTIME in the last 168 hours. Cardiac Enzymes:  Recent Labs Lab 04/04/16 2018  TROPONINI <0.03   BNP (last 3 results) No results for input(s): PROBNP in the last 8760 hours. HbA1C: No results for input(s): HGBA1C in the last 72 hours. CBG: No results for input(s): GLUCAP in the last 168 hours. Lipid Profile: No results for input(s): CHOL, HDL, LDLCALC, TRIG, CHOLHDL, LDLDIRECT in the last 72 hours. Thyroid Function Tests: No results for input(s): TSH, T4TOTAL, FREET4, T3FREE, THYROIDAB in the last 72 hours. Anemia Panel: No results for input(s): VITAMINB12,  FOLATE, FERRITIN, TIBC, IRON, RETICCTPCT in the last 72 hours. Urine analysis:    Component Value Date/Time   COLORURINE YELLOW 01/07/2008 [place] 01/07/2008 1321   LABSPEC 1.025 01/07/2008 1321   PHURINE 6.0 01/07/2008 1321   GLUCOSEU NEGATIVE 01/07/2008 1321   HGBUR SMALL (A) 01/07/2008 1321   BILIRUBINUR NEGATIVE 01/07/2008 1321   KETONESUR NEGATIVE 01/07/2008 1321   PROTEINUR NEGATIVE 01/07/2008 1321   UROBILINOGEN 0.2 01/07/2008 1321   NITRITE NEGATIVE 01/07/2008 1321   LEUKOCYTESUR NEGATIVE 01/07/2008 1321    Creatinine Clearance: Estimated Creatinine Clearance: 57.4 mL/min (by C-G formula based on SCr of 1.29 mg/dL (H)).  Sepsis Labs: @LABRCNTIP (procalcitonin:4,lacticidven:4) )No results found for this or any previous visit (from the past 240 hour(s)).   Radiological Exams on Admission: Dg Chest 2 View  Result Date: 04/04/2016 CLINICAL DATA:  Initial evaluation for acute shortness of breath, cough, congestion. EXAM: CHEST  2 VIEW COMPARISON:  Prior radiograph from 11/21/2014. FINDINGS: Cardiomegaly is stable from prior. Mediastinal silhouette within normal limits. Lungs are mildly hypoinflated. There is patchy parenchymal opacity within the left upper lobe, concerning for possible pneumonia. Some of this opacity is somewhat more wedge-shaped, and likely reflects superimposed atelectasis. Additional patchy opacity at the left lung base may reflect additional infiltrate or atelectasis. Small left pleural effusion. Right lung is clear. No pulmonary edema. No pneumothorax. No acute osseous abnormality. IMPRESSION: 1. Patchy left upper lobe opacity, concerning for possible pneumonia given the history of cough and congestion. Radiographic follow-up to resolution is recommended as an underlying mass is not excluded. 2. Additional left basilar opacity, which may reflect additional infiltrate and/or atelectasis. 3. Small left pleural effusion. 4. Stable cardiomegaly.  Electronically Signed   By: Jeannine Boga M.D.   On: 04/04/2016 21:04    EKG: Independently reviewed.  NSR with rate 122; nonspecific ST changes with no evidence of acute ischemia  Assessment/Plan Principal Problem:   Sepsis ([place]) Active Problems:   Hypertension   Hyperlipidemia   Chronic atrial fibrillation (HCC)   CAP (community acquired pneumonia)   Hyponatremia   AKI (acute kidney injury) ([place])   Hyperglycemia   Sepsis, likely due to CAP -Given  productive cough, mildly decreased oxygen saturation, tachycardia, tachypnea, and leukocytosis in conjunction with infiltrate and possibly multifocal PNA on chest x-ray, most likely community-acquired pneumonia.  -Other etiologies include aspiration (possibly during recent Watchman insertion) versus URI (most likely viral). -He does meet sepsis criteria and has an elevated lactate but has not had hypotension or other hemodynamic instability. -Will start-Azithromycin 500 mg IV x 1 and then 500 mg PO daily AND Rocephin. -Sepsis protocol initiated; has not yet received all of IVF bolus but started on abx and IVF ordered by Dr. Stark Jock. -Blood and sputum cultures pending, as well as Strep pneumo testing -Will admit with telemetry and continue to monitor -Will trend lactate to ensure improvement - LR @ 75cc/hr - Fever control - Repeat CBC in am - albuterol PRN - Duonebs q6h  Hyponatremia/AKI -Thought to be due to volume deficiency -Dr. Stark Jock to order bolus in ER -Will continue overnight IVF and recheck BMP in AM  HTN -Continue Cardizem and Toprol -prn Labetolol for tachycardia -If unable to control HR with home meds once he has been given IVF, he may need transfer to SDU for Cardizem drip for rate control  HLD -Continue Pravachol  Afib -s/p Watchman insertion on 10/19 -CHA2DS2-VASc score is 3 -Patient on Eliquis  Hyperglycemia -h/o intermittent hyperglycemia -No A1c located in Epic -Will order A1c and if elevated, may need  SSI   DVT prophylaxis: Eliquis Code Status: DNR - confirmed with patient/family Family Communication: wife present throughout evaluation Disposition Plan:  Home once clinically improved Consults called: None  Admission status: Admit - It is my clinical opinion that admission to INPATIENT is reasonable and necessary because this patient will require at least 2 midnights in the hospital to treat this condition based on the medical complexity of the problems presented.  Given the aforementioned information, the predictability of an adverse outcome is felt to be significant.     Karmen Bongo MD [facility]  If 7PM-7AM, please contact night-coverage [email or web address] Password [email or web address]  04/04/2016, 10:03 PM

## 2016-04-30 NOTE — Interval H&P Note (Signed)
History and Physical Interval Note:  04/30/2016 8:32 AM  [name]  has presented today for surgery, with the diagnosis of AFIB  The various methods of treatment have been discussed with the patient and family. After consideration of risks, benefits and other options for treatment, the patient has consented to  Procedure(s): TRANSESOPHAGEAL ECHOCARDIOGRAM (TEE) (N/A) as a surgical intervention .  The patient's history has been reviewed, patient examined, no change in status, stable for surgery.  I have reviewed the patient's chart and labs.  Questions were answered to the patient's satisfaction.     [name]

## 2016-04-30 NOTE — Progress Notes (Signed)
  Echocardiogram Echocardiogram Transesophageal has been performed.  [name] 04/30/2016, 9:25 AM

## 2016-04-30 NOTE — Interval H&P Note (Signed)
History and Physical Interval Note:  04/30/2016 7:50 AM  [name]  has presented today for surgery, with the diagnosis of AFIB  The various methods of treatment have been discussed with the patient and family. After consideration of risks, benefits and other options for treatment, the patient has consented to  Procedure(s): TRANSESOPHAGEAL ECHOCARDIOGRAM (TEE) (N/A) as a surgical intervention .  The patient's history has been reviewed, patient examined, no change in status, stable for surgery.  I have reviewed the patient's chart and labs.  Questions were answered to the patient's satisfaction.     [name]

## 2016-05-02 ENCOUNTER — Encounter (HOSPITAL_COMMUNITY): Payer: Self-pay | Admitting: Cardiovascular Disease

## 2016-05-05 NOTE — Progress Notes (Signed)
Electrophysiology Office Note   Date:  05/06/2016   ID:  [name], DOB 11/16/1940, MRN [id number]  PCP:  [name], MD  Cardiologist:  Dr [name] Primary Electrophysiologist: Rayann Heman    CC: Watchman follow up   History of Present Illness: [name] is a 75 y.o. male who presents today for 6 week watchman follow up. He has done well post procedure.  He has not noticed any bleeding issues.  Today, he denies symptoms of palpitations, chest pain, shortness of breath, orthopnea, PND, lower extremity edema, claudication, presyncope, syncope, bleeding, or neurologic sequela. The patient is tolerating medications without difficulties and is otherwise without complaint today.    Past Medical History:  Diagnosis Date  . Aneurysm ([place])   . Arthritis   . Degenerative joint disease   . History of blood transfusion 12/2007   "bleeding for Warfarin"  . Hyperlipidemia   . Hypertension    Admitted for chest pain and 06/2011-neg stress Myoview,mod. LVH with nl EF on echo; TSH of 3.75  . Meniere's disease   . Nephrolithiasis   . 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 Permanent atrial fibrillation ([place])    a. s/p Watchman  . Renal hemorrhage, right 2009   2009 while anticoagulated with Coumadin; treated with the renal artery embolization   Past Surgical History:  Procedure Laterality Date  . EMBOLIZATION  12/2007   Left renal hematoma, status post angiogram and embolization/notes 09/29/2010  . FRACTURE SURGERY    . LEFT ATRIAL APPENDAGE OCCLUSION N/A 03/18/2016   Procedure: LEFT ATRIAL APPENDAGE OCCLUSION;  Surgeon: [name], MD;  Location: [place] [facility];  Service: Cardiovascular;  Laterality: N/A;  . ORIF FEMUR FRACTURE Right 1960   "2 steel rods in leg"  . PILONIDAL CYST EXCISION    . TEE WITHOUT CARDIOVERSION N/A 03/10/2016   Procedure: TRANSESOPHAGEAL ECHOCARDIOGRAM (TEE);  Surgeon: Josue Hector, MD;   Location: [facility];  Service: Cardiovascular;  Laterality: N/A;  . TEE WITHOUT CARDIOVERSION N/A 04/30/2016   Procedure: TRANSESOPHAGEAL ECHOCARDIOGRAM (TEE);  Surgeon: Josue Hector, MD;  Location: [facility] ENDOSCOPY;  Service: Cardiovascular;  Laterality: N/A;     Current Outpatient Prescriptions  Medication Sig Dispense Refill  . acetaminophen (TYLENOL) 500 MG tablet Take 1,000 mg by mouth every 8 (eight) hours as needed (pain).    . Ascorbic Acid (VITAMIN C) 1000 MG tablet Take 1,000 mg by mouth daily.    Marland Kitchen aspirin EC 325 MG tablet Take 325 mg by mouth daily.    . cetirizine (ZYRTEC) 10 MG tablet Take 10 mg by mouth at bedtime.     Marland Kitchen diltiazem (CARDIZEM CD) 240 MG 24 hr capsule Take 240 mg by mouth at bedtime.     . fluticasone (FLONASE) 50 MCG/ACT nasal spray Place 2 sprays into both nostrils every evening.     . meclizine (ANTIVERT) 25 MG tablet Take 25 mg by mouth 2 (two) times daily as needed. For dizziness    . metoprolol succinate (TOPROL-XL) 25 MG 24 hr tablet Take 0.5 tablets (12.5 mg total) by mouth daily.    . Multiple Vitamin (MULTIVITAMIN) tablet Take 1 tablet by mouth daily.    . pravastatin (PRAVACHOL) 20 MG tablet TAKE ONE TABLET BY MOUTH ONCE EVERY EVENING. 90 tablet 3  . traZODone (DESYREL) 50 MG tablet Take 50 mg by mouth at bedtime.    . triamterene-hydrochlorothiazide (MAXZIDE-25) 37.5-25 MG per tablet Take 1  tablet by mouth daily.    . Vitamin D, Cholecalciferol, 1000 units TABS Take 1,000 Units by mouth at bedtime.    . clopidogrel (PLAVIX) 75 MG tablet Take 1 tablet (75 mg total) by mouth daily. 90 tablet 3   No current facility-administered medications for this visit.     Allergies:   Statins and Xarelto [rivaroxaban]   Social History:  The patient  reports that he quit smoking about 8 years ago. He has a 40.00 pack-year smoking history. He has never used smokeless tobacco. He reports that he drinks about 0.6 oz of alcohol per week . He reports that he does not  use drugs.   Family History:  The patient's family history includes Atrial fibrillation in his sister; Cancer in his mother; Hyperlipidemia in his mother; Hypertension in his mother; Stroke in his father.    ROS:  Please see the history of present illness.   All other systems are reviewed and negative.    PHYSICAL EXAM: VS:  BP 122/76   Pulse (!) 52   Ht 5\' 9"  (1.753 m)   Wt 204 lb (92.5 kg)   BMI 30.13 kg/m  , BMI Body mass index is 30.13 kg/m. GEN: Well nourished, well developed, in no acute distress  HEENT: normal  Neck: no JVD, carotid bruits, or masses Cardiac: iRRR; no murmurs, rubs, or gallops,no edema  Respiratory:  clear to auscultation bilaterally, normal work of breathing GI: soft, nontender, nondistended, + BS MS: no deformity or atrophy  Skin: warm and dry  Neuro:  Strength and sensation are intact Psych: euthymic mood, full affect  EKG:  EKG is not ordered today.  Recent Labs: 04/04/2016: ALT 17; B Natriuretic Peptide 249.0 04/07/2016: BUN 13; Creatinine, Ser 0.84; Hemoglobin 11.1; Platelets 209; Potassium 3.4; Sodium 134    Lipid Panel     Component Value Date/Time   CHOL 161 12/08/2012 0705   TRIG 165 (H) 12/08/2012 0705   HDL 37 (L) 12/08/2012 0705   CHOLHDL 4.4 12/08/2012 0705   VLDL 33 12/08/2012 0705   LDLCALC 91 12/08/2012 0705     Wt Readings from Last 3 Encounters:  05/06/16 204 lb (92.5 kg)  04/30/16 201 lb (91.2 kg)  04/04/16 201 lb 8 oz (91.4 kg)      ASSESSMENT AND PLAN:  1.  Persistent afib Doing well s/p Watchman.   TEE earlier this week with good seal and no leak around device Stop Eliquis, start Plavix 75mg  daily, increase ASA to 325mg  daily  2.  Hypotension Stable No change required today   Current medicines are reviewed at length with the patient today.   The patient does not have concerns regarding his medicines.  The following changes were made today:  Stop Eliquis, start Plavix 75mg  daily, increase ASA to 325mg   daily  Follow up with me 08/2016  Signed, Chanetta Marshall, NP 05/06/2016 12:23 PM     [facility] [address] [place] [address] [phone] (office) [phone] (fax)

## 2016-05-06 ENCOUNTER — Ambulatory Visit (INDEPENDENT_AMBULATORY_CARE_PROVIDER_SITE_OTHER): Payer: PPO | Admitting: Nurse Practitioner

## 2016-05-06 VITALS — BP 122/76 | HR 52 | Ht 69.0 in | Wt 204.0 lb

## 2016-05-06 DIAGNOSIS — I951 Orthostatic hypotension: Secondary | ICD-10-CM | POA: Diagnosis not present

## 2016-05-06 DIAGNOSIS — I481 Persistent atrial fibrillation: Secondary | ICD-10-CM

## 2016-05-06 DIAGNOSIS — I4819 Other persistent atrial fibrillation: Secondary | ICD-10-CM

## 2016-05-06 MED ORDER — CLOPIDOGREL BISULFATE 75 MG PO TABS: 75.0000 mg | ORAL_TABLET | Freq: Every day | ORAL | 3 refills | Status: DC

## 2016-05-06 NOTE — Patient Instructions (Signed)
Medication Instructions:   STOP TAKING  ELIQUIS   START TAKING PLAVIX 75 MG ONCE A DAY   START TAKING ASPIRIN 325 MG ONCE A DAY   If you need a refill on your cardiac medications before your next appointment, please call your pharmacy.  Labwork: NONE ORDERED  TODAY    Testing/Procedures: NONE ORDERED  TODAY    Follow-Up: AMBER  IN APRIL   Any Other Special Instructions Will Be Listed Below (If Applicable).

## 2016-06-17 IMAGING — US US ABDOMEN LIMITED
1 series · 14 of 25 positions shown · non-contrast
Comparison: CT scan of June 03, 2015.

CLINICAL DATA: Right upper quadrant abdominal pain for 10 days.

EXAM:
US ABDOMEN LIMITED - RIGHT UPPER QUADRANT

[Series 1: us abdomen limited · 0.20mm/px · 14 of 91 slices shown]
[im 1/91]
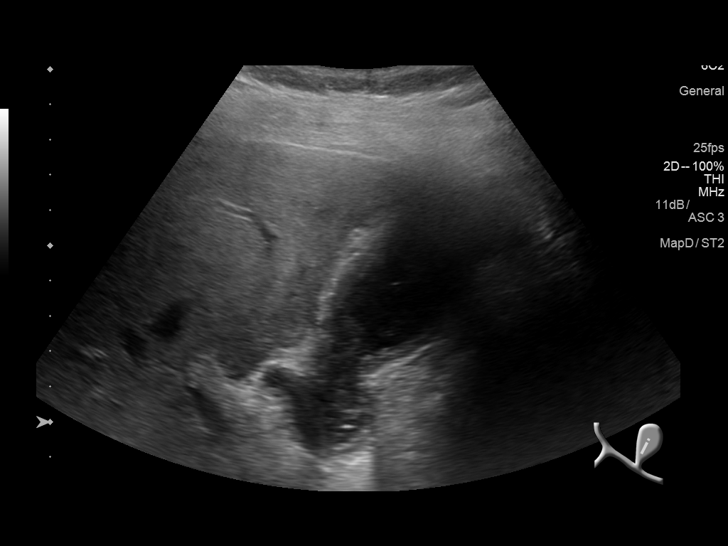
[im 8/91]
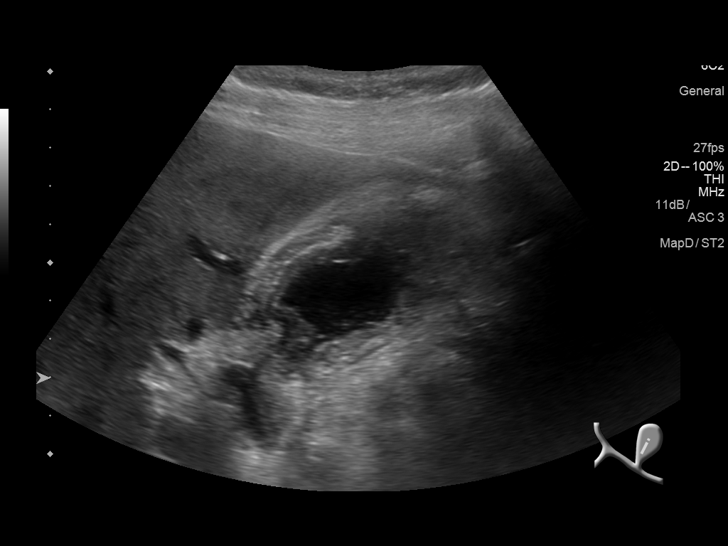
[im 16/91]
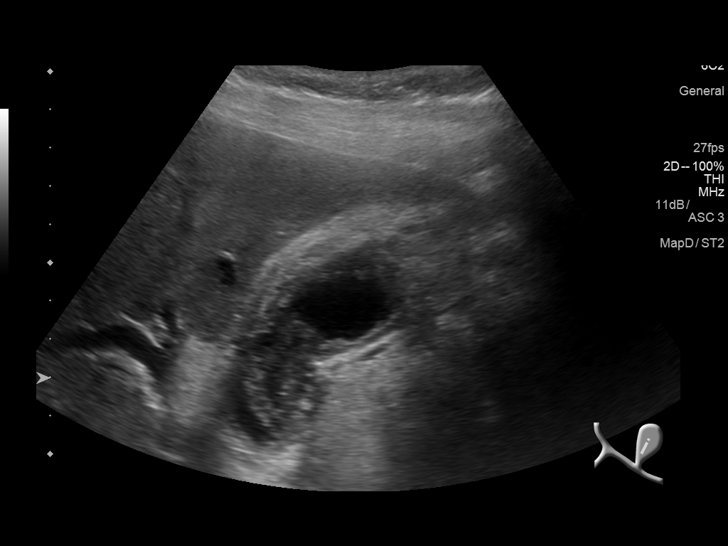
[im 23/91]
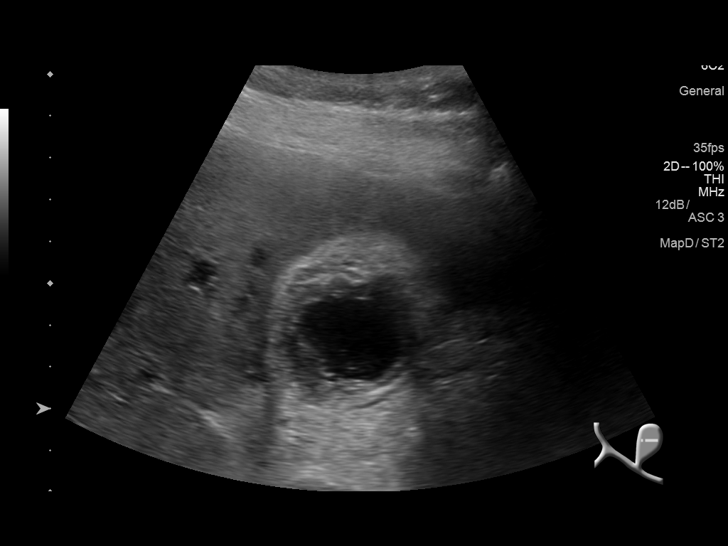
[im 31/91]
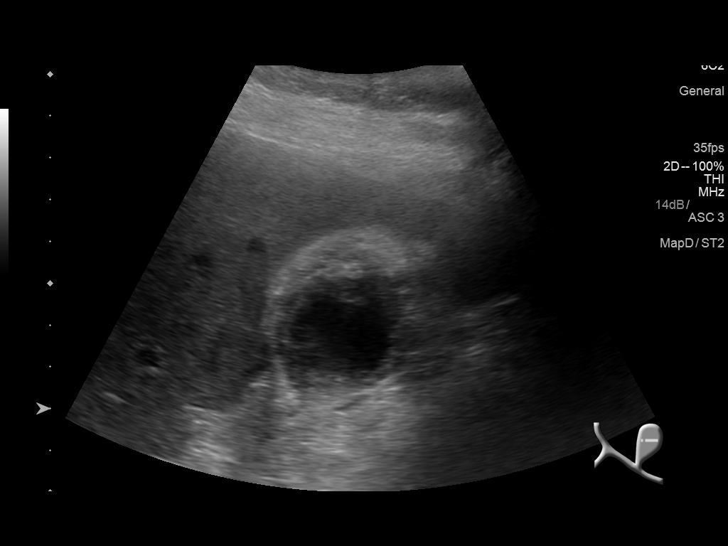
[im 34/91]
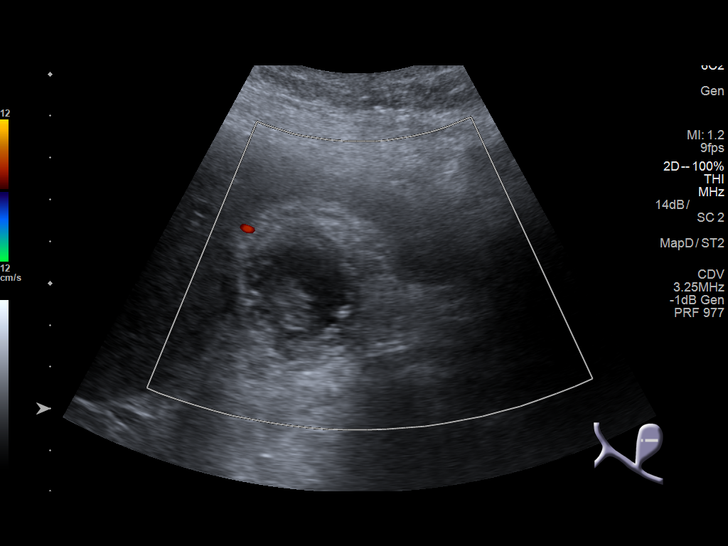
[im 42/91]
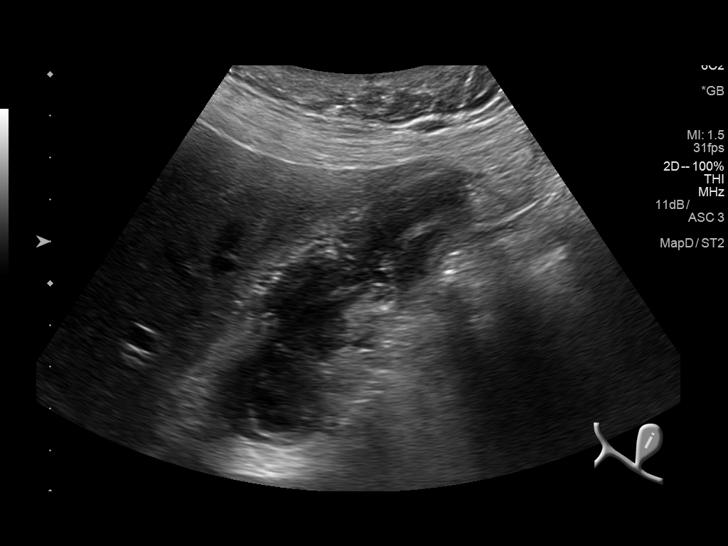
[im 49/91]
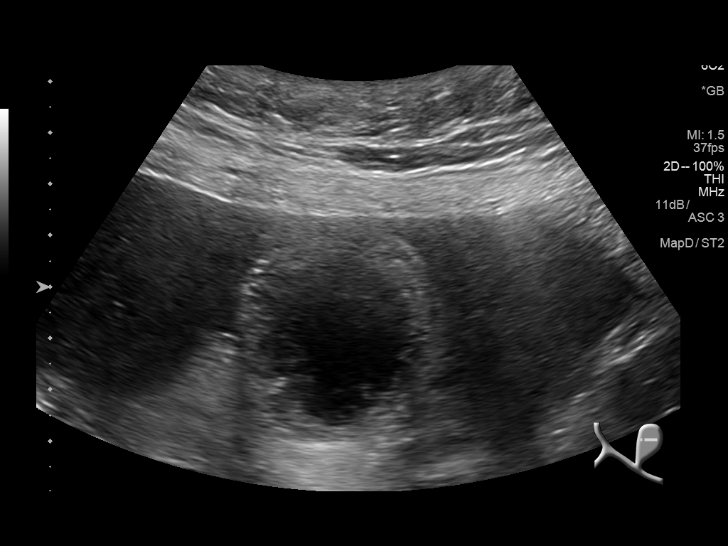
[im 57/91]
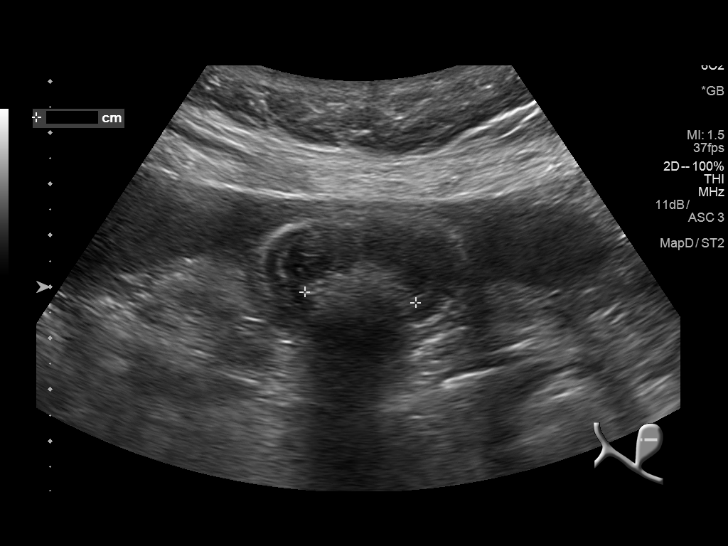
[im 61/91]
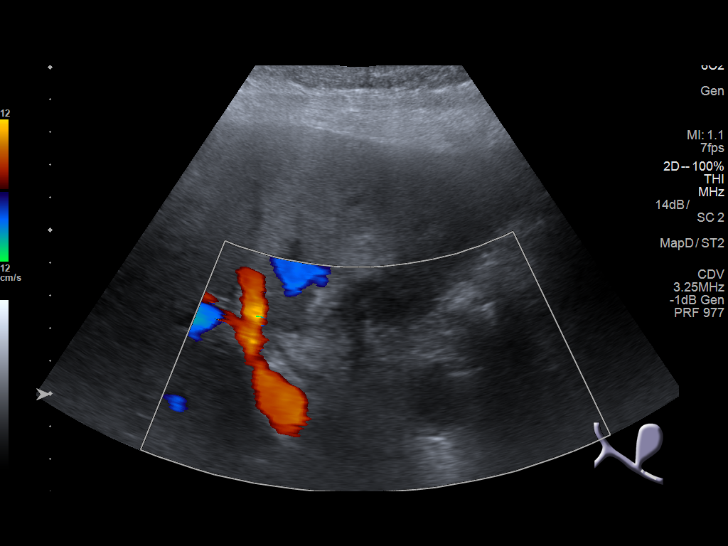
[im 68/91]
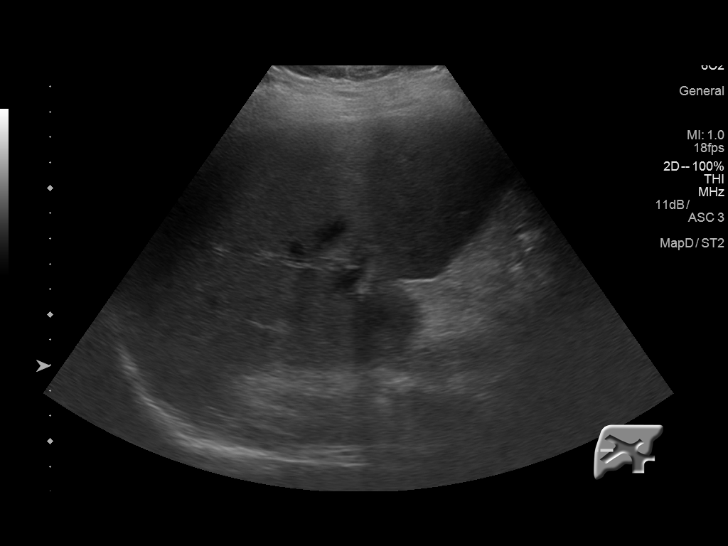
[im 76/91]
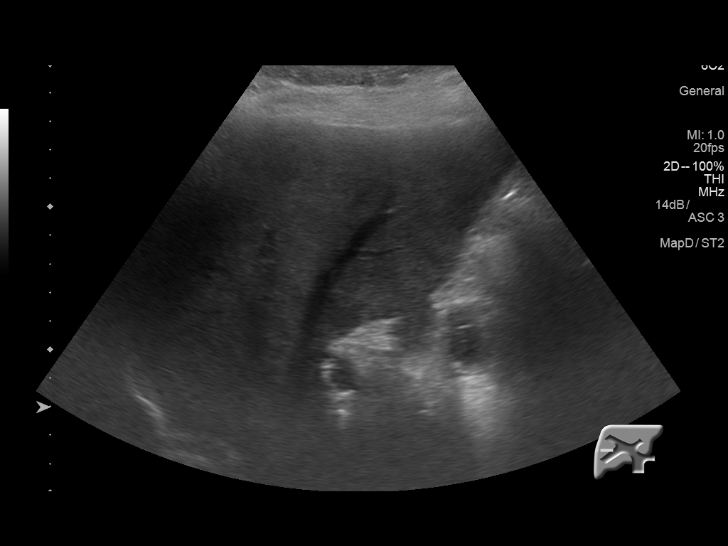
[im 83/91]
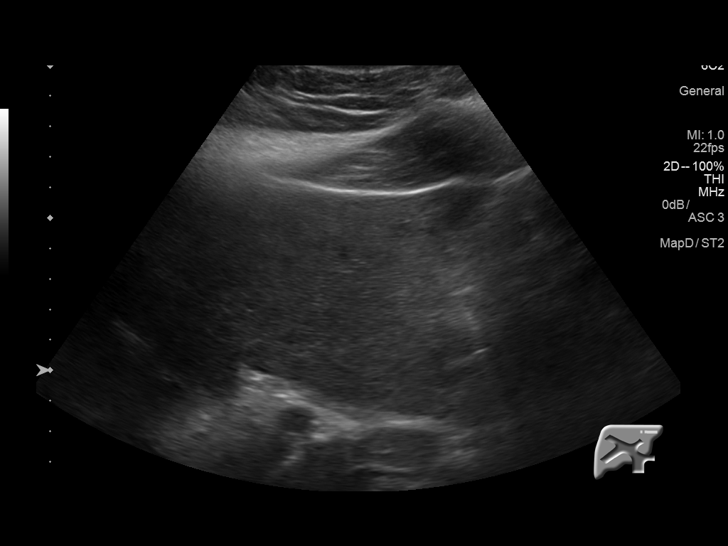
[im 91/91]
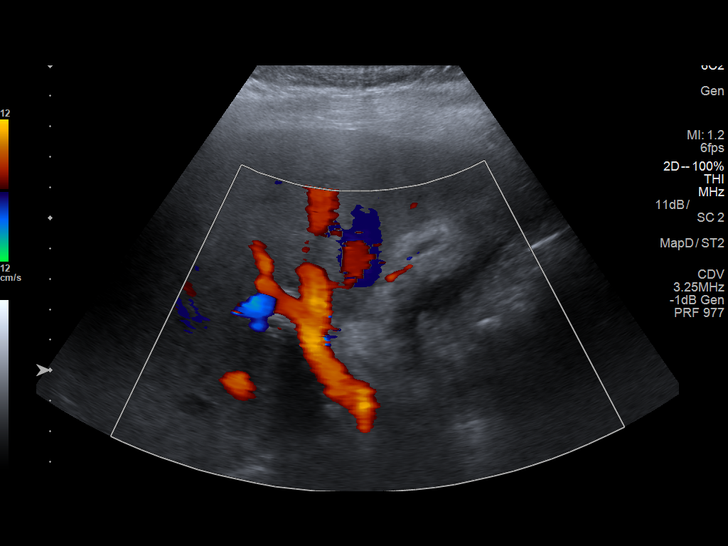

[14 of 25 positions shown; findings below may reference images not displayed]

FINDINGS: Gallbladder:

Sludge is noted with probable small gallstones within gallbladder
lumen. No pericholecystic fluid is noted. No sonographic Murphy's
sign is noted. However, severe irregular gallbladder wall thickening
measuring 17 mm is noted highly concerning for acute cholecystitis.

Common bile duct:

Diameter: 5.5 mm which is within normal limits.

Liver:

No focal lesion identified. Within normal limits in parenchymal
echogenicity.
IMPRESSION: Severe irregular gallbladder wall thickening is noted consistent
with acute cholecystitis. Minimal cholelithiasis with sludge is
present as well.

## 2016-06-22 ENCOUNTER — Encounter: Payer: Self-pay | Admitting: Family

## 2016-06-29 ENCOUNTER — Ambulatory Visit (INDEPENDENT_AMBULATORY_CARE_PROVIDER_SITE_OTHER): Payer: PPO | Admitting: Family

## 2016-06-29 ENCOUNTER — Encounter: Payer: Self-pay | Admitting: Family

## 2016-06-29 ENCOUNTER — Ambulatory Visit (HOSPITAL_COMMUNITY)
Admission: RE | Admit: 2016-06-29 | Discharge: 2016-06-29 | Disposition: A | Discharge status: Home / Self Care | Payer: PPO | Source: Ambulatory Visit | Attending: Family | Admitting: Family

## 2016-06-29 VITALS — BP 137/93 | HR 66 | Temp 97.4°F | Resp 20 | Ht 69.0 in | Wt 202.9 lb

## 2016-06-29 DIAGNOSIS — I714 Abdominal aortic aneurysm, without rupture, unspecified: Secondary | ICD-10-CM

## 2016-06-29 DIAGNOSIS — R0989 Other specified symptoms and signs involving the circulatory and respiratory systems: Secondary | ICD-10-CM

## 2016-06-29 DIAGNOSIS — I723 Aneurysm of iliac artery: Secondary | ICD-10-CM

## 2016-06-29 NOTE — Patient Instructions (Signed)
Abdominal Aortic Aneurysm Blood pumps away from the heart through tubes (blood vessels) called arteries. Aneurysms are weak or damaged places in the wall of an artery. It bulges out like a balloon. An abdominal aortic aneurysm happens in the main artery of the body (aorta). It can burst or tear, causing bleeding inside the body. This is an emergency. It needs treatment right away. What are the causes? The exact cause is unknown. Things that could cause this problem include:  Fat and other substances building up in the lining of a tube.  Swelling of the walls of a blood vessel.  Certain tissue diseases.  Belly (abdominal) trauma.  An infection in the main artery of the body.  What increases the risk? There are things that make it more likely for you to have an aneurysm. These include:  Being over the age of 74 years old.  Having high blood pressure (hypertension).  Being a male.  Being white.  Being very overweight (obese).  Having a family history of aneurysm.  Using tobacco products.  What are the signs or symptoms? Symptoms depend on the size of the aneurysm and how fast it grows. There may not be symptoms. If symptoms occur, they can include:  Pain (belly, side, lower back, or groin).  Feeling full after eating a small amount of food.  Feeling sick to your stomach (nauseous), throwing up (vomiting), or both.  Feeling a lump in your belly that feels like it is beating (pulsating).  Feeling like you will pass out (faint).  How is this treated?  Medicine to control blood pressure and pain.  Imaging tests to see if the aneurysm gets bigger.  Surgery. How is this prevented? To lessen your chance of getting this condition:  Stop smoking. Stop chewing tobacco.  Limit or avoid alcohol.  Keep your blood pressure, blood sugar, and cholesterol within normal limits.  Eat less salt.  Eat foods low in saturated fats and cholesterol. These are found in animal and  whole dairy products.  Eat more fiber. Fiber is found in whole grains, vegetables, and fruits.  Keep a healthy weight.  Stay active and exercise often.  This information is not intended to replace advice given to you by your health care provider. Make sure you discuss any questions you have with your health care provider. Document Released: 09/11/2012 Document Revised: 10/23/2015 Document Reviewed: 06/16/2012 Elsevier Interactive Patient Education  2017 [employer]  

## 2016-06-29 NOTE — Progress Notes (Signed)
[facility] [place]   CC: Follow up Abdominal Aortic Aneurysm  History of Present Illness  [name] is a 76 y.o. (03/24/1941) male patient of Dr. Kellie Simmering returns for continued follow-up regarding an abdominal aortic aneurysm. He recently had nominal discomfort which lasted about 1 hour. He was seen in the emergency department and eventually had CT scan which Dr. Kellie Simmering reviewed at pt's visit on 12/17/14. This revealed the aneurysm to be approximately 4 cm in maximum diameter with no evidence of leakage. Patient is seen today in continued follow-up. He does have some mild claudication type symptoms in both legs after walking several blocks which is not limiting him. Pt's wife states that pt had bleeding from a renal artery in 2009 caused a great deal of internal bleeding, was in ICU for a week, states this artery was cauterized. He was on  Warfarin at that time for a-fib; has not been on warfarin since then. He takes ASA and rate control medications.  He has constant RLQ abdominal pain that has exacerbations and waning pain since about 2016; this has been evaluated with no etiology found.  He has occasional low back pain when he lifts or strains, but no pain referable to AAA.    Pt denies any know hx of stroke or TIA.  He had another CTA abd/pelvis on 06/03/15 requested by Dr. Nevada Crane, pt's PCP, to evaluate abdominal pain, see results below. Dr. Kellie Simmering last saw pt on 12/17/14. At that time CT showed a4 cm abdominal aortic aneurysm which does extend above the level of renal arteries proximally -not a stent graft candidate  Aneurysm has not changed significantly in size over the past few years -approximately 4 cm. He reports occasional right groin pain which he states may be a hernia.  Pt reports chronic low back pain, no new back pain.  The patient reports tired feeling in both calves if he walks about 1/4 mile uphill; states Dr. Lattie Haw requested and pt had ABI's  performed years ago, states this was normal.   Wife states pt's blood pressure at home runs 110-120's/60-70; states he has not yet taken his AM meds as he was NPO for the AAA duplex.   He is taking Plavix and 325 mg ASA for his atrial fib, but states the Plavix will be stopped about April or May 2018, this is managed by Dr. Rayann Heman.   Pt Diabetic: No Pt smoker: former smoker, quit in 2009   Past Medical History:  Diagnosis Date  . AAA (abdominal aortic aneurysm) ([place])   . Aneurysm ([place])   . Arthritis   . Degenerative joint disease   . History of blood transfusion 12/2007   "bleeding for Warfarin"  . Hyperlipidemia   . Hypertension    Admitted for chest pain and 06/2011-neg stress Myoview,mod. LVH with nl EF on echo; TSH of 3.75  . Meniere's disease   . Nephrolithiasis   . 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 Permanent atrial fibrillation ([place])    a. s/p Watchman  . Renal hemorrhage, right 2009   2009 while anticoagulated with Coumadin; treated with the renal artery embolization   Past Surgical History:  Procedure Laterality Date  . EMBOLIZATION  12/2007   Left renal hematoma, status post angiogram and embolization/notes 09/29/2010  . FRACTURE SURGERY    . LEFT ATRIAL APPENDAGE OCCLUSION N/A 03/18/2016   Procedure: LEFT ATRIAL APPENDAGE OCCLUSION;  Surgeon: [name],  MD;  Location: [facility];  Service: Cardiovascular;  Laterality: N/A;  . ORIF FEMUR FRACTURE Right 1960   "2 steel rods in leg"  . PILONIDAL CYST EXCISION    . TEE WITHOUT CARDIOVERSION N/A 03/10/2016   Procedure: TRANSESOPHAGEAL ECHOCARDIOGRAM (TEE);  Surgeon: Josue Hector, MD;  Location: [facility];  Service: Cardiovascular;  Laterality: N/A;  . TEE WITHOUT CARDIOVERSION N/A 04/30/2016   Procedure: TRANSESOPHAGEAL ECHOCARDIOGRAM (TEE);  Surgeon: Josue Hector, MD;  Location: [facility];  Service: Cardiovascular;  Laterality: N/A;    Social History Social History   Social History  . Marital status: Married    Spouse name: N/A  . Number of children: 2  . Years of education: N/A   Occupational History  . Retired     Chief Technology Officer job   Social History Main Topics  . Smoking status: Former Smoker    Packs/day: 1.00    Years: 40.00    Quit date: 01/20/2008  . Smokeless tobacco: Never Used  . Alcohol use 0.6 oz/week    1 Standard drinks or equivalent per week     Comment: rare wine  . Drug use: No  . Sexual activity: Yes   Other Topics Concern  . Not on file   Social History Narrative   Retired from Architect   Lives in [place]   Family History Family History  Problem Relation Age of Onset  . Hypertension Mother   . Cancer Mother   . Hyperlipidemia Mother   . Atrial fibrillation Sister   . Stroke Father     Current Outpatient Prescriptions on File Prior to Visit  Medication Sig Dispense Refill  . Ascorbic Acid (VITAMIN C) 1000 MG tablet Take 1,000 mg by mouth daily.    Marland Kitchen aspirin EC 325 MG tablet Take 325 mg by mouth daily.    . cetirizine (ZYRTEC) 10 MG tablet Take 10 mg by mouth at bedtime.     . clopidogrel (PLAVIX) 75 MG tablet Take 1 tablet (75 mg total) by mouth daily. 90 tablet 3  . diltiazem (CARDIZEM CD) 240 MG 24 hr capsule Take 240 mg by mouth at bedtime.     . fluticasone (FLONASE) 50 MCG/ACT nasal spray Place 2 sprays into both nostrils every evening.     . meclizine (ANTIVERT) 25 MG tablet Take 25 mg by mouth 2 (two) times daily as needed. For dizziness    . metoprolol succinate (TOPROL-XL) 25 MG 24 hr tablet Take 0.5 tablets (12.5 mg total) by mouth daily.    . Multiple Vitamin (MULTIVITAMIN) tablet Take 1 tablet by mouth daily.    . pravastatin (PRAVACHOL) 20 MG tablet TAKE ONE TABLET BY MOUTH ONCE EVERY EVENING. 90 tablet 3  . traZODone (DESYREL) 50 MG tablet Take 50 mg by mouth at bedtime.    . triamterene-hydrochlorothiazide (MAXZIDE-25) 37.5-25 MG per  tablet Take 1 tablet by mouth daily.    . Vitamin D, Cholecalciferol, 1000 units TABS Take 1,000 Units by mouth at bedtime.    Marland Kitchen acetaminophen (TYLENOL) 500 MG tablet Take 1,000 mg by mouth every 8 (eight) hours as needed (pain).     No current facility-administered medications on file prior to visit.    Allergies  Allergen Reactions  . Statins     Muscle aches  . Xarelto [Rivaroxaban]     Developed redness in both legs    ROS: See HPI for pertinent positives and negatives.  Physical Examination  Vitals:   06/29/16 [id number]  BP: (!) 137/93  Pulse: 66  Resp: 20  Temp: 97.4 F (36.3 C)  TempSrc: Oral  SpO2: 97%  Weight: 202 lb 14.4 oz (92 kg)  Height: 5\' 9"  (1.753 m)   Body mass index is 29.96 kg/m.  General: A&O x 3, WD, male.  Pulmonary: Sym exp, respirations are non labored, good air movt, CTAB, no rales, rhonchi, or wheezing.  Cardiac: Irregular rhythm,  no detected murmur.   Carotid Bruits Right Left   Negative Negative   Aorta is not palpable Radial pulses are 2+ palpable and =                          VASCULAR EXAM:                                                                                                                                           LE Pulses Right Left       FEMORAL   palpable   palpable        POPLITEAL  3+ palpable   2+ palpable       POSTERIOR TIBIAL   1+palpable    1+palpable        DORSALIS PEDIS   1+ palpable   1+ palpable    Gastrointestinal: soft, NTND, -G/R, - HSM, - masses palpated, - CVAT B.  Musculoskeletal: M/S 5/5 throughout, Extremities without ischemic changes.  Neurologic: CN 2-12 intact except slightly hard of hearing, Pain and light touch intact in extremities are intact, Motor exam as listed above.   10/11/11/ ABI's: At rest, the right ABI is 1.23, left 1.21. Triphasic waveforms are noted throughout both lower extremities on Doppler interrogation. Pulse volume recording is normal  bilaterally. Exercise testing was not performed. IMPRESSION: 1. No evidence of hemodynamically significant lower extremity arterial occlusive disease at rest    CTA abd/pelvis on 06/03/15 requested by Dr. Nevada Crane, to evaluate abdominal pain: IMPRESSION: 1. Acute Cholecystitis. Right Upper Quadrant Ultrasound would be complementary in this setting for treatment planning. 2. No other acute or inflammatory process in the abdomen or pelvis. Normal appendix. 3. Chronic infrarenal abdominal aortic aneurysm is stable since July, maximum diameter 43-44 mm. Major branches of the abdominal aorta appears stable. Ectatic and calcified bilateral iliac arteries are stable. Recommend followup by ultrasound in 1 year. This recommendation follows ACR consensus guidelines: White Paper of the ACR Incidental Findings Committee II on Vascular Findings. J Am Coll Radiol 2013; 10:789-794. 4. Severe colonic diverticulosis with no active inflammation.   Non-Invasive Vascular Imaging  AAA Duplex (06/29/2016)  Previous size: 4.4 cm (Date: 12-23-15) Right CIA: 1.6 cm; Left CIA: 2.1 cm  Current size:  4.45 cm (Date: 06-29-16) Right CIA: 1.56 cm; Left CIA: 2.3 cm  Medical Decision Making  The patient is a 76 y.o. male who presents with asymptomatic AAA with no increase  in size.   Based on this patient's exam and diagnostic studies, the patient will follow up in 6 months  with the following studies: AAA duplex and bilateral popliteal artery duplex.   Consideration for repair of AAA would be made when the size is 5.0 cm, growth > 1 cm/yr, and symptomatic status.  Consideration of common iliac artery aneurysm repair would be made at 3 cm.   I emphasized the importance of maximal medical management including strict control of blood pressure, blood glucose, and lipid levels, antiplatelet agents, obtaining regular exercise, and continued cessation of smoking.   The patient was given information about AAA  including signs, symptoms, treatment, and how to minimize the risk of enlargement and rupture of aneurysms.    The patient was advised to call 911 should the patient experience sudden onset abdominal or back pain.   Thank you for allowing [place] to participate in this patient's care.  Clemon Chambers, RN, MSN, FNP-C [facility] [place] Office: [facility] Physician: Early  06/29/2016, 8:45 AM

## 2016-07-01 NOTE — Addendum Note (Signed)
Addended by: Lianne Cure A on: 07/01/2016 01:57 PM   Modules accepted: Orders

## 2016-07-20 DIAGNOSIS — R7303 Prediabetes: Secondary | ICD-10-CM | POA: Diagnosis not present

## 2016-07-20 DIAGNOSIS — E782 Mixed hyperlipidemia: Secondary | ICD-10-CM | POA: Diagnosis not present

## 2016-07-22 DIAGNOSIS — I10 Essential (primary) hypertension: Secondary | ICD-10-CM | POA: Diagnosis not present

## 2016-07-22 DIAGNOSIS — Q120 Congenital cataract: Secondary | ICD-10-CM | POA: Diagnosis not present

## 2016-07-22 DIAGNOSIS — D181 Lymphangioma, any site: Secondary | ICD-10-CM | POA: Diagnosis not present

## 2016-07-22 DIAGNOSIS — I482 Chronic atrial fibrillation: Secondary | ICD-10-CM | POA: Diagnosis not present

## 2016-07-22 DIAGNOSIS — E782 Mixed hyperlipidemia: Secondary | ICD-10-CM | POA: Diagnosis not present

## 2016-07-22 DIAGNOSIS — Z6830 Body mass index (BMI) 30.0-30.9, adult: Secondary | ICD-10-CM | POA: Diagnosis not present

## 2016-07-22 DIAGNOSIS — R7303 Prediabetes: Secondary | ICD-10-CM | POA: Diagnosis not present

## 2016-07-22 DIAGNOSIS — I719 Aortic aneurysm of unspecified site, without rupture: Secondary | ICD-10-CM | POA: Diagnosis not present

## 2016-09-06 DIAGNOSIS — Z0000 Encounter for general adult medical examination without abnormal findings: Secondary | ICD-10-CM | POA: Diagnosis not present

## 2016-09-13 NOTE — Progress Notes (Signed)
Electrophysiology Office Note   Date:  09/15/2016   ID:  [name], DOB [birth year], MRN [id number]  PCP:  [name], MD  Cardiologist:  Dr [name] Primary Electrophysiologist: Rayann Heman    CC: Watchman follow up   History of Present Illness: [name] is a 76 y.o. male who presents today for 6 month watchman follow up.  Today, he denies symptoms of palpitations, chest pain, shortness of breath, orthopnea, PND, lower extremity edema, claudication, presyncope, syncope, bleeding, or neurologic sequela. The patient is tolerating medications without difficulties and is otherwise without complaint today.    Past Medical History:  Diagnosis Date  . AAA (abdominal aortic aneurysm) ([place])   . Aneurysm ([place])   . Arthritis   . Degenerative joint disease   . History of blood transfusion 12/2007   "bleeding for Warfarin"  . Hyperlipidemia   . Hypertension    Admitted for chest pain and 06/2011-neg stress Myoview,mod. LVH with nl EF on echo; TSH of 3.75  . Meniere's disease   . Nephrolithiasis   . 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 Permanent atrial fibrillation ([place])    a. s/p Watchman  . Renal hemorrhage, right 2009   2009 while anticoagulated with Coumadin; treated with the renal artery embolization   Past Surgical History:  Procedure Laterality Date  . EMBOLIZATION  12/2007   Left renal hematoma, status post angiogram and embolization/notes 09/29/2010  . FRACTURE SURGERY    . LEFT ATRIAL APPENDAGE OCCLUSION N/A 03/18/2016   Procedure: LEFT ATRIAL APPENDAGE OCCLUSION;  Surgeon: [name], MD;  Location: [facility];  Service: Cardiovascular;  Laterality: N/A;  . ORIF FEMUR FRACTURE Right 1960   "2 steel rods in leg"  . PILONIDAL CYST EXCISION    . TEE WITHOUT CARDIOVERSION N/A 03/10/2016   Procedure: TRANSESOPHAGEAL ECHOCARDIOGRAM (TEE);  Surgeon: Josue Hector, MD;  Location: [facility];  Service:  Cardiovascular;  Laterality: N/A;  . TEE WITHOUT CARDIOVERSION N/A 04/30/2016   Procedure: TRANSESOPHAGEAL ECHOCARDIOGRAM (TEE);  Surgeon: Josue Hector, MD;  Location: [facility] ENDOSCOPY;  Service: Cardiovascular;  Laterality: N/A;     Current Outpatient Prescriptions  Medication Sig Dispense Refill  . acetaminophen (TYLENOL) 500 MG tablet Take 1,000 mg by mouth every 8 (eight) hours as needed (pain).    . Ascorbic Acid (VITAMIN C) 1000 MG tablet Take 1,000 mg by mouth daily.    Marland Kitchen aspirin EC 325 MG tablet Take 325 mg by mouth daily.    . cetirizine (ZYRTEC) 10 MG tablet Take 10 mg by mouth at bedtime.     . clopidogrel (PLAVIX) 75 MG tablet Take 1 tablet (75 mg total) by mouth daily. 90 tablet 3  . diltiazem (CARDIZEM CD) 240 MG 24 hr capsule Take 240 mg by mouth at bedtime.     . fluticasone (FLONASE) 50 MCG/ACT nasal spray Place 2 sprays into both nostrils every evening.     . meclizine (ANTIVERT) 25 MG tablet Take 25 mg by mouth 2 (two) times daily as needed. For dizziness    . metoprolol succinate (TOPROL-XL) 25 MG 24 hr tablet Take 0.5 tablets (12.5 mg total) by mouth daily.    . Multiple Vitamin (MULTIVITAMIN) tablet Take 1 tablet by mouth daily.    . pravastatin (PRAVACHOL) 20 MG tablet TAKE ONE TABLET BY MOUTH ONCE EVERY EVENING. 90 tablet 3  . traZODone (DESYREL) 50 MG tablet Take 50 mg by mouth at  bedtime.    . triamterene-hydrochlorothiazide (MAXZIDE-25) 37.5-25 MG per tablet Take 1 tablet by mouth daily.    . Vitamin D, Cholecalciferol, 1000 units TABS Take 1,000 Units by mouth at bedtime.     No current facility-administered medications for this visit.     Allergies:   Statins and Xarelto [rivaroxaban]   Social History:  The patient  reports that he quit smoking about 8 years ago. He has a 40.00 pack-year smoking history. He has never used smokeless tobacco. He reports that he drinks about 0.6 oz of alcohol per week . He reports that he does not use drugs.   Family History:  The  patient's family history includes Atrial fibrillation in his sister; Cancer in his mother; Hyperlipidemia in his mother; Hypertension in his mother; Stroke in his father.    ROS:  Please see the history of present illness.   All other systems are reviewed and negative.    PHYSICAL EXAM: VS:  BP 120/90   Pulse (!) 44   Ht 5\' 9"  (1.753 m)   Wt 207 lb 6.4 oz (94.1 kg)   SpO2 99%   BMI 30.63 kg/m  , BMI Body mass index is 30.63 kg/m. GEN: Well nourished, well developed, in no acute distress  HEENT: normal  Neck: no JVD, carotid bruits, or masses Cardiac: iRRR; no murmurs, rubs, or gallops,no edema  Respiratory:  clear to auscultation bilaterally, normal work of breathing GI: soft, nontender, nondistended, + BS MS: no deformity or atrophy  Skin: warm and dry  Neuro:  Strength and sensation are intact Psych: euthymic mood, full affect  EKG:  EKG is not ordered today.  Recent Labs: 04/04/2016: ALT 17; B Natriuretic Peptide 249.0 04/07/2016: BUN 13; Creatinine, Ser 0.84; Hemoglobin 11.1; Platelets 209; Potassium 3.4; Sodium 134    Lipid Panel     Component Value Date/Time   CHOL 161 12/08/2012 0705   TRIG 165 (H) 12/08/2012 0705   HDL 37 (L) 12/08/2012 0705   CHOLHDL 4.4 12/08/2012 0705   VLDL 33 12/08/2012 0705   LDLCALC 91 12/08/2012 0705     Wt Readings from Last 3 Encounters:  09/15/16 207 lb 6.4 oz (94.1 kg)  06/29/16 202 lb 14.4 oz (92 kg)  05/06/16 204 lb (92.5 kg)      ASSESSMENT AND PLAN:  1.  Persistent afib Doing well s/p Watchman.   Stop Plavix Continue ASA long term   2.  Hypotension Stable No change required today   Current medicines are reviewed at length with the patient today.   The patient does not have concerns regarding his medicines.  The following changes were made today: stop plavix  Follow up with Watchman team as needed, Dr [name] as scheduled   Signed, Chanetta Marshall, NP 09/15/2016 11:45 AM     [address] [place] [id number] [phone] (office) [phone] (fax)

## 2016-09-14 DIAGNOSIS — Z1211 Encounter for screening for malignant neoplasm of colon: Secondary | ICD-10-CM | POA: Diagnosis not present

## 2016-09-14 DIAGNOSIS — Z1212 Encounter for screening for malignant neoplasm of rectum: Secondary | ICD-10-CM | POA: Diagnosis not present

## 2016-09-15 ENCOUNTER — Encounter: Payer: Self-pay | Admitting: Nurse Practitioner

## 2016-09-15 ENCOUNTER — Ambulatory Visit (INDEPENDENT_AMBULATORY_CARE_PROVIDER_SITE_OTHER): Payer: PPO | Admitting: Nurse Practitioner

## 2016-09-15 VITALS — BP 120/90 | HR 44 | Ht 69.0 in | Wt 207.4 lb

## 2016-09-15 DIAGNOSIS — I959 Hypotension, unspecified: Secondary | ICD-10-CM | POA: Diagnosis not present

## 2016-09-15 DIAGNOSIS — I4819 Other persistent atrial fibrillation: Secondary | ICD-10-CM

## 2016-09-15 DIAGNOSIS — I481 Persistent atrial fibrillation: Secondary | ICD-10-CM | POA: Diagnosis not present

## 2016-09-15 NOTE — Patient Instructions (Signed)
Medication Instructions:  Your physician has recommended you make the following change in your medication: Please stop taking the PLAVIX  Labwork: None Ordered   Testing/Procedures: None Ordered   Follow-Up: Your physician recommends that you schedule a follow-up appointment in: As needed with Chanetta Marshall, NP  Any Other Special Instructions Will Be Listed Below (If Applicable).     If you need a refill on your cardiac medications before your next appointment, please call your pharmacy.

## 2016-09-17 DIAGNOSIS — H903 Sensorineural hearing loss, bilateral: Secondary | ICD-10-CM | POA: Diagnosis not present

## 2016-09-17 DIAGNOSIS — H8102 Meniere's disease, left ear: Secondary | ICD-10-CM | POA: Diagnosis not present

## 2016-11-03 DIAGNOSIS — H2513 Age-related nuclear cataract, bilateral: Secondary | ICD-10-CM | POA: Diagnosis not present

## 2016-11-03 DIAGNOSIS — H52221 Regular astigmatism, right eye: Secondary | ICD-10-CM | POA: Diagnosis not present

## 2016-11-03 DIAGNOSIS — H5201 Hypermetropia, right eye: Secondary | ICD-10-CM | POA: Diagnosis not present

## 2016-12-08 DIAGNOSIS — M5441 Lumbago with sciatica, right side: Secondary | ICD-10-CM | POA: Diagnosis not present

## 2016-12-08 DIAGNOSIS — M546 Pain in thoracic spine: Secondary | ICD-10-CM | POA: Diagnosis not present

## 2016-12-08 DIAGNOSIS — M9903 Segmental and somatic dysfunction of lumbar region: Secondary | ICD-10-CM | POA: Diagnosis not present

## 2016-12-08 DIAGNOSIS — M9902 Segmental and somatic dysfunction of thoracic region: Secondary | ICD-10-CM | POA: Diagnosis not present

## 2016-12-08 DIAGNOSIS — M9905 Segmental and somatic dysfunction of pelvic region: Secondary | ICD-10-CM | POA: Diagnosis not present

## 2016-12-10 DIAGNOSIS — M5441 Lumbago with sciatica, right side: Secondary | ICD-10-CM | POA: Diagnosis not present

## 2016-12-10 DIAGNOSIS — M9902 Segmental and somatic dysfunction of thoracic region: Secondary | ICD-10-CM | POA: Diagnosis not present

## 2016-12-10 DIAGNOSIS — M546 Pain in thoracic spine: Secondary | ICD-10-CM | POA: Diagnosis not present

## 2016-12-10 DIAGNOSIS — M9903 Segmental and somatic dysfunction of lumbar region: Secondary | ICD-10-CM | POA: Diagnosis not present

## 2016-12-10 DIAGNOSIS — M9905 Segmental and somatic dysfunction of pelvic region: Secondary | ICD-10-CM | POA: Diagnosis not present

## 2016-12-13 ENCOUNTER — Encounter: Payer: Self-pay | Admitting: Gastroenterology

## 2016-12-13 DIAGNOSIS — M5441 Lumbago with sciatica, right side: Secondary | ICD-10-CM | POA: Diagnosis not present

## 2016-12-13 DIAGNOSIS — M9903 Segmental and somatic dysfunction of lumbar region: Secondary | ICD-10-CM | POA: Diagnosis not present

## 2016-12-13 DIAGNOSIS — M9902 Segmental and somatic dysfunction of thoracic region: Secondary | ICD-10-CM | POA: Diagnosis not present

## 2016-12-13 DIAGNOSIS — M9905 Segmental and somatic dysfunction of pelvic region: Secondary | ICD-10-CM | POA: Diagnosis not present

## 2016-12-13 DIAGNOSIS — M546 Pain in thoracic spine: Secondary | ICD-10-CM | POA: Diagnosis not present

## 2016-12-20 ENCOUNTER — Encounter: Payer: Self-pay | Admitting: Family

## 2016-12-20 DIAGNOSIS — M9903 Segmental and somatic dysfunction of lumbar region: Secondary | ICD-10-CM | POA: Diagnosis not present

## 2016-12-20 DIAGNOSIS — M9902 Segmental and somatic dysfunction of thoracic region: Secondary | ICD-10-CM | POA: Diagnosis not present

## 2016-12-20 DIAGNOSIS — M546 Pain in thoracic spine: Secondary | ICD-10-CM | POA: Diagnosis not present

## 2016-12-20 DIAGNOSIS — M5441 Lumbago with sciatica, right side: Secondary | ICD-10-CM | POA: Diagnosis not present

## 2016-12-20 DIAGNOSIS — M9905 Segmental and somatic dysfunction of pelvic region: Secondary | ICD-10-CM | POA: Diagnosis not present

## 2016-12-28 ENCOUNTER — Ambulatory Visit (INDEPENDENT_AMBULATORY_CARE_PROVIDER_SITE_OTHER)
Admission: RE | Admit: 2016-12-28 | Discharge: 2016-12-28 | Disposition: A | Discharge status: Home / Self Care | Payer: PPO | Source: Ambulatory Visit | Attending: Vascular Surgery | Admitting: Vascular Surgery

## 2016-12-28 ENCOUNTER — Ambulatory Visit (HOSPITAL_COMMUNITY)
Admission: RE | Admit: 2016-12-28 | Discharge: 2016-12-28 | Disposition: A | Discharge status: Home / Self Care | Payer: PPO | Source: Ambulatory Visit | Attending: Family | Admitting: Family

## 2016-12-28 ENCOUNTER — Encounter: Payer: Self-pay | Admitting: Family

## 2016-12-28 ENCOUNTER — Ambulatory Visit (INDEPENDENT_AMBULATORY_CARE_PROVIDER_SITE_OTHER): Payer: PPO | Admitting: Family

## 2016-12-28 VITALS — BP 125/83 | HR 52 | Temp 96.4°F | Resp 20 | Ht 69.0 in | Wt 202.0 lb

## 2016-12-28 DIAGNOSIS — I714 Abdominal aortic aneurysm, without rupture, unspecified: Secondary | ICD-10-CM

## 2016-12-28 DIAGNOSIS — I723 Aneurysm of iliac artery: Secondary | ICD-10-CM | POA: Insufficient documentation

## 2016-12-28 DIAGNOSIS — R0989 Other specified symptoms and signs involving the circulatory and respiratory systems: Secondary | ICD-10-CM | POA: Insufficient documentation

## 2016-12-28 NOTE — Progress Notes (Addendum)
[facility] [place]   CC: Follow up Abdominal Aortic Aneurysm  History of Present Illness  [name] is a 76 y.o. ([birth year]) male patient of Dr. Kellie Simmering returns for continued follow-up regarding an abdominal aortic aneurysm. He recently had nominal discomfort which lasted about 1 hour. He was seen in the emergency department and eventually had CT scan which Dr. Laruth Bouchard at pt's visit on 12/17/14. This revealed the aneurysm to be approximately 4 cm in maximum diameter with no evidence of leakage. Patient is seen today in continued follow-up. He does have some mild claudication type symptoms in both legs after walking several blocks which is not limiting him. Pt's wife states that pt had bleeding from a renal artery in 2009 caused a great deal of internal bleeding, was in ICU for a week, states this artery was cauterized. He was on  Warfarin at that time for a-fib; has not been on warfarin since then. He takes ASA and rate control medications.  He has constant RLQ abdominal pain that has exacerbations and waning pain since about 2016; this has been evaluated with no etiology found.  He has occasional low back pain when he lifts or strains, but no pain referable to AAA.    Pt denies any know hx of stroke or TIA.  He had another CTA abd/pelvis on 06/03/15 requested by Dr. Nevada Crane, pt's PCP, to evaluate abdominal pain, see results below. Dr. Kellie Simmering last saw pt on 12/17/14. At that time CT showed a4 cm abdominal aortic aneurysm which does extend above the level of renal arteries proximally -not a stent graft candidate  Aneurysm has not changed significantly in size over the past few years -approximately 4 cm. He reports occasional right groin pain which he states may be a hernia.  Pt reports chronic low back pain, no new back pain.  The patient reports tired feeling in both calves if he walks about 1/4 mile uphill; states Dr. Lattie Haw requested and pt had ABI's  performed years ago, states this was normal; see normal ABI results under DATA.  Wife states pt's blood pressure at home runs 110-120's/60-70; states he has not yet taken his AM meds as he was NPO for the AAA duplex.   He is taking 325 mg ASA for his atrial fib,  this is managed by Dr. Rayann Heman. Pt had Watchman procedure done in October 2017.   Pt Diabetic: No Pt smoker: former smoker, quit in 2009    Past Medical History:  Diagnosis Date  . AAA (abdominal aortic aneurysm) ([place])   . Aneurysm ([place])   . Arthritis   . Degenerative joint disease   . History of blood transfusion 12/2007   "bleeding for Warfarin"  . Hyperlipidemia   . Hypertension    Admitted for chest pain and 06/2011-neg stress Myoview,mod. LVH with nl EF on echo; TSH of 3.75  . Meniere's disease   . Nephrolithiasis   . 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 Permanent atrial fibrillation ([place])    a. s/p Watchman  . Renal hemorrhage, right 2009   2009 while anticoagulated with Coumadin; treated with the renal artery embolization   Past Surgical History:  Procedure Laterality Date  . EMBOLIZATION  12/2007   Left renal hematoma, status post angiogram and embolization/notes 09/29/2010  . FRACTURE SURGERY    . LEFT ATRIAL APPENDAGE OCCLUSION N/A 03/18/2016   Procedure: LEFT ATRIAL APPENDAGE OCCLUSION;  Surgeon: [name],  MD;  Location: [facility] [facility];  Service: Cardiovascular;  Laterality: N/A;  . ORIF FEMUR FRACTURE Right 1960   "2 steel rods in leg"  . PILONIDAL CYST EXCISION    . TEE WITHOUT CARDIOVERSION N/A 03/10/2016   Procedure: TRANSESOPHAGEAL ECHOCARDIOGRAM (TEE);  Surgeon: Josue Hector, MD;  Location: [facility];  Service: Cardiovascular;  Laterality: N/A;  . TEE WITHOUT CARDIOVERSION N/A 04/30/2016   Procedure: TRANSESOPHAGEAL ECHOCARDIOGRAM (TEE);  Surgeon: Josue Hector, MD;  Location: [facility];  Service: Cardiovascular;  Laterality:  N/A;   Social History Social History   Social History  . Marital status: Married    Spouse name: N/A  . Number of children: 2  . Years of education: N/A   Occupational History  . Retired     Chief Technology Officer job   Social History Main Topics  . Smoking status: Former Smoker    Packs/day: 1.00    Years: 40.00    Quit date: 01/20/2008  . Smokeless tobacco: Never Used  . Alcohol use 0.6 oz/week    1 Standard drinks or equivalent per week     Comment: rare wine  . Drug use: No  . Sexual activity: Yes   Other Topics Concern  . Not on file   Social History Narrative   Retired from Architect   Lives in [place]   Family History Family History  Problem Relation Age of Onset  . Hypertension Mother   . Cancer Mother   . Hyperlipidemia Mother   . Atrial fibrillation Sister   . Stroke Father     Current Outpatient Prescriptions on File Prior to Visit  Medication Sig Dispense Refill  . acetaminophen (TYLENOL) 500 MG tablet Take 1,000 mg by mouth every 8 (eight) hours as needed (pain).    . Ascorbic Acid (VITAMIN C) 1000 MG tablet Take 1,000 mg by mouth daily.    Marland Kitchen aspirin EC 325 MG tablet Take 325 mg by mouth daily.    . cetirizine (ZYRTEC) 10 MG tablet Take 10 mg by mouth at bedtime.     . clopidogrel (PLAVIX) 75 MG tablet Take 1 tablet (75 mg total) by mouth daily. 90 tablet 3  . diltiazem (CARDIZEM CD) 240 MG 24 hr capsule Take 240 mg by mouth at bedtime.     . fluticasone (FLONASE) 50 MCG/ACT nasal spray Place 2 sprays into both nostrils every evening.     . meclizine (ANTIVERT) 25 MG tablet Take 25 mg by mouth 2 (two) times daily as needed. For dizziness    . metoprolol succinate (TOPROL-XL) 25 MG 24 hr tablet Take 0.5 tablets (12.5 mg total) by mouth daily.    . Multiple Vitamin (MULTIVITAMIN) tablet Take 1 tablet by mouth daily.    . pravastatin (PRAVACHOL) 20 MG tablet TAKE ONE TABLET BY MOUTH ONCE EVERY EVENING. 90 tablet 3  . traZODone (DESYREL) 50  MG tablet Take 50 mg by mouth at bedtime.    . triamterene-hydrochlorothiazide (MAXZIDE-25) 37.5-25 MG per tablet Take 1 tablet by mouth daily.    . Vitamin D, Cholecalciferol, 1000 units TABS Take 1,000 Units by mouth at bedtime.     No current facility-administered medications on file prior to visit.    Allergies  Allergen Reactions  . Statins     Muscle aches  . Xarelto [Rivaroxaban]     Developed redness in both legs    ROS: See HPI for pertinent positives and negatives.  Physical Examination  Vitals:   12/28/16 2094  BP: 125/83  Pulse: (!) 52  Resp: 20  Temp: (!) 96.4 F (35.8 C)  TempSrc: Oral  SpO2: 96%  Weight: 202 lb (91.6 kg)  Height: 5\' 9"  (1.753 m)   Body mass index is 29.83 kg/m.  General: A&O x 3, WD, male.  Pulmonary: Sym exp, respirations are non labored, good air movt, CTAB, no rales, rhonchi, or wheezing.  Cardiac: Irregular rhythm, bradycardic,  no detectedmurmur.   Carotid Bruits Right Left   Negative Negative   Abdominal aortic pulse is notpalpable Radial pulses are 2+ palpable and =  VASCULAR EXAM:  LE Pulses Right Left  FEMORAL palpable palpable   POPLITEAL 2+palpable  2+palpable  POSTERIOR TIBIAL 2+palpable  2+palpable        DORSALIS PEDIS   1+ palpable   1+ palpable    Gastrointestinal: soft, NTND, -G/R, - HSM, - masses palpated, - CVAT B.  Musculoskeletal: M/S 5/5 throughout, Extremities without ischemic changes.  Neurologic: CN 2-12 intact except slightly hard of hearing, Pain and light touch intact in extremities are intact, Motor exam as listed above.     DATA  AAA Duplex (12/28/2016)  Previous size: 4.45 cm (Date: 06-29-16) Right CIA: 1.56 cm; Left CIA: 2.3 cm   Current size:  4.43 cm (Date: 12/28/16), Right CIA: 1.44 cm; Left CIA: 2.17 cm   10/11/11/ ABI's: At rest, the right ABI is 1.23, left 1.21. Triphasic waveforms are noted  throughout both lower extremities on Doppler interrogation. Pulse volume recording is normal bilaterally. Exercise testing was not performed. IMPRESSION: 1. No evidence of hemodynamically significant lower extremity arterial occlusive disease at rest   CTA abd/pelvis on 06/03/15 requested by Dr. Nevada Crane, to evaluate abdominal pain: IMPRESSION: 1. Acute Cholecystitis. Right Upper Quadrant Ultrasound would be complementary in this setting for treatment planning. 2. No other acute or inflammatory process in the abdomen or pelvis. Normal appendix. 3. Chronic infrarenal abdominal aortic aneurysm is stable since July, maximum diameter 43-44 mm. Major branches of the abdominal aorta appears stable. Ectatic and calcified bilateral iliac arteries are stable. Recommend followup by ultrasound in 1 year. This recommendation follows ACR consensus guidelines: White Paper of the ACR Incidental Findings Committee II on Vascular Findings. J Am Coll Radiol 2013; 10:789-794. 4. Severe colonic diverticulosis with no active inflammation.    Medical Decision Making  The patient is a 76 y.o. male who presents with asymptomatic AAA with no increase in size. Prominent bilateral popliteal pulses: bilateral poplitea artery duplex (12/28/16) reveals no evidence of popliteal aneurysms.    Based on this patient's exam and diagnostic studies, the patient will follow up in 6 months with the following studies: AAA duplex.  Consideration for repair of AAA would be made when the size is 5.0 cm, growth > 1 cm/yr, and symptomatic status.  Consideration of common iliac artery aneurysm repair would be made at 3 cm.          The patient was given information about AAA including signs, symptoms, treatment, and how to minimize the risk of enlargement and rupture of aneurysms.    I emphasized the importance of maximal medical management including strict control of blood pressure, blood glucose, and lipid levels,  antiplatelet agents, obtaining regular exercise, and continued cessation of smoking.   The patient was advised to call 911 should the patient experience sudden onset abdominal or back pain.   Thank you for allowing [place] to participate in this patient's care.  Vinnie Level Giacomo Valone, RN, MSN, FNP-C [facility]  [place] Office: [phone]  Clinic Physician: Early  12/28/2016, 9:30 AM

## 2016-12-28 NOTE — Patient Instructions (Signed)
Abdominal Aortic Aneurysm Blood pumps away from the heart through tubes (blood vessels) called arteries. Aneurysms are weak or damaged places in the wall of an artery. It bulges out like a balloon. An abdominal aortic aneurysm happens in the main artery of the body (aorta). It can burst or tear, causing bleeding inside the body. This is an emergency. It needs treatment right away. What are the causes? The exact cause is unknown. Things that could cause this problem include:  Fat and other substances building up in the lining of a tube.  Swelling of the walls of a blood vessel.  Certain tissue diseases.  Belly (abdominal) trauma.  An infection in the main artery of the body.  What increases the risk? There are things that make it more likely for you to have an aneurysm. These include:  Being over the age of 74 years old.  Having high blood pressure (hypertension).  Being a male.  Being white.  Being very overweight (obese).  Having a family history of aneurysm.  Using tobacco products.  What are the signs or symptoms? Symptoms depend on the size of the aneurysm and how fast it grows. There may not be symptoms. If symptoms occur, they can include:  Pain (belly, side, lower back, or groin).  Feeling full after eating a small amount of food.  Feeling sick to your stomach (nauseous), throwing up (vomiting), or both.  Feeling a lump in your belly that feels like it is beating (pulsating).  Feeling like you will pass out (faint).  How is this treated?  Medicine to control blood pressure and pain.  Imaging tests to see if the aneurysm gets bigger.  Surgery. How is this prevented? To lessen your chance of getting this condition:  Stop smoking. Stop chewing tobacco.  Limit or avoid alcohol.  Keep your blood pressure, blood sugar, and cholesterol within normal limits.  Eat less salt.  Eat foods low in saturated fats and cholesterol. These are found in animal and  whole dairy products.  Eat more fiber. Fiber is found in whole grains, vegetables, and fruits.  Keep a healthy weight.  Stay active and exercise often.  This information is not intended to replace advice given to you by your health care provider. Make sure you discuss any questions you have with your health care provider. Document Released: 09/11/2012 Document Revised: 10/23/2015 Document Reviewed: 06/16/2012 Elsevier Interactive Patient Education  2017 [employer]  

## 2016-12-29 DIAGNOSIS — M9905 Segmental and somatic dysfunction of pelvic region: Secondary | ICD-10-CM | POA: Diagnosis not present

## 2016-12-29 DIAGNOSIS — M546 Pain in thoracic spine: Secondary | ICD-10-CM | POA: Diagnosis not present

## 2016-12-29 DIAGNOSIS — M9902 Segmental and somatic dysfunction of thoracic region: Secondary | ICD-10-CM | POA: Diagnosis not present

## 2016-12-29 DIAGNOSIS — M5441 Lumbago with sciatica, right side: Secondary | ICD-10-CM | POA: Diagnosis not present

## 2016-12-29 DIAGNOSIS — M9903 Segmental and somatic dysfunction of lumbar region: Secondary | ICD-10-CM | POA: Diagnosis not present

## 2017-01-04 ENCOUNTER — Ambulatory Visit (INDEPENDENT_AMBULATORY_CARE_PROVIDER_SITE_OTHER): Payer: PPO | Admitting: Gastroenterology

## 2017-01-04 ENCOUNTER — Encounter: Payer: Self-pay | Admitting: Gastroenterology

## 2017-01-04 ENCOUNTER — Other Ambulatory Visit: Payer: Self-pay

## 2017-01-04 VITALS — BP 136/89 | HR 57 | Temp 97.8°F | Ht 69.0 in | Wt 205.0 lb

## 2017-01-04 DIAGNOSIS — R195 Other fecal abnormalities: Secondary | ICD-10-CM | POA: Diagnosis not present

## 2017-01-04 DIAGNOSIS — R1310 Dysphagia, unspecified: Secondary | ICD-10-CM

## 2017-01-04 MED ORDER — CLENPIQ 10-3.5-12 MG-GM -GM/160ML PO SOLN: 1.0000 | Freq: Once | ORAL | 0 refills | Status: AC

## 2017-01-04 MED ORDER — PANTOPRAZOLE SODIUM 40 MG PO TBEC: 40.0000 mg | DELAYED_RELEASE_TABLET | Freq: Every day | ORAL | 3 refills | Status: DC

## 2017-01-04 NOTE — Patient Instructions (Addendum)
We have scheduled a colonoscopy, upper endoscopy, and dilation  with Dr. Oneida Alar in the near future.   I have sent in Protonix to your pharmacy to take 30 minutes prior to breakfast. This is for reflux.   Make sure you chew well, take small bites.

## 2017-01-04 NOTE — Progress Notes (Addendum)
REVIEWED-NO ADDITIONAL RECOMMENDATIONS.   Primary Care Physician:  Celene Squibb, MD Primary Gastroenterologist:  Dr. Oneida Alar   Chief Complaint  Patient presents with  . New Patient (Initial Visit)    HPI:   [name] is a 76 y.o. male presenting today at the request of Dr. Nevada Crane secondary to positive cologaurda.   No rectal bleeding. Will sometimes have constipation. Will be "tight" and then "loose". Will skip a day here and there. Occasional straining. Some vague lower abdominal discomfort intermittently. Occasional reflux. Once in awhile will have food "hung" in esophagus; however, his wife states he eats large pieces of meat and eats fast. No PPI.   Hgb 13.8 in Feb 2018 at Dr. Juel Burrow office. Wife, [name], present.   Past Medical History:  Diagnosis Date  . AAA (abdominal aortic aneurysm) ([place])   . Aneurysm ([place])   . Arthritis   . Degenerative joint disease   . History of blood transfusion 12/2007   "bleeding for Warfarin"  . Hyperlipidemia   . Hypertension    Admitted for chest pain and 06/2011-neg stress Myoview,mod. LVH with nl EF on echo; TSH of 3.75  . Meniere's disease   . Nephrolithiasis   . Peripheral vascular disease ([place])    Small AAA (3.7-4cm 09/2010), small SMA aneurysm (48mm)  . Permanent atrial fibrillation ([place])    a. s/p Watchman  . Renal hemorrhage, right 2009   2009 while anticoagulated with Coumadin; treated with the renal artery embolization    Past Surgical History:  Procedure Laterality Date  . EMBOLIZATION  12/2007   Left renal hematoma, status post angiogram and embolization/notes 09/29/2010  . FRACTURE SURGERY    . LEFT ATRIAL APPENDAGE OCCLUSION N/A 03/18/2016   Procedure: LEFT ATRIAL APPENDAGE OCCLUSION;  Surgeon: [name], MD;  Location: [place] [facility];  Service: Cardiovascular;  Laterality: N/A;  . ORIF FEMUR FRACTURE Right 1960   "2 steel rods in leg"  . PILONIDAL CYST EXCISION    . TEE WITHOUT CARDIOVERSION N/A 03/10/2016   Procedure: TRANSESOPHAGEAL ECHOCARDIOGRAM (TEE);  Surgeon: Josue Hector, MD;  Location: [place];  Service: Cardiovascular;  Laterality: N/A;  . TEE WITHOUT CARDIOVERSION N/A 04/30/2016   Procedure: TRANSESOPHAGEAL ECHOCARDIOGRAM (TEE);  Surgeon: Josue Hector, MD;  Location: [facility];  Service: Cardiovascular;  Laterality: N/A;  . Watchman procedure  02/2016    Current Outpatient Prescriptions  Medication Sig Dispense Refill  . acetaminophen (TYLENOL) 500 MG tablet Take 1,000 mg by mouth every 8 (eight) hours as needed (pain).    . Ascorbic Acid (VITAMIN C) 1000 MG tablet Take 1,000 mg by mouth daily.    Marland Kitchen aspirin EC 325 MG tablet Take 325 mg by mouth daily.    . cetirizine (ZYRTEC) 10 MG tablet Take 10 mg by mouth at bedtime.     Marland Kitchen diltiazem (CARDIZEM CD) 240 MG 24 hr capsule Take 240 mg by mouth at bedtime.     . fluticasone (FLONASE) 50 MCG/ACT nasal spray Place 2 sprays into both nostrils every evening.     . meclizine (ANTIVERT) 25 MG tablet Take 25 mg by mouth 2 (two) times daily as needed. For dizziness    . metoprolol succinate (TOPROL-XL) 25 MG 24 hr tablet Take 0.5 tablets (12.5 mg total) by mouth daily.    . Multiple Vitamin (MULTIVITAMIN) tablet Take 1 tablet by mouth daily.    . pravastatin (PRAVACHOL) 20 MG tablet TAKE ONE TABLET BY MOUTH ONCE EVERY EVENING. 90 tablet 3  .  traZODone (DESYREL) 50 MG tablet Take 50 mg by mouth at bedtime.    . triamterene-hydrochlorothiazide (MAXZIDE-25) 37.5-25 MG per tablet Take 1 tablet by mouth daily.    . Vitamin D, Cholecalciferol, 1000 units TABS Take 1,000 Units by mouth at bedtime.    . pantoprazole (PROTONIX) 40 MG tablet Take 1 tablet (40 mg total) by mouth daily. Take 30 minutes before breakfast 30 tablet 3   No current facility-administered medications for this visit.     Allergies as of 01/04/2017 - Review Complete 01/04/2017  Allergen Reaction Noted  . Statins  05/16/2012  . Xarelto [rivaroxaban]  03/18/2016     Family History  Problem Relation Age of Onset  . Hypertension Mother   . Hyperlipidemia Mother   . Renal cancer Mother   . Atrial fibrillation Sister   . Stroke Father   . Colon cancer Neg Hx     Social History   Social History  . Marital status: Married    Spouse name: N/A  . Number of children: 2  . Years of education: N/A   Occupational History  . Retired     Chief Technology Officer job   Social History Main Topics  . Smoking status: Former Smoker    Packs/day: 1.00    Years: 40.00    Quit date: 01/20/2008  . Smokeless tobacco: Never Used  . Alcohol use 0.6 oz/week    1 Standard drinks or equivalent per week     Comment: rare wine  . Drug use: No  . Sexual activity: Yes   Other Topics Concern  . Not on file   Social History Narrative   Retired from Architect   Lives in [place]: Gen: Denies any fever, chills, fatigue, weight loss, lack of appetite.  CV: Denies chest pain, heart palpitations, peripheral edema, syncope.  Resp: +DOE  GI: see HPI  GU : Denies urinary burning, urinary frequency, urinary hesitancy MS: +joint pain  Derm: Denies rash, itching, dry skin Psych: Denies depression, anxiety, memory loss, and confusion Heme: see HPI   Physical Exam: BP 136/89   Pulse (!) 57   Temp 97.8 F (36.6 C) (Oral)   Ht 5\' 9"  (1.753 m)   Wt 205 lb (93 kg)   BMI 30.27 kg/m  General:   Alert and oriented. Pleasant and cooperative. Well-nourished and well-developed.  Head:  Normocephalic and atraumatic. Eyes:  Without icterus, sclera clear and conjunctiva pink.  Ears:  Normal auditory acuity. Nose:  No deformity, discharge,  or lesions. Mouth:  No deformity or lesions, oral mucosa pink.  Lungs:  Clear to auscultation bilaterally.  Heart:  S1, S2 present without murmurs appreciated.  Abdomen:  +BS, soft, non-tender and non-distended. No HSM noted. No guarding or rebound. No masses appreciated.  Rectal:  Deferred  Msk:   Symmetrical without gross deformities. Normal posture. Extremities:  Without  edema. Neurologic:  Alert and  oriented x4 Psych:  Alert and cooperative. Normal mood and affect.

## 2017-01-04 NOTE — Addendum Note (Signed)
Addended by: Lianne Cure A on: 01/04/2017 04:39 PM   Modules accepted: Orders

## 2017-01-07 NOTE — Patient Instructions (Signed)
PA for TCS/EGD/DIL submitted via [employer]. Case suspended. Outpatient authorization# [id number], 01/07/17-04/07/17.

## 2017-01-07 NOTE — Assessment & Plan Note (Signed)
In setting of reflux, not on PPI. Occasional solid food dysphagia; however, this could be related to dietary/behavior. Will pursue EGD at time of TCS.  Proceed with upper endoscopy/dilation in the near future with Dr. Oneida Alar. The risks, benefits, and alternatives have been discussed in detail with patient. They have stated understanding and desire to proceed.  Protonix once each morning sent to pharmacy

## 2017-01-07 NOTE — Assessment & Plan Note (Signed)
76 year old male with no prior colonoscopy, now with need for diagnostic colonoscopy in setting of positive cologuard. No overt GI Bleeding. Hgb 13.8 recently.  Proceed with colonoscopy with Dr. Oneida Alar in the near future. The risks, benefits, and alternatives have been discussed in detail with the patient. They state understanding and desire to proceed.

## 2017-01-10 ENCOUNTER — Other Ambulatory Visit: Payer: Self-pay | Admitting: Internal Medicine

## 2017-01-10 NOTE — Patient Instructions (Signed)
Received fax from [facility]. PA# [id number] for TCS/EGD/DIL 01/07/17-04/07/17.

## 2017-01-10 NOTE — Progress Notes (Signed)
cc'd to pcp 

## 2017-01-17 DIAGNOSIS — I10 Essential (primary) hypertension: Secondary | ICD-10-CM | POA: Diagnosis not present

## 2017-01-17 DIAGNOSIS — R7301 Impaired fasting glucose: Secondary | ICD-10-CM | POA: Diagnosis not present

## 2017-01-29 DIAGNOSIS — D509 Iron deficiency anemia, unspecified: Secondary | ICD-10-CM | POA: Diagnosis not present

## 2017-01-29 DIAGNOSIS — E119 Type 2 diabetes mellitus without complications: Secondary | ICD-10-CM | POA: Diagnosis not present

## 2017-01-29 DIAGNOSIS — Z125 Encounter for screening for malignant neoplasm of prostate: Secondary | ICD-10-CM | POA: Diagnosis not present

## 2017-01-29 DIAGNOSIS — Z79899 Other long term (current) drug therapy: Secondary | ICD-10-CM | POA: Diagnosis not present

## 2017-01-29 DIAGNOSIS — I482 Chronic atrial fibrillation: Secondary | ICD-10-CM | POA: Diagnosis not present

## 2017-01-29 DIAGNOSIS — K700 Alcoholic fatty liver: Secondary | ICD-10-CM | POA: Diagnosis not present

## 2017-01-29 DIAGNOSIS — D518 Other vitamin B12 deficiency anemias: Secondary | ICD-10-CM | POA: Diagnosis not present

## 2017-01-29 DIAGNOSIS — R739 Hyperglycemia, unspecified: Secondary | ICD-10-CM | POA: Diagnosis not present

## 2017-01-29 DIAGNOSIS — E039 Hypothyroidism, unspecified: Secondary | ICD-10-CM | POA: Diagnosis not present

## 2017-01-29 DIAGNOSIS — I10 Essential (primary) hypertension: Secondary | ICD-10-CM | POA: Diagnosis not present

## 2017-01-29 DIAGNOSIS — Z1211 Encounter for screening for malignant neoplasm of colon: Secondary | ICD-10-CM | POA: Diagnosis not present

## 2017-01-29 DIAGNOSIS — E559 Vitamin D deficiency, unspecified: Secondary | ICD-10-CM | POA: Diagnosis not present

## 2017-01-29 DIAGNOSIS — E782 Mixed hyperlipidemia: Secondary | ICD-10-CM | POA: Diagnosis not present

## 2017-01-29 DIAGNOSIS — N529 Male erectile dysfunction, unspecified: Secondary | ICD-10-CM | POA: Diagnosis not present

## 2017-01-29 DIAGNOSIS — R7301 Impaired fasting glucose: Secondary | ICD-10-CM | POA: Diagnosis not present

## 2017-02-21 ENCOUNTER — Encounter (HOSPITAL_COMMUNITY)
Admission: RE | Disposition: A | Discharge status: Home / Self Care | Payer: Self-pay | Source: Ambulatory Visit | Attending: Gastroenterology

## 2017-02-21 ENCOUNTER — Ambulatory Visit: Payer: PPO | Admitting: Cardiovascular Disease

## 2017-02-21 ENCOUNTER — Ambulatory Visit (HOSPITAL_COMMUNITY)
Admission: RE | Admit: 2017-02-21 | Discharge: 2017-02-21 | Disposition: A | Discharge status: Home / Self Care | Payer: PPO | Source: Ambulatory Visit | Attending: Gastroenterology | Admitting: Gastroenterology

## 2017-02-21 ENCOUNTER — Encounter (HOSPITAL_COMMUNITY): Payer: Self-pay | Admitting: Gastroenterology

## 2017-02-21 DIAGNOSIS — I10 Essential (primary) hypertension: Secondary | ICD-10-CM | POA: Diagnosis not present

## 2017-02-21 DIAGNOSIS — Z7951 Long term (current) use of inhaled steroids: Secondary | ICD-10-CM | POA: Diagnosis not present

## 2017-02-21 DIAGNOSIS — I739 Peripheral vascular disease, unspecified: Secondary | ICD-10-CM | POA: Diagnosis not present

## 2017-02-21 DIAGNOSIS — R195 Other fecal abnormalities: Secondary | ICD-10-CM | POA: Diagnosis not present

## 2017-02-21 DIAGNOSIS — Z87891 Personal history of nicotine dependence: Secondary | ICD-10-CM | POA: Diagnosis not present

## 2017-02-21 DIAGNOSIS — D128 Benign neoplasm of rectum: Secondary | ICD-10-CM | POA: Diagnosis not present

## 2017-02-21 DIAGNOSIS — R1310 Dysphagia, unspecified: Secondary | ICD-10-CM

## 2017-02-21 DIAGNOSIS — Z888 Allergy status to other drugs, medicaments and biological substances status: Secondary | ICD-10-CM | POA: Insufficient documentation

## 2017-02-21 DIAGNOSIS — I714 Abdominal aortic aneurysm, without rupture: Secondary | ICD-10-CM | POA: Diagnosis not present

## 2017-02-21 DIAGNOSIS — Z823 Family history of stroke: Secondary | ICD-10-CM | POA: Insufficient documentation

## 2017-02-21 DIAGNOSIS — Z8249 Family history of ischemic heart disease and other diseases of the circulatory system: Secondary | ICD-10-CM | POA: Diagnosis not present

## 2017-02-21 DIAGNOSIS — D125 Benign neoplasm of sigmoid colon: Secondary | ICD-10-CM | POA: Diagnosis not present

## 2017-02-21 DIAGNOSIS — Z79899 Other long term (current) drug therapy: Secondary | ICD-10-CM | POA: Insufficient documentation

## 2017-02-21 DIAGNOSIS — K5730 Diverticulosis of large intestine without perforation or abscess without bleeding: Secondary | ICD-10-CM | POA: Diagnosis not present

## 2017-02-21 DIAGNOSIS — E785 Hyperlipidemia, unspecified: Secondary | ICD-10-CM | POA: Insufficient documentation

## 2017-02-21 DIAGNOSIS — K2970 Gastritis, unspecified, without bleeding: Secondary | ICD-10-CM | POA: Insufficient documentation

## 2017-02-21 DIAGNOSIS — K621 Rectal polyp: Secondary | ICD-10-CM | POA: Insufficient documentation

## 2017-02-21 DIAGNOSIS — Z7982 Long term (current) use of aspirin: Secondary | ICD-10-CM | POA: Diagnosis not present

## 2017-02-21 DIAGNOSIS — K648 Other hemorrhoids: Secondary | ICD-10-CM | POA: Insufficient documentation

## 2017-02-21 DIAGNOSIS — K222 Esophageal obstruction: Secondary | ICD-10-CM | POA: Diagnosis not present

## 2017-02-21 DIAGNOSIS — D123 Benign neoplasm of transverse colon: Secondary | ICD-10-CM | POA: Diagnosis not present

## 2017-02-21 DIAGNOSIS — K3189 Other diseases of stomach and duodenum: Secondary | ICD-10-CM | POA: Diagnosis not present

## 2017-02-21 HISTORY — PX: COLONOSCOPY: SHX5424

## 2017-02-21 HISTORY — PX: ESOPHAGOGASTRODUODENOSCOPY: SHX5428

## 2017-02-21 HISTORY — PX: BIOPSY: SHX5522

## 2017-02-21 HISTORY — PX: POLYPECTOMY: SHX5525

## 2017-02-21 HISTORY — PX: SAVORY DILATION: SHX5439

## 2017-02-21 SURGERY — COLONOSCOPY
Anesthesia: Moderate Sedation

## 2017-02-21 MED ORDER — SPOT INK MARKER SYRINGE KIT
PACK | SUBMUCOSAL | Status: AC
  Filled 2017-02-21: qty 5

## 2017-02-21 MED ORDER — SODIUM CHLORIDE 0.9 % IJ SOLN
INTRAMUSCULAR | Status: DC | PRN
  Administered 2017-02-21: 2 mL

## 2017-02-21 MED ORDER — MIDAZOLAM HCL 5 MG/5ML IJ SOLN
INTRAMUSCULAR | Status: DC | PRN
  Administered 2017-02-21 (×3): 2 mg via INTRAVENOUS
  Administered 2017-02-21: 1 mg via INTRAVENOUS

## 2017-02-21 MED ORDER — MIDAZOLAM HCL 5 MG/5ML IJ SOLN
INTRAMUSCULAR | Status: AC
  Filled 2017-02-21: qty 10

## 2017-02-21 MED ORDER — SODIUM CHLORIDE 0.9 % IV SOLN
INTRAVENOUS | Status: DC
  Administered 2017-02-21: 12:00:00 via INTRAVENOUS

## 2017-02-21 MED ORDER — LIDOCAINE VISCOUS 2 % MT SOLN
OROMUCOSAL | Status: AC
  Filled 2017-02-21: qty 15

## 2017-02-21 MED ORDER — MINERAL OIL PO OIL
TOPICAL_OIL | ORAL | Status: AC
  Filled 2017-02-21: qty 30

## 2017-02-21 MED ORDER — LIDOCAINE VISCOUS 2 % MT SOLN
OROMUCOSAL | Status: DC | PRN
  Administered 2017-02-21: 4 mL via OROMUCOSAL

## 2017-02-21 MED ORDER — SPOT INK MARKER SYRINGE KIT
PACK | SUBMUCOSAL | Status: DC | PRN
  Administered 2017-02-21: 1 mL via SUBMUCOSAL

## 2017-02-21 MED ORDER — MEPERIDINE HCL 100 MG/ML IJ SOLN
INTRAMUSCULAR | Status: DC | PRN
  Administered 2017-02-21 (×5): 25 mg via INTRAVENOUS

## 2017-02-21 MED ORDER — SIMETHICONE 40 MG/0.6ML PO SUSP
ORAL | Status: DC | PRN
  Administered 2017-02-21: 13:00:00

## 2017-02-21 MED ORDER — EPINEPHRINE PF 1 MG/10ML IJ SOSY
PREFILLED_SYRINGE | INTRAMUSCULAR | Status: AC
  Filled 2017-02-21: qty 10

## 2017-02-21 MED ORDER — MEPERIDINE HCL 100 MG/ML IJ SOLN
INTRAMUSCULAR | Status: AC
  Filled 2017-02-21: qty 2

## 2017-02-21 NOTE — Discharge Instructions (Signed)
You had ONE large SIGMOID COLON POLYP AND FIVE SMALL polyps removed. I PLACED TWO CLIPS TO PREVENT BLEEDING IN 7-10 DAYS.  YOU HAVE GASTRITIS. 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 YOU have A REDUNDANT LEFT COLON. 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   NO MRI FOR 30 DAYS DUE TO METAL CLIPs PLACED IN THE COLON.   DRINK WATER TO KEEP YOUR URINE LIGHT YELLOW.  FOLLOW A HIGH FIBER DIET. AVOID ITEMS THAT CAUSE BLOATING. SEE INFO BELOW.  YOUR BIOPSY RESULTS WILL BE AVAILABLE IN MY CHART AFTER SEP 27 AND MY OFFICE WILL CONTACT YOU IN 10-14 DAYS WITH YOUR RESULTS.   Follow up in 3 mos.  Next colonoscopy in 1-3 years.   ENDOSCOPY Care After Read the instructions outlined below and refer to this sheet in the next week. These discharge instructions provide you with general information on caring for yourself after you leave the hospital. While your treatment has been planned according to the most current medical practices available, unavoidable complications occasionally occur. If you have any problems or questions after discharge, call DR. Capria Cartaya, [phone].  ACTIVITY  You may resume your regular activity, but move at a slower pace for the next 24 hours.   Take frequent rest periods for the next 24 hours.   Walking will help get rid of the air and reduce the bloated feeling in your belly (abdomen).   No driving for 24 hours (because of the medicine (anesthesia) used during the test).   You may shower.   Do not sign any important legal documents or operate any machinery for 24 hours (because of the anesthesia used during the test).    NUTRITION  Drink plenty of fluids.   You may resume your normal diet as instructed by your doctor.   Begin with a light meal and progress to your normal diet. Heavy or fried foods are harder to digest and may make you feel sick to your stomach (nauseated).   Avoid  alcoholic beverages for 24 hours or as instructed.    MEDICATIONS  You may resume your normal medications.   WHAT YOU CAN EXPECT TODAY  Some feelings of bloating in the abdomen.   Passage of more gas than usual.   Spotting of blood in your stool or on the toilet paper  .  IF YOU HAD POLYPS REMOVED DURING THE ENDOSCOPY:  Eat a soft diet IF YOU HAVE NAUSEA, BLOATING, ABDOMINAL PAIN, OR VOMITING.    FINDING OUT THE RESULTS OF YOUR TEST Not all test results are available during your visit. DR. Oneida Alar WILL CALL YOU WITHIN 14 DAYS OF YOUR PROCEDUE WITH YOUR RESULTS. Do not assume everything is normal if you have not heard from DR. Dayquan Buys, CALL HER OFFICE AT [phone].  SEEK IMMEDIATE MEDICAL ATTENTION AND CALL THE OFFICE: [phone] IF:  You have more than a spotting of blood in your stool.   Your belly is swollen (abdominal distention).   You are nauseated or vomiting.   You have a temperature over 101F.   You have abdominal pain or discomfort that is severe or gets worse throughout the day.   High-Fiber Diet A high-fiber diet changes your normal diet to include more whole grains, legumes, fruits, and vegetables. Changes in the diet involve replacing refined carbohydrates with unrefined foods. The calorie level of the diet is essentially unchanged. The Dietary Reference Intake (recommended amount) for adult males is 38 grams  per day. For adult females, it is 25 grams per day. Pregnant and lactating women should consume 28 grams of fiber per day. Fiber is the intact part of a plant that is not broken down during digestion. Functional fiber is fiber that has been isolated from the plant to provide a beneficial effect in the body. PURPOSE  Increase stool bulk.   Ease and regulate bowel movements.   Lower cholesterol.  REDUCE RISK OF COLON CANCER  INDICATIONS THAT YOU NEED MORE FIBER  Constipation and hemorrhoids.   Uncomplicated diverticulosis (intestine condition)  and irritable bowel syndrome.   Weight management.   As a protective measure against hardening of the arteries (atherosclerosis), diabetes, and cancer.   GUIDELINES FOR INCREASING FIBER IN THE DIET  Start adding fiber to the diet slowly. A gradual increase of about 5 more grams (2 slices of whole-wheat bread, 2 servings of most fruits or vegetables, or 1 bowl of high-fiber cereal) per day is best. Too rapid an increase in fiber may result in constipation, flatulence, and bloating.   Drink enough water and fluids to keep your urine clear or pale yellow. Water, juice, or caffeine-free drinks are recommended. Not drinking enough fluid may cause constipation.   Eat a variety of high-fiber foods rather than one type of fiber.   Try to increase your intake of fiber through using high-fiber foods rather than fiber pills or supplements that contain small amounts of fiber.   The goal is to change the types of food eaten. Do not supplement your present diet with high-fiber foods, but replace foods in your present diet.   INCLUDE A VARIETY OF FIBER SOURCES  Replace refined and processed grains with whole grains, canned fruits with fresh fruits, and incorporate other fiber sources. White rice, white breads, and most bakery goods contain little or no fiber.   Brown whole-grain rice, buckwheat oats, and many fruits and vegetables are all good sources of fiber. These include: broccoli, Brussels sprouts, cabbage, cauliflower, beets, sweet potatoes, white potatoes (skin on), carrots, tomatoes, eggplant, squash, berries, fresh fruits, and dried fruits.   Cereals appear to be the richest source of fiber. Cereal fiber is found in whole grains and bran. Bran is the fiber-rich outer coat of cereal grain, which is largely removed in refining. In whole-grain cereals, the bran remains. In breakfast cereals, the largest amount of fiber is found in those with "bran" in their names. The fiber content is sometimes  indicated on the label.   You may need to include additional fruits and vegetables each day.   In baking, for 1 cup white flour, you may use the following substitutions:   1 cup whole-wheat flour minus 2 tablespoons.   1/2 cup white flour plus 1/2 cup whole-wheat flour.    Gastritis  Gastritis is an inflammation (the body's way of reacting to injury and/or infection) of the stomach. It is often caused by viral or bacterial (germ) infections. It can also be caused BY ASPIRIN, BC/GOODY POWDER'S, (IBUPROFEN) MOTRIN, OR ALEVE (NAPROXEN), chemicals (including alcohol), SPICY FOODS, and medications. This illness may be associated with generalized malaise (feeling tired, not well), UPPER ABDOMINAL STOMACH cramps, and fever. One common bacterial cause of gastritis is an organism known as H. Pylori. This can be treated with antibiotics.   Polyps, Colon  A polyp is extra tissue that grows inside your body. Colon polyps grow in the large intestine. The large intestine, also called the colon, is part of your digestive system. It is  a long, hollow tube at the end of your digestive tract where your body makes and stores stool. Most polyps are not dangerous. They are benign. This means they are not cancerous. But over time, some types of polyps can turn into cancer. Polyps that are smaller than a pea are usually not harmful. But larger polyps could someday become or may already be cancerous. To be safe, doctors remove all polyps and test them.   PREVENTION There is not one sure way to prevent polyps. You might be able to lower your risk of getting them if you:  Eat more fruits and vegetables and less fatty food.   Do not smoke.   Avoid alcohol.   Exercise every day.   Lose weight if you are overweight.   Eating more calcium and folate can also lower your risk of getting polyps. Some foods that are rich in calcium are milk, cheese, and broccoli. Some foods that are rich in folate are chickpeas, kidney  beans, and spinach.   Diverticulosis Diverticulosis is a common condition that develops when small pouches (diverticula) form in the wall of the colon. The risk of diverticulosis increases with age. It happens more often in people who eat a low-fiber diet. Most individuals with diverticulosis have no symptoms. Those individuals with symptoms usually experience belly (abdominal) pain, constipation, or loose stools (diarrhea).  HOME CARE INSTRUCTIONS Increase the amount of fiber in your diet as directed by your caregiver or dietician. This may reduce symptoms of diverticulosis.  Drink at least 6 to 8 glasses of water each day to prevent constipation.  Try not to strain when you have a bowel movement.  Avoiding nuts and seeds to prevent complications is NOT NECESSARY.  FOODS HAVING HIGH FIBER CONTENT INCLUDE: Fruits. Apple, peach, pear, tangerine, raisins, prunes.  Vegetables. Brussels sprouts, asparagus, broccoli, cabbage, carrot, cauliflower, romaine lettuce, spinach, summer squash, tomato, winter squash, zucchini.  Starchy Vegetables. Baked beans, kidney beans, lima beans, split peas, lentils, potatoes (with skin).  Grains. Whole wheat bread, brown rice, bran flake cereal, plain oatmeal, white rice, shredded wheat, bran muffins.   SEEK IMMEDIATE MEDICAL CARE IF: You develop increasing pain or severe bloating.  You have an oral temperature above 101F.  You develop vomiting or bowel movements that are bloody or black.    Hemorrhoids Hemorrhoids are dilated (enlarged) veins around the rectum. Sometimes clots will form in the veins. This makes them swollen and painful. These are called thrombosed hemorrhoids. Causes of hemorrhoids include:  Constipation.   Straining to have a bowel movement.   HEAVY LIFTING   HOME CARE INSTRUCTIONS  Eat a well balanced diet and drink 6 to 8 glasses of water every day to avoid constipation. You may also use a bulk laxative.   Avoid straining to have  bowel movements.   Keep anal area dry and clean.   Do not use a donut shaped pillow or sit on the toilet for long periods. This increases blood pooling and pain.   Move your bowels when your body has the urge; this will require less straining and will decrease pain and pressure.

## 2017-02-21 NOTE — H&P (Signed)
Primary Care Physician:  Celene Squibb, MD Primary Gastroenterologist:  Dr. Oneida Alar  Pre-Procedure History & Physical: HPI:  [name] is a 76 y.o. male here for DYSPHAGIA/ POS COLOGUARD TEST.   Past Medical History:  Diagnosis Date  . AAA (abdominal aortic aneurysm) ([place])   . Aneurysm ([place])   . Arthritis   . Degenerative joint disease   . History of blood transfusion 12/2007   "bleeding for Warfarin"  . Hyperlipidemia   . Hypertension    Admitted for chest pain and 06/2011-neg stress Myoview,mod. LVH with nl EF on echo; TSH of 3.75  . Meniere's disease   . Nephrolithiasis   . 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 Permanent atrial fibrillation ([place])    a. s/p Watchman  . Renal hemorrhage, right 2009   2009 while anticoagulated with Coumadin; treated with the renal artery embolization    Past Surgical History:  Procedure Laterality Date  . EMBOLIZATION  12/2007   Left renal hematoma, status post angiogram and embolization/notes 09/29/2010  . FRACTURE SURGERY    . LEFT ATRIAL APPENDAGE OCCLUSION N/A 03/18/2016   Procedure: LEFT ATRIAL APPENDAGE OCCLUSION;  Surgeon: [name], MD;  Location: [facility];  Service: Cardiovascular;  Laterality: N/A;  . ORIF FEMUR FRACTURE Right 1960   "2 steel rods in leg"  . PILONIDAL CYST EXCISION    . TEE WITHOUT CARDIOVERSION N/A 03/10/2016   Procedure: TRANSESOPHAGEAL ECHOCARDIOGRAM (TEE);  Surgeon: Josue Hector, MD;  Location: [facility];  Service: Cardiovascular;  Laterality: N/A;  . TEE WITHOUT CARDIOVERSION N/A 04/30/2016   Procedure: TRANSESOPHAGEAL ECHOCARDIOGRAM (TEE);  Surgeon: Josue Hector, MD;  Location: [facility];  Service: Cardiovascular;  Laterality: N/A;  . Watchman procedure  02/2016    Prior to Admission medications   Medication Sig Start Date End Date Taking? Authorizing Provider  acetaminophen (TYLENOL) 500 MG tablet Take 1,000 mg by mouth every  8 (eight) hours as needed (pain).   Yes [provider]  Ascorbic Acid (VITAMIN C) 1000 MG tablet Take 1,000 mg by mouth daily.   Yes [provider]  aspirin EC 325 MG tablet Take 325 mg by mouth daily.   Yes [provider]  cetirizine (ZYRTEC) 10 MG tablet Take 10 mg by mouth at bedtime.    Yes [provider]  diltiazem (CARDIZEM CD) 240 MG 24 hr capsule Take 240 mg by mouth at bedtime.    Yes [provider]  fluticasone (FLONASE) 50 MCG/ACT nasal spray Place 1 spray into both nostrils 2 (two) times daily.  02/23/16  Yes [provider]  meclizine (ANTIVERT) 25 MG tablet Take 25 mg by mouth 2 (two) times daily. For dizziness   Yes [provider]  metoprolol succinate (TOPROL-XL) 25 MG 24 hr tablet Take 0.5 tablets (12.5 mg total) by mouth daily. 03/25/16  Yes Allred, Jeneen Rinks, MD  Multiple Vitamin (MULTIVITAMIN) tablet Take 1 tablet by mouth daily.   Yes [provider]  pantoprazole (PROTONIX) 40 MG tablet Take 1 tablet (40 mg total) by mouth daily. Take 30 minutes before breakfast 01/04/17  Yes Annitta Needs, NP  pravastatin (PRAVACHOL) 20 MG tablet TAKE ONE TABLET BY MOUTH ONCE EVERY EVENING. 04/03/13  Yes Lendon Colonel, NP  traZODone (DESYREL) 50 MG tablet Take 50 mg by mouth at bedtime.   Yes [provider]  triamterene-hydrochlorothiazide (MAXZIDE-25) 37.5-25 MG per tablet Take 1 tablet by  mouth daily. 09/20/11  Yes RothbartCristopher Estimable, MD  Vitamin D, Cholecalciferol, 1000 units TABS Take 1,000 Units by mouth at bedtime.   Yes [provider]    Allergies as of 01/04/2017 - Review Complete 01/04/2017  Allergen Reaction Noted  . Statins  05/16/2012  . Xarelto [rivaroxaban]  03/18/2016    Family History  Problem Relation Age of Onset  . Hypertension Mother   . Hyperlipidemia Mother   . Renal cancer Mother   . Atrial fibrillation Sister   . Stroke Father   . Colon cancer Neg Hx     Social  History   Social History  . Marital status: Married    Spouse name: N/A  . Number of children: 2  . Years of education: N/A   Occupational History  . Retired     Chief Technology Officer job   Social History Main Topics  . Smoking status: Former Smoker    Packs/day: 1.00    Years: 40.00    Quit date: 01/20/2008  . Smokeless tobacco: Never Used  . Alcohol use 0.6 oz/week    1 Standard drinks or equivalent per week     Comment: rare wine  . Drug use: No  . Sexual activity: Yes   Other Topics Concern  . Not on file   Social History Narrative   Retired from Architect   Lives in [place]: See HPI, otherwise negative ROS   Physical Exam: BP (!) 126/91   Pulse 63   Temp (!) 97.5 F (36.4 C) (Oral)   Resp 10   SpO2 95%  General:   Alert,  pleasant and cooperative in NAD Head:  Normocephalic and atraumatic. Neck:  Supple; Lungs:  Clear throughout to auscultation.    Heart:  Regular rate and Irregular RHYTHM Abdomen:  Soft, nontender and nondistended. Normal bowel sounds, without guarding, and without rebound.   Neurologic:  Alert and  oriented x4;  NO  NEW FOCAL DEFICITS  Impression/Plan:     DYSPHAGIA/ POS COLOGUARD TEST.   PLAN: 1. TCS/EGD/dil TODAY.  DISCUSSED PROCEDURE, BENEFITS, & RISKS: < 1% chance of medication reaction, bleeding, perforation, or rupture of spleen/liver.

## 2017-02-21 NOTE — Op Note (Signed)
[facility] Patient Name: [name] Procedure Date: 02/21/2017 12:33 PM MRN: [id number] Date of Birth: 11/20/1940 Attending MD: Barney Drain MD, MD CSN: [id number] Age: 76 Admit Type: Outpatient Procedure:                Colonoscopy with snare cautery/cold snare-forceps                            polypectomy and SQ INJECTION Indications:              Positive Cologuard test Providers:                Barney Drain MD, MD, Otis Peak B. Sharon Seller, RN,                            Randa Spike, Technician Referring MD:             Edwinna Areola. [place] Crane MD Medicines:                Meperidine 100 mg IV, Midazolam 5 mg IV Complications:            No immediate complications. Estimated Blood Loss:     Estimated blood loss was minimal. Procedure:                Pre-Anesthesia Assessment:                           - Prior to the procedure, a History and Physical                            was performed, and patient medications and                            allergies were reviewed. The patient's tolerance of                            previous anesthesia was also reviewed. The risks                            and benefits of the procedure and the sedation                            options and risks were discussed with the patient.                            All questions were answered, and informed consent                            was obtained. Prior Anticoagulants: The patient has                            taken aspirin, last dose was day of procedure. ASA                            Grade Assessment: II - A patient with mild systemic  disease. After reviewing the risks and benefits,                            the patient was deemed in satisfactory condition to                            undergo the procedure. After obtaining informed                            consent, the colonoscope was passed under direct                            vision. Throughout the procedure,  the patient's                            blood pressure, pulse, and oxygen saturations were                            monitored continuously. The [id number])                            scope was introduced through the anus and advanced                            to the the cecum, identified by appendiceal orifice                            and ileocecal valve. The colonoscopy was                            technically difficult and complex due to                            significant looping and the patient's agitation.                            Successful completion of the procedure was aided by                            increasing the dose of sedation medication,                            changing the patient to a supine position, applying                            abdominal pressure and COLOWRAP. The patient                            tolerated the procedure fairly well. The quality of                            the bowel preparation was good. The ileocecal  valve, appendiceal orifice, and rectum were                            photographed. Scope In: 1:25:10 PM Scope Out: 2:43:53 PM Scope Withdrawal Time: 1 hour 13 minutes 19 seconds  Total Procedure Duration: 1 hour 18 minutes 43 seconds  Findings:      A 40 mm polyp was found in the sigmoid colon. The polyp was       pedunculated. Area was successfully injected with 2 mL of a 1:10,000       solution of epinephrine for drug delivery. Area was tattooed with an       injection of 1 mL of Spot (carbon black). The polyp was removed       PIECEMEAL with a hot snare. THE LAST SEGMENT WAS REMOVED WITH PORTIONOF       THE STALK WHICH APPEARED NORMAL. Resection and retrieval were complete.       To prevent bleeding after the polypectomy, two hemostatic clips were       successfully placed (MR conditional). There was no bleeding at the end       of the procedure.      A 4 mm polyp was found in the  sigmoid colon. The polyp was sessile. The       polyp was removed with a cold biopsy forceps. Resection and retrieval       were complete.      Three sessile polyps were found in the rectum. The polyps were 3 to 5 mm       in size. These polyps were removed with a cold snare. Resection and       retrieval were complete.      A 5 mm polyp was found in the transverse colon. The polyp was sessile.       The polyp was removed with a hot snare. Resection and retrieval were       complete.      Multiple small and large-mouthed diverticula were found in the       recto-sigmoid colon, sigmoid colon, descending colon, splenic flexure       and ascending colon.      The recto-sigmoid colon and sigmoid colon revealed significantly       excessive looping.      Internal hemorrhoids were found during retroflexion. The hemorrhoids       were small. Impression:               - One 4 to 5 mm polyp in the sigmoid colon, removed                            with a hot snare. Resected and retrieved. Injected.                            Tattooed.                           - One 4 mm polyp in the sigmoid colon, removed with                            a cold biopsy forceps. Resected and retrieved.                           -  Three 3 to 5 mm polyps in the rectum, removed                            with a cold snare. Resected and retrieved.                           - One 5 mm polyp in the transverse colon, removed                            with a hot snare. Resected and retrieved.                           - Diverticulosis in the recto-sigmoid colon, in the                            sigmoid colon, in the descending colon, at the                            splenic flexure and in the ascending colon.                           - There was significant looping of the LEFT colon.                           - Internal hemorrhoids. Moderate Sedation:      Moderate (conscious) sedation was administered by the  endoscopy nurse       and supervised by the endoscopist. The following parameters were       monitored: oxygen saturation, heart rate, blood pressure, and response       to care. Total physician intraservice time was 92 minutes. Recommendation:           - Repeat colonoscopy 1-3 YEARS for surveillance.                           - High fiber diet. KUB PRIOR TO MRI.                           - Continue present medications.                           - Await pathology results.                           - Patient has a contact number available for                            emergencies. The signs and symptoms of potential                            delayed complications were discussed with the                            patient. Return to normal activities tomorrow.  Written discharge instructions were provided to the                            patient. Procedure Code(s):        --- Professional ---                           [id number], Colonoscopy, flexible; with removal of                            tumor(s), polyp(s), or other lesion(s) by snare                            technique                           45381, Colonoscopy, flexible; with directed                            submucosal injection(s), any substance                           03546, 59, Colonoscopy, flexible; with biopsy,                            single or multiple                           99152, Moderate sedation services provided by the                            same physician or other qualified health care                            professional performing the diagnostic or                            therapeutic service that the sedation supports,                            requiring the presence of an independent trained                            observer to assist in the monitoring of the                            patient's level of consciousness and physiological                             status; initial 15 minutes of intraservice time,                            patient age 9 years or older                           [phone], Moderate sedation services; each additional  15 minutes intraservice time                           99153, Moderate sedation services; each additional                            15 minutes intraservice time                           99153, Moderate sedation services; each additional                            15 minutes intraservice time                           99153, Moderate sedation services; each additional                            15 minutes intraservice time                           99153, Moderate sedation services; each additional                            15 minutes intraservice time Diagnosis Code(s):        --- Professional ---                           D12.5, Benign neoplasm of sigmoid colon                           D12.3, Benign neoplasm of transverse colon (hepatic                            flexure or splenic flexure)                           K62.1, Rectal polyp                           K64.8, Other hemorrhoids                           R19.5, Other fecal abnormalities                           K57.30, Diverticulosis of large intestine without                            perforation or abscess without bleeding CPT copyright 2016 American Medical Association. All rights reserved. The codes documented in this report are preliminary and upon coder review may  be revised to meet current compliance requirements. Barney Drain, MD Barney Drain MD, MD 02/21/2017 3:36:41 PM This report has been signed electronically. Number of Addenda: 0

## 2017-02-21 NOTE — Op Note (Signed)
[facility] Patient Name: [name] Procedure Date: 02/21/2017 2:49 PM MRN: [id number] Date of Birth: 11/15/1940 Attending MD: Barney Drain MD, MD CSN: [id number] Age: 76 Admit Type: Outpatient Procedure:                Upper GI endoscopy withe sophageal dilation/COLD                            FORCEPS BIOPSY Indications:              Dysphagia Providers:                Barney Drain MD, MD, Gwenlyn Fudge RN, RN, Randa Spike, Technician Referring MD:             Edwinna Areola. [place] Crane MD Medicines:                TCS + Meperidine 25 mg IV, Midazolam 2 mg IV Complications:            No immediate complications. Estimated Blood Loss:     Estimated blood loss was minimal. Procedure:                Pre-Anesthesia Assessment:                           - Prior to the procedure, a History and Physical                            was performed, and patient medications and                            allergies were reviewed. The patient's tolerance of                            previous anesthesia was also reviewed. The risks                            and benefits of the procedure and the sedation                            options and risks were discussed with the patient.                            All questions were answered, and informed consent                            was obtained. Prior Anticoagulants: The patient has                            taken aspirin, last dose was day of procedure. ASA                            Grade Assessment: II - A patient with mild systemic  disease. After reviewing the risks and benefits,                            the patient was deemed in satisfactory condition to                            undergo the procedure. After obtaining informed                            consent, the endoscope was passed under direct                            vision. Throughout the procedure, the patient's              blood pressure, pulse, and oxygen saturations were                            monitored continuously. The [id number] ([id number])                            scope was introduced through the mouth, and                            advanced to the second part of duodenum. The upper                            GI endoscopy was technically difficult and complex                            due to the patient's agitation. Successful                            completion of the procedure was aided by increasing                            the dose of sedation medication. The patient                            tolerated the procedure fairly well. Scope In: 2:51:53 PM Scope Out: 3:00:57 PM Total Procedure Duration: 0 hours 9 minutes 4 seconds  Findings:      One moderate (circumferential scarring or stenosis; an endoscope may       pass) benign-appearing, intrinsic stenosis was found. And was traversed.       A guidewire was placed and the scope was withdrawn. Dilation was       performed with a Savary dilator with mild resistance at 15 mm, 16 mm and       17 mm. Estimated blood loss was minimal.      Patchy mild inflammation characterized by congestion (edema) and       erythema was found in the gastric antrum. Biopsies were taken with a       cold forceps for Helicobacter pylori testing.      Mild mucosal changes characterized by scarring were found at the pylorus.      The examined duodenum was normal. Impression:               -  Benign-appearing esophageal stenosis. Dilated.                           - Gastritis. Biopsied.                           - Scarred mucosa in the pylorus DUE TO PRIOR PUD. Moderate Sedation:      Moderate (conscious) sedation was administered by the endoscopy nurse       and supervised by the endoscopist. The following parameters were       monitored: oxygen saturation, heart rate, blood pressure, and response       to care. Total physician intraservice time was 92  minutes. Recommendation:           - Await pathology results. KUB TO CLEAR COLON PRIOR                            TO MRI                           - Return to my office in 3 months.                           - High fiber diet.                           - Continue present medications.                           - Patient has a contact number available for                            emergencies. The signs and symptoms of potential                            delayed complications were discussed with the                            patient. Return to normal activities tomorrow.                            Written discharge instructions were provided to the                            patient. Procedure Code(s):        --- Professional ---                           [phone], Esophagogastroduodenoscopy, flexible,                            transoral; with insertion of guide wire followed by                            passage of dilator(s) through esophagus over guide  wire                           [id number], Esophagogastroduodenoscopy, flexible,                            transoral; with biopsy, single or multiple                           99152, Moderate sedation services provided by the                            same physician or other qualified health care                            professional performing the diagnostic or                            therapeutic service that the sedation supports,                            requiring the presence of an independent trained                            observer to assist in the monitoring of the                            patient's level of consciousness and physiological                            status; initial 15 minutes of intraservice time,                            patient age 29 years or older                           [phone], Moderate sedation services; each additional                            15 minutes intraservice  time                           99153, Moderate sedation services; each additional                            15 minutes intraservice time                           99153, Moderate sedation services; each additional                            15 minutes intraservice time                           99153, Moderate sedation services; each additional  15 minutes intraservice time                           99153, Moderate sedation services; each additional                            15 minutes intraservice time Diagnosis Code(s):        --- Professional ---                           K22.2, Esophageal obstruction                           K29.70, Gastritis, unspecified, without bleeding                           K31.89, Other diseases of stomach and duodenum                           R13.10, Dysphagia, unspecified CPT copyright 2016 American Medical Association. All rights reserved. The codes documented in this report are preliminary and upon coder review may  be revised to meet current compliance requirements. Barney Drain, MD Barney Drain MD, MD 02/21/2017 3:41:49 PM This report has been signed electronically. Number of Addenda: 0

## 2017-02-23 ENCOUNTER — Ambulatory Visit (INDEPENDENT_AMBULATORY_CARE_PROVIDER_SITE_OTHER): Payer: PPO | Admitting: Cardiovascular Disease

## 2017-02-23 VITALS — BP 112/64 | HR 62 | Ht 69.0 in | Wt 205.0 lb

## 2017-02-23 DIAGNOSIS — I714 Abdominal aortic aneurysm, without rupture, unspecified: Secondary | ICD-10-CM

## 2017-02-23 DIAGNOSIS — I10 Essential (primary) hypertension: Secondary | ICD-10-CM

## 2017-02-23 DIAGNOSIS — E785 Hyperlipidemia, unspecified: Secondary | ICD-10-CM

## 2017-02-23 DIAGNOSIS — I481 Persistent atrial fibrillation: Secondary | ICD-10-CM | POA: Diagnosis not present

## 2017-02-23 DIAGNOSIS — I4819 Other persistent atrial fibrillation: Secondary | ICD-10-CM

## 2017-02-23 NOTE — Patient Instructions (Signed)

## 2017-02-23 NOTE — Progress Notes (Signed)
SUBJECTIVE: The patient presents for follow up of persistent atrial fibrillation. He has a Multimedia programmer. He also has an infrarenal abdominal aortic aneurysm measuring 4.43 x 4.38 cm on 12/28/16.  Transesophageal echocardiogram on 04/30/16 demonstrated normal left ventricular systolic function, LVEF 37-85%, with mild aortic and mitral regurgitation.  He is doing very well. He denies chest pain. He has seasonal allergies and gets some nasal and chest congestion from that. He denies orthopnea and leg swelling.  I personally reviewed labs performed on 01/18/17: White blood cells 4.7, hemoglobin 13.9, platelets 180, sodium 145, potassium 3.9, BUN 22, creatinine 1.11, total cholesterol 150, HDL 84, LDL 86, hemoglobin A1c 5.7%.    Soc Hx: He and his wife are originally from [place]. Moved to [place] in early-mid 80's.  Review of Systems: As per "subjective", otherwise negative.  Allergies  Allergen Reactions  . Statins Other (See Comments)    Muscle aches  . Xarelto [Rivaroxaban] Other (See Comments)    Developed redness in both legs    Current Outpatient Prescriptions  Medication Sig Dispense Refill  . acetaminophen (TYLENOL) 500 MG tablet Take 1,000 mg by mouth every 8 (eight) hours as needed (pain).    . Ascorbic Acid (VITAMIN C) 1000 MG tablet Take 1,000 mg by mouth daily.    Marland Kitchen aspirin EC 325 MG tablet Take 325 mg by mouth daily.    . cetirizine (ZYRTEC) 10 MG tablet Take 10 mg by mouth at bedtime.     Marland Kitchen diltiazem (CARDIZEM CD) 240 MG 24 hr capsule Take 240 mg by mouth at bedtime.     . fluticasone (FLONASE) 50 MCG/ACT nasal spray Place 1 spray into both nostrils 2 (two) times daily.     . meclizine (ANTIVERT) 25 MG tablet Take 25 mg by mouth 2 (two) times daily. For dizziness    . metoprolol succinate (TOPROL-XL) 25 MG 24 hr tablet Take 0.5 tablets (12.5 mg total) by mouth daily.    . Multiple Vitamin (MULTIVITAMIN) tablet Take 1 tablet by mouth daily.    . pantoprazole  (PROTONIX) 40 MG tablet Take 1 tablet (40 mg total) by mouth daily. Take 30 minutes before breakfast 30 tablet 3  . pravastatin (PRAVACHOL) 20 MG tablet TAKE ONE TABLET BY MOUTH ONCE EVERY EVENING. 90 tablet 3  . traZODone (DESYREL) 50 MG tablet Take 50 mg by mouth at bedtime.    . triamterene-hydrochlorothiazide (MAXZIDE-25) 37.5-25 MG per tablet Take 1 tablet by mouth daily.    . Vitamin D, Cholecalciferol, 1000 units TABS Take 1,000 Units by mouth at bedtime.     No current facility-administered medications for this visit.     Past Medical History:  Diagnosis Date  . AAA (abdominal aortic aneurysm) ([place])   . Aneurysm ([place])   . Arthritis   . Degenerative joint disease   . History of blood transfusion 12/2007   "bleeding for Warfarin"  . Hyperlipidemia   . Hypertension    Admitted for chest pain and 06/2011-neg stress Myoview,mod. LVH with nl EF on echo; TSH of 3.75  . Meniere's disease   . Nephrolithiasis   . Peripheral vascular disease ([place])    Small AAA (3.7-4cm 09/2010), small SMA aneurysm (23mm)  . Permanent atrial fibrillation ([place])    a. s/p Watchman  . Renal hemorrhage, right 2009   2009 while anticoagulated with Coumadin; treated with the renal artery embolization    Past Surgical History:  Procedure Laterality Date  . EMBOLIZATION  12/2007  Left renal hematoma, status post angiogram and embolization/notes 09/29/2010  . FRACTURE SURGERY    . LEFT ATRIAL APPENDAGE OCCLUSION N/A 03/18/2016   Procedure: LEFT ATRIAL APPENDAGE OCCLUSION;  Surgeon: [name], MD;  Location: [facility];  Service: Cardiovascular;  Laterality: N/A;  . ORIF FEMUR FRACTURE Right 1960   "2 steel rods in leg"  . PILONIDAL CYST EXCISION    . TEE WITHOUT CARDIOVERSION N/A 03/10/2016   Procedure: TRANSESOPHAGEAL ECHOCARDIOGRAM (TEE);  Surgeon: Josue Hector, MD;  Location: [facility];  Service: Cardiovascular;  Laterality: N/A;  . TEE WITHOUT CARDIOVERSION N/A 04/30/2016   Procedure:  TRANSESOPHAGEAL ECHOCARDIOGRAM (TEE);  Surgeon: Josue Hector, MD;  Location: [facility];  Service: Cardiovascular;  Laterality: N/A;  . Watchman procedure  02/2016    Social History   Social History  . Marital status: Married    Spouse name: N/A  . Number of children: 2  . Years of education: N/A   Occupational History  . Retired     Chief Technology Officer job   Social History Main Topics  . Smoking status: Former Smoker    Packs/day: 1.00    Years: 40.00    Quit date: 01/20/2008  . Smokeless tobacco: Never Used  . Alcohol use 0.6 oz/week    1 Standard drinks or equivalent per week     Comment: rare wine  . Drug use: No  . Sexual activity: Yes   Other Topics Concern  . Not on file   Social History Narrative   Retired from Architect   Lives in [place]:   02/23/17 0858  BP: 112/64  Pulse: 62  SpO2: 96%  Weight: 205 lb (93 kg)  Height: 5\' 9"  (1.753 m)    Wt Readings from Last 3 Encounters:  02/23/17 205 lb (93 kg)  01/04/17 205 lb (93 kg)  12/28/16 202 lb (91.6 kg)     PHYSICAL EXAM General: NAD HEENT: Normal. Neck: No JVD, no thyromegaly. Lungs: Clear to auscultation bilaterally with normal respiratory effort. CV: Nondisplaced PMI.  Regular rate and irregular rhythm, normal S1/S2, no S3, no murmur. No pretibial or periankle edema.  No carotid bruit.   Abdomen: Soft, nontender, no distention.  Neurologic: Alert and oriented.  Psych: Normal affect. Skin: Normal. Musculoskeletal: No gross deformities.    ECG: Most recent ECG reviewed.   Labs: Lab Results  Component Value Date/Time   K 3.4 (L) 04/07/2016 06:08 AM   BUN 13 04/07/2016 06:08 AM   CREATININE 0.84 04/07/2016 06:08 AM   CREATININE 1.30 12/08/2012 07:05 AM   ALT 17 04/04/2016 08:18 PM   TSH 3.758 06/19/2012 07:15 AM   HGB 11.1 (L) 04/07/2016 06:08 AM     Lipids: Lab Results  Component Value Date/Time   LDLCALC 91 12/08/2012 07:05 AM   CHOL 161  12/08/2012 07:05 AM   TRIG 165 (H) 12/08/2012 07:05 AM   HDL 37 (L) 12/08/2012 07:05 AM       ASSESSMENT AND PLAN: 1. Persistent atrial fibrillation: symptomatically stable. Heart rate is controlled on long-acting diltiazem and long-acting metoprolol. He is maintained on aspirin.  2. Hypertension: Controlled. No changes.  3. Hyperlipidemia: Lipids reviewed above. Continue statin therapy.  4. Abdominal aortic aneurysm: Most recent Doppler ultrasound results reviewed above. Followed by vascular surgery.      Disposition: Follow up 1 yr   Kate Sable, M.D., F.A.C.C.

## 2017-03-02 NOTE — Progress Notes (Signed)
Pt is aware.  

## 2017-03-02 NOTE — Progress Notes (Signed)
ON RECALL  °

## 2017-03-03 ENCOUNTER — Encounter (HOSPITAL_COMMUNITY): Payer: Self-pay | Admitting: Gastroenterology

## 2017-04-27 ENCOUNTER — Telehealth: Payer: Self-pay | Admitting: Gastroenterology

## 2017-04-27 NOTE — Telephone Encounter (Signed)
Noted  

## 2017-04-27 NOTE — Telephone Encounter (Signed)
PT ONLY NEED KUB IF HE SOMEONES ORDERS AN MRI.

## 2017-04-27 NOTE — Telephone Encounter (Signed)
Per SF recommendations in procedure note on 02/21/2017 patient is to have KUB prior to MRI, but no MRI for 30 days due to metal clips in place.

## 2017-05-26 ENCOUNTER — Ambulatory Visit: Payer: PPO | Admitting: Gastroenterology

## 2017-06-07 ENCOUNTER — Encounter: Payer: Self-pay | Admitting: Gastroenterology

## 2017-06-07 ENCOUNTER — Ambulatory Visit: Payer: PPO | Admitting: Gastroenterology

## 2017-06-07 VITALS — BP 153/89 | HR 80 | Temp 97.0°F | Ht 69.0 in | Wt 209.2 lb

## 2017-06-07 DIAGNOSIS — K2970 Gastritis, unspecified, without bleeding: Secondary | ICD-10-CM

## 2017-06-07 DIAGNOSIS — Z86010 Personal history of colonic polyps: Secondary | ICD-10-CM | POA: Diagnosis not present

## 2017-06-07 NOTE — Patient Instructions (Signed)
I am glad you are doing well! Your next colonoscopy will be in 3 years.   We will see you back in 1 year.  I will review and let you know about the Protonix.

## 2017-06-07 NOTE — Progress Notes (Signed)
Referring Provider: Celene Squibb, MD Primary Care Physician:  Celene Squibb, MD  Chief Complaint  Patient presents with  . Follow-up    3 month pp. Doing okay  . Dysphagia    f/u    HPI:   TOA MIA is a 77 y.o. male presenting today with a history of positive cologuard test, dysphagia. EGD with benign-appearing esophageal stenosis s/p dilation, gastritis s/p biopsy, scarred mucosa in pylorus due to prior PUD. Reactive gastropathy. Colonoscopy: multiple polyps, one s/p piecemeal removal with epi and clips placed, . Multiple small and large mouthed diverticula. Recto-sigmoid and sigmoid with significant excess looping. Internal hemorrhoids. One advanced adenoma, 3 simple adenomas, 2 hyperplastic polyps. Colonoscopy in 3 years.   Occasional LLQ discomfort, intermittent. Will have softer stool. BM doesn't seem to make a difference with discomfort. No urinary symptoms. States he has had this LLQ discomfort since 2009 when he had spontaneous bleeding from left renal artery s/p embolization.  had Rare occasions "food going down the wrong way". Not often. Not on a PPI.   Past Medical History:  Diagnosis Date  . AAA (abdominal aortic aneurysm) ([place])   . Aneurysm ([place])   . Arthritis   . Degenerative joint disease   . History of blood transfusion 12/2007   "bleeding for Warfarin"  . Hyperlipidemia   . Hypertension    Admitted for chest pain and 06/2011-neg stress Myoview,mod. LVH with nl EF on echo; TSH of 3.75  . Meniere's disease   . Nephrolithiasis   . 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 Permanent atrial fibrillation ([place])    a. s/p Watchman  . Renal hemorrhage, right 2009   2009 while anticoagulated with Coumadin; treated with the renal artery embolization    Past Surgical History:  Procedure Laterality Date  . BIOPSY  02/21/2017   Procedure: BIOPSY;  Surgeon: Danie Binder, MD;  [facility];   Service: Endoscopy;;  gastric  . COLONOSCOPY N/A 02/21/2017   multiple polyps, one requiring piecemeal removal with epi and clip placement, multiple small and large mouthed diverticula, recto-sigmoid and sigmoid with significant excess looping, internal hemorrhoids. Path with one advanced adenoma, 3 simple adenomas, 2 hyperplastic polyps. Survelilance 3 years   . EMBOLIZATION  12/2007   Left renal hematoma, status post angiogram and embolization/notes 09/29/2010  . ESOPHAGOGASTRODUODENOSCOPY N/A 02/21/2017   benign-appearing esophageal stenosis s/p dilation, gastritis, scarred mucosa in pylorus due to prior PUD. reactive gastropathy on path  . FRACTURE SURGERY    . LEFT ATRIAL APPENDAGE OCCLUSION N/A 03/18/2016   Procedure: LEFT ATRIAL APPENDAGE OCCLUSION;  Surgeon: [name], MD;  Location: [facility];  Service: Cardiovascular;  Laterality: N/A;  . ORIF FEMUR FRACTURE Right 1960   "2 steel rods in leg"  . PILONIDAL CYST EXCISION    . POLYPECTOMY  02/21/2017   Procedure: POLYPECTOMY;  Surgeon: Danie Binder, MD;  [facility];  Service: Endoscopy;;  colon  . SAVORY DILATION N/A 02/21/2017   Procedure: SAVORY DILATION;  Surgeon: Danie Binder, MD;  [facility];  Service: Endoscopy;  Laterality: N/A;  . TEE WITHOUT CARDIOVERSION N/A 03/10/2016   Procedure: TRANSESOPHAGEAL ECHOCARDIOGRAM (TEE);  Surgeon: Josue Hector, MD;  Location: [facility];  Service: Cardiovascular;  Laterality: N/A;  . TEE WITHOUT CARDIOVERSION N/A 04/30/2016   Procedure: TRANSESOPHAGEAL ECHOCARDIOGRAM (TEE);  Surgeon: Josue Hector, MD;  [facility];  Service: Cardiovascular;  Laterality: N/A;  . Watchman procedure  02/2016    Current Outpatient Medications  Medication Sig Dispense Refill  . acetaminophen (TYLENOL) 500 MG tablet Take 1,000 mg by mouth every 8 (eight) hours as needed (pain).    . Ascorbic Acid (VITAMIN C) 1000 MG tablet Take 1,000 mg by mouth daily.    Marland Kitchen  aspirin EC 325 MG tablet Take 325 mg by mouth daily.    . cetirizine (ZYRTEC) 10 MG tablet Take 10 mg by mouth at bedtime.     Marland Kitchen diltiazem (CARDIZEM CD) 240 MG 24 hr capsule Take 240 mg by mouth at bedtime.     . fluticasone (FLONASE) 50 MCG/ACT nasal spray Place 1 spray into both nostrils 2 (two) times daily.     . meclizine (ANTIVERT) 25 MG tablet Take 25 mg by mouth 2 (two) times daily. For dizziness    . metoprolol succinate (TOPROL-XL) 25 MG 24 hr tablet Take 0.5 tablets (12.5 mg total) by mouth daily.    . Multiple Vitamin (MULTIVITAMIN) tablet Take 1 tablet by mouth daily.    . pravastatin (PRAVACHOL) 20 MG tablet TAKE ONE TABLET BY MOUTH ONCE EVERY EVENING. 90 tablet 3  . traZODone (DESYREL) 50 MG tablet Take 50 mg by mouth at bedtime.    . triamterene-hydrochlorothiazide (MAXZIDE-25) 37.5-25 MG per tablet Take 1 tablet by mouth daily.    . Vitamin D, Cholecalciferol, 1000 units TABS Take 1,000 Units by mouth at bedtime.     No current facility-administered medications for this visit.     Allergies as of 06/07/2017 - Review Complete 06/07/2017  Allergen Reaction Noted  . Statins Other (See Comments) 05/16/2012  . Xarelto [rivaroxaban] Other (See Comments) 03/18/2016    Family History  Problem Relation Age of Onset  . Hypertension Mother   . Hyperlipidemia Mother   . Renal cancer Mother   . Atrial fibrillation Sister   . Stroke Father   . Colon cancer Cousin   . Colon polyps Neg Hx     Social History   Socioeconomic History  . Marital status: Married    Spouse name: None  . Number of children: 2  . Years of education: None  . Highest education level: None  Social Needs  . Financial resource strain: None  . Food insecurity - worry: None  . Food insecurity - inability: None  . Transportation needs - medical: None  . Transportation needs - non-medical: None  Occupational History  . Occupation: Retired    Comment: Chief Technology Officer job  Tobacco Use  .  Smoking status: Former Smoker    Packs/day: 1.00    Years: 40.00    Pack years: 40.00    Last attempt to quit: 01/20/2008    Years since quitting: 9.3  . Smokeless tobacco: Never Used  Substance and Sexual Activity  . Alcohol use: Yes    Alcohol/week: 0.6 oz    Types: 1 Standard drinks or equivalent per week    Comment: rare wine  . Drug use: No  . Sexual activity: Yes  Other Topics Concern  . None  Social History Narrative   Retired from Architect   Lives in [place]: Gen: Denies fever, chills, anorexia. Denies fatigue, weakness, weight loss.  CV: Denies chest pain, palpitations, syncope, peripheral edema, and claudication. Resp: Denies dyspnea at rest, cough, wheezing, coughing up blood, and pleurisy. GI: see HPI  Derm: Denies rash, itching, dry skin Psych:  Denies depression, anxiety, memory loss, confusion. No homicidal or suicidal ideation.  Heme: Denies bruising, bleeding, and enlarged lymph nodes.  Physical Exam: BP (!) 153/89   Pulse 80   Temp (!) 97 F (36.1 C) (Oral)   Ht 5\' 9"  (1.753 m)   Wt 209 lb 3.2 oz (94.9 kg)   BMI 30.89 kg/m  General:   Alert and oriented. No distress noted. Pleasant and cooperative.  Head:  Normocephalic and atraumatic. Eyes:  Conjuctiva clear without scleral icterus. Mouth:  Oral mucosa pink and moist. Good dentition. No lesions. Abdomen:  +BS, soft, mild point tenderness LLQ and non-distended. No rebound or guarding. No HSM or masses noted. Msk:  Symmetrical without gross deformities. Normal posture. Extremities:  Without edema. Neurologic:  Alert and  oriented x4 Psych:  Alert and cooperative. Normal mood and affect.

## 2017-06-11 ENCOUNTER — Encounter: Payer: Self-pay | Admitting: Gastroenterology

## 2017-06-11 NOTE — Assessment & Plan Note (Signed)
On EGD. Benign-appearing esophageal stenosis s/p dilation, with resolution of dysphagia. Not taking Protonix, as he was not sure he should continue. Evidence of healed prior PUD on EGD. Continue low dose once daily Protonix due to need for aspirin 325 mg daily, history of PUD. Return in 1 year.

## 2017-06-11 NOTE — Assessment & Plan Note (Signed)
Multiple polyps, one advanced and 3 simple adenomas. Surveillance in 3 years. Notes chronic LLQ mild discomfort that is vague, present since about 2009. Discussed imaging if worsening. However, he notes this has been present since intervention for left renal artery bleeding in 2009. Monitor for now.

## 2017-06-13 NOTE — Progress Notes (Signed)
CC'D TO PCP °

## 2017-07-08 ENCOUNTER — Other Ambulatory Visit: Payer: Self-pay | Admitting: Gastroenterology

## 2017-07-15 DIAGNOSIS — R0789 Other chest pain: Secondary | ICD-10-CM | POA: Diagnosis not present

## 2017-07-15 DIAGNOSIS — R05 Cough: Secondary | ICD-10-CM | POA: Diagnosis not present

## 2017-07-15 DIAGNOSIS — J069 Acute upper respiratory infection, unspecified: Secondary | ICD-10-CM | POA: Diagnosis not present

## 2017-07-18 ENCOUNTER — Other Ambulatory Visit (HOSPITAL_COMMUNITY): Payer: PPO

## 2017-07-18 ENCOUNTER — Ambulatory Visit: Payer: PPO | Admitting: Family

## 2017-07-22 ENCOUNTER — Ambulatory Visit: Payer: PPO | Admitting: Family

## 2017-07-22 ENCOUNTER — Other Ambulatory Visit (HOSPITAL_COMMUNITY): Payer: PPO

## 2017-07-29 ENCOUNTER — Ambulatory Visit (INDEPENDENT_AMBULATORY_CARE_PROVIDER_SITE_OTHER): Payer: PPO | Admitting: Family

## 2017-07-29 ENCOUNTER — Other Ambulatory Visit: Payer: Self-pay

## 2017-07-29 ENCOUNTER — Ambulatory Visit (HOSPITAL_COMMUNITY)
Admission: RE | Admit: 2017-07-29 | Discharge: 2017-07-29 | Disposition: A | Discharge status: Home / Self Care | Payer: PPO | Source: Ambulatory Visit | Attending: Family | Admitting: Family

## 2017-07-29 ENCOUNTER — Encounter: Payer: Self-pay | Admitting: Family

## 2017-07-29 VITALS — BP 125/89 | HR 59 | Temp 96.9°F | Resp 18 | Ht 69.0 in | Wt 204.0 lb

## 2017-07-29 DIAGNOSIS — I723 Aneurysm of iliac artery: Secondary | ICD-10-CM

## 2017-07-29 DIAGNOSIS — I714 Abdominal aortic aneurysm, without rupture, unspecified: Secondary | ICD-10-CM

## 2017-07-29 NOTE — Progress Notes (Signed)
[facility]    CC: Follow up Abdominal Aortic Aneurysm  History of Present Illness  [name] is a 77 y.o. (01/27/1941) male returns for continued follow-up regarding an abdominal aortic aneurysm. He had nominal discomfort which lasted about 1 hour. He was seen in the emergency department and eventually had a CT scan which Dr. Laruth Bouchard at pt's visit on 12/17/14. This revealed the aneurysm to be approximately 4 cm in maximum diameter with no evidence of leakage.  He does have some mild claudication type symptoms in both legs after walking several blocks which is not limiting him. Pt's wife states that pt had bleeding from a renal artery in 2009, had a great deal of internal bleeding, was in ICU for a week, states this artery was cauterized. He was on warfarin at that time for a-fib; has not been on warfarin since then. He takes ASA and rate control medications.  He has constant RLQ abdominal pain that has exacerbations and waning pain since about 2016; this has been evaluated with no etiology found.  He has occasional low back pain when he lifts or strains, but no pain referable to AAA.   Pt denies any know hx of stroke or TIA.  He had another CTA abd/pelvis on 06/03/15 requested by Dr. Nevada Crane, pt's PCP, to evaluate abdominal pain, see results below. Dr. Kellie Simmering last saw pt on 12/17/14. At that time CT showed a4 cm abdominal aortic aneurysm which does extend above the level of renal arteries proximally -not a stent graft candidate  Aneurysm had not changed significantly in size over the previous few years -approximately 4 cm. He reports occasional right groin pain which he states may be a hernia.  Pt reports chronic low back pain, no new back pain.  The patient reports tired feeling in both calves if he walks about 1/4 mile uphill; states Dr. Lattie Haw requested and pt had ABI's performed years ago, this was normal; see normal ABI results under  DATA.  He is taking 325 mg ASA for his atrial fib,  this is managed by Dr. Rayann Heman. Pt had Watchman procedure done in October 2017.   Pt Diabetic: No Pt smoker: former smoker, quit in 2009    Past Medical History:  Diagnosis Date  . AAA (abdominal aortic aneurysm) ([place])   . Aneurysm ([place])   . Arthritis   . Degenerative joint disease   . History of blood transfusion 12/2007   "bleeding for Warfarin"  . Hyperlipidemia   . Hypertension    Admitted for chest pain and 06/2011-neg stress Myoview,mod. LVH with nl EF on echo; TSH of 3.75  . Meniere's disease   . Nephrolithiasis   . Peripheral vascular disease ([place])    Small AAA (3.7-4cm 09/2010), small SMA aneurysm (30mm)  . Permanent atrial fibrillation ([place])    a. s/p Watchman  . Renal hemorrhage, right 2009   2009 while anticoagulated with Coumadin; treated with the renal artery embolization   Past Surgical History:  Procedure Laterality Date  . BIOPSY  02/21/2017   Procedure: BIOPSY;  Surgeon: Danie Binder, MD;  [facility];  Service: Endoscopy;;  gastric  . COLONOSCOPY N/A 02/21/2017   multiple polyps, one requiring piecemeal removal with epi and clip placement, multiple small and large mouthed diverticula, recto-sigmoid and sigmoid with significant excess looping, internal hemorrhoids. Path with one advanced adenoma, 3 simple adenomas, 2 hyperplastic polyps. Survelilance 3 years   . EMBOLIZATION  12/2007   Left renal  hematoma, status post angiogram and embolization/notes 09/29/2010  . ESOPHAGOGASTRODUODENOSCOPY N/A 02/21/2017   benign-appearing esophageal stenosis s/p dilation, gastritis, scarred mucosa in pylorus due to prior PUD. reactive gastropathy on path  . FRACTURE SURGERY    . LEFT ATRIAL APPENDAGE OCCLUSION N/A 03/18/2016   Procedure: LEFT ATRIAL APPENDAGE OCCLUSION;  Surgeon: [name], MD;  Location: [facility];  Service: Cardiovascular;  Laterality: N/A;  . ORIF FEMUR FRACTURE Right 1960   "2  steel rods in leg"  . PILONIDAL CYST EXCISION    . POLYPECTOMY  02/21/2017   Procedure: POLYPECTOMY;  Surgeon: Danie Binder, MD;  [facility];  Service: Endoscopy;;  colon  . SAVORY DILATION N/A 02/21/2017   Procedure: SAVORY DILATION;  Surgeon: Danie Binder, MD;  [facility];  Service: Endoscopy;  Laterality: N/A;  . TEE WITHOUT CARDIOVERSION N/A 03/10/2016   Procedure: TRANSESOPHAGEAL ECHOCARDIOGRAM (TEE);  Surgeon: Josue Hector, MD;  Location: [facility];  Service: Cardiovascular;  Laterality: N/A;  . TEE WITHOUT CARDIOVERSION N/A 04/30/2016   Procedure: TRANSESOPHAGEAL ECHOCARDIOGRAM (TEE);  Surgeon: Josue Hector, MD;  Location: [facility];  Service: Cardiovascular;  Laterality: N/A;  . Watchman procedure  02/2016   Social History Social History   Socioeconomic History  . Marital status: Married    Spouse name: Not on file  . Number of children: 2  . Years of education: Not on file  . Highest education level: Not on file  Social Needs  . Financial resource strain: Not on file  . Food insecurity - worry: Not on file  . Food insecurity - inability: Not on file  . Transportation needs - medical: Not on file  . Transportation needs - non-medical: Not on file  Occupational History  . Occupation: Retired    Comment: Chief Technology Officer job  Tobacco Use  . Smoking status: Former Smoker    Packs/day: 1.00    Years: 40.00    Pack years: 40.00    Last attempt to quit: 01/20/2008    Years since quitting: 9.5  . Smokeless tobacco: Never Used  Substance and Sexual Activity  . Alcohol use: Yes    Alcohol/week: 0.6 oz    Types: 1 Standard drinks or equivalent per week    Comment: rare wine  . Drug use: No  . Sexual activity: Yes  Other Topics Concern  . Not on file  Social History Narrative   Retired from Architect   Lives in [place]   Family History Family History  Problem Relation Age of Onset  . Hypertension Mother   .  Hyperlipidemia Mother   . Renal cancer Mother   . Atrial fibrillation Sister   . Stroke Father   . Colon cancer Cousin   . Colon polyps Neg Hx     Current Outpatient Medications on File Prior to Visit  Medication Sig Dispense Refill  . acetaminophen (TYLENOL) 500 MG tablet Take 1,000 mg by mouth every 8 (eight) hours as needed (pain).    . Ascorbic Acid (VITAMIN C) 1000 MG tablet Take 1,000 mg by mouth daily.    Marland Kitchen aspirin EC 325 MG tablet Take 325 mg by mouth daily.    . cetirizine (ZYRTEC) 10 MG tablet Take 10 mg by mouth at bedtime.     Marland Kitchen diltiazem (CARDIZEM CD) 240 MG 24 hr capsule Take 240 mg by mouth at bedtime.     . fluticasone (FLONASE) 50 MCG/ACT nasal spray Place 1 spray into both nostrils  2 (two) times daily.     . meclizine (ANTIVERT) 25 MG tablet Take 25 mg by mouth 2 (two) times daily. For dizziness    . metoprolol succinate (TOPROL-XL) 25 MG 24 hr tablet Take 0.5 tablets (12.5 mg total) by mouth daily.    . Multiple Vitamin (MULTIVITAMIN) tablet Take 1 tablet by mouth daily.    . pantoprazole (PROTONIX) 40 MG tablet TAKE 1 TABLET BY MOUTH DAILY 30 MINUTES PRIOR TO BREAKFAST. 30 tablet 3  . pravastatin (PRAVACHOL) 20 MG tablet TAKE ONE TABLET BY MOUTH ONCE EVERY EVENING. 90 tablet 3  . traZODone (DESYREL) 50 MG tablet Take 50 mg by mouth at bedtime.    . triamterene-hydrochlorothiazide (MAXZIDE-25) 37.5-25 MG per tablet Take 1 tablet by mouth daily.    . Vitamin D, Cholecalciferol, 1000 units TABS Take 1,000 Units by mouth at bedtime.     No current facility-administered medications on file prior to visit.    Allergies  Allergen Reactions  . Statins Other (See Comments)    Muscle aches Muscle aches  . Xarelto [Rivaroxaban] Other (See Comments)    Developed redness in both legs    ROS: See HPI for pertinent positives and negatives.  Physical Examination  Vitals:   07/29/17 0838  BP: 125/89  Pulse: (!) 59  Resp: 18  Temp: (!) 96.9 F (36.1 C)  TempSrc: Oral   SpO2: 96%  Weight: 204 lb (92.5 kg)  Height: 5\' 9"  (1.753 m)   Body mass index is 30.13 kg/m.  General: A&O x 3, WD, male. HEENT: No gross abnormalities  Pulmonary: Sym exp, respirations are non labored, good air movt, CTAB, no rales, rhonchi, or wheezing. Cardiac: Irregular rhythm, bradycardic,no detectedmurmur.   Carotid Bruits Right Left   Negative Negative   Abdominal aortic pulse is notpalpable Radial pulses are 2+ palpable and =  VASCULAR EXAM:  LE Pulses Right Left  FEMORAL palpable palpable   POPLITEAL 2+palpable  2+palpable  POSTERIOR TIBIAL 2+palpable  2+palpable   DORSALIS PEDIS 1+ palpable 1+ palpable    Gastrointestinal: soft, NTND, -G/R, - HSM, - masses palpated, - CVAT B. Musculoskeletal: M/S 5/5 throughout, Extremities without ischemic changes. Skin: No rashes, no ulcers, no cellulitis.   Neurologic: CN 2-12 intact except slightly hard of hearing, Pain and light touch intact in extremities are intact, Motor exam as listed above.  Psychiatric: Normal thought content, mood appropriate to clinical situation.    DATA  AAA Duplex (07/29/2017):  Previous size:  4.43 cm (Date: 12/28/16), Right CIA: 1.44 cm; Left CIA: 2.17 cm   Current size:  4.6 cm (Date: 07-29-17); Right CIA: 1.3 cm; Left CIA: 2.2 cm  10/11/11/ ABI's: At rest, the right ABI is 1.23, left 1.21. Triphasic waveforms are noted throughout both lower extremities on Doppler interrogation. Pulse volume recording is normal bilaterally. Exercise testing was not performed. IMPRESSION: 1. No evidence of hemodynamically significant lower extremity arterial occlusive disease at rest   CTA abd/pelvis on 06/03/15 requested by Dr. Nevada Crane, to evaluate abdominal pain: IMPRESSION: 1. Acute Cholecystitis. Right Upper Quadrant Ultrasound would be complementary in this setting for treatment planning. 2. No other acute or  inflammatory process in the abdomen or pelvis. Normal appendix. 3. Chronic infrarenal abdominal aortic aneurysm is stable since July, maximum diameter 43-44 mm. Major branches of the abdominal aorta appears stable. Ectatic and calcified bilateral iliac arteries are stable. Recommend followup by ultrasound in 1 year. This recommendation follows ACR consensus guidelines: White Paper of the ACR Incidental Findings Committee  II on Vascular Findings. J Am Coll Radiol 2013; 10:789-794. 4. Severe colonic diverticulosis with no active inflammation   Medical Decision Making  The patient is a 77 y.o. male who presents with asymptomatic AAA with a slight increase in size to 4.6 cm from 4.43 cm seven months prior; left CIA at 2.2 cm.  Prominent bilateral popliteal pulses: bilateral popliteal artery duplex (12/28/16) revealed no evidence of popliteal aneurysms   Based on this patient's exam and diagnostic studies, the patient will follow up in 6 months with the following studies: AAA duplex.  Consideration for repair of AAA would be made when the size is 5.0 cm, growth > 1 cm/yr, and symptomatic status.  Consideration for repair of common iliac artery aneurysm would be made when the size is 3.0 cm or greater.         The patient was given information about AAA including signs, symptoms, treatment, and how to minimize the risk of enlargement and rupture of aneurysms.    I emphasized the importance of maximal medical management including strict control of blood pressure, blood glucose, and lipid levels, antiplatelet agents, obtaining regular exercise, and continued cessation of smoking.   The patient was advised to call 911 should the patient experience sudden onset abdominal or back pain.   Thank you for allowing [place] to participate in this patient's care.  Clemon Chambers, RN, MSN, FNP-C [facility] Office: [phone]  Clinic Physician: Donzetta Matters  07/29/2017, 8:48  AM

## 2017-07-29 NOTE — Patient Instructions (Addendum)
Before your next abdominal ultrasound:  Take two Extra-Strength Gas-X capsules at bedtime the night before the test. Take another two Extra-Strength Gas-X capsules 3 hours before the test.  Avoid gas forming foods the day before the test.       Abdominal Aortic Aneurysm Blood pumps away from the heart through tubes (blood vessels) called arteries. Aneurysms are weak or damaged places in the wall of an artery. It bulges out like a balloon. An abdominal aortic aneurysm happens in the main artery of the body (aorta). It can burst or tear, causing bleeding inside the body. This is an emergency. It needs treatment right away. What are the causes? The exact cause is unknown. Things that could cause this problem include:  Fat and other substances building up in the lining of a tube.  Swelling of the walls of a blood vessel.  Certain tissue diseases.  Belly (abdominal) trauma.  An infection in the main artery of the body.  What increases the risk? There are things that make it more likely for you to have an aneurysm. These include:  Being over the age of 75 years old.  Having high blood pressure (hypertension).  Being a male.  Being white.  Being very overweight (obese).  Having a family history of aneurysm.  Using tobacco products.  What are the signs or symptoms? Symptoms depend on the size of the aneurysm and how fast it grows. There may not be symptoms. If symptoms occur, they can include:  Pain (belly, side, lower back, or groin).  Feeling full after eating a small amount of food.  Feeling sick to your stomach (nauseous), throwing up (vomiting), or both.  Feeling a lump in your belly that feels like it is beating (pulsating).  Feeling like you will pass out (faint).  How is this treated?  Medicine to control blood pressure and pain.  Imaging tests to see if the aneurysm gets bigger.  Surgery. How is this prevented? To lessen your chance of getting this  condition:  Stop smoking. Stop chewing tobacco.  Limit or avoid alcohol.  Keep your blood pressure, blood sugar, and cholesterol within normal limits.  Eat less salt.  Eat foods low in saturated fats and cholesterol. These are found in animal and whole dairy products.  Eat more fiber. Fiber is found in whole grains, vegetables, and fruits.  Keep a healthy weight.  Stay active and exercise often.  This information is not intended to replace advice given to you by your health care provider. Make sure you discuss any questions you have with your health care provider. Document Released: 09/11/2012 Document Revised: 10/23/2015 Document Reviewed: 06/16/2012 Elsevier Interactive Patient Education  2017 Elsevier Inc.  

## 2017-08-01 ENCOUNTER — Encounter: Payer: Self-pay | Admitting: Family

## 2017-08-02 NOTE — Addendum Note (Signed)
Addended by: Dianey Suchy A on: 08/02/2017 03:40 PM   Modules accepted: Orders  

## 2017-08-09 DIAGNOSIS — R7301 Impaired fasting glucose: Secondary | ICD-10-CM | POA: Diagnosis not present

## 2017-08-09 DIAGNOSIS — I10 Essential (primary) hypertension: Secondary | ICD-10-CM | POA: Diagnosis not present

## 2017-08-09 DIAGNOSIS — E782 Mixed hyperlipidemia: Secondary | ICD-10-CM | POA: Diagnosis not present

## 2017-08-12 DIAGNOSIS — R7303 Prediabetes: Secondary | ICD-10-CM | POA: Diagnosis not present

## 2017-08-12 DIAGNOSIS — E782 Mixed hyperlipidemia: Secondary | ICD-10-CM | POA: Diagnosis not present

## 2017-08-12 DIAGNOSIS — I482 Chronic atrial fibrillation: Secondary | ICD-10-CM | POA: Diagnosis not present

## 2017-08-12 DIAGNOSIS — I10 Essential (primary) hypertension: Secondary | ICD-10-CM | POA: Diagnosis not present

## 2017-08-12 DIAGNOSIS — Q120 Congenital cataract: Secondary | ICD-10-CM | POA: Diagnosis not present

## 2017-08-12 DIAGNOSIS — D181 Lymphangioma, any site: Secondary | ICD-10-CM | POA: Diagnosis not present

## 2017-08-12 DIAGNOSIS — I712 Thoracic aortic aneurysm, without rupture: Secondary | ICD-10-CM | POA: Diagnosis not present

## 2017-12-22 DIAGNOSIS — I482 Chronic atrial fibrillation: Secondary | ICD-10-CM | POA: Diagnosis not present

## 2017-12-22 DIAGNOSIS — E782 Mixed hyperlipidemia: Secondary | ICD-10-CM | POA: Diagnosis not present

## 2017-12-22 DIAGNOSIS — K219 Gastro-esophageal reflux disease without esophagitis: Secondary | ICD-10-CM | POA: Diagnosis not present

## 2017-12-22 DIAGNOSIS — R7303 Prediabetes: Secondary | ICD-10-CM | POA: Diagnosis not present

## 2017-12-22 DIAGNOSIS — I10 Essential (primary) hypertension: Secondary | ICD-10-CM | POA: Diagnosis not present

## 2018-01-31 ENCOUNTER — Ambulatory Visit: Payer: PPO | Admitting: Family

## 2018-01-31 ENCOUNTER — Other Ambulatory Visit (HOSPITAL_COMMUNITY): Payer: PPO

## 2018-02-08 ENCOUNTER — Ambulatory Visit: Payer: PPO | Admitting: Cardiovascular Disease

## 2018-02-08 ENCOUNTER — Encounter: Payer: Self-pay | Admitting: Cardiovascular Disease

## 2018-02-08 VITALS — BP 116/74 | HR 57 | Ht 69.0 in | Wt 209.0 lb

## 2018-02-08 DIAGNOSIS — I4821 Permanent atrial fibrillation: Secondary | ICD-10-CM

## 2018-02-08 DIAGNOSIS — I714 Abdominal aortic aneurysm, without rupture, unspecified: Secondary | ICD-10-CM

## 2018-02-08 DIAGNOSIS — I10 Essential (primary) hypertension: Secondary | ICD-10-CM

## 2018-02-08 DIAGNOSIS — E785 Hyperlipidemia, unspecified: Secondary | ICD-10-CM

## 2018-02-08 DIAGNOSIS — I482 Chronic atrial fibrillation: Secondary | ICD-10-CM

## 2018-02-08 NOTE — Patient Instructions (Addendum)
Your physician wants you to follow-up in: 1 year  with Dr.Koneswaran You will receive a reminder letter in the mail two months in advance. If you don't receive a letter, please call our office to schedule the follow-up appointment.    Your physician recommends that you continue on your current medications as directed. Please refer to the Current Medication list given to you today.    If you need a refill on your cardiac medications before your next appointment, please call your pharmacy.    No labs or testing today.      Thank you for choosing [facility] !

## 2018-02-08 NOTE — Progress Notes (Signed)
SUBJECTIVE: The patient presents for follow-up of permanent atrial fibrillation.  He has a Multimedia programmer.  He has an abdominal aortic aneurysm followed by vascular surgery most recently 4.6 cm in diameter.  Transesophageal echocardiogram on 04/30/16 demonstrated normal left ventricular systolic function, LVEF 65-78%, with mild aortic and mitral regurgitation.  The patient denies any symptoms of chest pain, palpitations, shortness of breath, lightheadedness, dizziness, leg swelling, orthopnea, PND, and syncope.  ECG performed in the office today which I ordered and personally interpreted demonstrates normal rate controlled atrial fibrillation with nonspecific T wave abnormalities, 53 bpm.  He and his wife are driving to [place], [place], and [place].  Their daughter lives in the [place], [place] area and their son lives in [place], [place].  They said he does not work for [employer].  Social history: He and his wife are originally from [place], [place] area. Their daughter lives in the [place], [place] area and their son lives in [place], [place].    Review of Systems: As per "subjective", otherwise negative.  Allergies  Allergen Reactions  . Statins Other (See Comments)    Muscle aches Muscle aches  . Xarelto [Rivaroxaban] Other (See Comments)    Developed redness in both legs    Current Outpatient Medications  Medication Sig Dispense Refill  . acetaminophen (TYLENOL) 500 MG tablet Take 1,000 mg by mouth every 8 (eight) hours as needed (pain).    Marland Kitchen aspirin EC 325 MG tablet Take 325 mg by mouth daily.    . cetirizine (ZYRTEC) 10 MG tablet Take 10 mg by mouth at bedtime.     Marland Kitchen diltiazem (CARDIZEM CD) 240 MG 24 hr capsule Take 240 mg by mouth at bedtime.     . fluticasone (FLONASE) 50 MCG/ACT nasal spray Place 1 spray into both nostrils 2 (two) times daily.     . meclizine (ANTIVERT) 25 MG tablet Take 25 mg by mouth 2 (two) times daily. For dizziness    .  metoprolol succinate (TOPROL-XL) 25 MG 24 hr tablet Take 0.5 tablets (12.5 mg total) by mouth daily.    . Multiple Vitamin (MULTIVITAMIN) tablet Take 1 tablet by mouth daily.    . pantoprazole (PROTONIX) 40 MG tablet TAKE 1 TABLET BY MOUTH DAILY 30 MINUTES PRIOR TO BREAKFAST. 30 tablet 3  . pravastatin (PRAVACHOL) 20 MG tablet TAKE ONE TABLET BY MOUTH ONCE EVERY EVENING. 90 tablet 3  . traZODone (DESYREL) 50 MG tablet Take 50 mg by mouth at bedtime.    . triamterene-hydrochlorothiazide (MAXZIDE-25) 37.5-25 MG per tablet Take 1 tablet by mouth daily.    . Vitamin D, Cholecalciferol, 1000 units TABS Take 1,000 Units by mouth at bedtime.     No current facility-administered medications for this visit.     Past Medical History:  Diagnosis Date  . AAA (abdominal aortic aneurysm) ([place])   . Aneurysm ([place])   . Arthritis   . Degenerative joint disease   . History of blood transfusion 12/2007   "bleeding for Warfarin"  . Hyperlipidemia   . Hypertension    Admitted for chest pain and 06/2011-neg stress Myoview,mod. LVH with nl EF on echo; TSH of 3.75  . Meniere's disease   . Nephrolithiasis   . Peripheral vascular disease ([place])    Small AAA (3.7-4cm 09/2010), small SMA aneurysm (26mm)  . Permanent atrial fibrillation ([place])    a. s/p Watchman  . Renal hemorrhage, right 2009   2009 while anticoagulated with Coumadin; treated with the renal artery embolization  Past Surgical History:  Procedure Laterality Date  . BIOPSY  02/21/2017   Procedure: BIOPSY;  Surgeon: Danie Binder, MD;  [facility];  Service: Endoscopy;;  gastric  . COLONOSCOPY N/A 02/21/2017   multiple polyps, one requiring piecemeal removal with epi and clip placement, multiple small and large mouthed diverticula, recto-sigmoid and sigmoid with significant excess looping, internal hemorrhoids. Path with one advanced adenoma, 3 simple adenomas, 2 hyperplastic polyps. Survelilance 3 years   . EMBOLIZATION  12/2007    Left renal hematoma, status post angiogram and embolization/notes 09/29/2010  . ESOPHAGOGASTRODUODENOSCOPY N/A 02/21/2017   benign-appearing esophageal stenosis s/p dilation, gastritis, scarred mucosa in pylorus due to prior PUD. reactive gastropathy on path  . FRACTURE SURGERY    . LEFT ATRIAL APPENDAGE OCCLUSION N/A 03/18/2016   Procedure: LEFT ATRIAL APPENDAGE OCCLUSION;  Surgeon: [name], MD;  Location: [facility];  Service: Cardiovascular;  Laterality: N/A;  . ORIF FEMUR FRACTURE Right 1960   "2 steel rods in leg"  . PILONIDAL CYST EXCISION    . POLYPECTOMY  02/21/2017   Procedure: POLYPECTOMY;  Surgeon: Danie Binder, MD;  [facility];  Service: Endoscopy;;  colon  . SAVORY DILATION N/A 02/21/2017   Procedure: SAVORY DILATION;  Surgeon: Danie Binder, MD;  [facility];  Service: Endoscopy;  Laterality: N/A;  . TEE WITHOUT CARDIOVERSION N/A 03/10/2016   Procedure: TRANSESOPHAGEAL ECHOCARDIOGRAM (TEE);  Surgeon: Josue Hector, MD;  Location: [facility];  Service: Cardiovascular;  Laterality: N/A;  . TEE WITHOUT CARDIOVERSION N/A 04/30/2016   Procedure: TRANSESOPHAGEAL ECHOCARDIOGRAM (TEE);  Surgeon: Josue Hector, MD;  Location: [facility];  Service: Cardiovascular;  Laterality: N/A;  . Watchman procedure  02/2016    Social History   Socioeconomic History  . Marital status: Married    Spouse name: Not on file  . Number of children: 2  . Years of education: Not on file  . Highest education level: Not on file  Occupational History  . Occupation: Retired    Comment: Chief Technology Officer job  Social Needs  . Financial resource strain: Not on file  . Food insecurity:    Worry: Not on file    Inability: Not on file  . Transportation needs:    Medical: Not on file    Non-medical: Not on file  Tobacco Use  . Smoking status: Former Smoker    Packs/day: 1.00    Years: 40.00    Pack years: 40.00    Last attempt to quit: 01/20/2008      Years since quitting: 10.0  . Smokeless tobacco: Never Used  Substance and Sexual Activity  . Alcohol use: Yes    Alcohol/week: 1.0 standard drinks    Types: 1 Standard drinks or equivalent per week    Comment: rare wine  . Drug use: No  . Sexual activity: Yes  Lifestyle  . Physical activity:    Days per week: Not on file    Minutes per session: Not on file  . Stress: Not on file  Relationships  . Social connections:    Talks on phone: Not on file    Gets together: Not on file    Attends religious service: Not on file    Active member of club or organization: Not on file    Attends meetings of clubs or organizations: Not on file    Relationship status: Not on file  . Intimate partner violence:    Fear of current  or ex partner: Not on file    Emotionally abused: Not on file    Physically abused: Not on file    Forced sexual activity: Not on file  Other Topics Concern  . Not on file  Social History Narrative   Retired from Architect   Lives in [place]:   02/08/18 1025  BP: 116/74  Pulse: (!) 57  SpO2: 95%  Weight: 209 lb (94.8 kg)  Height: 5\' 9"  (1.753 m)    Wt Readings from Last 3 Encounters:  02/08/18 209 lb (94.8 kg)  07/29/17 204 lb (92.5 kg)  06/07/17 209 lb 3.2 oz (94.9 kg)     PHYSICAL EXAM General: NAD HEENT: Normal. Neck: No JVD, no thyromegaly. Lungs: Clear to auscultation bilaterally with normal respiratory effort. CV: Regular rate and irregular rhythm, normal S1/S2, no S3, no murmur. No pretibial or periankle edema.  No carotid bruit.   Abdomen: Soft, nontender, no distention.  Neurologic: Alert and oriented.  Psych: Normal affect. Skin: Normal. Musculoskeletal: No gross deformities.    ECG: Reviewed above under Subjective   Labs: Lab Results  Component Value Date/Time   K 3.4 (L) 04/07/2016 06:08 AM   BUN 13 04/07/2016 06:08 AM   CREATININE 0.84 04/07/2016 06:08 AM   CREATININE 1.30 12/08/2012 07:05 AM   ALT 17  04/04/2016 08:18 PM   TSH 3.758 06/19/2012 07:15 AM   HGB 11.1 (L) 04/07/2016 06:08 AM     Lipids: Lab Results  Component Value Date/Time   LDLCALC 91 12/08/2012 07:05 AM   CHOL 161 12/08/2012 07:05 AM   TRIG 165 (H) 12/08/2012 07:05 AM   HDL 37 (L) 12/08/2012 07:05 AM       ASSESSMENT AND PLAN:  1.  Permanent atrial fibrillation: Symptomatically stable. Heart rate is controlled on long-acting diltiazem and long-acting metoprolol. He is maintained on aspirin.  2. Hypertension: Controlled. No changes.  3. Hyperlipidemia:Continue statin therapy.  4. Abdominal aortic aneurysm: Measures 4.6 cm in diameter most recently. Followed by vascular surgery.     Disposition: Follow up 1 year   Kate Sable, M.D., F.A.C.C.

## 2018-02-08 NOTE — Progress Notes (Signed)
ekg 

## 2018-02-09 DIAGNOSIS — E782 Mixed hyperlipidemia: Secondary | ICD-10-CM | POA: Diagnosis not present

## 2018-02-09 DIAGNOSIS — I959 Hypotension, unspecified: Secondary | ICD-10-CM | POA: Diagnosis not present

## 2018-02-09 DIAGNOSIS — I10 Essential (primary) hypertension: Secondary | ICD-10-CM | POA: Diagnosis not present

## 2018-02-09 DIAGNOSIS — Q120 Congenital cataract: Secondary | ICD-10-CM | POA: Diagnosis not present

## 2018-02-09 DIAGNOSIS — I482 Chronic atrial fibrillation: Secondary | ICD-10-CM | POA: Diagnosis not present

## 2018-02-09 DIAGNOSIS — R7303 Prediabetes: Secondary | ICD-10-CM | POA: Diagnosis not present

## 2018-02-09 DIAGNOSIS — D181 Lymphangioma, any site: Secondary | ICD-10-CM | POA: Diagnosis not present

## 2018-02-09 DIAGNOSIS — J069 Acute upper respiratory infection, unspecified: Secondary | ICD-10-CM | POA: Diagnosis not present

## 2018-02-09 DIAGNOSIS — K829 Disease of gallbladder, unspecified: Secondary | ICD-10-CM | POA: Diagnosis not present

## 2018-02-09 DIAGNOSIS — I712 Thoracic aortic aneurysm, without rupture: Secondary | ICD-10-CM | POA: Diagnosis not present

## 2018-02-09 DIAGNOSIS — I719 Aortic aneurysm of unspecified site, without rupture: Secondary | ICD-10-CM | POA: Diagnosis not present

## 2018-02-09 DIAGNOSIS — R7301 Impaired fasting glucose: Secondary | ICD-10-CM | POA: Diagnosis not present

## 2018-02-14 DIAGNOSIS — Z86010 Personal history of colonic polyps: Secondary | ICD-10-CM | POA: Diagnosis not present

## 2018-02-14 DIAGNOSIS — Z0001 Encounter for general adult medical examination with abnormal findings: Secondary | ICD-10-CM | POA: Diagnosis not present

## 2018-02-14 DIAGNOSIS — Z8711 Personal history of peptic ulcer disease: Secondary | ICD-10-CM | POA: Diagnosis not present

## 2018-02-14 DIAGNOSIS — I10 Essential (primary) hypertension: Secondary | ICD-10-CM | POA: Diagnosis not present

## 2018-02-14 DIAGNOSIS — R944 Abnormal results of kidney function studies: Secondary | ICD-10-CM | POA: Diagnosis not present

## 2018-02-14 DIAGNOSIS — E782 Mixed hyperlipidemia: Secondary | ICD-10-CM | POA: Diagnosis not present

## 2018-02-14 DIAGNOSIS — Q120 Congenital cataract: Secondary | ICD-10-CM | POA: Diagnosis not present

## 2018-02-14 DIAGNOSIS — R7301 Impaired fasting glucose: Secondary | ICD-10-CM | POA: Diagnosis not present

## 2018-02-14 DIAGNOSIS — I712 Thoracic aortic aneurysm, without rupture: Secondary | ICD-10-CM | POA: Diagnosis not present

## 2018-02-14 DIAGNOSIS — D181 Lymphangioma, any site: Secondary | ICD-10-CM | POA: Diagnosis not present

## 2018-02-14 DIAGNOSIS — I482 Chronic atrial fibrillation: Secondary | ICD-10-CM | POA: Diagnosis not present

## 2018-02-24 ENCOUNTER — Other Ambulatory Visit (HOSPITAL_COMMUNITY): Payer: PPO

## 2018-02-24 ENCOUNTER — Ambulatory Visit: Payer: PPO | Admitting: Family

## 2018-03-29 ENCOUNTER — Emergency Department (HOSPITAL_COMMUNITY)
Admission: EM | Admit: 2018-03-29 | Discharge: 2018-03-29 | Disposition: A | Discharge status: Home / Self Care | Payer: PPO | Source: Home / Self Care | Attending: Emergency Medicine | Admitting: Emergency Medicine

## 2018-03-29 ENCOUNTER — Encounter (HOSPITAL_COMMUNITY): Payer: Self-pay | Admitting: Emergency Medicine

## 2018-03-29 ENCOUNTER — Other Ambulatory Visit: Payer: Self-pay

## 2018-03-29 ENCOUNTER — Emergency Department (HOSPITAL_COMMUNITY): Payer: PPO

## 2018-03-29 ENCOUNTER — Emergency Department (HOSPITAL_COMMUNITY)
Admission: EM | Admit: 2018-03-29 | Discharge: 2018-03-29 | Disposition: A | Discharge status: Home / Self Care | Payer: PPO | Attending: Emergency Medicine | Admitting: Emergency Medicine

## 2018-03-29 DIAGNOSIS — I10 Essential (primary) hypertension: Secondary | ICD-10-CM | POA: Insufficient documentation

## 2018-03-29 DIAGNOSIS — T887XXA Unspecified adverse effect of drug or medicament, initial encounter: Secondary | ICD-10-CM

## 2018-03-29 DIAGNOSIS — T7840XA Allergy, unspecified, initial encounter: Secondary | ICD-10-CM

## 2018-03-29 DIAGNOSIS — Z79899 Other long term (current) drug therapy: Secondary | ICD-10-CM | POA: Diagnosis not present

## 2018-03-29 DIAGNOSIS — R0602 Shortness of breath: Secondary | ICD-10-CM | POA: Diagnosis not present

## 2018-03-29 DIAGNOSIS — D010 Carcinoma in situ of colon: Secondary | ICD-10-CM | POA: Diagnosis not present

## 2018-03-29 DIAGNOSIS — Y69 Unspecified misadventure during surgical and medical care: Secondary | ICD-10-CM | POA: Diagnosis not present

## 2018-03-29 DIAGNOSIS — Z87891 Personal history of nicotine dependence: Secondary | ICD-10-CM | POA: Insufficient documentation

## 2018-03-29 DIAGNOSIS — R221 Localized swelling, mass and lump, neck: Secondary | ICD-10-CM | POA: Diagnosis not present

## 2018-03-29 DIAGNOSIS — Z7902 Long term (current) use of antithrombotics/antiplatelets: Secondary | ICD-10-CM | POA: Diagnosis not present

## 2018-03-29 DIAGNOSIS — R1310 Dysphagia, unspecified: Secondary | ICD-10-CM | POA: Diagnosis not present

## 2018-03-29 DIAGNOSIS — Z7982 Long term (current) use of aspirin: Secondary | ICD-10-CM | POA: Diagnosis not present

## 2018-03-29 DIAGNOSIS — R220 Localized swelling, mass and lump, head: Secondary | ICD-10-CM | POA: Diagnosis not present

## 2018-03-29 LAB — BASIC METABOLIC PANEL
Anion gap: 11 (ref 5–15)
BUN: 15 mg/dL (ref 8–23)
CO2: 26 mmol/L (ref 22–32)
Calcium: 9.1 mg/dL (ref 8.9–10.3)
Chloride: 100 mmol/L (ref 98–111)
Creatinine, Ser: 1.1 mg/dL (ref 0.61–1.24)
GFR calc Af Amer: >60 mL/min (ref >60)
GFR calc non Af Amer: >60 mL/min (ref >60)
Glucose, Bld: 124 mg/dL — ABNORMAL HIGH (ref 70–99)
Potassium: 4.3 mmol/L (ref 3.5–5.1)
Sodium: 137 mmol/L (ref 135–145)

## 2018-03-29 LAB — CBC WITH DIFFERENTIAL/PLATELET
Abs Immature Granulocytes: 0.02 10*3/uL (ref 0.00–0.07)
Basophils Absolute: 0 10*3/uL (ref 0.0–0.1)
Basophils Relative: 0 %
Eosinophils Absolute: 0.1 10*3/uL (ref 0.0–0.5)
Eosinophils Relative: 2 %
HCT: 41.6 % (ref 39.0–52.0)
Hemoglobin: 13.6 g/dL (ref 13.0–17.0)
Immature Granulocytes: 0 %
Lymphocytes Relative: 9 %
Lymphs Abs: 0.5 10*3/uL — ABNORMAL LOW (ref 0.7–4.0)
MCH: 29.8 pg (ref 26.0–34.0)
MCHC: 32.7 g/dL (ref 30.0–36.0)
MCV: 91.2 fL (ref 80.0–100.0)
Monocytes Absolute: 0.5 10*3/uL (ref 0.1–1.0)
Monocytes Relative: 10 %
Neutro Abs: 4.1 10*3/uL (ref 1.7–7.7)
Neutrophils Relative %: 79 %
Platelets: 168 10*3/uL (ref 150–400)
RBC: 4.56 MIL/uL (ref 4.22–5.81)
RDW: 13.2 % (ref 11.5–15.5)
WBC: 5.1 10*3/uL (ref 4.0–10.5)
nRBC: 0 % (ref 0.0–0.2)

## 2018-03-29 LAB — TROPONIN I
Troponin I: <0.03 ng/mL (ref <0.03)
Troponin I: <0.03 ng/mL (ref <0.03)

## 2018-03-29 LAB — BRAIN NATRIURETIC PEPTIDE: B Natriuretic Peptide: 65 pg/mL (ref 0.0–100.0)

## 2018-03-29 MED ORDER — FAMOTIDINE IN NACL 20-0.9 MG/50ML-% IV SOLN
20.0000 mg | Freq: Once | INTRAVENOUS | Status: AC
  Administered 2018-03-29: 20 mg via INTRAVENOUS
  Filled 2018-03-29: qty 50

## 2018-03-29 MED ORDER — PREDNISONE 20 MG PO TABS: 40.0000 mg | ORAL_TABLET | Freq: Every day | ORAL | 0 refills | Status: DC

## 2018-03-29 MED ORDER — DIPHENHYDRAMINE HCL 50 MG/ML IJ SOLN
25.0000 mg | Freq: Once | INTRAMUSCULAR | Status: AC
  Administered 2018-03-29: 25 mg via INTRAVENOUS
  Filled 2018-03-29: qty 1

## 2018-03-29 MED ORDER — METHYLPREDNISOLONE SODIUM SUCC 125 MG IJ SOLR
125.0000 mg | Freq: Once | INTRAMUSCULAR | Status: AC
  Administered 2018-03-29: 125 mg via INTRAVENOUS
  Filled 2018-03-29: qty 2

## 2018-03-29 NOTE — ED Provider Notes (Signed)
[facility] EMERGENCY DEPARTMENT Provider Note   CSN: [id number] Arrival date & time: 03/29/18  1314     History   Chief Complaint Chief Complaint  Patient presents with  . Allergic Reaction    HPI [name] is a 77 y.o. male.  HPI  Pt was seen at 1345. Per pt, c/o gradual onset and persistence of multiple intermittent episodes of "throat swelling" for the past 10 days. Pt states this has been occurring daily for the past 10 days. Pt states it has been happening every night after he takes his nightly medications, and intermittently throughout the days. Symptom of "it feels like my throat is swelling up," "has difficulty swallowing" and "SOB" lasts for 3 to 4 hours each episode before resolving. Pt states his symptoms resolve when he "moves around and is active." Pt thought it was related to a change in his trazodone brand, but he has not taken this medication for the past 2 days and continues to have symptoms. Pt states his current symptoms started shortly after he took his usual meds at 9am today. Pt denies any changes in these medications. Pt has not taken any medications to treat his symptoms. Denies CP/palpitations, no cough, no drooling, no hoarse voice, no stridor/wheezing, no abd pain, no N/V/D, no back pain, no neck pain, no rash, no fevers.     Past Medical History:  Diagnosis Date  . AAA (abdominal aortic aneurysm) ([place])   . Aneurysm ([place])   . Arthritis   . Degenerative joint disease   . History of blood transfusion 12/2007   "bleeding for Warfarin"  . Hyperlipidemia   . Hypertension    Admitted for chest pain and 06/2011-neg stress Myoview,mod. LVH with nl EF on echo; TSH of 3.75  . Meniere's disease   . Nephrolithiasis   . Peripheral vascular disease ([place])    Small AAA (3.7-4cm 09/2010), small SMA aneurysm (64mm)  . Permanent atrial fibrillation    a. s/p Watchman  . Renal hemorrhage, right 2009   2009 while anticoagulated with Coumadin; treated with the  renal artery embolization    Patient Active Problem List   Diagnosis Date Noted  . History of colon polyps 06/07/2017  . Gastritis   . Positive colorectal cancer screening using Cologuard test 01/04/2017  . Dysphagia 01/04/2017  . CAP (community acquired pneumonia) 04/04/2016  . Sepsis ([facility]) 04/04/2016  . Hyponatremia 04/04/2016  . AKI (acute kidney injury) ([place]) 04/04/2016  . Hyperglycemia 04/04/2016  . Abdominal aortic aneurysm ([place]) 03/24/2012  . Fasting hyperglycemia 03/24/2012  . Peripheral vascular disease ([place]) 09/20/2011  . Chest pain 07/17/2011  . Hypertension   . Hyperlipidemia   . Chronic atrial fibrillation ([place])   . Meniere's disease   . Renal hemorrhage, right     Past Surgical History:  Procedure Laterality Date  . BIOPSY  02/21/2017   Procedure: BIOPSY;  Surgeon: Danie Binder, MD;  [facility];  Service: Endoscopy;;  gastric  . COLONOSCOPY N/A 02/21/2017   multiple polyps, one requiring piecemeal removal with epi and clip placement, multiple small and large mouthed diverticula, recto-sigmoid and sigmoid with significant excess looping, internal hemorrhoids. Path with one advanced adenoma, 3 simple adenomas, 2 hyperplastic polyps. Survelilance 3 years   . EMBOLIZATION  12/2007   Left renal hematoma, status post angiogram and embolization/notes 09/29/2010  . ESOPHAGOGASTRODUODENOSCOPY N/A 02/21/2017   benign-appearing esophageal stenosis s/p dilation, gastritis, scarred mucosa in pylorus due to prior PUD. reactive gastropathy on path  .  FRACTURE SURGERY    . LEFT ATRIAL APPENDAGE OCCLUSION N/A 03/18/2016   Procedure: LEFT ATRIAL APPENDAGE OCCLUSION;  Surgeon: [name], MD;  Location: [facility];  Service: Cardiovascular;  Laterality: N/A;  . ORIF FEMUR FRACTURE Right 1960   "2 steel rods in leg"  . PILONIDAL CYST EXCISION    . POLYPECTOMY  02/21/2017   Procedure: POLYPECTOMY;  Surgeon: Danie Binder, MD;  [facility];   Service: Endoscopy;;  colon  . SAVORY DILATION N/A 02/21/2017   Procedure: SAVORY DILATION;  Surgeon: Danie Binder, MD;  [facility];  Service: Endoscopy;  Laterality: N/A;  . TEE WITHOUT CARDIOVERSION N/A 03/10/2016   Procedure: TRANSESOPHAGEAL ECHOCARDIOGRAM (TEE);  Surgeon: Josue Hector, MD;  Location: [facility];  Service: Cardiovascular;  Laterality: N/A;  . TEE WITHOUT CARDIOVERSION N/A 04/30/2016   Procedure: TRANSESOPHAGEAL ECHOCARDIOGRAM (TEE);  Surgeon: Josue Hector, MD;  Location: [facility];  Service: Cardiovascular;  Laterality: N/A;  . Watchman procedure  02/2016        Home Medications    Prior to Admission medications   Medication Sig Start Date End Date Taking? Authorizing Provider  acetaminophen (TYLENOL) 500 MG tablet Take 1,000 mg by mouth every 8 (eight) hours as needed (pain).    [provider]  aspirin EC 325 MG tablet Take 325 mg by mouth daily.    [provider]  cetirizine (ZYRTEC) 10 MG tablet Take 10 mg by mouth at bedtime.     [provider]  diltiazem (CARDIZEM CD) 240 MG 24 hr capsule Take 240 mg by mouth at bedtime.     [provider]  fluticasone (FLONASE) 50 MCG/ACT nasal spray Place 1 spray into both nostrils 2 (two) times daily.  02/23/16   [provider]  meclizine (ANTIVERT) 25 MG tablet Take 25 mg by mouth 2 (two) times daily. For dizziness    [provider]  metoprolol succinate (TOPROL-XL) 25 MG 24 hr tablet Take 0.5 tablets (12.5 mg total) by mouth daily. 03/25/16   Allred, Jeneen Rinks, MD  Multiple Vitamin (MULTIVITAMIN) tablet Take 1 tablet by mouth daily.    [provider]  pantoprazole (PROTONIX) 40 MG tablet TAKE 1 TABLET BY MOUTH DAILY 30 MINUTES PRIOR TO BREAKFAST. 07/11/17   Annitta Needs, NP  pravastatin (PRAVACHOL) 20 MG tablet TAKE ONE TABLET BY MOUTH ONCE EVERY EVENING. 04/03/13   Lendon Colonel, NP  traZODone (DESYREL) 50 MG tablet Take 50 mg by  mouth at bedtime.    [provider]  triamterene-hydrochlorothiazide (MAXZIDE-25) 37.5-25 MG per tablet Take 1 tablet by mouth daily. 09/20/11   Yehuda Savannah, MD  Vitamin D, Cholecalciferol, 1000 units TABS Take 1,000 Units by mouth at bedtime.    [provider]    Family History Family History  Problem Relation Age of Onset  . Hypertension Mother   . Hyperlipidemia Mother   . Renal cancer Mother   . Atrial fibrillation Sister   . Stroke Father   . Colon cancer Cousin   . Colon polyps Neg Hx     Social History Social History   Tobacco Use  . Smoking status: Former Smoker    Packs/day: 1.00    Years: 40.00    Pack years: 40.00    Last attempt to quit: 01/20/2008    Years since quitting: 10.1  . Smokeless tobacco: Never Used  Substance Use Topics  . Alcohol use: Not Currently  Alcohol/week: 1.0 standard drinks    Types: 1 Standard drinks or equivalent per week    Frequency: Never    Comment: rare wine  . Drug use: No     Allergies   Statins and Xarelto [rivaroxaban]   Review of Systems Review of Systems ROS: Statement: All systems negative except as marked or noted in the HPI; Constitutional: Negative for fever and chills. ; ; Eyes: Negative for eye pain, redness and discharge. ; ; ENMT: Negative for ear pain, hoarseness, nasal congestion, sinus pressure and sore throat. +"throat swelling sensation." ; ; Cardiovascular: Negative for chest pain, palpitations, diaphoresis, and peripheral edema. ; ; Respiratory: +SOB. Negative for cough, wheezing and stridor. ; ; Gastrointestinal: Negative for nausea, vomiting, diarrhea, abdominal pain, blood in stool, hematemesis, jaundice and rectal bleeding. . ; ; Genitourinary: Negative for dysuria, flank pain and hematuria. ; ; Musculoskeletal: Negative for back pain and neck pain. Negative for swelling and trauma.; ; Skin: Negative for pruritus, rash, abrasions, blisters, bruising and skin lesion.; ; Neuro:  Negative for headache, lightheadedness and neck stiffness. Negative for weakness, altered level of consciousness, altered mental status, extremity weakness, paresthesias, involuntary movement, seizure and syncope.       Physical Exam Updated Vital Signs BP 107/90 (BP Location: Right Arm)   Pulse 66   Temp (!) 97.5 F (36.4 C) (Oral)   Resp 16   SpO2 97%   Physical Exam 1350: Physical examination:  Nursing notes reviewed; Vital signs and O2 SAT reviewed;  Constitutional: Well developed, Well nourished, Well hydrated, In no acute distress; Head:  Normocephalic, atraumatic; Eyes: EOMI, PERRL, No scleral icterus; ENMT: Mouth and pharynx normal, Mucous membranes moist. Mouth and pharynx without lesions. No tonsillar exudates. No intra-oral edema. No submandibular or sublingual edema. No hoarse voice, no drooling, no stridor. No pain with manipulation of larynx. No trismus.;;; Neck: Supple, Full range of motion, No lymphadenopathy; Cardiovascular: Irregular rate and rhythm, No gallop; Respiratory: Breath sounds clear & equal bilaterally, No wheezes.  Speaking full sentences with ease, Normal respiratory effort/excursion; Chest: Nontender, Movement normal; Abdomen: Soft, Nontender, Nondistended, Normal bowel sounds; Genitourinary: No CVA tenderness; Extremities: Peripheral pulses normal, No tenderness, No edema, No calf edema or asymmetry.; Neuro: AA&Ox3, Major CN grossly intact.  Speech clear. No gross focal motor or sensory deficits in extremities.; Skin: Color normal, Warm, Dry.; Psych:  Affect flat, poor eye contact.      ED Treatments / Results  Labs (all labs ordered are listed, but only abnormal results are displayed)   EKG EKG Interpretation  Date/Time:  Wednesday March 29 2018 13:58:14 EDT Ventricular Rate:  44 PR Interval:    QRS Duration: 94 QT Interval:  389 QTC Calculation: 333 R Axis:   66 Text Interpretation:  Atrial fibrillation Borderline T abnormalities, lateral leads  Artifact When compared with ECG of 03/19/2016 Rate slower Otherwise no significant change Confirmed by Francine Graven [phone]) on 03/29/2018 2:04:34 PM   Radiology   Procedures Procedures (including critical care time)  Medications Ordered in ED Medications  diphenhydrAMINE (BENADRYL) injection 25 mg (has no administration in time range)  methylPREDNISolone sodium succinate (SOLU-MEDROL) 125 mg/2 mL injection 125 mg (has no administration in time range)  famotidine (PEPCID) IVPB 20 mg premix (has no administration in time range)     Initial Impression / Assessment and Plan / ED Course  I have reviewed the triage vital signs and the nursing notes.  Pertinent labs & imaging results that were available during my care of  the patient were reviewed by me and considered in my medical decision making (see chart for details).  MDM Reviewed: previous chart, nursing note and vitals Reviewed previous: labs and ECG Interpretation: labs, ECG and x-ray    Results for orders placed or performed during the hospital encounter of 16/10/96  Basic metabolic panel  Result Value Ref Range   Sodium 137 135 - 145 mmol/L   Potassium 4.3 3.5 - 5.1 mmol/L   Chloride 100 98 - 111 mmol/L   CO2 26 22 - 32 mmol/L   Glucose, Bld 124 (H) 70 - 99 mg/dL   BUN 15 8 - 23 mg/dL   Creatinine, Ser 1.10 0.61 - 1.24 mg/dL   Calcium 9.1 8.9 - 10.3 mg/dL   GFR calc non Af Amer >60 >60 mL/min   GFR calc Af Amer >60 >60 mL/min   Anion gap 11 5 - 15  Brain natriuretic peptide  Result Value Ref Range   B Natriuretic Peptide 65.0 0.0 - 100.0 pg/mL  Troponin I  Result Value Ref Range   Troponin I <0.03 <0.03 ng/mL  CBC with Differential  Result Value Ref Range   WBC 5.1 4.0 - 10.5 K/uL   RBC 4.56 4.22 - 5.81 MIL/uL   Hemoglobin 13.6 13.0 - 17.0 g/dL   HCT 41.6 39.0 - 52.0 %   MCV 91.2 80.0 - 100.0 fL   MCH 29.8 26.0 - 34.0 pg   MCHC 32.7 30.0 - 36.0 g/dL   RDW 13.2 11.5 - 15.5 %   Platelets 168 150 - 400  K/uL   nRBC 0.0 0.0 - 0.2 %   Neutrophils Relative % 79 %   Neutro Abs 4.1 1.7 - 7.7 K/uL   Lymphocytes Relative 9 %   Lymphs Abs 0.5 (L) 0.7 - 4.0 K/uL   Monocytes Relative 10 %   Monocytes Absolute 0.5 0.1 - 1.0 K/uL   Eosinophils Relative 2 %   Eosinophils Absolute 0.1 0.0 - 0.5 K/uL   Basophils Relative 0 %   Basophils Absolute 0.0 0.0 - 0.1 K/uL   Immature Granulocytes 0 %   Abs Immature Granulocytes 0.02 0.00 - 0.07 K/uL  Troponin I  Result Value Ref Range   Troponin I <0.03 <0.03 ng/mL   Dg Neck Soft Tissue Result Date: 03/29/2018 CLINICAL DATA:  Throat and tongue swelling sensations. Some shortness of breath and dysphagia. EXAM: NECK SOFT TISSUES - 1+ VIEW COMPARISON:  None. FINDINGS: Normal appearing prevertebral soft tissues and airway. No visible soft tissue swelling. The epiglottis has a normal appearance. Mild cervical spine degenerative changes. Bilateral carotid artery calcifications. IMPRESSION: 1. No acute abnormality. 2. Bilateral carotid artery atheromatous calcifications. Electronically Signed   By: Claudie Revering M.D.   On: 03/29/2018 14:35   Dg Chest 2 View Result Date: 03/29/2018 CLINICAL DATA:  Swelling sensation in the throat and tongue with some shortness of breath. EXAM: CHEST - 2 VIEW COMPARISON:  04/04/2016. FINDINGS: Stable enlarged cardiac silhouette and tortuous and partially calcified thoracic aorta. Clear lungs. The interstitial markings remain mildly prominent with normal vascularity. Mild diffuse peribronchial thickening without significant change. Stable left atrial occlusion device. Unremarkable bones. IMPRESSION: No acute abnormality. Stable cardiomegaly and mild chronic bronchitic changes. Electronically Signed   By: Claudie Revering M.D.   On: 03/29/2018 14:34    1700:  Feels better after meds and wants to go home now. Doubt ACS as cause for recurrent symptoms over the past 10 days given atypical symptoms, unchanged EKG from previous  and troponin x2  normal. Will continue to tx for possible allergic rxn. Dx and testing d/w pt and family.  Questions answered.  Verb understanding, agreeable to d/c home with outpt f/u.      Final Clinical Impressions(s) / ED Diagnoses   Final diagnoses:  None    ED Discharge Orders    None       Francine Graven, DO 04/03/18 1546

## 2018-03-29 NOTE — Discharge Instructions (Addendum)
Take the prescription as directed.  Take over the counter benadryl, as directed on packaging, as needed for "throat swelling" sensation.  If the benadryl is too sedating, take an over the counter non-sedating antihistamine such as claritin, allegra or zyrtec, as directed on packaging.  Call your regular medical doctor tomorrow to schedule a follow up appointment within the next 2 days.  Return to the Emergency Department immediately sooner if worsening.

## 2018-03-29 NOTE — ED Provider Notes (Signed)
[facility] EMERGENCY DEPARTMENT Provider Note   CSN: [id number] Arrival date & time: 03/29/18  0035  Time seen 01:58 AM    History   Chief Complaint Chief Complaint  Patient presents with  . Allergic Reaction    HPI [name] is a 77 y.o. male.  HPI patient states since he got his trazodone refilled on October 5 he has been having problems shortly after taking the pills.  He states the pills now look different from what he had been on before.  He states the pills used to be larger.  His wife has already destroyed the label off the usual brand that he takes.  He states that he usually takes his evening medicine around 830 to 9 PM.  His evening medications are diltiazem, trazodone, pravastatin, meclizine, and Zyrtec.  However since he got the new trazodone bottle filled about 20 to 30 minutes later he starts feeling a tightness in his throat and feels like he is choking and states it lasts about 3 to 4 hours.  Tonight he took his medications about 8:45 PM and again had the tightness in his throat and states his tongue felt like it was swollen and he had swelling in his throat.  He states he was unable to swallow.  His symptoms however have improved now.  They report no other new medications or exposures.  PCP Celene Squibb, MD   Past Medical History:  Diagnosis Date  . AAA (abdominal aortic aneurysm) ([place])   . Aneurysm ([place])   . Arthritis   . Degenerative joint disease   . History of blood transfusion 12/2007   "bleeding for Warfarin"  . Hyperlipidemia   . Hypertension    Admitted for chest pain and 06/2011-neg stress Myoview,mod. LVH with nl EF on echo; TSH of 3.75  . Meniere's disease   . Nephrolithiasis   . 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 Permanent atrial fibrillation    a. s/p Watchman  . Renal hemorrhage, right 2009   2009 while anticoagulated with Coumadin; treated with the renal artery embolization     Patient Active Problem List   Diagnosis Date Noted  . History of colon polyps 06/07/2017  . Gastritis   . Positive colorectal cancer screening using Cologuard test 01/04/2017  . Dysphagia 01/04/2017  . CAP (community acquired pneumonia) 04/04/2016  . Sepsis ([place]) 04/04/2016  . Hyponatremia 04/04/2016  . AKI (acute kidney injury) ([place]) 04/04/2016  . Hyperglycemia 04/04/2016  . Abdominal aortic aneurysm ([place]) 03/24/2012  . Fasting hyperglycemia 03/24/2012  . Peripheral vascular disease ([place]) 09/20/2011  . Chest pain 07/17/2011  . Hypertension   . Hyperlipidemia   . Chronic atrial fibrillation ([place])   . Meniere's disease   . Renal hemorrhage, right     Past Surgical History:  Procedure Laterality Date  . BIOPSY  02/21/2017   Procedure: BIOPSY;  Surgeon: Danie Binder, MD;  [facility];  Service: Endoscopy;;  gastric  . COLONOSCOPY N/A 02/21/2017   multiple polyps, one requiring piecemeal removal with epi and clip placement, multiple small and large mouthed diverticula, recto-sigmoid and sigmoid with significant excess looping, internal hemorrhoids. Path with one advanced adenoma, 3 simple adenomas, 2 hyperplastic polyps. Survelilance 3 years   . EMBOLIZATION  12/2007   Left renal hematoma, status post angiogram and embolization/notes 09/29/2010  . ESOPHAGOGASTRODUODENOSCOPY N/A 02/21/2017   benign-appearing esophageal stenosis s/p dilation, gastritis, scarred mucosa in pylorus  due to prior PUD. reactive gastropathy on path  . FRACTURE SURGERY    . LEFT ATRIAL APPENDAGE OCCLUSION N/A 03/18/2016   Procedure: LEFT ATRIAL APPENDAGE OCCLUSION;  Surgeon: [name], MD;  Location: [facility];  Service: Cardiovascular;  Laterality: N/A;  . ORIF FEMUR FRACTURE Right 1960   "2 steel rods in leg"  . PILONIDAL CYST EXCISION    . POLYPECTOMY  02/21/2017   Procedure: POLYPECTOMY;  Surgeon: Danie Binder, MD;  [facility];  Service: Endoscopy;;  colon  .  SAVORY DILATION N/A 02/21/2017   Procedure: SAVORY DILATION;  Surgeon: Danie Binder, MD;  [facility];  Service: Endoscopy;  Laterality: N/A;  . TEE WITHOUT CARDIOVERSION N/A 03/10/2016   Procedure: TRANSESOPHAGEAL ECHOCARDIOGRAM (TEE);  Surgeon: Josue Hector, MD;  Location: [facility] [place];  Service: Cardiovascular;  Laterality: N/A;  . TEE WITHOUT CARDIOVERSION N/A 04/30/2016   Procedure: TRANSESOPHAGEAL ECHOCARDIOGRAM (TEE);  Surgeon: Josue Hector, MD;  Location: [place];  Service: Cardiovascular;  Laterality: N/A;  . Watchman procedure  02/2016        Home Medications    Prior to Admission medications   Medication Sig Start Date End Date Taking? Authorizing Provider  acetaminophen (TYLENOL) 500 MG tablet Take 1,000 mg by mouth every 8 (eight) hours as needed (pain).    [provider]  aspirin EC 325 MG tablet Take 325 mg by mouth daily.    [provider]  cetirizine (ZYRTEC) 10 MG tablet Take 10 mg by mouth at bedtime.     [provider]  diltiazem (CARDIZEM CD) 240 MG 24 hr capsule Take 240 mg by mouth at bedtime.     [provider]  fluticasone (FLONASE) 50 MCG/ACT nasal spray Place 1 spray into both nostrils 2 (two) times daily.  02/23/16   [provider]  meclizine (ANTIVERT) 25 MG tablet Take 25 mg by mouth 2 (two) times daily. For dizziness    [provider]  metoprolol succinate (TOPROL-XL) 25 MG 24 hr tablet Take 0.5 tablets (12.5 mg total) by mouth daily. 03/25/16   Allred, Jeneen Rinks, MD  Multiple Vitamin (MULTIVITAMIN) tablet Take 1 tablet by mouth daily.    [provider]  pantoprazole (PROTONIX) 40 MG tablet TAKE 1 TABLET BY MOUTH DAILY 30 MINUTES PRIOR TO BREAKFAST. 07/11/17   Annitta Needs, NP  pravastatin (PRAVACHOL) 20 MG tablet TAKE ONE TABLET BY MOUTH ONCE EVERY EVENING. 04/03/13   Lendon Colonel, NP  traZODone (DESYREL) 50 MG tablet Take 50 mg by mouth at bedtime.    [provider]  triamterene-hydrochlorothiazide (MAXZIDE-25) 37.5-25 MG per tablet Take 1 tablet by mouth daily. 09/20/11   Yehuda Savannah, MD  Vitamin D, Cholecalciferol, 1000 units TABS Take 1,000 Units by mouth at bedtime.    [provider]    Family History Family History  Problem Relation Age of Onset  . Hypertension Mother   . Hyperlipidemia Mother   . Renal cancer Mother   . Atrial fibrillation Sister   . Stroke Father   . Colon cancer Cousin   . Colon polyps Neg Hx     Social History Social History   Tobacco Use  . Smoking status: Former Smoker    Packs/day: 1.00    Years: 40.00    Pack years: 40.00    Last attempt to quit: 01/20/2008    Years since quitting: 10.1  . Smokeless tobacco: Never Used  Substance Use  Topics  . Alcohol use: Not Currently    Alcohol/week: 1.0 standard drinks    Types: 1 Standard drinks or equivalent per week    Frequency: Never    Comment: rare wine  . Drug use: No  lives at home Lives with spouse   Allergies   Statins and Xarelto [rivaroxaban]   Review of Systems Review of Systems  All other systems reviewed and are negative.    Physical Exam Updated Vital Signs BP (!) 162/108 (BP Location: Right Arm)   Pulse 65   Temp 97.7 F (36.5 C) (Oral)   Resp 19   Ht 5\' 9"  (1.753 m)   Wt 93.4 kg   SpO2 98%   BMI 30.42 kg/m   Physical Exam  Constitutional: He is oriented to person, place, and time. He appears well-developed and well-nourished.  Non-toxic appearance. He does not appear ill. No distress.  HENT:  Head: Normocephalic and atraumatic.  Right Ear: External ear normal.  Left Ear: External ear normal.  Nose: Nose normal. No mucosal edema or rhinorrhea.  Mouth/Throat: Oropharynx is clear and moist and mucous membranes are normal. No dental abscesses or uvula swelling.  There is no obvious swelling to his tongue now, he is able to handle his secretions and does not appear to be short of breath  Eyes:  Pupils are equal, round, and reactive to light. Conjunctivae and EOM are normal.  Neck: Normal range of motion and full passive range of motion without pain. Neck supple.  Cardiovascular: Normal rate, regular rhythm and normal heart sounds. Exam reveals no gallop and no friction rub.  No murmur heard. Pulmonary/Chest: Effort normal and breath sounds normal. No respiratory distress. He has no wheezes. He has no rhonchi. He has no rales. He exhibits no tenderness and no crepitus.  Abdominal: Normal appearance.  Musculoskeletal: Normal range of motion. He exhibits no edema or tenderness.  Moves all extremities well.   Neurological: He is alert and oriented to person, place, and time. He has normal strength. No cranial nerve deficit.  Skin: Skin is warm, dry and intact. No rash noted. No erythema. No pallor.  Psychiatric: He has a normal mood and affect. His speech is normal and behavior is normal. His mood appears not anxious.  Nursing note and vitals reviewed.    ED Treatments / Results  Labs (all labs ordered are listed, but only abnormal results are displayed) Labs Reviewed - No data to display  EKG None  Radiology No results found.  Procedures Procedures (including critical care time)  Medications Ordered in ED Medications - No data to display   Initial Impression / Assessment and Plan / ED Course  I have reviewed the triage vital signs and the nursing notes.  Pertinent labs & imaging results that were available during my care of the patient were reviewed by me and considered in my medical decision making (see chart for details).   When I look at his pill bottle the pharmaceutical company is [employer] and it is a round pill that is scored with an 8 above the score and a 05 below the score.  When I use a pill identifier on drugs.com it verifies the label on his pill bottle that this is a Museum/gallery conservator product for trazodone 50 mg tablets.  We discussed that he needs to go  to his pharmacy in the morning and have them put him back on the brand he had been on before.  If when he goes back on  his usual brand he continues to have problems he needs to be reassessed for something else causing his symptoms.  Currently he is improved and does not need any intervention.  Final Clinical Impressions(s) / ED Diagnoses   Final diagnoses:  Side effect of medication    ED Discharge Orders    None      Plan discharge  Rolland Porter, MD, Barbette Or, MD 03/29/18 [phone]

## 2018-03-29 NOTE — ED Triage Notes (Signed)
Per wife, pt has been taking trazodone for years but pt wife states since 10/12 after pt has taken his pm medications that hes been getting SOB and feeling like hes having difficulty swallowing and his throat is feeling swollen. No distress noted.

## 2018-03-29 NOTE — ED Notes (Signed)
Pt is feeling better now than before

## 2018-03-29 NOTE — ED Triage Notes (Signed)
PT states he was seen last night for possible allergic reaction to trazodone. PT states he felt like his throat was being to swell again around 1000.

## 2018-03-29 NOTE — Discharge Instructions (Addendum)
Please go to your pharmacy in the morning and get back on the brand of trazodone you were taking before and avoid taking this [employer] brand of Trazodone.  Return to the ED if you continue to have swelling or trouble swallowing despite going back on your usual brand of the Trazodone, because then something else is causing the problem.

## 2018-03-30 DIAGNOSIS — F419 Anxiety disorder, unspecified: Secondary | ICD-10-CM | POA: Diagnosis not present

## 2018-03-30 DIAGNOSIS — Z888 Allergy status to other drugs, medicaments and biological substances status: Secondary | ICD-10-CM | POA: Diagnosis not present

## 2018-03-30 DIAGNOSIS — G4700 Insomnia, unspecified: Secondary | ICD-10-CM | POA: Diagnosis not present

## 2018-04-20 DIAGNOSIS — R0602 Shortness of breath: Secondary | ICD-10-CM | POA: Diagnosis not present

## 2018-04-20 DIAGNOSIS — F419 Anxiety disorder, unspecified: Secondary | ICD-10-CM | POA: Diagnosis not present

## 2018-04-20 DIAGNOSIS — R451 Restlessness and agitation: Secondary | ICD-10-CM | POA: Diagnosis not present

## 2018-04-20 DIAGNOSIS — G4700 Insomnia, unspecified: Secondary | ICD-10-CM | POA: Diagnosis not present

## 2018-04-21 ENCOUNTER — Other Ambulatory Visit (HOSPITAL_COMMUNITY): Payer: Self-pay | Admitting: Respiratory Therapy

## 2018-04-21 DIAGNOSIS — R0602 Shortness of breath: Secondary | ICD-10-CM

## 2018-05-03 ENCOUNTER — Ambulatory Visit (HOSPITAL_COMMUNITY)
Admission: RE | Admit: 2018-05-03 | Discharge: 2018-05-03 | Disposition: A | Discharge status: Home / Self Care | Payer: PPO | Source: Ambulatory Visit | Attending: Internal Medicine | Admitting: Internal Medicine

## 2018-05-03 DIAGNOSIS — R0602 Shortness of breath: Secondary | ICD-10-CM

## 2018-05-03 LAB — PULMONARY FUNCTION TEST
DL/VA % pred: 93 %
DL/VA: 4.23 ml/min/mmHg/L
DLCO unc % pred: 67 %
DLCO unc: 21.01 ml/min/mmHg
FEF 25-75 Post: 2.8 L/sec
FEF 25-75 Pre: 2.34 L/sec
FEF2575-%Change-Post: 19 %
FEF2575-%Pred-Post: 131 %
FEF2575-%Pred-Pre: 109 %
FEV1-%Change-Post: 4 %
FEV1-%Pred-Post: 85 %
FEV1-%Pred-Pre: 81 %
FEV1-Post: 2.52 L
FEV1-Pre: 2.4 L
FEV1FVC-%Change-Post: 2 %
FEV1FVC-%Pred-Pre: 109 %
FEV6-%Change-Post: 2 %
FEV6-%Pred-Post: 80 %
FEV6-%Pred-Pre: 79 %
FEV6-Post: 3.08 L
FEV6-Pre: 3.02 L
FEV6FVC-%Change-Post: 0 %
FEV6FVC-%Pred-Post: 105 %
FEV6FVC-%Pred-Pre: 106 %
FVC-%Change-Post: 2 %
FVC-%Pred-Post: 76 %
FVC-%Pred-Pre: 74 %
FVC-Post: 3.11 L
FVC-Pre: 3.02 L
Post FEV1/FVC ratio: 81 %
Post FEV6/FVC ratio: 99 %
Pre FEV1/FVC ratio: 79 %
Pre FEV6/FVC Ratio: 100 %
RV % pred: 145 %
RV: 3.63 L
TLC % pred: 97 %
TLC: 6.69 L

## 2018-05-03 MED ORDER — ALBUTEROL SULFATE (2.5 MG/3ML) 0.083% IN NEBU
2.5000 mg | INHALATION_SOLUTION | Freq: Once | RESPIRATORY_TRACT | Status: AC
  Administered 2018-05-03: 2.5 mg via RESPIRATORY_TRACT

## 2018-06-20 ENCOUNTER — Other Ambulatory Visit: Payer: Self-pay

## 2018-06-20 ENCOUNTER — Other Ambulatory Visit: Payer: Self-pay | Admitting: *Deleted

## 2018-06-20 ENCOUNTER — Ambulatory Visit (HOSPITAL_COMMUNITY)
Admission: RE | Admit: 2018-06-20 | Discharge: 2018-06-20 | Disposition: A | Discharge status: Home / Self Care | Payer: PPO | Source: Ambulatory Visit | Attending: Family | Admitting: Family

## 2018-06-20 ENCOUNTER — Encounter: Payer: Self-pay | Admitting: Physician Assistant

## 2018-06-20 ENCOUNTER — Ambulatory Visit: Payer: PPO | Admitting: Physician Assistant

## 2018-06-20 VITALS — BP 152/93 | HR 52 | Temp 97.8°F | Resp 18 | Ht 69.0 in | Wt 206.0 lb

## 2018-06-20 DIAGNOSIS — I723 Aneurysm of iliac artery: Secondary | ICD-10-CM | POA: Diagnosis not present

## 2018-06-20 DIAGNOSIS — I714 Abdominal aortic aneurysm, without rupture, unspecified: Secondary | ICD-10-CM

## 2018-06-20 DIAGNOSIS — I713 Abdominal aortic aneurysm, ruptured, unspecified: Secondary | ICD-10-CM

## 2018-06-20 NOTE — Progress Notes (Signed)
HISTORY AND PHYSICAL     CC:  Follow up Requesting Provider:  Celene Squibb, MD  HPI: This is a 78 y.o. male who is followed for AAA.  It last measured 4.6cm in March 2019.  At his last visit, he did have some mild claudication sx with walking in both legs but was not limiting him at that time.  He did have a popliteal artery duplex in July 2018 but there was no evidence of popliteal artery aneurysms.  The pt's AAA aneurysm measured 4.6cm in March and returns today for follow up.    In 2009, pt did have some bleeding from  left renal artery and had angiogram with embolization.  He had been on coumadin for afib, but this was discontinued.  He states that he had a watchman procedure in 2017.  He has since been off coumadin and takes an adult asa daily.    The pt denies any chest pain/pressure or shortness of breath.  He sees Dr. [name] yearly.  He states he has some abdominal pain occasionally and points to the epigastric region.  He states that this is sharp sometimes but only briefly and most of the time it is dull.  His wife attributes this to heartburn.  He states he has occasional pain in the right and left lower quadrants occasionally as well when he over does activity.    The pt is not on a statin for cholesterol management.  He stopped taking this as he developed a choking feeling-he states this improved eventually once he stopped taking it. The pt has a remote hx of tobacco use.  The pt is on Beta blocker, CCB for blood pressure management.  He is not diabetic. He takes a daily asa   Past Medical History:  Diagnosis Date  . AAA (abdominal aortic aneurysm) ([place])   . Aneurysm ([place])   . Arthritis   . Degenerative joint disease   . History of blood transfusion 12/2007   "bleeding for Warfarin"  . Hyperlipidemia   . Hypertension    Admitted for chest pain and 06/2011-neg stress Myoview,mod. LVH with nl EF on echo; TSH of 3.75  . Meniere's disease   . Nephrolithiasis   .  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 Permanent atrial fibrillation    a. s/p Watchman  . Renal hemorrhage, right 2009   2009 while anticoagulated with Coumadin; treated with the renal artery embolization    Past Surgical History:  Procedure Laterality Date  . BIOPSY  02/21/2017   Procedure: BIOPSY;  Surgeon: Danie Binder, MD;  [facility];  Service: Endoscopy;;  gastric  . COLONOSCOPY N/A 02/21/2017   multiple polyps, one requiring piecemeal removal with epi and clip placement, multiple small and large mouthed diverticula, recto-sigmoid and sigmoid with significant excess looping, internal hemorrhoids. Path with one advanced adenoma, 3 simple adenomas, 2 hyperplastic polyps. Survelilance 3 years   . EMBOLIZATION  12/2007   Left renal hematoma, status post angiogram and embolization/notes 09/29/2010  . ESOPHAGOGASTRODUODENOSCOPY N/A 02/21/2017   benign-appearing esophageal stenosis s/p dilation, gastritis, scarred mucosa in pylorus due to prior PUD. reactive gastropathy on path  . FRACTURE SURGERY    . LEFT ATRIAL APPENDAGE OCCLUSION N/A 03/18/2016   Procedure: LEFT ATRIAL APPENDAGE OCCLUSION;  Surgeon: [name], MD;  Location: [facility];  Service: Cardiovascular;  Laterality: N/A;  . ORIF FEMUR FRACTURE Right 1960   "2  steel rods in leg"  . PILONIDAL CYST EXCISION    . POLYPECTOMY  02/21/2017   Procedure: POLYPECTOMY;  Surgeon: Danie Binder, MD;  [facility];  Service: Endoscopy;;  colon  . SAVORY DILATION N/A 02/21/2017   Procedure: SAVORY DILATION;  Surgeon: Danie Binder, MD;  [facility];  Service: Endoscopy;  Laterality: N/A;  . TEE WITHOUT CARDIOVERSION N/A 03/10/2016   Procedure: TRANSESOPHAGEAL ECHOCARDIOGRAM (TEE);  Surgeon: Josue Hector, MD;  Location: [place];  Service: Cardiovascular;  Laterality: N/A;  . TEE WITHOUT CARDIOVERSION N/A 04/30/2016   Procedure:  TRANSESOPHAGEAL ECHOCARDIOGRAM (TEE);  Surgeon: Josue Hector, MD;  Location: [facility] [place] [facility];  Service: Cardiovascular;  Laterality: N/A;  . Watchman procedure  02/2016    Allergies  Allergen Reactions  . Statins Other (See Comments)    Muscle aches Muscle aches  . Rivaroxaban Other (See Comments)    Developed redness in both legs Developed redness in both legs    Current Outpatient Medications  Medication Sig Dispense Refill  . acetaminophen (TYLENOL) 500 MG tablet Take 1,000 mg by mouth every 8 (eight) hours as needed (pain).    Marland Kitchen aspirin EC 325 MG tablet Take 325 mg by mouth daily.    . cetirizine (ZYRTEC) 10 MG tablet Take 10 mg by mouth at bedtime.     Marland Kitchen diltiazem (CARDIZEM CD) 240 MG 24 hr capsule Take 240 mg by mouth at bedtime.     . fluticasone (FLONASE) 50 MCG/ACT nasal spray Place 1 spray into both nostrils 2 (two) times daily.     . meclizine (ANTIVERT) 25 MG tablet Take 25 mg by mouth 2 (two) times daily. For dizziness    . metoprolol succinate (TOPROL-XL) 25 MG 24 hr tablet Take 0.5 tablets (12.5 mg total) by mouth daily.    . Multiple Vitamin (MULTIVITAMIN) tablet Take 1 tablet by mouth daily.    . pantoprazole (PROTONIX) 40 MG tablet TAKE 1 TABLET BY MOUTH DAILY 30 MINUTES PRIOR TO BREAKFAST. 30 tablet 3  . pravastatin (PRAVACHOL) 20 MG tablet TAKE ONE TABLET BY MOUTH ONCE EVERY EVENING. 90 tablet 3  . predniSONE (DELTASONE) 20 MG tablet Take 2 tablets (40 mg total) by mouth daily. 10 tablet 0  . traZODone (DESYREL) 50 MG tablet Take 50 mg by mouth at bedtime.    . triamterene-hydrochlorothiazide (MAXZIDE-25) 37.5-25 MG per tablet Take 1 tablet by mouth daily.     No current facility-administered medications for this visit.     Family History  Problem Relation Age of Onset  . Hypertension Mother   . Hyperlipidemia Mother   . Renal cancer Mother   . Atrial fibrillation Sister   . Stroke Father   . Colon cancer Cousin   . Colon polyps Neg Hx     Social History    Socioeconomic History  . Marital status: Married    Spouse name: Not on file  . Number of children: 2  . Years of education: Not on file  . Highest education level: Not on file  Occupational History  . Occupation: Retired    Comment: Chief Technology Officer job  Social Needs  . Financial resource strain: Not on file  . Food insecurity:    Worry: Not on file    Inability: Not on file  . Transportation needs:    Medical: Not on file    Non-medical: Not on file  Tobacco Use  . Smoking status: Former Smoker    Packs/day: 1.00  Years: 40.00    Pack years: 40.00    Last attempt to quit: 01/20/2008    Years since quitting: 10.4  . Smokeless tobacco: Never Used  Substance and Sexual Activity  . Alcohol use: Not Currently    Alcohol/week: 1.0 standard drinks    Types: 1 Standard drinks or equivalent per week    Frequency: Never    Comment: rare wine  . Drug use: No  . Sexual activity: Yes  Lifestyle  . Physical activity:    Days per week: Not on file    Minutes per session: Not on file  . Stress: Not on file  Relationships  . Social connections:    Talks on phone: Not on file    Gets together: Not on file    Attends religious service: Not on file    Active member of club or organization: Not on file    Attends meetings of clubs or organizations: Not on file    Relationship status: Not on file  . Intimate partner violence:    Fear of current or ex partner: Not on file    Emotionally abused: Not on file    Physically abused: Not on file    Forced sexual activity: Not on file  Other Topics Concern  . Not on file  Social History Narrative   Retired from Architect   Lives in [place]:   [X]  denotes positive finding, [ ]  denotes negative finding Cardiac  Comments:  Chest pain or chest pressure:    Shortness of breath upon exertion:    High blood pressure x   Irregular heart rhythm: x See HPI      Vascular    Pain in calf,  thigh, or hip brought on by ambulation:    Pain in feet at night that wakes you up from your sleep:     Blood clot in your veins:    Leg swelling:         Pulmonary    Oxygen at home:    Productive cough:     Wheezing:         Neurologic    Sudden weakness in arms or legs:     Sudden numbness in arms or legs:     Sudden onset of difficulty speaking or slurred speech:    Temporary loss of vision in one eye:     Problems with dizziness:         Gastrointestinal    GERD x   Hx colon polyps x   Genitourinary    Burning when urinating:     Blood in urine:        Psychiatric    Major depression:         Hematologic    Bleeding problems:    Problems with blood clotting too easily:        Skin    Rashes or ulcers:        Constitutional    Fever or chills:      PHYSICAL EXAMINATION:  Today's Vitals   06/20/18 0928  BP: (!) 152/93  Pulse: (!) 52  Resp: 18  Temp: 97.8 F (36.6 C)  TempSrc: Oral  SpO2: 97%  Weight: 206 lb (93.4 kg)  Height: 5\' 9"  (1.753 m)   Body mass index is 30.42 kg/m.   General:  WDWN in NAD; vital signs documented above Gait: Not observed HENT: WNL, normocephalic Pulmonary: normal non-labored breathing , without Rales,  rhonchi,  wheezing Cardiac: regular HR, without  Murmurs without carotid bruits  Abdomen: soft, NT, no masses Skin: without rashes Vascular Exam/Pulses:  Right Left  Radial 2+ (normal) 2+ (normal)  Ulnar Unable to palpate  Unable to palpate   Femoral 2+ (normal) 2+ (normal)  Popliteal Unable to palpate  Unable to palpate;   DP 2+ (normal) 2+ (normal)  PT 2+ (normal) 2+ (normal)   Extremities: without ischemic changes, without Gangrene , without cellulitis; without open wounds;  Musculoskeletal: no muscle wasting or atrophy  Neurologic: A&O X 3;  No focal weakness or paresthesias are detected Psychiatric:  The pt has Normal affect.   Non-Invasive Vascular Imaging:   AAA duplex 06/20/2018: Maximum diameter  5.1cm   AAA u/s 07/29/17: 4.6cm  BLE arterial duplex 12/28/16: No evidence of popliteal artery aneurysms  Pt meds includes: Statin:  No. Beta Blocker:  Yes.   Aspirin:  Yes.   ACEI:  No. ARB:  No. CCB use:  Yes Other Antiplatelet/Anticoagulant:  No   ASSESSMENT/PLAN:: 78 y.o. male who is followed for AAA.  It last measured 4.6cm in March 2019 and is here today for follow up.   -pt doing well today.  He did have an increase in size of his AAA from 4.6cm to 5.1cm.  He is asymptomatic from this.  I did discuss with him that we will get a CTA of the abdomen and pelvis and he will see Dr. Carlis Abbott back next week.  Most likely would not repair until it is at least 5.5cm as I discussed this with Dr. Carlis Abbott.  CTA will give a more definite measurement and also define anatomy should he need repair (EVAR vs open repair).  I did discuss stent grafting vs open repair with pt and his wife.  He has had a BLE arterial duplex in 2018 that revealed no evidence of popliteal aneurysms bilaterally.   -he does have a hx of afib and had a Watchman procedure in 2017 by Dr. Rayann Heman.  He does not take coumadin and takes a daily asa 325mg .   -discussed s/s of ruptured AAA-he lives in [facility] and discussed with him that EMS should bring them straight to [facility] ER should he develop sudden sharp back or abdominal pain.   Leontine Locket, PA-C [facility] [phone]  Clinic MD: Carlis Abbott

## 2018-06-26 ENCOUNTER — Ambulatory Visit
Admission: RE | Admit: 2018-06-26 | Discharge: 2018-06-26 | Disposition: A | Discharge status: Home / Self Care | Payer: PPO | Source: Ambulatory Visit | Attending: Vascular Surgery | Admitting: Vascular Surgery

## 2018-06-26 DIAGNOSIS — I713 Abdominal aortic aneurysm, ruptured, unspecified: Secondary | ICD-10-CM

## 2018-06-26 DIAGNOSIS — I714 Abdominal aortic aneurysm, without rupture: Secondary | ICD-10-CM | POA: Diagnosis not present

## 2018-06-26 MED ORDER — IOPAMIDOL (ISOVUE-370) INJECTION 76%
75.0000 mL | Freq: Once | INTRAVENOUS | Status: AC | PRN
  Administered 2018-06-26: 75 mL via INTRAVENOUS

## 2018-06-27 ENCOUNTER — Ambulatory Visit (INDEPENDENT_AMBULATORY_CARE_PROVIDER_SITE_OTHER): Payer: PPO | Admitting: Vascular Surgery

## 2018-06-27 ENCOUNTER — Encounter: Payer: Self-pay | Admitting: Vascular Surgery

## 2018-06-27 ENCOUNTER — Other Ambulatory Visit: Payer: Self-pay

## 2018-06-27 VITALS — BP 139/87 | HR 63 | Resp 18 | Ht 69.0 in | Wt 206.0 lb

## 2018-06-27 DIAGNOSIS — I714 Abdominal aortic aneurysm, without rupture, unspecified: Secondary | ICD-10-CM

## 2018-06-27 NOTE — Progress Notes (Signed)
Patient name: [name] MRN: [id number] DOB: March 26, 1941 Sex: male  REASON FOR VISIT: 5.1 cm AAA  HPI: [name] is a 78 y.o. male with history of atrial fibrillation status post watchman now off anticoagulation, hypertension, hyperlipidemia that presents for follow-up after CT abdomen pelvis to evaluate his abdominal aortic aneurysm.  Patient states he has known about his aneurysm for at least 10 years since 2009.  This has been followed with yearly surveillance.  The aneurysm last measured 4.6 cm on 07/29/2017 and then approximately 1 year later it measured 5.1 cm.  He reports no new abdominal or back pain.  He underwent CT scan yesterday and presents for follow-up.  He denies any previous abdominal surgery.  He denies tobacco abuse.  States he can walk about a quarter of a mile before he gives out.  Past Medical History:  Diagnosis Date  . AAA (abdominal aortic aneurysm) ([place])   . Aneurysm ([place])   . Arthritis   . Degenerative joint disease   . History of blood transfusion 12/2007   "bleeding for Warfarin"  . Hyperlipidemia   . Hypertension    Admitted for chest pain and 06/2011-neg stress Myoview,mod. LVH with nl EF on echo; TSH of 3.75  . Meniere's disease   . Nephrolithiasis   . 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 Permanent atrial fibrillation    a. s/p Watchman  . Renal hemorrhage, right 2009   2009 while anticoagulated with Coumadin; treated with the renal artery embolization    Past Surgical History:  Procedure Laterality Date  . BIOPSY  02/21/2017   Procedure: BIOPSY;  Surgeon: Danie Binder, MD;  [facility];  Service: Endoscopy;;  gastric  . COLONOSCOPY N/A 02/21/2017   multiple polyps, one requiring piecemeal removal with epi and clip placement, multiple small and large mouthed diverticula, recto-sigmoid and sigmoid with significant excess looping, internal hemorrhoids. Path with one advanced  adenoma, 3 simple adenomas, 2 hyperplastic polyps. Survelilance 3 years   . EMBOLIZATION  12/2007   Left renal hematoma, status post angiogram and embolization/notes 09/29/2010  . ESOPHAGOGASTRODUODENOSCOPY N/A 02/21/2017   benign-appearing esophageal stenosis s/p dilation, gastritis, scarred mucosa in pylorus due to prior PUD. reactive gastropathy on path  . FRACTURE SURGERY    . LEFT ATRIAL APPENDAGE OCCLUSION N/A 03/18/2016   Procedure: LEFT ATRIAL APPENDAGE OCCLUSION;  Surgeon: [name], MD;  Location: [facility];  Service: Cardiovascular;  Laterality: N/A;  . ORIF FEMUR FRACTURE Right 1960   "2 steel rods in leg"  . PILONIDAL CYST EXCISION    . POLYPECTOMY  02/21/2017   Procedure: POLYPECTOMY;  Surgeon: Danie Binder, MD;  [facility];  Service: Endoscopy;;  colon  . SAVORY DILATION N/A 02/21/2017   Procedure: SAVORY DILATION;  Surgeon: Danie Binder, MD;  [facility];  Service: Endoscopy;  Laterality: N/A;  . TEE WITHOUT CARDIOVERSION N/A 03/10/2016   Procedure: TRANSESOPHAGEAL ECHOCARDIOGRAM (TEE);  Surgeon: Josue Hector, MD;  Location: [facility];  Service: Cardiovascular;  Laterality: N/A;  . TEE WITHOUT CARDIOVERSION N/A 04/30/2016   Procedure: TRANSESOPHAGEAL ECHOCARDIOGRAM (TEE);  Surgeon: Josue Hector, MD;  Location: [facility];  Service: Cardiovascular;  Laterality: N/A;  . Watchman procedure  02/2016    Family History  Problem Relation Age of Onset  . Hypertension Mother   . Hyperlipidemia Mother   . Renal cancer Mother   .  Atrial fibrillation Sister   . Stroke Father   . Colon cancer Cousin   . Colon polyps Neg Hx     SOCIAL HISTORY: Social History   Tobacco Use  . Smoking status: Former Smoker    Packs/day: 1.00    Years: 40.00    Pack years: 40.00    Last attempt to quit: 01/20/2008    Years since quitting: 10.4  . Smokeless tobacco: Never Used  Substance Use Topics  . Alcohol use: Not Currently    Alcohol/week:  1.0 standard drinks    Types: 1 Standard drinks or equivalent per week    Frequency: Never    Comment: rare wine    Allergies  Allergen Reactions  . Statins Other (See Comments)    Muscle aches Muscle aches  . Rivaroxaban Other (See Comments)    Developed redness in both legs Developed redness in both legs    Current Outpatient Medications  Medication Sig Dispense Refill  . acetaminophen (TYLENOL) 500 MG tablet Take 1,000 mg by mouth every 8 (eight) hours as needed (pain).    Marland Kitchen aspirin EC 325 MG tablet Take 325 mg by mouth daily.    . cetirizine (ZYRTEC) 10 MG tablet Take 10 mg by mouth at bedtime.     Marland Kitchen diltiazem (CARDIZEM CD) 240 MG 24 hr capsule Take 240 mg by mouth at bedtime.     . fluticasone (FLONASE) 50 MCG/ACT nasal spray Place 1 spray into both nostrils 2 (two) times daily.     Marland Kitchen LORazepam (ATIVAN) 0.5 MG tablet Take 0.5 mg by mouth every 8 (eight) hours.    . meclizine (ANTIVERT) 25 MG tablet Take 25 mg by mouth 2 (two) times daily. For dizziness    . metoprolol succinate (TOPROL-XL) 25 MG 24 hr tablet Take 0.5 tablets (12.5 mg total) by mouth daily.    . Multiple Vitamin (MULTIVITAMIN) tablet Take 1 tablet by mouth daily.    . pantoprazole (PROTONIX) 40 MG tablet TAKE 1 TABLET BY MOUTH DAILY 30 MINUTES PRIOR TO BREAKFAST. 30 tablet 3  . pravastatin (PRAVACHOL) 20 MG tablet TAKE ONE TABLET BY MOUTH ONCE EVERY EVENING. 90 tablet 3  . predniSONE (DELTASONE) 20 MG tablet Take 2 tablets (40 mg total) by mouth daily. 10 tablet 0  . traZODone (DESYREL) 50 MG tablet Take 50 mg by mouth at bedtime.    . triamterene-hydrochlorothiazide (MAXZIDE-25) 37.5-25 MG per tablet Take 1 tablet by mouth daily.     No current facility-administered medications for this visit.     REVIEW OF SYSTEMS:  [X]  denotes positive finding, [ ]  denotes negative finding Cardiac  Comments:  Chest pain or chest pressure:    Shortness of breath upon exertion:    Short of breath when lying flat:      Irregular heart rhythm:        Vascular    Pain in calf, thigh, or hip brought on by ambulation:    Pain in feet at night that wakes you up from your sleep:     Blood clot in your veins:    Leg swelling:         Pulmonary    Oxygen at home:    Productive cough:     Wheezing:         Neurologic    Sudden weakness in arms or legs:     Sudden numbness in arms or legs:     Sudden onset of difficulty speaking or slurred speech:  Temporary loss of vision in one eye:     Problems with dizziness:         Gastrointestinal    Blood in stool:     Vomited blood:         Genitourinary    Burning when urinating:     Blood in urine:        Psychiatric    Major depression:         Hematologic    Bleeding problems:    Problems with blood clotting too easily:        Skin    Rashes or ulcers:        Constitutional    Fever or chills:      PHYSICAL EXAM: Vitals:   06/27/18 0843  BP: 139/87  Pulse: 63  Resp: 18  SpO2: 96%  Weight: 206 lb (93.4 kg)  Height: 5\' 9"  (1.753 m)    GENERAL: The patient is a well-nourished male, in no acute distress. The vital signs are documented above. CARDIAC: There is a regular rate and rhythm.  VASCULAR:  2+ femoral pulse palpable bilateral groins 2+ PT palpable bilateral lower extremities PULMONARY: There is good air exchange bilaterally without wheezing or rales. ABDOMEN: Soft and non-tender with normal pitched bowel sounds. No pain with palpation of aneurysm. MUSCULOSKELETAL: There are no major deformities or cyanosis. NEUROLOGIC: No focal weakness or paresthesias are detected. SKIN: There are no ulcers or rashes noted. PSYCHIATRIC: The patient has a normal affect.  DATA:   CT abdomen pelvis 06/26/18: On my review there is an approximate 5.1 cm infrarenal abdominal aortic aneurysm.  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  There is an incidental finding of 1.5 cm right kidney mass.  Assessment/Plan:  Had a long discussion with Mr. Snelgrove and his wife regarding his CT scan.  Ultimately his aneurysm has grown from 4.6 cm to 5.1 cm over the last year.  He remains asymptomatic.  After reviewing his CT scan he does not have a straightforward endovascular option given 35 mm neck with significant circumferential mural thrombus all the way up to the renal arteries.  I think he would require an open abdominal aortic aneurysm repair.  We discussed that current guidelines by the SVS are for repair greater than 5.5 cm unless there is rapid growth in the aneurysm.  We discussed his overall anxiety level regarding the nature of his aneurysm and he states he is not terribly worried about it.  Given these findings we came to the agreement that we would plan a closer interval surveillance and plan to see him back in 6 months with a repeat abdominal aortic duplex.  He would like to continue surveillance for now given this will require open repair with likely suprarenal clamp.  I will also refer him to urology to evaluate this right kidney mass noted as an incidental finding on a CT scan which he states is new to him.  Look forward to seeing him back in 6 months.   [name], MD [facility] Office: [phone] Pager: [place]

## 2018-06-28 DIAGNOSIS — D4101 Neoplasm of uncertain behavior of right kidney: Secondary | ICD-10-CM | POA: Diagnosis not present

## 2018-06-28 DIAGNOSIS — I714 Abdominal aortic aneurysm, without rupture: Secondary | ICD-10-CM | POA: Diagnosis not present

## 2018-07-14 ENCOUNTER — Ambulatory Visit: Payer: PPO | Admitting: Gastroenterology

## 2018-07-15 DIAGNOSIS — Z2821 Immunization not carried out because of patient refusal: Secondary | ICD-10-CM | POA: Diagnosis not present

## 2018-07-15 DIAGNOSIS — J22 Unspecified acute lower respiratory infection: Secondary | ICD-10-CM | POA: Diagnosis not present

## 2018-07-15 DIAGNOSIS — R05 Cough: Secondary | ICD-10-CM | POA: Diagnosis not present

## 2018-07-15 DIAGNOSIS — R062 Wheezing: Secondary | ICD-10-CM | POA: Diagnosis not present

## 2018-07-21 DIAGNOSIS — R062 Wheezing: Secondary | ICD-10-CM | POA: Diagnosis not present

## 2018-07-21 DIAGNOSIS — J060 Acute laryngopharyngitis: Secondary | ICD-10-CM | POA: Diagnosis not present

## 2018-07-21 DIAGNOSIS — R05 Cough: Secondary | ICD-10-CM | POA: Diagnosis not present

## 2018-07-24 ENCOUNTER — Other Ambulatory Visit (HOSPITAL_COMMUNITY): Payer: Self-pay | Admitting: Internal Medicine

## 2018-07-24 ENCOUNTER — Ambulatory Visit (HOSPITAL_COMMUNITY)
Admission: RE | Admit: 2018-07-24 | Discharge: 2018-07-24 | Disposition: A | Discharge status: Home / Self Care | Payer: PPO | Source: Ambulatory Visit | Attending: Internal Medicine | Admitting: Internal Medicine

## 2018-07-24 DIAGNOSIS — R05 Cough: Secondary | ICD-10-CM | POA: Diagnosis not present

## 2018-07-24 DIAGNOSIS — K219 Gastro-esophageal reflux disease without esophagitis: Secondary | ICD-10-CM | POA: Diagnosis not present

## 2018-07-24 DIAGNOSIS — R059 Cough, unspecified: Secondary | ICD-10-CM

## 2018-07-24 DIAGNOSIS — R0602 Shortness of breath: Secondary | ICD-10-CM

## 2018-08-18 DIAGNOSIS — R7301 Impaired fasting glucose: Secondary | ICD-10-CM | POA: Diagnosis not present

## 2018-08-18 DIAGNOSIS — R7303 Prediabetes: Secondary | ICD-10-CM | POA: Diagnosis not present

## 2018-08-18 DIAGNOSIS — E782 Mixed hyperlipidemia: Secondary | ICD-10-CM | POA: Diagnosis not present

## 2018-08-18 DIAGNOSIS — I10 Essential (primary) hypertension: Secondary | ICD-10-CM | POA: Diagnosis not present

## 2018-08-23 DIAGNOSIS — I482 Chronic atrial fibrillation, unspecified: Secondary | ICD-10-CM | POA: Diagnosis not present

## 2018-08-23 DIAGNOSIS — I10 Essential (primary) hypertension: Secondary | ICD-10-CM | POA: Diagnosis not present

## 2018-08-23 DIAGNOSIS — F5101 Primary insomnia: Secondary | ICD-10-CM | POA: Diagnosis not present

## 2018-08-23 DIAGNOSIS — I719 Aortic aneurysm of unspecified site, without rupture: Secondary | ICD-10-CM | POA: Diagnosis not present

## 2018-08-23 DIAGNOSIS — D4111 Neoplasm of uncertain behavior of right renal pelvis: Secondary | ICD-10-CM | POA: Diagnosis not present

## 2018-08-23 DIAGNOSIS — R944 Abnormal results of kidney function studies: Secondary | ICD-10-CM | POA: Diagnosis not present

## 2018-08-23 DIAGNOSIS — K219 Gastro-esophageal reflux disease without esophagitis: Secondary | ICD-10-CM | POA: Diagnosis not present

## 2018-08-23 DIAGNOSIS — R05 Cough: Secondary | ICD-10-CM | POA: Diagnosis not present

## 2018-08-23 DIAGNOSIS — E782 Mixed hyperlipidemia: Secondary | ICD-10-CM | POA: Diagnosis not present

## 2018-08-23 DIAGNOSIS — R7301 Impaired fasting glucose: Secondary | ICD-10-CM | POA: Diagnosis not present

## 2018-08-23 DIAGNOSIS — I712 Thoracic aortic aneurysm, without rupture: Secondary | ICD-10-CM | POA: Diagnosis not present

## 2018-08-23 DIAGNOSIS — R7303 Prediabetes: Secondary | ICD-10-CM | POA: Diagnosis not present

## 2018-08-29 DIAGNOSIS — I482 Chronic atrial fibrillation, unspecified: Secondary | ICD-10-CM | POA: Diagnosis not present

## 2018-08-29 DIAGNOSIS — K219 Gastro-esophageal reflux disease without esophagitis: Secondary | ICD-10-CM | POA: Diagnosis not present

## 2018-08-29 DIAGNOSIS — I10 Essential (primary) hypertension: Secondary | ICD-10-CM | POA: Diagnosis not present

## 2018-08-29 DIAGNOSIS — R7303 Prediabetes: Secondary | ICD-10-CM | POA: Diagnosis not present

## 2018-08-29 DIAGNOSIS — I719 Aortic aneurysm of unspecified site, without rupture: Secondary | ICD-10-CM | POA: Diagnosis not present

## 2018-08-29 DIAGNOSIS — E782 Mixed hyperlipidemia: Secondary | ICD-10-CM | POA: Diagnosis not present

## 2018-08-29 DIAGNOSIS — R7301 Impaired fasting glucose: Secondary | ICD-10-CM | POA: Diagnosis not present

## 2018-09-27 DIAGNOSIS — Z0000 Encounter for general adult medical examination without abnormal findings: Secondary | ICD-10-CM | POA: Diagnosis not present

## 2018-10-12 DIAGNOSIS — H5201 Hypermetropia, right eye: Secondary | ICD-10-CM | POA: Diagnosis not present

## 2018-10-12 DIAGNOSIS — H52221 Regular astigmatism, right eye: Secondary | ICD-10-CM | POA: Diagnosis not present

## 2018-11-08 DIAGNOSIS — E782 Mixed hyperlipidemia: Secondary | ICD-10-CM | POA: Diagnosis not present

## 2018-11-08 DIAGNOSIS — R7301 Impaired fasting glucose: Secondary | ICD-10-CM | POA: Diagnosis not present

## 2018-11-08 DIAGNOSIS — R7303 Prediabetes: Secondary | ICD-10-CM | POA: Diagnosis not present

## 2018-11-08 DIAGNOSIS — I482 Chronic atrial fibrillation, unspecified: Secondary | ICD-10-CM | POA: Diagnosis not present

## 2018-11-08 DIAGNOSIS — I719 Aortic aneurysm of unspecified site, without rupture: Secondary | ICD-10-CM | POA: Diagnosis not present

## 2018-11-08 DIAGNOSIS — I10 Essential (primary) hypertension: Secondary | ICD-10-CM | POA: Diagnosis not present

## 2018-11-08 DIAGNOSIS — K219 Gastro-esophageal reflux disease without esophagitis: Secondary | ICD-10-CM | POA: Diagnosis not present

## 2018-12-20 DIAGNOSIS — D4101 Neoplasm of uncertain behavior of right kidney: Secondary | ICD-10-CM | POA: Diagnosis not present

## 2018-12-27 DIAGNOSIS — D4101 Neoplasm of uncertain behavior of right kidney: Secondary | ICD-10-CM | POA: Diagnosis not present

## 2018-12-27 DIAGNOSIS — N2889 Other specified disorders of kidney and ureter: Secondary | ICD-10-CM | POA: Diagnosis not present

## 2019-01-01 DIAGNOSIS — D49511 Neoplasm of unspecified behavior of right kidney: Secondary | ICD-10-CM | POA: Diagnosis not present

## 2019-01-01 DIAGNOSIS — R350 Frequency of micturition: Secondary | ICD-10-CM | POA: Diagnosis not present

## 2019-01-01 DIAGNOSIS — N401 Enlarged prostate with lower urinary tract symptoms: Secondary | ICD-10-CM | POA: Diagnosis not present

## 2019-01-04 ENCOUNTER — Other Ambulatory Visit: Payer: Self-pay | Admitting: *Deleted

## 2019-01-11 DIAGNOSIS — R21 Rash and other nonspecific skin eruption: Secondary | ICD-10-CM | POA: Diagnosis not present

## 2019-01-11 DIAGNOSIS — R7303 Prediabetes: Secondary | ICD-10-CM | POA: Diagnosis not present

## 2019-01-11 DIAGNOSIS — R7301 Impaired fasting glucose: Secondary | ICD-10-CM | POA: Diagnosis not present

## 2019-01-11 DIAGNOSIS — I10 Essential (primary) hypertension: Secondary | ICD-10-CM | POA: Diagnosis not present

## 2019-01-11 DIAGNOSIS — I4821 Permanent atrial fibrillation: Secondary | ICD-10-CM | POA: Diagnosis not present

## 2019-01-11 DIAGNOSIS — D4111 Neoplasm of uncertain behavior of right renal pelvis: Secondary | ICD-10-CM | POA: Diagnosis not present

## 2019-01-11 DIAGNOSIS — E782 Mixed hyperlipidemia: Secondary | ICD-10-CM | POA: Diagnosis not present

## 2019-01-11 DIAGNOSIS — R51 Headache: Secondary | ICD-10-CM | POA: Diagnosis not present

## 2019-01-11 DIAGNOSIS — R944 Abnormal results of kidney function studies: Secondary | ICD-10-CM | POA: Diagnosis not present

## 2019-01-11 DIAGNOSIS — I712 Thoracic aortic aneurysm, without rupture: Secondary | ICD-10-CM | POA: Diagnosis not present

## 2019-01-12 DIAGNOSIS — I712 Thoracic aortic aneurysm, without rupture: Secondary | ICD-10-CM | POA: Diagnosis not present

## 2019-01-12 DIAGNOSIS — Q120 Congenital cataract: Secondary | ICD-10-CM | POA: Diagnosis not present

## 2019-01-12 DIAGNOSIS — R7301 Impaired fasting glucose: Secondary | ICD-10-CM | POA: Diagnosis not present

## 2019-01-12 DIAGNOSIS — K829 Disease of gallbladder, unspecified: Secondary | ICD-10-CM | POA: Diagnosis not present

## 2019-01-12 DIAGNOSIS — R944 Abnormal results of kidney function studies: Secondary | ICD-10-CM | POA: Diagnosis not present

## 2019-01-12 DIAGNOSIS — R7303 Prediabetes: Secondary | ICD-10-CM | POA: Diagnosis not present

## 2019-01-12 DIAGNOSIS — I482 Chronic atrial fibrillation, unspecified: Secondary | ICD-10-CM | POA: Diagnosis not present

## 2019-01-12 DIAGNOSIS — Z86010 Personal history of colonic polyps: Secondary | ICD-10-CM | POA: Diagnosis not present

## 2019-01-12 DIAGNOSIS — D181 Lymphangioma, any site: Secondary | ICD-10-CM | POA: Diagnosis not present

## 2019-01-12 DIAGNOSIS — E782 Mixed hyperlipidemia: Secondary | ICD-10-CM | POA: Diagnosis not present

## 2019-01-12 DIAGNOSIS — I719 Aortic aneurysm of unspecified site, without rupture: Secondary | ICD-10-CM | POA: Diagnosis not present

## 2019-01-12 DIAGNOSIS — I10 Essential (primary) hypertension: Secondary | ICD-10-CM | POA: Diagnosis not present

## 2019-01-23 DIAGNOSIS — S80811A Abrasion, right lower leg, initial encounter: Secondary | ICD-10-CM | POA: Diagnosis not present

## 2019-01-31 DIAGNOSIS — J302 Other seasonal allergic rhinitis: Secondary | ICD-10-CM | POA: Diagnosis not present

## 2019-01-31 DIAGNOSIS — H8103 Meniere's disease, bilateral: Secondary | ICD-10-CM | POA: Diagnosis not present

## 2019-01-31 DIAGNOSIS — G4489 Other headache syndrome: Secondary | ICD-10-CM | POA: Diagnosis not present

## 2019-02-12 DIAGNOSIS — R3915 Urgency of urination: Secondary | ICD-10-CM | POA: Diagnosis not present

## 2019-02-12 DIAGNOSIS — N401 Enlarged prostate with lower urinary tract symptoms: Secondary | ICD-10-CM | POA: Diagnosis not present

## 2019-02-12 DIAGNOSIS — R351 Nocturia: Secondary | ICD-10-CM | POA: Diagnosis not present

## 2019-02-20 DIAGNOSIS — R7301 Impaired fasting glucose: Secondary | ICD-10-CM | POA: Diagnosis not present

## 2019-02-20 DIAGNOSIS — I10 Essential (primary) hypertension: Secondary | ICD-10-CM | POA: Diagnosis not present

## 2019-02-20 DIAGNOSIS — I719 Aortic aneurysm of unspecified site, without rupture: Secondary | ICD-10-CM | POA: Diagnosis not present

## 2019-02-20 DIAGNOSIS — R7303 Prediabetes: Secondary | ICD-10-CM | POA: Diagnosis not present

## 2019-02-20 DIAGNOSIS — E782 Mixed hyperlipidemia: Secondary | ICD-10-CM | POA: Diagnosis not present

## 2019-02-20 DIAGNOSIS — K219 Gastro-esophageal reflux disease without esophagitis: Secondary | ICD-10-CM | POA: Diagnosis not present

## 2019-02-20 DIAGNOSIS — I482 Chronic atrial fibrillation, unspecified: Secondary | ICD-10-CM | POA: Diagnosis not present

## 2019-03-14 DIAGNOSIS — N401 Enlarged prostate with lower urinary tract symptoms: Secondary | ICD-10-CM | POA: Diagnosis not present

## 2019-03-14 DIAGNOSIS — R351 Nocturia: Secondary | ICD-10-CM | POA: Diagnosis not present

## 2019-03-14 DIAGNOSIS — R3915 Urgency of urination: Secondary | ICD-10-CM | POA: Diagnosis not present

## 2019-04-06 DIAGNOSIS — E782 Mixed hyperlipidemia: Secondary | ICD-10-CM | POA: Diagnosis not present

## 2019-04-06 DIAGNOSIS — I719 Aortic aneurysm of unspecified site, without rupture: Secondary | ICD-10-CM | POA: Diagnosis not present

## 2019-04-06 DIAGNOSIS — K219 Gastro-esophageal reflux disease without esophagitis: Secondary | ICD-10-CM | POA: Diagnosis not present

## 2019-04-06 DIAGNOSIS — R7303 Prediabetes: Secondary | ICD-10-CM | POA: Diagnosis not present

## 2019-04-06 DIAGNOSIS — I482 Chronic atrial fibrillation, unspecified: Secondary | ICD-10-CM | POA: Diagnosis not present

## 2019-04-06 DIAGNOSIS — I10 Essential (primary) hypertension: Secondary | ICD-10-CM | POA: Diagnosis not present

## 2019-04-06 DIAGNOSIS — R7301 Impaired fasting glucose: Secondary | ICD-10-CM | POA: Diagnosis not present

## 2019-04-20 ENCOUNTER — Other Ambulatory Visit: Payer: Self-pay

## 2019-05-01 ENCOUNTER — Ambulatory Visit (INDEPENDENT_AMBULATORY_CARE_PROVIDER_SITE_OTHER): Payer: PPO | Admitting: Cardiovascular Disease

## 2019-05-01 ENCOUNTER — Other Ambulatory Visit: Payer: Self-pay

## 2019-05-01 ENCOUNTER — Encounter: Payer: Self-pay | Admitting: Cardiovascular Disease

## 2019-05-01 VITALS — BP 130/64 | HR 68 | Ht 69.0 in | Wt 217.0 lb

## 2019-05-01 DIAGNOSIS — I4821 Permanent atrial fibrillation: Secondary | ICD-10-CM | POA: Diagnosis not present

## 2019-05-01 DIAGNOSIS — I10 Essential (primary) hypertension: Secondary | ICD-10-CM | POA: Diagnosis not present

## 2019-05-01 DIAGNOSIS — I714 Abdominal aortic aneurysm, without rupture, unspecified: Secondary | ICD-10-CM

## 2019-05-01 NOTE — Patient Instructions (Signed)

## 2019-05-01 NOTE — Progress Notes (Signed)
SUBJECTIVE:  The patient presents for follow-up of permanent atrial fibrillation.  He has a Multimedia programmer.  He has an abdominal aortic aneurysm followed by vascular surgery.  Transesophageal echocardiogram on 04/30/16 demonstrated normal left ventricular systolic function, [id number], with mild aortic and mitral regurgitation.  ECG performed today which I personally reviewed demonstrates rate controlled atrial fibrillation.  He denies palpitations.  He has gallbladder colic episodes which produced right-sided chest pains particularly after eating cabbage.  He has put on weight and has not been exercising and has become a little bit more short of breath but is still able to rake leaves for 2 hours in his yard.   Social history: He and his wife are originally from the [place], [place] area. Their daughter lives in the [place], [place] area and their son lives in [place], [place].  Review of Systems: As per "subjective", otherwise negative.  Allergies  Allergen Reactions  . Statins Other (See Comments)    Muscle aches Muscle aches  . Rivaroxaban Other (See Comments)    Developed redness in both legs Developed redness in both legs    Current Outpatient Medications  Medication Sig Dispense Refill  . acetaminophen (TYLENOL) 500 MG tablet Take 1,000 mg by mouth every 8 (eight) hours as needed (pain).    Marland Kitchen aspirin EC 325 MG tablet Take 325 mg by mouth daily.    . cetirizine (ZYRTEC) 10 MG tablet Take 10 mg by mouth at bedtime.     Marland Kitchen diltiazem (CARDIZEM CD) 240 MG 24 hr capsule Take 240 mg by mouth at bedtime.     . fluticasone (FLONASE) 50 MCG/ACT nasal spray Place 1 spray into both nostrils 2 (two) times daily.     Marland Kitchen LORazepam (ATIVAN) 0.5 MG tablet Take 0.5 mg by mouth every 8 (eight) hours.    . meclizine (ANTIVERT) 25 MG tablet Take 25 mg by mouth 2 (two) times daily. For dizziness    . metoprolol succinate (TOPROL-XL) 25 MG 24 hr tablet Take 0.5 tablets (12.5 mg  total) by mouth daily.    . Multiple Vitamin (MULTIVITAMIN) tablet Take 1 tablet by mouth daily.    . pantoprazole (PROTONIX) 40 MG tablet TAKE 1 TABLET BY MOUTH DAILY 30 MINUTES PRIOR TO BREAKFAST. 30 tablet 3  . tamsulosin (FLOMAX) 0.4 MG CAPS capsule Take 0.4 mg by mouth daily.    Marland Kitchen triamterene-hydrochlorothiazide (MAXZIDE-25) 37.5-25 MG per tablet Take 1 tablet by mouth daily.     No current facility-administered medications for this visit.     Past Medical History:  Diagnosis Date  . AAA (abdominal aortic aneurysm) ([place])   . Aneurysm ([place])   . Arthritis   . Degenerative joint disease   . History of blood transfusion 12/2007   "bleeding for Warfarin"  . Hyperlipidemia   . Hypertension    Admitted for chest pain and 06/2011-neg stress Myoview,mod. LVH with nl EF on echo; TSH of 3.75  . Meniere's disease   . Nephrolithiasis   . 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 Permanent atrial fibrillation ([place])    a. s/p Watchman  . Renal hemorrhage, right 2009   2009 while anticoagulated with Coumadin; treated with the renal artery embolization    Past Surgical History:  Procedure Laterality Date  . BIOPSY  02/21/2017   Procedure: BIOPSY;  Surgeon: Danie Binder, MD;  [facility];  Service: Endoscopy;;  gastric  .  COLONOSCOPY N/A 02/21/2017   multiple polyps, one requiring piecemeal removal with epi and clip placement, multiple small and large mouthed diverticula, recto-sigmoid and sigmoid with significant excess looping, internal hemorrhoids. Path with one advanced adenoma, 3 simple adenomas, 2 hyperplastic polyps. Survelilance 3 years   . EMBOLIZATION  12/2007   Left renal hematoma, status post angiogram and embolization/notes 09/29/2010  . ESOPHAGOGASTRODUODENOSCOPY N/A 02/21/2017   benign-appearing esophageal stenosis s/p dilation, gastritis, scarred mucosa in pylorus due to prior PUD. reactive gastropathy on path  .  FRACTURE SURGERY    . LEFT ATRIAL APPENDAGE OCCLUSION N/A 03/18/2016   Procedure: LEFT ATRIAL APPENDAGE OCCLUSION;  Surgeon: [name], MD;  Location: [facility];  Service: Cardiovascular;  Laterality: N/A;  . ORIF FEMUR FRACTURE Right 1960   "2 steel rods in leg"  . PILONIDAL CYST EXCISION    . POLYPECTOMY  02/21/2017   Procedure: POLYPECTOMY;  Surgeon: Danie Binder, MD;  [facility];  Service: Endoscopy;;  colon  . SAVORY DILATION N/A 02/21/2017   Procedure: SAVORY DILATION;  Surgeon: Danie Binder, MD;  [facility];  Service: Endoscopy;  Laterality: N/A;  . TEE WITHOUT CARDIOVERSION N/A 03/10/2016   Procedure: TRANSESOPHAGEAL ECHOCARDIOGRAM (TEE);  Surgeon: Josue Hector, MD;  Location: [facility];  Service: Cardiovascular;  Laterality: N/A;  . TEE WITHOUT CARDIOVERSION N/A 04/30/2016   Procedure: TRANSESOPHAGEAL ECHOCARDIOGRAM (TEE);  Surgeon: Josue Hector, MD;  Location: [facility];  Service: Cardiovascular;  Laterality: N/A;  . Watchman procedure  02/2016    Social History   Socioeconomic History  . Marital status: Married    Spouse name: Not on file  . Number of children: 2  . Years of education: Not on file  . Highest education level: Not on file  Occupational History  . Occupation: Retired    Comment: Chief Technology Officer job  Social Needs  . Financial resource strain: Not on file  . Food insecurity    Worry: Not on file    Inability: Not on file  . Transportation needs    Medical: Not on file    Non-medical: Not on file  Tobacco Use  . Smoking status: Former Smoker    Packs/day: 1.00    Years: 40.00    Pack years: 40.00    Quit date: 01/20/2008    Years since quitting: 11.2  . Smokeless tobacco: Never Used  Substance and Sexual Activity  . Alcohol use: Not Currently    Alcohol/week: 1.0 standard drinks    Types: 1 Standard drinks or equivalent per week    Frequency: Never    Comment: rare wine  . Drug use:  No  . Sexual activity: Yes  Lifestyle  . Physical activity    Days per week: Not on file    Minutes per session: Not on file  . Stress: Not on file  Relationships  . Social Herbalist on phone: Not on file    Gets together: Not on file    Attends religious service: Not on file    Active member of club or organization: Not on file    Attends meetings of clubs or organizations: Not on file    Relationship status: Not on file  . Intimate partner violence    Fear of current or ex partner: Not on file    Emotionally abused: Not on file    Physically abused: Not on file    Forced sexual activity: Not on file  Other Topics Concern  . Not on file  Social History Narrative   Retired from Architect   Lives in [place]:   05/01/19 1522  BP: 130/64  Pulse: 68  SpO2: 98%  Weight: 217 lb (98.4 kg)  Height: 5\' 9"  (1.753 m)    Wt Readings from Last 3 Encounters:  05/01/19 217 lb (98.4 kg)  06/27/18 206 lb (93.4 kg)  06/20/18 206 lb (93.4 kg)     PHYSICAL EXAM General: NAD HEENT: Normal. Neck: No JVD, no thyromegaly. Lungs: Clear to auscultation bilaterally with normal respiratory effort. CV: Regular rate and  irregular rhythm, normal S1/S2, no S3, no murmur. No pretibial or periankle edema.  No carotid bruit.   Abdomen: Soft, nontender, no distention.  Neurologic: Alert and oriented.  Psych: Normal affect. Skin: Normal. Musculoskeletal: No gross deformities.      Labs: Lab Results  Component Value Date/Time   K 4.3 03/29/2018 01:52 PM   BUN 15 03/29/2018 01:52 PM   CREATININE 1.10 03/29/2018 01:52 PM   CREATININE 1.30 12/08/2012 07:05 AM   ALT 17 04/04/2016 08:18 PM   TSH 3.758 06/19/2012 07:15 AM   HGB 13.6 03/29/2018 01:52 PM     Lipids: Lab Results  Component Value Date/Time   LDLCALC 91 12/08/2012 07:05 AM   CHOL 161 12/08/2012 07:05 AM   TRIG 165 (H) 12/08/2012 07:05 AM   HDL 37 (L) 12/08/2012 07:05 AM       ASSESSMENT  AND PLAN:  1.  Permanent atrial fibrillation: Symptomatically stable. Heart rate is controlled on long-acting diltiazem and long-acting metoprolol. He is maintained on aspirin and has a Watchman device.  2. Hypertension: Controlled. No changes.  3. Abdominal aortic aneurysm: Measured 5.1 cm in diameter on 06/21/18. Followed by vascular surgery.    Disposition: Follow up 1 yr   Kate Sable, M.D., F.A.C.C.

## 2019-05-28 DIAGNOSIS — E782 Mixed hyperlipidemia: Secondary | ICD-10-CM | POA: Diagnosis not present

## 2019-05-28 DIAGNOSIS — R7301 Impaired fasting glucose: Secondary | ICD-10-CM | POA: Diagnosis not present

## 2019-05-28 DIAGNOSIS — R7303 Prediabetes: Secondary | ICD-10-CM | POA: Diagnosis not present

## 2019-05-28 DIAGNOSIS — I719 Aortic aneurysm of unspecified site, without rupture: Secondary | ICD-10-CM | POA: Diagnosis not present

## 2019-05-28 DIAGNOSIS — K219 Gastro-esophageal reflux disease without esophagitis: Secondary | ICD-10-CM | POA: Diagnosis not present

## 2019-05-28 DIAGNOSIS — I482 Chronic atrial fibrillation, unspecified: Secondary | ICD-10-CM | POA: Diagnosis not present

## 2019-05-28 DIAGNOSIS — I10 Essential (primary) hypertension: Secondary | ICD-10-CM | POA: Diagnosis not present

## 2019-06-12 ENCOUNTER — Ambulatory Visit: Payer: PPO | Admitting: Vascular Surgery

## 2019-06-12 ENCOUNTER — Other Ambulatory Visit (HOSPITAL_COMMUNITY): Payer: PPO

## 2019-06-12 DIAGNOSIS — I482 Chronic atrial fibrillation, unspecified: Secondary | ICD-10-CM | POA: Diagnosis not present

## 2019-06-12 DIAGNOSIS — I719 Aortic aneurysm of unspecified site, without rupture: Secondary | ICD-10-CM | POA: Diagnosis not present

## 2019-06-12 DIAGNOSIS — K219 Gastro-esophageal reflux disease without esophagitis: Secondary | ICD-10-CM | POA: Diagnosis not present

## 2019-06-12 DIAGNOSIS — R7301 Impaired fasting glucose: Secondary | ICD-10-CM | POA: Diagnosis not present

## 2019-06-12 DIAGNOSIS — I10 Essential (primary) hypertension: Secondary | ICD-10-CM | POA: Diagnosis not present

## 2019-06-12 DIAGNOSIS — R7303 Prediabetes: Secondary | ICD-10-CM | POA: Diagnosis not present

## 2019-06-12 DIAGNOSIS — E782 Mixed hyperlipidemia: Secondary | ICD-10-CM | POA: Diagnosis not present

## 2019-06-25 ENCOUNTER — Telehealth (HOSPITAL_COMMUNITY): Payer: Self-pay

## 2019-06-25 NOTE — Telephone Encounter (Signed)

## 2019-06-26 ENCOUNTER — Other Ambulatory Visit: Payer: Self-pay

## 2019-06-26 ENCOUNTER — Encounter: Payer: Self-pay | Admitting: Vascular Surgery

## 2019-06-26 ENCOUNTER — Ambulatory Visit: Payer: PPO | Admitting: Vascular Surgery

## 2019-06-26 ENCOUNTER — Ambulatory Visit (HOSPITAL_COMMUNITY)
Admission: RE | Admit: 2019-06-26 | Discharge: 2019-06-26 | Disposition: A | Discharge status: Home / Self Care | Payer: PPO | Source: Ambulatory Visit | Attending: Surgery | Admitting: Surgery

## 2019-06-26 VITALS — BP 137/84 | HR 72 | Temp 97.3°F | Resp 16 | Ht 69.0 in | Wt 209.0 lb

## 2019-06-26 DIAGNOSIS — I714 Abdominal aortic aneurysm, without rupture, unspecified: Secondary | ICD-10-CM

## 2019-06-26 NOTE — Progress Notes (Signed)
Patient name: [name] MRN: [id number] DOB: January 26, 1941 Sex: male  REASON FOR VISIT: 6 month follow-up for 5.1 cm AAA  HPI: [name] is a 79 y.o. male with history of atrial fibrillation status post watchman now off anticoagulation, hypertension, hyperlipidemia that presents for 6 month follow-up for 5.1 cm AAA.  As previously noted he has known about his aneurysm since 2009.  He reports no significant changes in the last 6 months to his health.  He does have issues with some lower bloating and gas.  The aneurysm previously measured 4.6 cm on 07/29/2017 and then approximately 1 year later it measured 5.1 cm.  He denies any previous abdominal surgery.  He denies tobacco abuse.    Past Medical History:  Diagnosis Date  . AAA (abdominal aortic aneurysm) ([place])   . Aneurysm ([place])   . Arthritis   . Degenerative joint disease   . History of blood transfusion 12/2007   "bleeding for Warfarin"  . Hyperlipidemia   . Hypertension    Admitted for chest pain and 06/2011-neg stress Myoview,mod. LVH with nl EF on echo; TSH of 3.75  . Meniere's disease   . Nephrolithiasis   . 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 Permanent atrial fibrillation ([place])    a. s/p Watchman  . Renal hemorrhage, right 2009   2009 while anticoagulated with Coumadin; treated with the renal artery embolization    Past Surgical History:  Procedure Laterality Date  . BIOPSY  02/21/2017   Procedure: BIOPSY;  Surgeon: Danie Binder, MD;  [facility];  Service: Endoscopy;;  gastric  . COLONOSCOPY N/A 02/21/2017   multiple polyps, one requiring piecemeal removal with epi and clip placement, multiple small and large mouthed diverticula, recto-sigmoid and sigmoid with significant excess looping, internal hemorrhoids. Path with one advanced adenoma, 3 simple adenomas, 2 hyperplastic polyps. Survelilance 3 years   . EMBOLIZATION  12/2007   Left renal  hematoma, status post angiogram and embolization/notes 09/29/2010  . ESOPHAGOGASTRODUODENOSCOPY N/A 02/21/2017   benign-appearing esophageal stenosis s/p dilation, gastritis, scarred mucosa in pylorus due to prior PUD. reactive gastropathy on path  . FRACTURE SURGERY    . LEFT ATRIAL APPENDAGE OCCLUSION N/A 03/18/2016   Procedure: LEFT ATRIAL APPENDAGE OCCLUSION;  Surgeon: [name], MD;  Location: [facility];  Service: Cardiovascular;  Laterality: N/A;  . ORIF FEMUR FRACTURE Right 1960   "2 steel rods in leg"  . PILONIDAL CYST EXCISION    . POLYPECTOMY  02/21/2017   Procedure: POLYPECTOMY;  Surgeon: Danie Binder, MD;  [facility];  Service: Endoscopy;;  colon  . SAVORY DILATION N/A 02/21/2017   Procedure: SAVORY DILATION;  Surgeon: Danie Binder, MD;  [facility];  Service: Endoscopy;  Laterality: N/A;  . TEE WITHOUT CARDIOVERSION N/A 03/10/2016   Procedure: TRANSESOPHAGEAL ECHOCARDIOGRAM (TEE);  Surgeon: Josue Hector, MD;  Location: [facility];  Service: Cardiovascular;  Laterality: N/A;  . TEE WITHOUT CARDIOVERSION N/A 04/30/2016   Procedure: TRANSESOPHAGEAL ECHOCARDIOGRAM (TEE);  Surgeon: Josue Hector, MD;  Location: [facility];  Service: Cardiovascular;  Laterality: N/A;  . Watchman procedure  02/2016    Family History  Problem Relation Age of Onset  . Hypertension Mother   . Hyperlipidemia Mother   . Renal cancer Mother   . Atrial fibrillation Sister   . Stroke Father   . Colon cancer Cousin   . Colon  polyps Neg Hx     SOCIAL HISTORY: Social History   Tobacco Use  . Smoking status: Former Smoker    Packs/day: 1.00    Years: 40.00    Pack years: 40.00    Quit date: 01/20/2008    Years since quitting: 11.4  . Smokeless tobacco: Never Used  Substance Use Topics  . Alcohol use: Not Currently    Alcohol/week: 1.0 standard drinks    Types: 1 Standard drinks or equivalent per week    Comment: rare wine    Allergies  Allergen  Reactions  . Statins Other (See Comments)    Muscle aches Muscle aches  . Rivaroxaban Other (See Comments)    Developed redness in both legs Developed redness in both legs    Current Outpatient Medications  Medication Sig Dispense Refill  . acetaminophen (TYLENOL) 500 MG tablet Take 1,000 mg by mouth every 8 (eight) hours as needed (pain).    Marland Kitchen aspirin EC 325 MG tablet Take 325 mg by mouth daily.    . cetirizine (ZYRTEC) 10 MG tablet Take 10 mg by mouth at bedtime.     Marland Kitchen diltiazem (CARDIZEM CD) 240 MG 24 hr capsule Take 240 mg by mouth at bedtime.     . fluticasone (FLONASE) 50 MCG/ACT nasal spray Place 1 spray into both nostrils 2 (two) times daily.     Marland Kitchen LORazepam (ATIVAN) 0.5 MG tablet Take 0.5 mg by mouth every 8 (eight) hours.    . meclizine (ANTIVERT) 25 MG tablet Take 25 mg by mouth 2 (two) times daily. For dizziness    . metoprolol succinate (TOPROL-XL) 25 MG 24 hr tablet Take 0.5 tablets (12.5 mg total) by mouth daily.    . Multiple Vitamin (MULTIVITAMIN) tablet Take 1 tablet by mouth daily.    . pantoprazole (PROTONIX) 40 MG tablet TAKE 1 TABLET BY MOUTH DAILY 30 MINUTES PRIOR TO BREAKFAST. 30 tablet 3  . tamsulosin (FLOMAX) 0.4 MG CAPS capsule Take 0.4 mg by mouth daily.    Marland Kitchen triamterene-hydrochlorothiazide (MAXZIDE-25) 37.5-25 MG per tablet Take 1 tablet by mouth daily.     No current facility-administered medications for this visit.    REVIEW OF SYSTEMS:  [X]  denotes positive finding, [ ]  denotes negative finding Cardiac  Comments:  Chest pain or chest pressure:    Shortness of breath upon exertion:    Short of breath when lying flat:    Irregular heart rhythm:        Vascular    Pain in calf, thigh, or hip brought on by ambulation:    Pain in feet at night that wakes you up from your sleep:     Blood clot in your veins:    Leg swelling:         Pulmonary    Oxygen at home:    Productive cough:     Wheezing:         Neurologic    Sudden weakness in arms or  legs:     Sudden numbness in arms or legs:     Sudden onset of difficulty speaking or slurred speech:    Temporary loss of vision in one eye:     Problems with dizziness:         Gastrointestinal    Blood in stool:     Vomited blood:         Genitourinary    Burning when urinating:     Blood in urine:  Psychiatric    Major depression:         Hematologic    Bleeding problems:    Problems with blood clotting too easily:        Skin    Rashes or ulcers:        Constitutional    Fever or chills:      PHYSICAL EXAM: Vitals:   06/26/19 0953  BP: 137/84  Pulse: 72  Resp: 16  Temp: (!) 97.3 F (36.3 C)  TempSrc: Temporal  SpO2: 96%  Weight: 209 lb (94.8 kg)  Height: 5\' 9"  (1.753 m)    GENERAL: The patient is a well-nourished male, in no acute distress. The vital signs are documented above. CARDIAC: There is a regular rate and rhythm.  VASCULAR:  2+ femoral pulse palpable bilateral groins 2+ PT palpable bilateral lower extremities PULMONARY: There is good air exchange bilaterally without wheezing or rales. ABDOMEN: Soft and non-tender with normal pitched bowel sounds. No pain with palpation of aneurysm. MUSCULOSKELETAL: There are no major deformities or cyanosis. NEUROLOGIC: No focal weakness or paresthesias are detected.   DATA:   CT abdomen pelvis 06/26/18: On my review there is an approximate 5.1 cm infrarenal abdominal aortic aneurysm.  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   EVAR duplex today shows no significant growth in the aneurysm measuring 5.1 to 5.19 cm  Assessment/Plan:  22-year male presents for ongoing surveillance of his abdominal aortic aneurysm.  There has been no significant growth in the last 6 months and now measures 5.1 to 5.19 cm based on repeat duplex.   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  I will see him back in 6 months with another aneurysm duplex for ongoing surveillance.  Discussed typically guidelines for repair are greater than 5.5 cm unless there is rapid growth of the aneurysm.    I did confirm with him that he is being followed by urology for this incidental right renal mass has been previously noted on CT.    [name], MD [facility] Office: [phone] Pager: [place]

## 2019-06-27 ENCOUNTER — Other Ambulatory Visit: Payer: Self-pay | Admitting: *Deleted

## 2019-06-27 DIAGNOSIS — I714 Abdominal aortic aneurysm, without rupture, unspecified: Secondary | ICD-10-CM

## 2019-07-13 DIAGNOSIS — I10 Essential (primary) hypertension: Secondary | ICD-10-CM | POA: Diagnosis not present

## 2019-07-13 DIAGNOSIS — R7301 Impaired fasting glucose: Secondary | ICD-10-CM | POA: Diagnosis not present

## 2019-07-13 DIAGNOSIS — I719 Aortic aneurysm of unspecified site, without rupture: Secondary | ICD-10-CM | POA: Diagnosis not present

## 2019-07-13 DIAGNOSIS — R7303 Prediabetes: Secondary | ICD-10-CM | POA: Diagnosis not present

## 2019-07-13 DIAGNOSIS — I482 Chronic atrial fibrillation, unspecified: Secondary | ICD-10-CM | POA: Diagnosis not present

## 2019-07-13 DIAGNOSIS — K219 Gastro-esophageal reflux disease without esophagitis: Secondary | ICD-10-CM | POA: Diagnosis not present

## 2019-07-13 DIAGNOSIS — E782 Mixed hyperlipidemia: Secondary | ICD-10-CM | POA: Diagnosis not present

## 2019-08-15 DIAGNOSIS — I482 Chronic atrial fibrillation, unspecified: Secondary | ICD-10-CM | POA: Diagnosis not present

## 2019-08-15 DIAGNOSIS — E782 Mixed hyperlipidemia: Secondary | ICD-10-CM | POA: Diagnosis not present

## 2019-08-15 DIAGNOSIS — I719 Aortic aneurysm of unspecified site, without rupture: Secondary | ICD-10-CM | POA: Diagnosis not present

## 2019-08-15 DIAGNOSIS — I10 Essential (primary) hypertension: Secondary | ICD-10-CM | POA: Diagnosis not present

## 2019-08-15 DIAGNOSIS — R7301 Impaired fasting glucose: Secondary | ICD-10-CM | POA: Diagnosis not present

## 2019-08-15 DIAGNOSIS — K219 Gastro-esophageal reflux disease without esophagitis: Secondary | ICD-10-CM | POA: Diagnosis not present

## 2019-08-15 DIAGNOSIS — R7303 Prediabetes: Secondary | ICD-10-CM | POA: Diagnosis not present

## 2019-08-23 ENCOUNTER — Ambulatory Visit: Payer: PPO | Attending: Internal Medicine

## 2019-08-23 DIAGNOSIS — Z23 Encounter for immunization: Secondary | ICD-10-CM

## 2019-08-23 NOTE — Progress Notes (Signed)
   Covid-19 Vaccination Clinic  Name:  AZZIE CLAYBAUGH    MRN: [id number] DOB: 08-30-1940  08/23/2019  Mr. Totzke was observed post Covid-19 immunization for 15 minutes without incident. He was provided with Vaccine Information Sheet and instruction to access the V-Safe system.   Mr. Shoults was instructed to call 911 with any severe reactions post vaccine: Marland Kitchen Difficulty breathing  . Swelling of face and throat  . A fast heartbeat  . A bad rash all over body  . Dizziness and weakness   Immunizations Administered    Name Date Dose VIS Date Route   Moderna COVID-19 Vaccine 08/23/2019  9:55 AM 0.5 mL 05/01/2019 Intramuscular   Manufacturer: Moderna   Lot: [id number]   [id number]

## 2019-09-25 ENCOUNTER — Ambulatory Visit: Payer: PPO | Attending: Internal Medicine

## 2019-09-25 DIAGNOSIS — Z23 Encounter for immunization: Secondary | ICD-10-CM

## 2019-09-25 NOTE — Progress Notes (Signed)
   Covid-19 Vaccination Clinic  Name:  [name]    MRN: [id number] DOB: [birth year]  09/25/2019  [name] was observed post Covid-19 immunization for 15 minutes without incident. He was provided with Vaccine Information Sheet and instruction to access the V-Safe system.   [name] was instructed to call 911 with any severe reactions post vaccine: Marland Kitchen Difficulty breathing  . Swelling of face and throat  . A fast heartbeat  . A bad rash all over body  . Dizziness and weakness   Immunizations Administered    Name Date Dose VIS Date Route   Moderna COVID-19 Vaccine 09/25/2019  9:25 AM 0.5 mL 05/2019 Intramuscular   Manufacturer: Moderna   Lot: [id number]   [id number]

## 2019-10-04 DIAGNOSIS — R350 Frequency of micturition: Secondary | ICD-10-CM | POA: Diagnosis not present

## 2019-10-11 DIAGNOSIS — F5101 Primary insomnia: Secondary | ICD-10-CM | POA: Diagnosis not present

## 2019-10-11 DIAGNOSIS — I10 Essential (primary) hypertension: Secondary | ICD-10-CM | POA: Diagnosis not present

## 2019-10-11 DIAGNOSIS — D4111 Neoplasm of uncertain behavior of right renal pelvis: Secondary | ICD-10-CM | POA: Diagnosis not present

## 2019-10-11 DIAGNOSIS — J069 Acute upper respiratory infection, unspecified: Secondary | ICD-10-CM | POA: Diagnosis not present

## 2019-10-11 DIAGNOSIS — G4489 Other headache syndrome: Secondary | ICD-10-CM | POA: Diagnosis not present

## 2019-10-11 DIAGNOSIS — I959 Hypotension, unspecified: Secondary | ICD-10-CM | POA: Diagnosis not present

## 2019-10-11 DIAGNOSIS — D181 Lymphangioma, any site: Secondary | ICD-10-CM | POA: Diagnosis not present

## 2019-10-11 DIAGNOSIS — I482 Chronic atrial fibrillation, unspecified: Secondary | ICD-10-CM | POA: Diagnosis not present

## 2019-10-11 DIAGNOSIS — H8103 Meniere's disease, bilateral: Secondary | ICD-10-CM | POA: Diagnosis not present

## 2019-10-11 DIAGNOSIS — E782 Mixed hyperlipidemia: Secondary | ICD-10-CM | POA: Diagnosis not present

## 2019-10-11 DIAGNOSIS — I719 Aortic aneurysm of unspecified site, without rupture: Secondary | ICD-10-CM | POA: Diagnosis not present

## 2019-10-11 DIAGNOSIS — I712 Thoracic aortic aneurysm, without rupture: Secondary | ICD-10-CM | POA: Diagnosis not present

## 2019-10-17 DIAGNOSIS — Z0001 Encounter for general adult medical examination with abnormal findings: Secondary | ICD-10-CM | POA: Diagnosis not present

## 2019-10-17 DIAGNOSIS — R944 Abnormal results of kidney function studies: Secondary | ICD-10-CM | POA: Diagnosis not present

## 2019-10-17 DIAGNOSIS — R519 Headache, unspecified: Secondary | ICD-10-CM | POA: Diagnosis not present

## 2019-10-17 DIAGNOSIS — I4821 Permanent atrial fibrillation: Secondary | ICD-10-CM | POA: Diagnosis not present

## 2019-10-17 DIAGNOSIS — D4111 Neoplasm of uncertain behavior of right renal pelvis: Secondary | ICD-10-CM | POA: Diagnosis not present

## 2019-10-17 DIAGNOSIS — E782 Mixed hyperlipidemia: Secondary | ICD-10-CM | POA: Diagnosis not present

## 2019-10-17 DIAGNOSIS — I714 Abdominal aortic aneurysm, without rupture: Secondary | ICD-10-CM | POA: Diagnosis not present

## 2019-10-17 DIAGNOSIS — R7303 Prediabetes: Secondary | ICD-10-CM | POA: Diagnosis not present

## 2019-10-17 DIAGNOSIS — R7301 Impaired fasting glucose: Secondary | ICD-10-CM | POA: Diagnosis not present

## 2019-10-17 DIAGNOSIS — R21 Rash and other nonspecific skin eruption: Secondary | ICD-10-CM | POA: Diagnosis not present

## 2019-10-17 DIAGNOSIS — N401 Enlarged prostate with lower urinary tract symptoms: Secondary | ICD-10-CM | POA: Diagnosis not present

## 2019-10-17 DIAGNOSIS — I10 Essential (primary) hypertension: Secondary | ICD-10-CM | POA: Diagnosis not present

## 2019-10-31 DIAGNOSIS — X32XXXA Exposure to sunlight, initial encounter: Secondary | ICD-10-CM | POA: Diagnosis not present

## 2019-10-31 DIAGNOSIS — L570 Actinic keratosis: Secondary | ICD-10-CM | POA: Diagnosis not present

## 2019-10-31 DIAGNOSIS — D485 Neoplasm of uncertain behavior of skin: Secondary | ICD-10-CM | POA: Diagnosis not present

## 2019-10-31 DIAGNOSIS — L7211 Pilar cyst: Secondary | ICD-10-CM | POA: Diagnosis not present

## 2019-10-31 DIAGNOSIS — C44619 Basal cell carcinoma of skin of left upper limb, including shoulder: Secondary | ICD-10-CM | POA: Diagnosis not present

## 2019-10-31 DIAGNOSIS — D225 Melanocytic nevi of trunk: Secondary | ICD-10-CM | POA: Diagnosis not present

## 2019-12-05 DIAGNOSIS — R944 Abnormal results of kidney function studies: Secondary | ICD-10-CM | POA: Diagnosis not present

## 2019-12-05 DIAGNOSIS — I4821 Permanent atrial fibrillation: Secondary | ICD-10-CM | POA: Diagnosis not present

## 2019-12-05 DIAGNOSIS — R21 Rash and other nonspecific skin eruption: Secondary | ICD-10-CM | POA: Diagnosis not present

## 2019-12-05 DIAGNOSIS — R519 Headache, unspecified: Secondary | ICD-10-CM | POA: Diagnosis not present

## 2019-12-05 DIAGNOSIS — E782 Mixed hyperlipidemia: Secondary | ICD-10-CM | POA: Diagnosis not present

## 2019-12-05 DIAGNOSIS — I714 Abdominal aortic aneurysm, without rupture: Secondary | ICD-10-CM | POA: Diagnosis not present

## 2019-12-05 DIAGNOSIS — D4111 Neoplasm of uncertain behavior of right renal pelvis: Secondary | ICD-10-CM | POA: Diagnosis not present

## 2019-12-05 DIAGNOSIS — I10 Essential (primary) hypertension: Secondary | ICD-10-CM | POA: Diagnosis not present

## 2019-12-05 DIAGNOSIS — R7303 Prediabetes: Secondary | ICD-10-CM | POA: Diagnosis not present

## 2019-12-05 DIAGNOSIS — Z0001 Encounter for general adult medical examination with abnormal findings: Secondary | ICD-10-CM | POA: Diagnosis not present

## 2019-12-05 DIAGNOSIS — N401 Enlarged prostate with lower urinary tract symptoms: Secondary | ICD-10-CM | POA: Diagnosis not present

## 2019-12-05 DIAGNOSIS — R7301 Impaired fasting glucose: Secondary | ICD-10-CM | POA: Diagnosis not present

## 2019-12-19 DIAGNOSIS — C44619 Basal cell carcinoma of skin of left upper limb, including shoulder: Secondary | ICD-10-CM | POA: Diagnosis not present

## 2020-01-16 DIAGNOSIS — I700 Atherosclerosis of aorta: Secondary | ICD-10-CM | POA: Diagnosis not present

## 2020-01-16 DIAGNOSIS — I714 Abdominal aortic aneurysm, without rupture: Secondary | ICD-10-CM | POA: Diagnosis not present

## 2020-01-16 DIAGNOSIS — D49511 Neoplasm of unspecified behavior of right kidney: Secondary | ICD-10-CM | POA: Diagnosis not present

## 2020-01-16 DIAGNOSIS — D49512 Neoplasm of unspecified behavior of left kidney: Secondary | ICD-10-CM | POA: Diagnosis not present

## 2020-01-16 DIAGNOSIS — K8020 Calculus of gallbladder without cholecystitis without obstruction: Secondary | ICD-10-CM | POA: Diagnosis not present

## 2020-01-16 DIAGNOSIS — K5730 Diverticulosis of large intestine without perforation or abscess without bleeding: Secondary | ICD-10-CM | POA: Diagnosis not present

## 2020-01-17 DIAGNOSIS — Z0001 Encounter for general adult medical examination with abnormal findings: Secondary | ICD-10-CM | POA: Diagnosis not present

## 2020-01-17 DIAGNOSIS — E782 Mixed hyperlipidemia: Secondary | ICD-10-CM | POA: Diagnosis not present

## 2020-01-17 DIAGNOSIS — R7301 Impaired fasting glucose: Secondary | ICD-10-CM | POA: Diagnosis not present

## 2020-01-17 DIAGNOSIS — I4821 Permanent atrial fibrillation: Secondary | ICD-10-CM | POA: Diagnosis not present

## 2020-01-17 DIAGNOSIS — R21 Rash and other nonspecific skin eruption: Secondary | ICD-10-CM | POA: Diagnosis not present

## 2020-01-17 DIAGNOSIS — D4111 Neoplasm of uncertain behavior of right renal pelvis: Secondary | ICD-10-CM | POA: Diagnosis not present

## 2020-01-17 DIAGNOSIS — I714 Abdominal aortic aneurysm, without rupture: Secondary | ICD-10-CM | POA: Diagnosis not present

## 2020-01-17 DIAGNOSIS — I10 Essential (primary) hypertension: Secondary | ICD-10-CM | POA: Diagnosis not present

## 2020-01-17 DIAGNOSIS — R944 Abnormal results of kidney function studies: Secondary | ICD-10-CM | POA: Diagnosis not present

## 2020-01-17 DIAGNOSIS — R7303 Prediabetes: Secondary | ICD-10-CM | POA: Diagnosis not present

## 2020-01-17 DIAGNOSIS — N401 Enlarged prostate with lower urinary tract symptoms: Secondary | ICD-10-CM | POA: Diagnosis not present

## 2020-01-17 DIAGNOSIS — R519 Headache, unspecified: Secondary | ICD-10-CM | POA: Diagnosis not present

## 2020-01-21 DIAGNOSIS — D49512 Neoplasm of unspecified behavior of left kidney: Secondary | ICD-10-CM | POA: Diagnosis not present

## 2020-01-21 DIAGNOSIS — R3915 Urgency of urination: Secondary | ICD-10-CM | POA: Diagnosis not present

## 2020-01-21 DIAGNOSIS — N401 Enlarged prostate with lower urinary tract symptoms: Secondary | ICD-10-CM | POA: Diagnosis not present

## 2020-01-30 DIAGNOSIS — Z85828 Personal history of other malignant neoplasm of skin: Secondary | ICD-10-CM | POA: Diagnosis not present

## 2020-01-30 DIAGNOSIS — Z08 Encounter for follow-up examination after completed treatment for malignant neoplasm: Secondary | ICD-10-CM | POA: Diagnosis not present

## 2020-02-12 ENCOUNTER — Ambulatory Visit: Payer: PPO | Admitting: Vascular Surgery

## 2020-02-12 ENCOUNTER — Encounter: Payer: Self-pay | Admitting: Vascular Surgery

## 2020-02-12 ENCOUNTER — Other Ambulatory Visit: Payer: Self-pay

## 2020-02-12 ENCOUNTER — Other Ambulatory Visit (HOSPITAL_COMMUNITY): Payer: PPO

## 2020-02-12 VITALS — BP 137/86 | HR 67 | Temp 97.6°F | Resp 18 | Ht 69.0 in | Wt 214.0 lb

## 2020-02-12 DIAGNOSIS — I714 Abdominal aortic aneurysm, without rupture, unspecified: Secondary | ICD-10-CM

## 2020-02-12 NOTE — Progress Notes (Signed)
Patient name: [name] MRN: [id number] DOB: May 26, 1941 Sex: male  REASON FOR VISIT: F/U to discuss enlarging AAA  HPI: [name] is a 79 y.o. male with history of atrial fibrillation status post watchman now off anticoagulation, hypertension, hyperlipidemia that presents to discuss enlarging AAA.  He was last seen 6 month ago for a  5.1 cm AAA that had enlarged to 5.19 cm.  As previously noted he has known about his aneurysm since 2009.    I was recently contacted by his urologist after he had a CT abdomen for surveillance of a renal mass that showed enlarging AAA.  Ultimately gets back in the office today to discuss his CT scan.  He still reports no abdominal or back pain.  He denies any previous abdominal surgeries.  He does not smoke.  In review of previous growth of the aneurysm was 4.6 on 07/29/2017 enlarged to 5.1 and then 5.19 6 months ago.  I reviewed the CT abdomen from 01/16/2020 obtained by his urologist that shows now 5.9 cm abdominal aortic aneurysm.  States he can walk 2 laps around the track without stopping which is approximately half a mile.  No active chest or or shortness of breath at this time.  Past Medical History:  Diagnosis Date  . AAA (abdominal aortic aneurysm) ([place])   . Aneurysm ([place])   . Arthritis   . Degenerative joint disease   . History of blood transfusion 12/2007   "bleeding for Warfarin"  . Hyperlipidemia   . Hypertension    Admitted for chest pain and 06/2011-neg stress Myoview,mod. LVH with nl EF on echo; TSH of 3.75  . Meniere's disease   . Nephrolithiasis   . Peripheral vascular disease ([place])    Small AAA (3.7-4cm 09/2010), small SMA aneurysm (61mm)  . Permanent atrial fibrillation ([place])    a. s/p Watchman  . Renal hemorrhage, right 2009   2009 while anticoagulated with Coumadin; treated with the renal artery embolization    Past Surgical History:  Procedure Laterality Date  . BIOPSY  02/21/2017   Procedure: BIOPSY;  Surgeon: Danie Binder, MD;  [facility];  Service: Endoscopy;;  gastric  . COLONOSCOPY N/A 02/21/2017   multiple polyps, one requiring piecemeal removal with epi and clip placement, multiple small and large mouthed diverticula, recto-sigmoid and sigmoid with significant excess looping, internal hemorrhoids. Path with one advanced adenoma, 3 simple adenomas, 2 hyperplastic polyps. Survelilance 3 years   . EMBOLIZATION  12/2007   Left renal hematoma, status post angiogram and embolization/notes 09/29/2010  . ESOPHAGOGASTRODUODENOSCOPY N/A 02/21/2017   benign-appearing esophageal stenosis s/p dilation, gastritis, scarred mucosa in pylorus due to prior PUD. reactive gastropathy on path  . FRACTURE SURGERY    . LEFT ATRIAL APPENDAGE OCCLUSION N/A 03/18/2016   Procedure: LEFT ATRIAL APPENDAGE OCCLUSION;  Surgeon: [name], MD;  Location: [facility];  Service: Cardiovascular;  Laterality: N/A;  . ORIF FEMUR FRACTURE Right 1960   "2 steel rods in leg"  . PILONIDAL CYST EXCISION    . POLYPECTOMY  02/21/2017   Procedure: POLYPECTOMY;  Surgeon: Danie Binder, MD;  [facility];  Service: Endoscopy;;  colon  . SAVORY DILATION N/A 02/21/2017   Procedure: SAVORY DILATION;  Surgeon: Danie Binder, MD;  [facility];  Service: Endoscopy;  Laterality: N/A;  . TEE WITHOUT CARDIOVERSION N/A 03/10/2016   Procedure: TRANSESOPHAGEAL ECHOCARDIOGRAM (TEE);  Surgeon: Josue Hector, MD;  Location: [facility];  Service: Cardiovascular;  Laterality: N/A;  . TEE WITHOUT CARDIOVERSION N/A 04/30/2016   Procedure: TRANSESOPHAGEAL ECHOCARDIOGRAM (TEE);  Surgeon: Josue Hector, MD;  Location: [facility];  Service: Cardiovascular;  Laterality: N/A;  . Watchman procedure  02/2016    Family History  Problem Relation Age of Onset  . Hypertension Mother   . Hyperlipidemia Mother   . Renal cancer Mother   . Atrial fibrillation Sister   . Stroke Father   . Colon cancer Cousin   . Colon  polyps Neg Hx     SOCIAL HISTORY: Social History   Tobacco Use  . Smoking status: Former Smoker    Packs/day: 1.00    Years: 40.00    Pack years: 40.00    Quit date: 01/20/2008    Years since quitting: 12.0  . Smokeless tobacco: Never Used  Substance Use Topics  . Alcohol use: Not Currently    Alcohol/week: 1.0 standard drink    Types: 1 Standard drinks or equivalent per week    Comment: rare wine    Allergies  Allergen Reactions  . Statins Other (See Comments)    Muscle aches Muscle aches  . Rivaroxaban Other (See Comments)    Developed redness in both legs Developed redness in both legs    Current Outpatient Medications  Medication Sig Dispense Refill  . acetaminophen (TYLENOL) 500 MG tablet Take 1,000 mg by mouth every 8 (eight) hours as needed (pain).    Marland Kitchen aspirin EC 325 MG tablet Take 325 mg by mouth daily.    . cetirizine (ZYRTEC) 10 MG tablet Take 10 mg by mouth at bedtime.     Marland Kitchen diltiazem (CARDIZEM CD) 240 MG 24 hr capsule Take 240 mg by mouth at bedtime.     . fluticasone (FLONASE) 50 MCG/ACT nasal spray Place 1 spray into both nostrils 2 (two) times daily.     Marland Kitchen LORazepam (ATIVAN) 0.5 MG tablet Take 0.5 mg by mouth every 8 (eight) hours.    . meclizine (ANTIVERT) 25 MG tablet Take 25 mg by mouth 2 (two) times daily. For dizziness    . metoprolol succinate (TOPROL-XL) 25 MG 24 hr tablet Take 0.5 tablets (12.5 mg total) by mouth daily.    . Multiple Vitamin (MULTIVITAMIN) tablet Take 1 tablet by mouth daily.    . pantoprazole (PROTONIX) 40 MG tablet TAKE 1 TABLET BY MOUTH DAILY 30 MINUTES PRIOR TO BREAKFAST. 30 tablet 3  . rosuvastatin (CRESTOR) 5 MG tablet Take 5 mg by mouth daily.    . tamsulosin (FLOMAX) 0.4 MG CAPS capsule Take 0.4 mg by mouth daily.    Marland Kitchen triamterene-hydrochlorothiazide (MAXZIDE-25) 37.5-25 MG per tablet Take 1 tablet by mouth daily.     No current facility-administered medications for this visit.    REVIEW OF SYSTEMS:  [X]  denotes  positive finding, [ ]  denotes negative finding Cardiac  Comments:  Chest pain or chest pressure:    Shortness of breath upon exertion:    Short of breath when lying flat:    Irregular heart rhythm:        Vascular    Pain in calf, thigh, or hip brought on by ambulation:    Pain in feet at night that wakes you up from your sleep:     Blood clot in your veins:    Leg swelling:         Pulmonary    Oxygen at home:    Productive cough:     Wheezing:  Neurologic    Sudden weakness in arms or legs:     Sudden numbness in arms or legs:     Sudden onset of difficulty speaking or slurred speech:    Temporary loss of vision in one eye:     Problems with dizziness:         Gastrointestinal    Blood in stool:     Vomited blood:         Genitourinary    Burning when urinating:     Blood in urine:        Psychiatric    Major depression:         Hematologic    Bleeding problems:    Problems with blood clotting too easily:        Skin    Rashes or ulcers:        Constitutional    Fever or chills:      PHYSICAL EXAM: Vitals:   02/12/20 1011  BP: 137/86  Pulse: 67  Resp: 18  Temp: 97.6 F (36.4 C)  TempSrc: Temporal  SpO2: 96%  Weight: 214 lb (97.1 kg)  Height: 5\' 9"  (1.753 m)    GENERAL: The patient is a well-nourished male, in no acute distress. The vital signs are documented above. CARDIAC: There is a regular rate and rhythm.  VASCULAR:  2+ femoral pulse palpable bilateral groins 2+ PT palpable bilateral lower extremities PULMONARY: There is good air exchange bilaterally without wheezing or rales. ABDOMEN: Soft and non-tender with normal pitched bowel sounds. No pain with palpation of aneurysm. MUSCULOSKELETAL: There are no major deformities or cyanosis. NEUROLOGIC: No focal weakness or paresthesias are detected.   DATA:   CT abdomen 01/16/2020 now shows 5.9 cm infrarenal abdominal aortic aneurysm that is essentially  juxtarenal.  Assessment/Plan:  38-year male presents to discuss enlarging abdominal aortic aneurysm noted on recent CT in August for surveillance of a renal mass.  Discussed that I reviewed his CT and his aneurysm now measure 5.9 cm and this enlargement would make it appropriate to consider repair.  Unfortunately he has no options for traditional endograft given really no neck and essentially juxtarenal infrarenal aneurysm.  Discussed that after review of his CT options would be likely open repair versus a fenestrated endograft.  I think an open repair would be at high risk to put him on dialysis given he would likely require suprarenal clamp and he has very diseased looking kidneys on imaging.  Discussed a number of other risks as well with open repair.  He denies any prior knowledge of kidney disease prior to his visit today.  We discussed the other option would be a fenestrated endograft with possible renal fenestrations and maybe SMA scallup.  I need to get updated CTA of his abdomen pelvis with thin slices to see if we could size an endograft here and would meet with industry representatives after new CT is obtained.  His CT from his urologist only imaged the abdomen and we need more complete imaging.  I will have him see his cardiologist for clearance in the interim.  I will have him follow-up with me after CT and cardiology evaluation to figure out open versus fenestrated repair.  [name], MD [facility] Office: [place]

## 2020-02-13 ENCOUNTER — Other Ambulatory Visit: Payer: Self-pay

## 2020-02-13 DIAGNOSIS — I714 Abdominal aortic aneurysm, without rupture, unspecified: Secondary | ICD-10-CM

## 2020-02-14 ENCOUNTER — Other Ambulatory Visit: Payer: Self-pay

## 2020-02-14 DIAGNOSIS — I714 Abdominal aortic aneurysm, without rupture, unspecified: Secondary | ICD-10-CM

## 2020-02-20 ENCOUNTER — Ambulatory Visit: Payer: PPO | Admitting: Cardiology

## 2020-02-20 ENCOUNTER — Encounter: Payer: Self-pay | Admitting: Cardiology

## 2020-02-20 ENCOUNTER — Other Ambulatory Visit: Payer: Self-pay

## 2020-02-20 ENCOUNTER — Encounter: Payer: Self-pay | Admitting: *Deleted

## 2020-02-20 VITALS — BP 126/80 | HR 73 | Ht 69.0 in | Wt 217.0 lb

## 2020-02-20 DIAGNOSIS — I10 Essential (primary) hypertension: Secondary | ICD-10-CM

## 2020-02-20 DIAGNOSIS — Z01818 Encounter for other preprocedural examination: Secondary | ICD-10-CM | POA: Diagnosis not present

## 2020-02-20 DIAGNOSIS — I4821 Permanent atrial fibrillation: Secondary | ICD-10-CM | POA: Diagnosis not present

## 2020-02-20 DIAGNOSIS — I714 Abdominal aortic aneurysm, without rupture, unspecified: Secondary | ICD-10-CM

## 2020-02-20 NOTE — Progress Notes (Signed)
Cardiology Office Note:    Date:  02/20/2020   ID:  [name], DOB 1941-05-29, MRN [id number]  PCP:  Celene Squibb, MD  [facility] Cardiologist:  Candee Furbish, MD  [facility] Electrophysiologist:  None   Referring MD: Celene Squibb, MD     History of Present Illness:    [name] is a 79 y.o. male here for preoperative surgical evaluation at the request of Dr. Nevada Crane, Dr Carlis Abbott with vascular surgery for rapidly enlarging abdominal aortic aneurysm.  Has permanent atrial fibrillation.  Watchman device (left atrial appendage occluding device)  Abdominal aortic aneurysm followed by vascular.  Dr. Carlis Abbott has been monitoring.  It has been enlarging.  Now 5.9 cm.  It was 4.6 cm in 2019.  Needs surgery.  Echocardiogram 2017 showed EF of 60%.  Mild aortic and mitral regurgitation.  Has had in the past gallbladder right-sided chest pain, colicky.  In review of vascular as noted, able to walk 2 laps around the track without stopping about a half a mile.  No chest pain or shortness of breath.  His wife accompanies him.  He rarely will have some epigastric discomfort at rest.  No prior MI.  Former smoker.  Social history: He and his wife are originally from the [place], [place] area. Their daughter lives in the [place], [place] area and Lansing lives in [place], [place].   Past Medical History:  Diagnosis Date  . AAA (abdominal aortic aneurysm) ([place])   . Aneurysm ([place])   . Arthritis   . Degenerative joint disease   . History of blood transfusion 12/2007   "bleeding for Warfarin"  . Hyperlipidemia   . Hypertension    Admitted for chest pain and 06/2011-neg stress Myoview,mod. LVH with nl EF on echo; TSH of 3.75  . Meniere's disease   . Nephrolithiasis   . Peripheral vascular disease ([place])    Small AAA (3.7-4cm 09/2010), small SMA aneurysm (17mm)  . Permanent atrial fibrillation ([place])    a. s/p Watchman  . Renal hemorrhage, right 2009   2009 while  anticoagulated with Coumadin; treated with the renal artery embolization    Past Surgical History:  Procedure Laterality Date  . BIOPSY  02/21/2017   Procedure: BIOPSY;  Surgeon: Danie Binder, MD;  [facility];  Service: Endoscopy;;  gastric  . COLONOSCOPY N/A 02/21/2017   multiple polyps, one requiring piecemeal removal with epi and clip placement, multiple small and large mouthed diverticula, recto-sigmoid and sigmoid with significant excess looping, internal hemorrhoids. Path with one advanced adenoma, 3 simple adenomas, 2 hyperplastic polyps. Survelilance 3 years   . EMBOLIZATION  12/2007   Left renal hematoma, status post angiogram and embolization/notes 09/29/2010  . ESOPHAGOGASTRODUODENOSCOPY N/A 02/21/2017   benign-appearing esophageal stenosis s/p dilation, gastritis, scarred mucosa in pylorus due to prior PUD. reactive gastropathy on path  . FRACTURE SURGERY    . LEFT ATRIAL APPENDAGE OCCLUSION N/A 03/18/2016   Procedure: LEFT ATRIAL APPENDAGE OCCLUSION;  Surgeon: [name], MD;  Location: [place] [facility];  Service: Cardiovascular;  Laterality: N/A;  . ORIF FEMUR FRACTURE Right 1960   "2 steel rods in leg"  . PILONIDAL CYST EXCISION    . POLYPECTOMY  02/21/2017   Procedure: POLYPECTOMY;  Surgeon: Danie Binder, MD;  [facility];  Service: Endoscopy;;  colon  . SAVORY DILATION N/A 02/21/2017   Procedure: SAVORY DILATION;  Surgeon: Danie Binder, MD;  [facility];  Service:  Endoscopy;  Laterality: N/A;  . TEE WITHOUT CARDIOVERSION N/A 03/10/2016   Procedure: TRANSESOPHAGEAL ECHOCARDIOGRAM (TEE);  Surgeon: Josue Hector, MD;  Location: [place] [facility];  Service: Cardiovascular;  Laterality: N/A;  . TEE WITHOUT CARDIOVERSION N/A 04/30/2016   Procedure: TRANSESOPHAGEAL ECHOCARDIOGRAM (TEE);  Surgeon: Josue Hector, MD;  Location: [place];  Service: Cardiovascular;  Laterality: N/A;  . Watchman procedure  02/2016    Current  Medications: Current Meds  Medication Sig  . acetaminophen (TYLENOL) 500 MG tablet Take 1,000 mg by mouth every 8 (eight) hours as needed (pain).  Marland Kitchen aspirin EC 325 MG tablet Take 325 mg by mouth daily.  . cetirizine (ZYRTEC) 10 MG tablet Take 10 mg by mouth at bedtime.   Marland Kitchen diltiazem (CARDIZEM CD) 240 MG 24 hr capsule Take 240 mg by mouth at bedtime.   . fluticasone (FLONASE) 50 MCG/ACT nasal spray Place 1 spray into both nostrils 2 (two) times daily.   Marland Kitchen LORazepam (ATIVAN) 0.5 MG tablet Take 0.5 mg by mouth every 8 (eight) hours.  . meclizine (ANTIVERT) 25 MG tablet Take 25 mg by mouth 2 (two) times daily. For dizziness  . metoprolol succinate (TOPROL-XL) 25 MG 24 hr tablet Take 0.5 tablets (12.5 mg total) by mouth daily.  . Multiple Vitamin (MULTIVITAMIN) tablet Take 1 tablet by mouth daily.  Marland Kitchen MYRBETRIQ 50 MG TB24 tablet Take 50 mg by mouth daily.  . pantoprazole (PROTONIX) 40 MG tablet TAKE 1 TABLET BY MOUTH DAILY 30 MINUTES PRIOR TO BREAKFAST.  . rosuvastatin (CRESTOR) 5 MG tablet Take 5 mg by mouth daily.  . tamsulosin (FLOMAX) 0.4 MG CAPS capsule Take 0.4 mg by mouth daily.  Marland Kitchen triamterene-hydrochlorothiazide (MAXZIDE-25) 37.5-25 MG per tablet Take 1 tablet by mouth daily.     Allergies:   Statins and Rivaroxaban   Social History   Socioeconomic History  . Marital status: Married    Spouse name: Not on file  . Number of children: 2  . Years of education: Not on file  . Highest education level: Not on file  Occupational History  . Occupation: Retired    Comment: Chief Technology Officer job  Tobacco Use  . Smoking status: Former Smoker    Packs/day: 1.00    Years: 40.00    Pack years: 40.00    Quit date: 01/20/2008    Years since quitting: 12.0  . Smokeless tobacco: Never Used  Substance and Sexual Activity  . Alcohol use: Not Currently    Alcohol/week: 1.0 standard drink    Types: 1 Standard drinks or equivalent per week    Comment: rare wine  . Drug use: No  .  Sexual activity: Yes  Other Topics Concern  . Not on file  Social History Narrative   Retired from Architect   Lives in [place] Strain:   . Difficulty of Paying Living Expenses: Not on file  Food Insecurity:   . Worried About Charity fundraiser in the Last Year: Not on file  . Ran Out of Food in the Last Year: Not on file  Transportation Needs:   . Lack of Transportation (Medical): Not on file  . Lack of Transportation (Non-Medical): Not on file  Physical Activity:   . Days of Exercise per Week: Not on file  . Minutes of Exercise per Session: Not on file  Stress:   . Feeling of Stress : Not on file  Social Connections:   . Frequency of  Communication with Friends and Family: Not on file  . Frequency of Social Gatherings with Friends and Family: Not on file  . Attends Religious Services: Not on file  . Active Member of Clubs or Organizations: Not on file  . Attends Archivist Meetings: Not on file  . Marital Status: Not on file     Family History: The patient's family history includes Atrial fibrillation in his sister; Colon cancer in his cousin; Hyperlipidemia in his mother; Hypertension in his mother; Renal cancer in his mother; Stroke in his father. There is no history of Colon polyps.  ROS:   Please see the history of present illness.    No syncope no bleeding.  All other systems reviewed and are negative.  EKGs/Labs/Other Studies Reviewed:    The following studies were reviewed today: 2013 - Myoview test.  EKG:  EKG is  ordered today.  The ekg ordered today demonstrates atrial fibrillation 73 T wave inversion noted in V3 through V6.  Recent Labs: No results found for requested labs within last 8760 hours.  Recent Lipid Panel    Component Value Date/Time   CHOL 161 12/08/2012 0705   TRIG 165 (H) 12/08/2012 0705   HDL 37 (L) 12/08/2012 0705   CHOLHDL 4.4 12/08/2012 0705   VLDL 33 12/08/2012  0705   LDLCALC 91 12/08/2012 0705    Physical Exam:    VS:  BP 126/80   Pulse 73   Ht 5\' 9"  (1.753 m)   Wt 217 lb (98.4 kg)   SpO2 95%   BMI 32.05 kg/m     Wt Readings from Last 3 Encounters:  02/20/20 217 lb (98.4 kg)  02/12/20 214 lb (97.1 kg)  06/26/19 209 lb (94.8 kg)     GEN:  Well nourished, well developed in no acute distress HEENT: Normal NECK: No JVD; No carotid bruits LYMPHATICS: No lymphadenopathy CARDIAC: irreg no murmurs, rubs, gallops RESPIRATORY:  Clear to auscultation without rales, wheezing or rhonchi  ABDOMEN: Soft, non-tender, protuberant MUSCULOSKELETAL:  Mild edema; No deformity  SKIN: Warm and dry NEUROLOGIC:  Alert and oriented x 3 PSYCHIATRIC:  Normal affect   ASSESSMENT:    No diagnosis found. PLAN:    In order of problems listed above:  Preoperative cardiovascular evaluation -We will go ahead and proceed with Lexiscan stress test and echocardiogram to ensure that he has proper structure and function and no evidence of high risk ischemia prior to abdominal aortic procedure.  He is willing to proceed.  Permanent atrial fibrillation -Stable.  Well-controlled with diltiazem and long-acting metoprolol. -On aspirin.  Watchman device.  No anticoagulation.  Essential hypertension -Controlled, medications reviewed, continue prescribing.  Abdominal aortic aneurysm -Now 5.9 cm from CT of the abdomen on 01/16/2020 obtain by urologist.  Quick increase.  Dr. Carlis Abbott would like to operate, stent graft preferentially.   Medication Adjustments/Labs and Tests Ordered: Current medicines are reviewed at length with the patient today.  Concerns regarding medicines are outlined above.  No orders of the defined types were placed in this encounter.  No orders of the defined types were placed in this encounter.   There are no Patient Instructions on file for this visit.   Signed, Candee Furbish, MD  02/20/2020 12:12 PM    [place]

## 2020-02-20 NOTE — Patient Instructions (Signed)
Medication Instructions:  The current medical regimen is effective;  continue present plan and medications.  *If you need a refill on your cardiac medications before your next appointment, please call your pharmacy*  Testing/Procedures: Your physician has requested that you have an echocardiogram. Echocardiography is a painless test that uses sound waves to create images of your heart. It provides your doctor with information about the size and shape of your heart and how well your hearts chambers and valves are working. This procedure takes approximately one hour. There are no restrictions for this procedure.  Your physician has requested that you have a lexiscan myoview. For further information please visit [email or web address]. Please follow instruction sheet, as given.  Follow-Up: At [facility], you and your health needs are our priority.  As part of our continuing mission to provide you with exceptional heart care, we have created designated Provider Care Teams.  These Care Teams include your primary Cardiologist (physician) and Advanced Practice Providers (APPs -  Physician Assistants and Nurse Practitioners) who all work together to provide you with the care you need, when you need it.  We recommend signing up for the patient portal called "MyChart".  Sign up information is provided on this After Visit Summary.  MyChart is used to connect with patients for Virtual Visits (Telemedicine).  Patients are able to view lab/test results, encounter notes, upcoming appointments, etc.  Non-urgent messages can be sent to your provider as well.   To learn more about what you can do with MyChart, go to [email or web address].    Your next appointment:   6 month(s)  The format for your next appointment:   In Person  Provider:   Candee Furbish, MD   Thank you for choosing [facility]!!

## 2020-02-27 ENCOUNTER — Telehealth: Payer: Self-pay | Admitting: Vascular Surgery

## 2020-02-27 NOTE — Telephone Encounter (Signed)
Called and discussed with [name] that after review of recent CT abdomen from urology office with device rep, I think he would be a good candidate for fenestrated endograft with two small fenestrations for renal arteries and SMA scallup with custom device given juxta-renal location of infrarenal AAA and not a candidate for traditional endograft.  He is interested in stent graft as opposed to open repair as dicussed in clinic.  Awaiting new CTA abdomen pelvis with thin slices to size and order custom endograft.  He is also undergoig further work-up with cardiology.  Discussed even after ordering custom stent graft may take 6-8 weeks at least for stent graft to be constructed.  Will see him after CTA in office to schedule surgery.  [name], MD [facility] [place] Office: [place]

## 2020-02-28 DIAGNOSIS — E782 Mixed hyperlipidemia: Secondary | ICD-10-CM | POA: Diagnosis not present

## 2020-02-28 DIAGNOSIS — I719 Aortic aneurysm of unspecified site, without rupture: Secondary | ICD-10-CM | POA: Diagnosis not present

## 2020-02-28 DIAGNOSIS — I10 Essential (primary) hypertension: Secondary | ICD-10-CM | POA: Diagnosis not present

## 2020-02-28 DIAGNOSIS — R7303 Prediabetes: Secondary | ICD-10-CM | POA: Diagnosis not present

## 2020-02-28 DIAGNOSIS — R7301 Impaired fasting glucose: Secondary | ICD-10-CM | POA: Diagnosis not present

## 2020-02-28 DIAGNOSIS — I482 Chronic atrial fibrillation, unspecified: Secondary | ICD-10-CM | POA: Diagnosis not present

## 2020-02-28 DIAGNOSIS — K219 Gastro-esophageal reflux disease without esophagitis: Secondary | ICD-10-CM | POA: Diagnosis not present

## 2020-03-05 ENCOUNTER — Encounter: Payer: Self-pay | Admitting: Internal Medicine

## 2020-03-05 ENCOUNTER — Telehealth (HOSPITAL_COMMUNITY): Payer: Self-pay | Admitting: *Deleted

## 2020-03-05 NOTE — Telephone Encounter (Signed)
Patient given detailed instructions per Myocardial Perfusion Study Information Sheet for the test on 03/10/20 at 8:15. Patient notified to arrive 15 minutes early and that it is imperative to arrive on time for appointment to keep from having the test rescheduled.  If you need to cancel or reschedule your appointment, please call the office within 24 hours of your appointment. . Patient verbalized understanding.[name]

## 2020-03-10 ENCOUNTER — Ambulatory Visit (HOSPITAL_COMMUNITY): Payer: PPO | Attending: Cardiology

## 2020-03-10 ENCOUNTER — Ambulatory Visit (HOSPITAL_BASED_OUTPATIENT_CLINIC_OR_DEPARTMENT_OTHER): Payer: PPO

## 2020-03-10 ENCOUNTER — Other Ambulatory Visit: Payer: Self-pay

## 2020-03-10 DIAGNOSIS — I10 Essential (primary) hypertension: Secondary | ICD-10-CM

## 2020-03-10 DIAGNOSIS — Z01818 Encounter for other preprocedural examination: Secondary | ICD-10-CM | POA: Diagnosis not present

## 2020-03-10 DIAGNOSIS — I4821 Permanent atrial fibrillation: Secondary | ICD-10-CM

## 2020-03-10 DIAGNOSIS — I714 Abdominal aortic aneurysm, without rupture, unspecified: Secondary | ICD-10-CM

## 2020-03-10 DIAGNOSIS — Z01810 Encounter for preprocedural cardiovascular examination: Secondary | ICD-10-CM | POA: Diagnosis not present

## 2020-03-10 LAB — MYOCARDIAL PERFUSION IMAGING
LV dias vol: 98 mL (ref 62–150)
LV sys vol: 37 mL
Peak HR: 79 {beats}/min
Rest HR: 59 {beats}/min
SDS: 2
SRS: 3
SSS: 5
TID: 1.09

## 2020-03-10 LAB — ECHOCARDIOGRAM COMPLETE
Area-P 1/2: 3.24 cm2
P 1/2 time: 1315 msec
S' Lateral: 2.5 cm

## 2020-03-10 MED ORDER — REGADENOSON 0.4 MG/5ML IV SOLN
0.4000 mg | Freq: Once | INTRAVENOUS | Status: AC
  Administered 2020-03-10: 0.4 mg via INTRAVENOUS

## 2020-03-10 MED ORDER — TECHNETIUM TC 99M TETROFOSMIN IV KIT
11.0000 | PACK | Freq: Once | INTRAVENOUS | Status: AC | PRN
  Administered 2020-03-10: 11 via INTRAVENOUS
  Filled 2020-03-10: qty 11

## 2020-03-10 MED ORDER — TECHNETIUM TC 99M TETROFOSMIN IV KIT
31.3000 | PACK | Freq: Once | INTRAVENOUS | Status: AC | PRN
  Administered 2020-03-10: 31.3 via INTRAVENOUS
  Filled 2020-03-10: qty 32

## 2020-03-10 MED ORDER — PERFLUTREN LIPID MICROSPHERE
1.0000 mL | INTRAVENOUS | Status: AC | PRN
  Administered 2020-03-10: 3 mL via INTRAVENOUS

## 2020-03-11 DIAGNOSIS — I719 Aortic aneurysm of unspecified site, without rupture: Secondary | ICD-10-CM | POA: Diagnosis not present

## 2020-03-11 DIAGNOSIS — R7301 Impaired fasting glucose: Secondary | ICD-10-CM | POA: Diagnosis not present

## 2020-03-11 DIAGNOSIS — K219 Gastro-esophageal reflux disease without esophagitis: Secondary | ICD-10-CM | POA: Diagnosis not present

## 2020-03-11 DIAGNOSIS — I10 Essential (primary) hypertension: Secondary | ICD-10-CM | POA: Diagnosis not present

## 2020-03-11 DIAGNOSIS — E782 Mixed hyperlipidemia: Secondary | ICD-10-CM | POA: Diagnosis not present

## 2020-03-11 DIAGNOSIS — I482 Chronic atrial fibrillation, unspecified: Secondary | ICD-10-CM | POA: Diagnosis not present

## 2020-03-11 DIAGNOSIS — R7303 Prediabetes: Secondary | ICD-10-CM | POA: Diagnosis not present

## 2020-03-12 ENCOUNTER — Ambulatory Visit
Admission: RE | Admit: 2020-03-12 | Discharge: 2020-03-12 | Disposition: A | Discharge status: Home / Self Care | Payer: PPO | Source: Ambulatory Visit | Attending: Vascular Surgery | Admitting: Vascular Surgery

## 2020-03-12 DIAGNOSIS — I714 Abdominal aortic aneurysm, without rupture, unspecified: Secondary | ICD-10-CM

## 2020-03-12 MED ORDER — IOPAMIDOL (ISOVUE-370) INJECTION 76%
75.0000 mL | Freq: Once | INTRAVENOUS | Status: AC | PRN
  Administered 2020-03-12: 75 mL via INTRAVENOUS

## 2020-03-13 ENCOUNTER — Telehealth: Payer: Self-pay

## 2020-03-13 NOTE — Telephone Encounter (Signed)
Call report taken from [facility] at [facility] regarding pt's CT angio abd/pelvis. I have sent MD a message to let him know.

## 2020-03-18 ENCOUNTER — Telehealth: Payer: Self-pay

## 2020-03-18 ENCOUNTER — Encounter: Payer: Self-pay | Admitting: Vascular Surgery

## 2020-03-18 ENCOUNTER — Ambulatory Visit: Payer: PPO | Admitting: Vascular Surgery

## 2020-03-18 ENCOUNTER — Other Ambulatory Visit: Payer: Self-pay

## 2020-03-18 VITALS — BP 119/79 | HR 66 | Temp 97.6°F | Resp 18 | Ht 69.0 in | Wt 215.0 lb

## 2020-03-18 DIAGNOSIS — I714 Abdominal aortic aneurysm, without rupture, unspecified: Secondary | ICD-10-CM

## 2020-03-18 NOTE — Progress Notes (Signed)
Patient name: [name] MRN: [id number] DOB: [birth year] Sex: male  REASON FOR VISIT: F/U to discuss enlarging AAA after updated CTA  HPI: [name] is a 79 y.o. male with history of atrial fibrillation status post watchman now off anticoagulation, hypertension, hyperlipidemia that presents to discuss possible fenestrated endograft for his juxtra-renal AAA after recent CTA.    I was recently contacted by his urologist after he had a CT abdomen for surveillance of a renal mass that showed enlarging AAA.  In review of previous growth the aneurysm was 4.6 on 07/29/2017 enlarged to 5.1 and then 5.19 approximalte  6 months ago.  I reviewed the CT abdomen from 01/16/2020 obtained by his urologist that shows now 5.9 cm abdominal aortic aneurysm.  Given juxtarenal location of aneurysm we previously discussed open repair versus fenestrated endograft.  Ultimately sent him to cardiology who have cleared him after both an echo and lexicon stress test.  Ultimately he also had an updated CT scan for complete evaluation.  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  Past Medical History:  Diagnosis Date  . AAA (abdominal aortic aneurysm) ([place])   . Aneurysm ([place])   . Arthritis   . Degenerative joint disease   . History of blood transfusion 12/2007   "bleeding for Warfarin"  . Hyperlipidemia   . Hypertension    Admitted for chest pain and 06/2011-neg stress Myoview,mod. LVH with nl EF on echo; TSH of 3.75  . Meniere's disease   . Nephrolithiasis   . Peripheral vascular disease ([place])    Small AAA (3.7-4cm 09/2010), small SMA aneurysm (67mm)  . Permanent atrial fibrillation ([place])    a. s/p Watchman  . Renal hemorrhage, right 2009   2009 while anticoagulated with Coumadin; treated with the renal artery embolization    Past  Surgical History:  Procedure Laterality Date  . BIOPSY  02/21/2017   Procedure: BIOPSY;  Surgeon: Danie Binder, MD;  [facility];  Service: Endoscopy;;  gastric  . COLONOSCOPY N/A 02/21/2017   multiple polyps, one requiring piecemeal removal with epi and clip placement, multiple small and large mouthed diverticula, recto-sigmoid and sigmoid with significant excess looping, internal hemorrhoids. Path with one advanced adenoma, 3 simple adenomas, 2 hyperplastic polyps. Survelilance 3 years   . EMBOLIZATION  12/2007   Left renal hematoma, status post angiogram and embolization/notes 09/29/2010  . ESOPHAGOGASTRODUODENOSCOPY N/A 02/21/2017   benign-appearing esophageal stenosis s/p dilation, gastritis, scarred mucosa in pylorus due to prior PUD. reactive gastropathy on path  . FRACTURE SURGERY    . LEFT ATRIAL APPENDAGE OCCLUSION N/A 03/18/2016   Procedure: LEFT ATRIAL APPENDAGE OCCLUSION;  Surgeon: [name], MD;  Location: [place] [facility];  Service: Cardiovascular;  Laterality: N/A;  . ORIF FEMUR FRACTURE Right 1960   "2 steel rods in leg"  . PILONIDAL CYST EXCISION    . POLYPECTOMY  02/21/2017   Procedure: POLYPECTOMY;  Surgeon: Danie Binder, MD;  [facility];  Service: Endoscopy;;  colon  . SAVORY DILATION N/A 02/21/2017   Procedure: SAVORY DILATION;  Surgeon: Danie Binder, MD;  [facility];  Service: Endoscopy;  Laterality: N/A;  . TEE WITHOUT CARDIOVERSION N/A 03/10/2016   Procedure: TRANSESOPHAGEAL ECHOCARDIOGRAM (TEE);  Surgeon: Josue Hector, MD;  Location: [place];  Service: Cardiovascular;  Laterality: N/A;  . TEE WITHOUT CARDIOVERSION N/A 04/30/2016   Procedure: TRANSESOPHAGEAL ECHOCARDIOGRAM (TEE);  Surgeon: Josue Hector, MD;  Location: [place];  Service: Cardiovascular;  Laterality: N/A;  . Watchman procedure  02/2016    Family History  Problem Relation Age of Onset  . Hypertension Mother   . Hyperlipidemia Mother   .  Renal cancer Mother   . Atrial fibrillation Sister   . Stroke Father   . Colon cancer Cousin   . Colon polyps Neg Hx     SOCIAL HISTORY: Social History   Tobacco Use  . Smoking status: Former Smoker    Packs/day: 1.00    Years: 40.00    Pack years: 40.00    Quit date: 01/20/2008    Years since quitting: 12.1  . Smokeless tobacco: Never Used  Substance Use Topics  . Alcohol use: Not Currently    Alcohol/week: 1.0 standard drink    Types: 1 Standard drinks or equivalent per week    Comment: rare wine    Allergies  Allergen Reactions  . Statins Other (See Comments)    Muscle aches Muscle aches  . Rivaroxaban Other (See Comments)    Developed redness in both legs Developed redness in both legs    Current Outpatient Medications  Medication Sig Dispense Refill  . acetaminophen (TYLENOL) 500 MG tablet Take 1,000 mg by mouth every 8 (eight) hours as needed (pain).    Marland Kitchen aspirin EC 325 MG tablet Take 325 mg by mouth daily.    . cetirizine (ZYRTEC) 10 MG tablet Take 10 mg by mouth at bedtime.     Marland Kitchen diltiazem (CARDIZEM CD) 240 MG 24 hr capsule Take 240 mg by mouth at bedtime.     . fluticasone (FLONASE) 50 MCG/ACT nasal spray Place 1 spray into both nostrils 2 (two) times daily.     Marland Kitchen LORazepam (ATIVAN) 0.5 MG tablet Take 0.5 mg by mouth at bedtime.     . meclizine (ANTIVERT) 25 MG tablet Take 25 mg by mouth 2 (two) times daily. For dizziness    . metoprolol succinate (TOPROL-XL) 25 MG 24 hr tablet Take 0.5 tablets (12.5 mg total) by mouth daily.    . Multiple Vitamin (MULTIVITAMIN) tablet Take 1 tablet by mouth daily.    Marland Kitchen MYRBETRIQ 50 MG TB24 tablet Take 50 mg by mouth daily.    . pantoprazole (PROTONIX) 40 MG tablet TAKE 1 TABLET BY MOUTH DAILY 30 MINUTES PRIOR TO BREAKFAST. 30 tablet 3  . rosuvastatin (CRESTOR) 5 MG tablet Take 5 mg by mouth daily.    . tamsulosin (FLOMAX) 0.4 MG CAPS capsule Take 0.4 mg by mouth daily.     Marland Kitchen triamterene-hydrochlorothiazide (MAXZIDE-25)  37.5-25 MG per tablet Take 1 tablet by mouth daily.     No current facility-administered medications for this visit.    REVIEW OF SYSTEMS:  [X]  denotes positive finding, [ ]  denotes negative finding Cardiac  Comments:  Chest pain or chest pressure:    Shortness of breath upon exertion:    Short of breath when lying flat:    Irregular heart rhythm:        Vascular    Pain in calf, thigh, or hip brought on by ambulation:    Pain in feet at night that wakes you up from your sleep:     Blood clot in your veins:    Leg swelling:         Pulmonary    Oxygen at  home:    Productive cough:     Wheezing:         Neurologic    Sudden weakness in arms or legs:     Sudden numbness in arms or legs:     Sudden onset of difficulty speaking or slurred speech:    Temporary loss of vision in one eye:     Problems with dizziness:         Gastrointestinal    Blood in stool:     Vomited blood:         Genitourinary    Burning when urinating:     Blood in urine:        Psychiatric    Major depression:         Hematologic    Bleeding problems:    Problems with blood clotting too easily:        Skin    Rashes or ulcers:        Constitutional    Fever or chills:      PHYSICAL EXAM: Vitals:   03/18/20 1326  BP: 119/79  Pulse: 66  Resp: 18  Temp: 97.6 F (36.4 C)  TempSrc: Temporal  SpO2: 96%  Weight: 215 lb (97.5 kg)  Height: 5\' 9"  (1.753 m)    GENERAL: The patient is a well-nourished male, in no acute distress. The vital signs are documented above. CARDIAC: There is a regular rate and rhythm.  VASCULAR:  2+ femoral pulse palpable bilateral groins 2+ PT palpable bilateral lower extremities PULMONARY: There is good air exchange bilaterally without wheezing or rales. ABDOMEN: Soft and non-tender with normal pitched bowel sounds. No pain with palpation of aneurysm. MUSCULOSKELETAL: There are no major deformities or cyanosis. NEUROLOGIC: No focal weakness or paresthesias  are detected.   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  Assessment/Plan:  11-year male presents to discuss enlarging abdominal aortic aneurysm noted on recent CT in August for surveillance of a renal mass.  His juxtarenal aneurysm now measures greater than 6 cm.  We previously discussed options for proceeding with repair including open repair versus fenestrated endograft.  He has elected to proceed with fenestrated endograft.  He has been cleared by cardiology and I appreciate Dr. Marlou Porch assistance after echocardiogram and nuclear stress test.  We discussed that I have already reviewed his imaging with Lacinda Axon and he is a good candidate and now with the updated CT scan we can build his endograft.  This will likely take up to 6 weeks.  I will schedule him in mid December and have my partner Dr. Donzetta Matters assist with surgery.  We discussed risk and benefits including risk of vessel injury, renal insufficiency possibly requiring dialysis, risk of anesthesia, stent endoleak, risk of heart attack stroke, bleeding, infection, even death.  He understands that the current size aneurysm has about a 15% yearly rupture risk.  Discussed that the endograft will be 2 small fenestrations and an SMA scallop and this will require stenting of his renal arteries to get proximal seal.  [name], MD [facility] [place] Office: [place]

## 2020-03-18 NOTE — Telephone Encounter (Signed)
Pt wants to know what his portion of the [place] surgery on 12/09 will cost him.  I called and spoke to a Rep for his [employer] insurance.  They said as soon as we get an approval, the patient can call Benefits and Eligiblity at [phone] using option 1.  They can also speak to [facility] Concierge at [phone].  Pt's wife verbalized understanding.  Thurston Hole., LPN

## 2020-03-28 DIAGNOSIS — K3189 Other diseases of stomach and duodenum: Secondary | ICD-10-CM | POA: Diagnosis not present

## 2020-04-18 DIAGNOSIS — E782 Mixed hyperlipidemia: Secondary | ICD-10-CM | POA: Diagnosis not present

## 2020-04-18 DIAGNOSIS — Q120 Congenital cataract: Secondary | ICD-10-CM | POA: Diagnosis not present

## 2020-04-18 DIAGNOSIS — D181 Lymphangioma, any site: Secondary | ICD-10-CM | POA: Diagnosis not present

## 2020-04-18 DIAGNOSIS — K829 Disease of gallbladder, unspecified: Secondary | ICD-10-CM | POA: Diagnosis not present

## 2020-04-18 DIAGNOSIS — I712 Thoracic aortic aneurysm, without rupture: Secondary | ICD-10-CM | POA: Diagnosis not present

## 2020-04-18 DIAGNOSIS — R7303 Prediabetes: Secondary | ICD-10-CM | POA: Diagnosis not present

## 2020-04-18 DIAGNOSIS — I719 Aortic aneurysm of unspecified site, without rupture: Secondary | ICD-10-CM | POA: Diagnosis not present

## 2020-04-18 DIAGNOSIS — I10 Essential (primary) hypertension: Secondary | ICD-10-CM | POA: Diagnosis not present

## 2020-04-18 DIAGNOSIS — R7301 Impaired fasting glucose: Secondary | ICD-10-CM | POA: Diagnosis not present

## 2020-04-18 DIAGNOSIS — I482 Chronic atrial fibrillation, unspecified: Secondary | ICD-10-CM | POA: Diagnosis not present

## 2020-04-18 DIAGNOSIS — R21 Rash and other nonspecific skin eruption: Secondary | ICD-10-CM | POA: Diagnosis not present

## 2020-04-18 DIAGNOSIS — I4821 Permanent atrial fibrillation: Secondary | ICD-10-CM | POA: Diagnosis not present

## 2020-04-22 ENCOUNTER — Other Ambulatory Visit: Payer: Self-pay

## 2020-04-22 DIAGNOSIS — I714 Abdominal aortic aneurysm, without rupture, unspecified: Secondary | ICD-10-CM

## 2020-04-22 DIAGNOSIS — R7303 Prediabetes: Secondary | ICD-10-CM | POA: Diagnosis not present

## 2020-04-22 DIAGNOSIS — N401 Enlarged prostate with lower urinary tract symptoms: Secondary | ICD-10-CM | POA: Diagnosis not present

## 2020-04-22 DIAGNOSIS — D4111 Neoplasm of uncertain behavior of right renal pelvis: Secondary | ICD-10-CM | POA: Diagnosis not present

## 2020-04-22 DIAGNOSIS — R7301 Impaired fasting glucose: Secondary | ICD-10-CM | POA: Diagnosis not present

## 2020-04-22 DIAGNOSIS — R519 Headache, unspecified: Secondary | ICD-10-CM | POA: Diagnosis not present

## 2020-04-22 DIAGNOSIS — I10 Essential (primary) hypertension: Secondary | ICD-10-CM | POA: Diagnosis not present

## 2020-04-22 DIAGNOSIS — R944 Abnormal results of kidney function studies: Secondary | ICD-10-CM | POA: Diagnosis not present

## 2020-04-22 DIAGNOSIS — E782 Mixed hyperlipidemia: Secondary | ICD-10-CM | POA: Diagnosis not present

## 2020-04-22 DIAGNOSIS — I4821 Permanent atrial fibrillation: Secondary | ICD-10-CM | POA: Diagnosis not present

## 2020-04-28 ENCOUNTER — Encounter: Payer: Self-pay | Admitting: Gastroenterology

## 2020-04-30 ENCOUNTER — Ambulatory Visit: Payer: PPO | Admitting: Student

## 2020-05-05 ENCOUNTER — Encounter (HOSPITAL_COMMUNITY)
Admission: RE | Admit: 2020-05-05 | Discharge: 2020-05-05 | Disposition: A | Discharge status: Home / Self Care | Payer: PPO | Source: Ambulatory Visit | Attending: Vascular Surgery | Admitting: Vascular Surgery

## 2020-05-05 ENCOUNTER — Encounter (HOSPITAL_COMMUNITY): Payer: Self-pay

## 2020-05-05 ENCOUNTER — Other Ambulatory Visit: Payer: Self-pay

## 2020-05-05 ENCOUNTER — Other Ambulatory Visit (HOSPITAL_COMMUNITY)
Admission: RE | Admit: 2020-05-05 | Discharge: 2020-05-05 | Disposition: A | Discharge status: Home / Self Care | Payer: PPO | Source: Ambulatory Visit | Attending: Vascular Surgery | Admitting: Vascular Surgery

## 2020-05-05 DIAGNOSIS — I728 Aneurysm of other specified arteries: Secondary | ICD-10-CM | POA: Diagnosis present

## 2020-05-05 DIAGNOSIS — Z8249 Family history of ischemic heart disease and other diseases of the circulatory system: Secondary | ICD-10-CM | POA: Diagnosis not present

## 2020-05-05 DIAGNOSIS — Z20822 Contact with and (suspected) exposure to covid-19: Secondary | ICD-10-CM | POA: Diagnosis present

## 2020-05-05 DIAGNOSIS — Z823 Family history of stroke: Secondary | ICD-10-CM | POA: Diagnosis not present

## 2020-05-05 DIAGNOSIS — Z83438 Family history of other disorder of lipoprotein metabolism and other lipidemia: Secondary | ICD-10-CM | POA: Diagnosis not present

## 2020-05-05 DIAGNOSIS — Z7982 Long term (current) use of aspirin: Secondary | ICD-10-CM | POA: Diagnosis not present

## 2020-05-05 DIAGNOSIS — Z87442 Personal history of urinary calculi: Secondary | ICD-10-CM | POA: Diagnosis not present

## 2020-05-05 DIAGNOSIS — I714 Abdominal aortic aneurysm, without rupture: Secondary | ICD-10-CM | POA: Diagnosis present

## 2020-05-05 DIAGNOSIS — E871 Hypo-osmolality and hyponatremia: Secondary | ICD-10-CM | POA: Diagnosis not present

## 2020-05-05 DIAGNOSIS — I739 Peripheral vascular disease, unspecified: Secondary | ICD-10-CM | POA: Diagnosis present

## 2020-05-05 DIAGNOSIS — Z8051 Family history of malignant neoplasm of kidney: Secondary | ICD-10-CM | POA: Diagnosis not present

## 2020-05-05 DIAGNOSIS — Z7901 Long term (current) use of anticoagulants: Secondary | ICD-10-CM | POA: Diagnosis not present

## 2020-05-05 DIAGNOSIS — Z01812 Encounter for preprocedural laboratory examination: Secondary | ICD-10-CM | POA: Insufficient documentation

## 2020-05-05 DIAGNOSIS — I4821 Permanent atrial fibrillation: Secondary | ICD-10-CM | POA: Diagnosis present

## 2020-05-05 DIAGNOSIS — E785 Hyperlipidemia, unspecified: Secondary | ICD-10-CM | POA: Diagnosis present

## 2020-05-05 DIAGNOSIS — Z87891 Personal history of nicotine dependence: Secondary | ICD-10-CM | POA: Diagnosis not present

## 2020-05-05 DIAGNOSIS — Z800 Family history of malignant neoplasm of digestive organs: Secondary | ICD-10-CM | POA: Diagnosis not present

## 2020-05-05 DIAGNOSIS — Z79899 Other long term (current) drug therapy: Secondary | ICD-10-CM | POA: Diagnosis not present

## 2020-05-05 DIAGNOSIS — Z452 Encounter for adjustment and management of vascular access device: Secondary | ICD-10-CM | POA: Diagnosis not present

## 2020-05-05 DIAGNOSIS — Z888 Allergy status to other drugs, medicaments and biological substances status: Secondary | ICD-10-CM | POA: Diagnosis not present

## 2020-05-05 DIAGNOSIS — I482 Chronic atrial fibrillation, unspecified: Secondary | ICD-10-CM | POA: Diagnosis not present

## 2020-05-05 DIAGNOSIS — I10 Essential (primary) hypertension: Secondary | ICD-10-CM | POA: Diagnosis present

## 2020-05-05 DIAGNOSIS — J9811 Atelectasis: Secondary | ICD-10-CM | POA: Diagnosis not present

## 2020-05-05 HISTORY — DX: Chronic obstructive pulmonary disease, unspecified: J44.9

## 2020-05-05 LAB — URINALYSIS, ROUTINE W REFLEX MICROSCOPIC
Bilirubin Urine: NEGATIVE
Glucose, UA: NEGATIVE mg/dL
Hgb urine dipstick: NEGATIVE
Ketones, ur: NEGATIVE mg/dL
Leukocytes,Ua: NEGATIVE
Nitrite: NEGATIVE
Protein, ur: NEGATIVE mg/dL
Specific Gravity, Urine: 1.014 (ref 1.005–1.030)
pH: 7 (ref 5.0–8.0)

## 2020-05-05 LAB — COMPREHENSIVE METABOLIC PANEL
ALT: 18 U/L (ref 0–44)
AST: 28 U/L (ref 15–41)
Albumin: 3.8 g/dL (ref 3.5–5.0)
Alkaline Phosphatase: 32 U/L — ABNORMAL LOW (ref 38–126)
Anion gap: 9 (ref 5–15)
BUN: 17 mg/dL (ref 8–23)
CO2: 28 mmol/L (ref 22–32)
Calcium: 9.3 mg/dL (ref 8.9–10.3)
Chloride: 99 mmol/L (ref 98–111)
Creatinine, Ser: 1.2 mg/dL (ref 0.61–1.24)
GFR, Estimated: >60 mL/min (ref >60)
Glucose, Bld: 129 mg/dL — ABNORMAL HIGH (ref 70–99)
Potassium: 3.6 mmol/L (ref 3.5–5.1)
Sodium: 136 mmol/L (ref 135–145)
Total Bilirubin: 1.3 mg/dL — ABNORMAL HIGH (ref 0.3–1.2)
Total Protein: 7 g/dL (ref 6.5–8.1)

## 2020-05-05 LAB — SARS CORONAVIRUS 2 (TAT 6-24 HRS): SARS Coronavirus 2: NEGATIVE

## 2020-05-05 LAB — PROTIME-INR
INR: 1.2 (ref 0.8–1.2)
Prothrombin Time: 14.3 seconds (ref 11.4–15.2)

## 2020-05-05 LAB — CBC
HCT: 42.2 % (ref 39.0–52.0)
Hemoglobin: 14 g/dL (ref 13.0–17.0)
MCH: 30.1 pg (ref 26.0–34.0)
MCHC: 33.2 g/dL (ref 30.0–36.0)
MCV: 90.8 fL (ref 80.0–100.0)
Platelets: 164 10*3/uL (ref 150–400)
RBC: 4.65 MIL/uL (ref 4.22–5.81)
RDW: 13.7 % (ref 11.5–15.5)
WBC: 5.2 10*3/uL (ref 4.0–10.5)
nRBC: 0 % (ref 0.0–0.2)

## 2020-05-05 LAB — SURGICAL PCR SCREEN
MRSA, PCR: NEGATIVE
Staphylococcus aureus: NEGATIVE

## 2020-05-05 LAB — APTT: aPTT: 34 seconds (ref 24–36)

## 2020-05-05 NOTE — Progress Notes (Signed)
PCP - Dr. Allyn Kenner Cardiologist - Dr. Candee Furbish  Chest x-ray - n/a EKG - 02/20/20 Stress Test - 03/10/20 ECHO - 03/10/20 Cardiac Cath - denies  Sleep Study - negative study in the past. CPAP - denies  Blood Thinner Instructions: n/a Aspirin Instructions: n/a  ERAS Protcol - no PRE-SURGERY Ensure or G2- n/a  COVID TEST- 05/05/20  Anesthesia review: No  Patient denies shortness of breath, fever, cough and chest pain at PAT appointment   All instructions explained to the patient, with a verbal understanding of the material. Patient agrees to go over the instructions while at home for a better understanding. Patient also instructed to self quarantine after being tested for COVID-19. The opportunity to ask questions was provided.   Coronavirus Screening  Have you experienced the following symptoms:  Cough yes/no: No Fever (>100.32F)  yes/no: No Runny nose yes/no: No Sore throat yes/no: No Difficulty breathing/shortness of breath  yes/no: No  Have you or a family member traveled in the last 14 days and where? yes/no: No   If the patient indicates "YES" to the above questions, their PAT will be rescheduled to limit the exposure to others and, the surgeon will be notified. THE PATIENT WILL NEED TO BE ASYMPTOMATIC FOR 14 DAYS.   If the patient is not experiencing any of these symptoms, the PAT nurse will instruct them to NOT bring anyone with them to their appointment since they may have these symptoms or traveled as well.   Please remind your patients and families that hospital visitation restrictions are in effect and the importance of the restrictions.

## 2020-05-05 NOTE — Pre-Procedure Instructions (Signed)
[place], [place] [address] Phone: [phone] Fax: [phone]      Your procedure is scheduled on Thursday, December 9th at 7:30a.m.  Report to [facility] Main Entrance "A" at 5:30 A.M., and check in at the Admitting office.  Call this number if you have problems the morning of surgery:  [phone]  Call [phone] if you have any questions prior to your surgery date Monday-Friday 8am-4pm    Remember:  Do not eat or drink after midnight the night before your surgery    Take these medicines the morning of surgery with A SIP OF WATER  fluticasone (FLONASE) metoprolol succinate (TOPROL-XL)  pantoprazole (PROTONIX) tamsulosin (FLOMAX)  aspirin EC   acetaminophen (TYLENOL)-as needed meclizine (ANTIVERT)-as needed  As of today, STOP taking any Aleve, Naproxen, Ibuprofen, Motrin, Advil, Goody's, BC's, all herbal medications, fish oil, and all vitamins.                     Do not wear jewelry            Do not wear lotions, powders, colognes, or deodorant.            Men may shave face and neck.            Do not bring valuables to the hospital.            [facility] is not responsible for any belongings or valuables.  Do NOT Smoke (Tobacco/Vaping) or drink Alcohol 24 hours prior to your procedure If you use a CPAP at night, you may bring all equipment for your overnight stay.   Contacts, glasses, dentures or bridgework may not be worn into surgery.      For patients admitted to the hospital, discharge time will be determined by your treatment team.   Patients discharged the day of surgery will not be allowed to drive home, and someone needs to stay with them for 24 hours.    Special instructions:   [facility]- Preparing For Surgery  Before surgery, you can play an important role. Because skin is not sterile, your skin needs to be as free of germs as possible. You can reduce the number of germs on  your skin by washing with CHG (chlorahexidine gluconate) Soap before surgery.  CHG is an antiseptic cleaner which kills germs and bonds with the skin to continue killing germs even after washing.    Oral Hygiene is also important to reduce your risk of infection.  Remember - BRUSH YOUR TEETH THE MORNING OF SURGERY WITH YOUR REGULAR TOOTHPASTE  Please do not use if you have an allergy to CHG or antibacterial soaps. If your skin becomes reddened/irritated stop using the CHG.  Do not shave (including legs and underarms) for at least 48 hours prior to first CHG shower. It is OK to shave your face.  Please follow these instructions carefully.   1. Shower the NIGHT BEFORE SURGERY and the MORNING OF SURGERY with CHG Soap.   2. If you chose to wash your hair, wash your hair first as usual with your normal shampoo.  3. After you shampoo, rinse your hair and body thoroughly to remove the shampoo.  4. Use CHG as you would any other liquid soap. You can apply CHG directly to the skin and wash gently with a scrungie or a clean washcloth.   5. Apply the CHG Soap to your body ONLY FROM THE  NECK DOWN.  Do not use on open wounds or open sores. Avoid contact with your eyes, ears, mouth and genitals (private parts). Wash Face and genitals (private parts)  with your normal soap.   6. Wash thoroughly, paying special attention to the area where your surgery will be performed.  7. Thoroughly rinse your body with warm water from the neck down.  8. DO NOT shower/wash with your normal soap after using and rinsing off the CHG Soap.  9. Pat yourself dry with a CLEAN TOWEL.  10. Wear CLEAN PAJAMAS to bed the night before surgery  11. Place CLEAN SHEETS on your bed the night of your first shower and DO NOT SLEEP WITH PETS.   Day of Surgery: Wear Clean/Comfortable clothing the morning of surgery Do not apply any deodorants/lotions.   Remember to brush your teeth WITH YOUR REGULAR TOOTHPASTE.   Please read over  the following fact sheets that you were given.

## 2020-05-06 ENCOUNTER — Ambulatory Visit: Payer: PPO | Admitting: Student

## 2020-05-07 NOTE — Anesthesia Preprocedure Evaluation (Addendum)
Anesthesia Evaluation  Patient identified by MRN, date of birth, ID band Patient awake    Reviewed: Allergy & Precautions, NPO status , Patient's Chart, lab work & pertinent test results, reviewed documented beta blocker date and time   Airway Mallampati: II  TM Distance: >3 FB Neck ROM: Full    Dental  (+) Missing   Pulmonary COPD, former smoker,    Pulmonary exam normal breath sounds clear to auscultation       Cardiovascular hypertension, Pt. on home beta blockers and Pt. on medications + Peripheral Vascular Disease (ABDOMINAL AORTIC ANEURYSM)  + dysrhythmias (s/p Watchman) Atrial Fibrillation  Rhythm:Irregular Rate:Abnormal     Neuro/Psych negative neurological ROS     GI/Hepatic Neg liver ROS, GERD  Medicated,  Endo/Other  Obesity   Renal/GU Renal disease     Musculoskeletal  (+) Arthritis ,   Abdominal   Peds  Hematology negative hematology ROS (+)   Anesthesia Other Findings   Reproductive/Obstetrics                           Anesthesia Physical Anesthesia Plan  ASA: III  Anesthesia Plan: General   Post-op Pain Management:    Induction: Intravenous  PONV Risk Score and Plan: 2 and Dexamethasone and Ondansetron  Airway Management Planned: Oral ETT  Additional Equipment: Arterial line  Intra-op Plan:   Post-operative Plan: Extubation in OR  Informed Consent: I have reviewed the patients History and Physical, chart, labs and discussed the procedure including the risks, benefits and alternatives for the proposed anesthesia with the patient or authorized representative who has indicated his/her understanding and acceptance.       Plan Discussed with: CRNA  Anesthesia Plan Comments: (2nd large bore PIV)       Anesthesia Quick Evaluation

## 2020-05-08 ENCOUNTER — Encounter (HOSPITAL_COMMUNITY)
Admission: RE | Disposition: A | Discharge status: Home / Self Care | Payer: Self-pay | Source: Home / Self Care | Attending: Vascular Surgery

## 2020-05-08 ENCOUNTER — Inpatient Hospital Stay (HOSPITAL_COMMUNITY): Payer: PPO | Admitting: Anesthesiology

## 2020-05-08 ENCOUNTER — Other Ambulatory Visit: Payer: Self-pay

## 2020-05-08 ENCOUNTER — Encounter (HOSPITAL_COMMUNITY): Payer: Self-pay | Admitting: Vascular Surgery

## 2020-05-08 ENCOUNTER — Inpatient Hospital Stay (HOSPITAL_COMMUNITY)
Admission: RE | Admit: 2020-05-08 | Discharge: 2020-05-09 | DRG: 269 | Disposition: A | Discharge status: Home / Self Care | Payer: PPO | Attending: Vascular Surgery | Admitting: Vascular Surgery

## 2020-05-08 ENCOUNTER — Inpatient Hospital Stay (HOSPITAL_COMMUNITY): Payer: PPO

## 2020-05-08 DIAGNOSIS — I10 Essential (primary) hypertension: Secondary | ICD-10-CM | POA: Diagnosis not present

## 2020-05-08 DIAGNOSIS — Z823 Family history of stroke: Secondary | ICD-10-CM

## 2020-05-08 DIAGNOSIS — Z888 Allergy status to other drugs, medicaments and biological substances status: Secondary | ICD-10-CM

## 2020-05-08 DIAGNOSIS — Z7901 Long term (current) use of anticoagulants: Secondary | ICD-10-CM | POA: Diagnosis not present

## 2020-05-08 DIAGNOSIS — E785 Hyperlipidemia, unspecified: Secondary | ICD-10-CM | POA: Diagnosis present

## 2020-05-08 DIAGNOSIS — Z800 Family history of malignant neoplasm of digestive organs: Secondary | ICD-10-CM

## 2020-05-08 DIAGNOSIS — Z20822 Contact with and (suspected) exposure to covid-19: Secondary | ICD-10-CM | POA: Diagnosis present

## 2020-05-08 DIAGNOSIS — I739 Peripheral vascular disease, unspecified: Secondary | ICD-10-CM | POA: Diagnosis present

## 2020-05-08 DIAGNOSIS — I728 Aneurysm of other specified arteries: Secondary | ICD-10-CM | POA: Diagnosis present

## 2020-05-08 DIAGNOSIS — Z79899 Other long term (current) drug therapy: Secondary | ICD-10-CM | POA: Diagnosis not present

## 2020-05-08 DIAGNOSIS — Z7982 Long term (current) use of aspirin: Secondary | ICD-10-CM | POA: Diagnosis not present

## 2020-05-08 DIAGNOSIS — Z8051 Family history of malignant neoplasm of kidney: Secondary | ICD-10-CM | POA: Diagnosis not present

## 2020-05-08 DIAGNOSIS — Z87442 Personal history of urinary calculi: Secondary | ICD-10-CM | POA: Diagnosis not present

## 2020-05-08 DIAGNOSIS — Z8249 Family history of ischemic heart disease and other diseases of the circulatory system: Secondary | ICD-10-CM | POA: Diagnosis not present

## 2020-05-08 DIAGNOSIS — I714 Abdominal aortic aneurysm, without rupture, unspecified: Secondary | ICD-10-CM

## 2020-05-08 DIAGNOSIS — Z452 Encounter for adjustment and management of vascular access device: Secondary | ICD-10-CM

## 2020-05-08 DIAGNOSIS — Z83438 Family history of other disorder of lipoprotein metabolism and other lipidemia: Secondary | ICD-10-CM | POA: Diagnosis not present

## 2020-05-08 DIAGNOSIS — Z87891 Personal history of nicotine dependence: Secondary | ICD-10-CM

## 2020-05-08 DIAGNOSIS — I4821 Permanent atrial fibrillation: Secondary | ICD-10-CM | POA: Diagnosis not present

## 2020-05-08 HISTORY — PX: ABDOMINAL AORTIC ENDOVASCULAR FENESTRATED STENT GRAFT: SHX6430

## 2020-05-08 HISTORY — PX: REPAIR ILIAC ARTERY: SHX6216

## 2020-05-08 HISTORY — PX: PATCH ANGIOPLASTY: SHX6230

## 2020-05-08 HISTORY — PX: ULTRASOUND GUIDANCE FOR VASCULAR ACCESS: SHX6516

## 2020-05-08 LAB — POCT I-STAT 7, (LYTES, BLD GAS, ICA,H+H)
Acid-Base Excess: 2 mmol/L (ref 0.0–2.0)
Acid-Base Excess: 4 mmol/L — ABNORMAL HIGH (ref 0.0–2.0)
Acid-Base Excess: 1 mmol/L (ref 0.0–2.0)
Bicarbonate: 27.1 mmol/L (ref 20.0–28.0)
Bicarbonate: 28.4 mmol/L — ABNORMAL HIGH (ref 20.0–28.0)
Bicarbonate: 25.5 mmol/L (ref 20.0–28.0)
Calcium, Ion: 1.17 mmol/L (ref 1.15–1.40)
Calcium, Ion: 1.19 mmol/L (ref 1.15–1.40)
Calcium, Ion: 1.04 mmol/L — ABNORMAL LOW (ref 1.15–1.40)
HCT: 29 % — ABNORMAL LOW (ref 39.0–52.0)
HCT: 32 % — ABNORMAL LOW (ref 39.0–52.0)
HCT: 30 % — ABNORMAL LOW (ref 39.0–52.0)
Hemoglobin: 10.9 g/dL — ABNORMAL LOW (ref 13.0–17.0)
Hemoglobin: 9.9 g/dL — ABNORMAL LOW (ref 13.0–17.0)
Hemoglobin: 10.2 g/dL — ABNORMAL LOW (ref 13.0–17.0)
O2 Saturation: 99 %
O2 Saturation: 99 %
O2 Saturation: 99 %
Patient temperature: 36.2
Patient temperature: 36.3
Patient temperature: 36.4
Potassium: 3.7 mmol/L (ref 3.5–5.1)
Potassium: 3.9 mmol/L (ref 3.5–5.1)
Potassium: 3.7 mmol/L (ref 3.5–5.1)
Sodium: 139 mmol/L (ref 135–145)
Sodium: 140 mmol/L (ref 135–145)
Sodium: 140 mmol/L (ref 135–145)
TCO2: 28 mmol/L (ref 22–32)
TCO2: 30 mmol/L (ref 22–32)
TCO2: 27 mmol/L (ref 22–32)
pCO2 arterial: 40 mmHg (ref 32.0–48.0)
pCO2 arterial: 41.8 mmHg (ref 32.0–48.0)
pCO2 arterial: 40.2 mmHg (ref 32.0–48.0)
pH, Arterial: 7.436 (ref 7.350–7.450)
pH, Arterial: 7.437 (ref 7.350–7.450)
pH, Arterial: 7.408 (ref 7.350–7.450)
pO2, Arterial: 115 mmHg — ABNORMAL HIGH (ref 83.0–108.0)
pO2, Arterial: 133 mmHg — ABNORMAL HIGH (ref 83.0–108.0)
pO2, Arterial: 149 mmHg — ABNORMAL HIGH (ref 83.0–108.0)

## 2020-05-08 LAB — POCT ACTIVATED CLOTTING TIME
Activated Clotting Time: 261 seconds
Activated Clotting Time: 285 seconds
Activated Clotting Time: 249 seconds
Activated Clotting Time: 261 seconds

## 2020-05-08 LAB — BASIC METABOLIC PANEL
Anion gap: 12 (ref 5–15)
BUN: 15 mg/dL (ref 8–23)
CO2: 23 mmol/L (ref 22–32)
Calcium: 8 mg/dL — ABNORMAL LOW (ref 8.9–10.3)
Chloride: 103 mmol/L (ref 98–111)
Creatinine, Ser: 1.28 mg/dL — ABNORMAL HIGH (ref 0.61–1.24)
GFR, Estimated: 58 mL/min — ABNORMAL LOW (ref >60)
Glucose, Bld: 201 mg/dL — ABNORMAL HIGH (ref 70–99)
Potassium: 3.5 mmol/L (ref 3.5–5.1)
Sodium: 138 mmol/L (ref 135–145)

## 2020-05-08 LAB — APTT: aPTT: 32 seconds (ref 24–36)

## 2020-05-08 LAB — CBC
HCT: 33.1 % — ABNORMAL LOW (ref 39.0–52.0)
Hemoglobin: 11 g/dL — ABNORMAL LOW (ref 13.0–17.0)
MCH: 29.4 pg (ref 26.0–34.0)
MCHC: 33.2 g/dL (ref 30.0–36.0)
MCV: 88.5 fL (ref 80.0–100.0)
Platelets: 143 10*3/uL — ABNORMAL LOW (ref 150–400)
RBC: 3.74 MIL/uL — ABNORMAL LOW (ref 4.22–5.81)
RDW: 14.6 % (ref 11.5–15.5)
WBC: 9.3 10*3/uL (ref 4.0–10.5)
nRBC: 0 % (ref 0.0–0.2)

## 2020-05-08 LAB — PROTIME-INR
INR: 1.5 — ABNORMAL HIGH (ref 0.8–1.2)
Prothrombin Time: 17.2 seconds — ABNORMAL HIGH (ref 11.4–15.2)

## 2020-05-08 LAB — PREPARE RBC (CROSSMATCH)

## 2020-05-08 LAB — MAGNESIUM: Magnesium: 1.6 mg/dL — ABNORMAL LOW (ref 1.7–2.4)

## 2020-05-08 SURGERY — ABDOMINAL AORTIC ENDOVASCULAR FENESTRATED STENT GRAFT
Anesthesia: General | Site: Groin

## 2020-05-08 MED ORDER — BISACODYL 10 MG RE SUPP: 10.0000 mg | Freq: Every day | RECTAL | Status: DC | PRN

## 2020-05-08 MED ORDER — DEXAMETHASONE SODIUM PHOSPHATE 10 MG/ML IJ SOLN
INTRAMUSCULAR | Status: AC
  Filled 2020-05-08: qty 1

## 2020-05-08 MED ORDER — EPHEDRINE 5 MG/ML INJ
INTRAVENOUS | Status: AC
  Filled 2020-05-08: qty 10

## 2020-05-08 MED ORDER — FENTANYL CITRATE (PF) 250 MCG/5ML IJ SOLN
INTRAMUSCULAR | Status: DC | PRN
  Administered 2020-05-08 (×2): 25 ug via INTRAVENOUS
  Administered 2020-05-08: 50 ug via INTRAVENOUS
  Administered 2020-05-08: 100 ug via INTRAVENOUS

## 2020-05-08 MED ORDER — SODIUM CHLORIDE 0.9 % IV SOLN: INTRAVENOUS | Status: DC | PRN

## 2020-05-08 MED ORDER — FENTANYL CITRATE (PF) 250 MCG/5ML IJ SOLN
INTRAMUSCULAR | Status: AC
  Filled 2020-05-08: qty 5

## 2020-05-08 MED ORDER — ASPIRIN EC 81 MG PO TBEC
81.0000 mg | DELAYED_RELEASE_TABLET | Freq: Every day | ORAL | Status: DC
  Administered 2020-05-09: 81 mg via ORAL
  Filled 2020-05-08: qty 1

## 2020-05-08 MED ORDER — PROPOFOL 10 MG/ML IV BOLUS
INTRAVENOUS | Status: DC | PRN
  Administered 2020-05-08: 10 mg via INTRAVENOUS
  Administered 2020-05-08: 100 mg via INTRAVENOUS
  Administered 2020-05-08: 40 mg via INTRAVENOUS

## 2020-05-08 MED ORDER — METOPROLOL SUCCINATE ER 25 MG PO TB24
12.5000 mg | ORAL_TABLET | Freq: Every day | ORAL | Status: DC
  Administered 2020-05-09: 12.5 mg via ORAL
  Filled 2020-05-08: qty 1

## 2020-05-08 MED ORDER — METOPROLOL TARTRATE 5 MG/5ML IV SOLN: 2.0000 mg | INTRAVENOUS | Status: DC | PRN

## 2020-05-08 MED ORDER — MORPHINE SULFATE (PF) 2 MG/ML IV SOLN: 2.0000 mg | INTRAVENOUS | Status: DC | PRN

## 2020-05-08 MED ORDER — ACETAMINOPHEN 325 MG PO TABS
325.0000 mg | ORAL_TABLET | ORAL | Status: DC | PRN
  Administered 2020-05-09: 325 mg via ORAL
  Filled 2020-05-08: qty 1

## 2020-05-08 MED ORDER — PHENYLEPHRINE HCL-NACL 10-0.9 MG/250ML-% IV SOLN
INTRAVENOUS | Status: DC | PRN
  Administered 2020-05-08: 25 ug/min via INTRAVENOUS

## 2020-05-08 MED ORDER — ROCURONIUM BROMIDE 10 MG/ML (PF) SYRINGE
PREFILLED_SYRINGE | INTRAVENOUS | Status: AC
  Filled 2020-05-08: qty 10

## 2020-05-08 MED ORDER — CEFAZOLIN SODIUM 1 G IJ SOLR
INTRAMUSCULAR | Status: AC
  Filled 2020-05-08: qty 20

## 2020-05-08 MED ORDER — PANTOPRAZOLE SODIUM 40 MG PO TBEC
40.0000 mg | DELAYED_RELEASE_TABLET | Freq: Every day | ORAL | Status: DC
  Administered 2020-05-09: 40 mg via ORAL
  Filled 2020-05-08: qty 1

## 2020-05-08 MED ORDER — ONDANSETRON HCL 4 MG/2ML IJ SOLN
INTRAMUSCULAR | Status: AC
  Filled 2020-05-08: qty 2

## 2020-05-08 MED ORDER — GLYCOPYRROLATE PF 0.2 MG/ML IJ SOSY
PREFILLED_SYRINGE | INTRAMUSCULAR | Status: DC | PRN
  Administered 2020-05-08 (×2): .1 mg via INTRAVENOUS

## 2020-05-08 MED ORDER — SUGAMMADEX SODIUM 200 MG/2ML IV SOLN
INTRAVENOUS | Status: DC | PRN
  Administered 2020-05-08: 200 mg via INTRAVENOUS

## 2020-05-08 MED ORDER — HEPARIN SODIUM (PORCINE) 1000 UNIT/ML IJ SOLN
INTRAMUSCULAR | Status: AC
  Filled 2020-05-08: qty 2

## 2020-05-08 MED ORDER — ACETAMINOPHEN 500 MG PO TABS: 1000.0000 mg | ORAL_TABLET | Freq: Once | ORAL | Status: AC

## 2020-05-08 MED ORDER — DEXAMETHASONE SODIUM PHOSPHATE 10 MG/ML IJ SOLN
INTRAMUSCULAR | Status: DC | PRN
  Administered 2020-05-08: 10 mg via INTRAVENOUS

## 2020-05-08 MED ORDER — MAGNESIUM SULFATE 2 GM/50ML IV SOLN: 2.0000 g | Freq: Every day | INTRAVENOUS | Status: DC | PRN

## 2020-05-08 MED ORDER — CHLORHEXIDINE GLUCONATE 0.12 % MT SOLN
OROMUCOSAL | Status: AC
  Administered 2020-05-08: 15 mL via OROMUCOSAL
  Filled 2020-05-08: qty 15

## 2020-05-08 MED ORDER — HEPARIN SODIUM (PORCINE) 1000 UNIT/ML IJ SOLN
INTRAMUSCULAR | Status: DC | PRN
  Administered 2020-05-08: 10000 [IU] via INTRAVENOUS
  Administered 2020-05-08: 2000 [IU] via INTRAVENOUS

## 2020-05-08 MED ORDER — MIDAZOLAM HCL 2 MG/2ML IJ SOLN
INTRAMUSCULAR | Status: AC
  Filled 2020-05-08: qty 2

## 2020-05-08 MED ORDER — ALBUMIN HUMAN 5 % IV SOLN: INTRAVENOUS | Status: DC | PRN

## 2020-05-08 MED ORDER — ONDANSETRON HCL 4 MG/2ML IJ SOLN
INTRAMUSCULAR | Status: DC | PRN
  Administered 2020-05-08: 4 mg via INTRAVENOUS

## 2020-05-08 MED ORDER — DILTIAZEM HCL ER COATED BEADS 120 MG PO CP24
240.0000 mg | ORAL_CAPSULE | Freq: Every day | ORAL | Status: DC
  Administered 2020-05-08: 240 mg via ORAL
  Filled 2020-05-08: qty 2

## 2020-05-08 MED ORDER — CEFAZOLIN SODIUM-DEXTROSE 2-4 GM/100ML-% IV SOLN
INTRAVENOUS | Status: AC
  Filled 2020-05-08: qty 100

## 2020-05-08 MED ORDER — CEFAZOLIN SODIUM-DEXTROSE 2-4 GM/100ML-% IV SOLN
2.0000 g | INTRAVENOUS | Status: AC
  Administered 2020-05-08 (×2): 2 g via INTRAVENOUS

## 2020-05-08 MED ORDER — CEFAZOLIN SODIUM-DEXTROSE 2-4 GM/100ML-% IV SOLN
2.0000 g | Freq: Three times a day (TID) | INTRAVENOUS | Status: AC
  Administered 2020-05-08 – 2020-05-09 (×2): 2 g via INTRAVENOUS
  Filled 2020-05-08 (×2): qty 100

## 2020-05-08 MED ORDER — HYDRALAZINE HCL 20 MG/ML IJ SOLN: 5.0000 mg | INTRAMUSCULAR | Status: DC | PRN

## 2020-05-08 MED ORDER — CALCIUM CHLORIDE 10 % IV SOLN
INTRAVENOUS | Status: AC
  Filled 2020-05-08: qty 10

## 2020-05-08 MED ORDER — SODIUM CHLORIDE 0.9 % IV SOLN: INTRAVENOUS | Status: DC

## 2020-05-08 MED ORDER — LIDOCAINE 2% (20 MG/ML) 5 ML SYRINGE
INTRAMUSCULAR | Status: DC | PRN
  Administered 2020-05-08: 80 mg via INTRAVENOUS

## 2020-05-08 MED ORDER — PROPOFOL 10 MG/ML IV BOLUS
INTRAVENOUS | Status: AC
  Filled 2020-05-08: qty 40

## 2020-05-08 MED ORDER — LABETALOL HCL 5 MG/ML IV SOLN: 10.0000 mg | INTRAVENOUS | Status: DC | PRN

## 2020-05-08 MED ORDER — PHENYLEPHRINE 40 MCG/ML (10ML) SYRINGE FOR IV PUSH (FOR BLOOD PRESSURE SUPPORT)
PREFILLED_SYRINGE | INTRAVENOUS | Status: DC | PRN
  Administered 2020-05-08 (×3): 120 ug via INTRAVENOUS

## 2020-05-08 MED ORDER — IODIXANOL 320 MG/ML IV SOLN
INTRAVENOUS | Status: DC | PRN
  Administered 2020-05-08: 50 mL via INTRA_ARTERIAL

## 2020-05-08 MED ORDER — ROSUVASTATIN CALCIUM 5 MG PO TABS: 5.0000 mg | ORAL_TABLET | Freq: Every evening | ORAL | Status: DC

## 2020-05-08 MED ORDER — FENTANYL CITRATE (PF) 100 MCG/2ML IJ SOLN: 25.0000 ug | INTRAMUSCULAR | Status: DC | PRN

## 2020-05-08 MED ORDER — ONDANSETRON HCL 4 MG/2ML IJ SOLN: 4.0000 mg | Freq: Once | INTRAMUSCULAR | Status: DC | PRN

## 2020-05-08 MED ORDER — GUAIFENESIN-DM 100-10 MG/5ML PO SYRP: 15.0000 mL | ORAL_SOLUTION | ORAL | Status: DC | PRN

## 2020-05-08 MED ORDER — CHLORHEXIDINE GLUCONATE 0.12 % MT SOLN: 15.0000 mL | Freq: Once | OROMUCOSAL | Status: AC

## 2020-05-08 MED ORDER — POTASSIUM CHLORIDE CRYS ER 20 MEQ PO TBCR: 20.0000 meq | EXTENDED_RELEASE_TABLET | Freq: Every day | ORAL | Status: DC | PRN

## 2020-05-08 MED ORDER — ORAL CARE MOUTH RINSE: 15.0000 mL | Freq: Once | OROMUCOSAL | Status: AC

## 2020-05-08 MED ORDER — ONDANSETRON HCL 4 MG/2ML IJ SOLN: 4.0000 mg | Freq: Four times a day (QID) | INTRAMUSCULAR | Status: DC | PRN

## 2020-05-08 MED ORDER — HEMOSTATIC AGENTS (NO CHARGE) OPTIME
TOPICAL | Status: DC | PRN
  Administered 2020-05-08: 1 via TOPICAL

## 2020-05-08 MED ORDER — DOCUSATE SODIUM 100 MG PO CAPS
100.0000 mg | ORAL_CAPSULE | Freq: Every day | ORAL | Status: DC
  Administered 2020-05-09: 100 mg via ORAL
  Filled 2020-05-08: qty 1

## 2020-05-08 MED ORDER — LACTATED RINGERS IV SOLN: INTRAVENOUS | Status: DC | PRN

## 2020-05-08 MED ORDER — PROTAMINE SULFATE 10 MG/ML IV SOLN
INTRAVENOUS | Status: DC | PRN
  Administered 2020-05-08: 50 mg via INTRAVENOUS

## 2020-05-08 MED ORDER — LACTATED RINGERS IV SOLN: INTRAVENOUS | Status: DC

## 2020-05-08 MED ORDER — POLYETHYLENE GLYCOL 3350 17 G PO PACK: 17.0000 g | PACK | Freq: Every day | ORAL | Status: DC | PRN

## 2020-05-08 MED ORDER — TAMSULOSIN HCL 0.4 MG PO CAPS
0.4000 mg | ORAL_CAPSULE | Freq: Every day | ORAL | Status: DC
  Administered 2020-05-09: 0.4 mg via ORAL
  Filled 2020-05-08: qty 1

## 2020-05-08 MED ORDER — CHLORHEXIDINE GLUCONATE CLOTH 2 % EX PADS: 6.0000 | MEDICATED_PAD | Freq: Once | CUTANEOUS | Status: DC

## 2020-05-08 MED ORDER — PHENOL 1.4 % MT LIQD: 1.0000 | OROMUCOSAL | Status: DC | PRN

## 2020-05-08 MED ORDER — ACETAMINOPHEN 650 MG RE SUPP: 325.0000 mg | RECTAL | Status: DC | PRN

## 2020-05-08 MED ORDER — CALCIUM CHLORIDE 10 % IV SOLN
INTRAVENOUS | Status: DC | PRN
  Administered 2020-05-08 (×2): 100 mg via INTRAVENOUS

## 2020-05-08 MED ORDER — SODIUM CHLORIDE 0.9 % IV SOLN: 500.0000 mL | Freq: Once | INTRAVENOUS | Status: DC | PRN

## 2020-05-08 MED ORDER — ALUM & MAG HYDROXIDE-SIMETH 200-200-20 MG/5ML PO SUSP: 15.0000 mL | ORAL | Status: DC | PRN

## 2020-05-08 MED ORDER — SODIUM CHLORIDE 0.9 % IV SOLN
INTRAVENOUS | Status: AC
  Filled 2020-05-08: qty 1.2

## 2020-05-08 MED ORDER — SODIUM CHLORIDE 0.9 % IV SOLN
INTRAVENOUS | Status: DC | PRN
  Administered 2020-05-08 (×2): 500 mL

## 2020-05-08 MED ORDER — GLYCOPYRROLATE PF 0.2 MG/ML IJ SOSY
PREFILLED_SYRINGE | INTRAMUSCULAR | Status: AC
  Filled 2020-05-08: qty 1

## 2020-05-08 MED ORDER — LORATADINE 10 MG PO TABS
10.0000 mg | ORAL_TABLET | Freq: Every day | ORAL | Status: DC
  Administered 2020-05-09: 10 mg via ORAL
  Filled 2020-05-08: qty 1

## 2020-05-08 MED ORDER — EPHEDRINE SULFATE-NACL 50-0.9 MG/10ML-% IV SOSY
PREFILLED_SYRINGE | INTRAVENOUS | Status: DC | PRN
  Administered 2020-05-08: 10 mg via INTRAVENOUS
  Administered 2020-05-08: 5 mg via INTRAVENOUS
  Administered 2020-05-08 (×3): 10 mg via INTRAVENOUS

## 2020-05-08 MED ORDER — ACETAMINOPHEN 500 MG PO TABS
ORAL_TABLET | ORAL | Status: AC
  Administered 2020-05-08: 1000 mg via ORAL
  Filled 2020-05-08: qty 2

## 2020-05-08 MED ORDER — TRIAMTERENE-HCTZ 37.5-25 MG PO TABS: 1.0000 | ORAL_TABLET | Freq: Every day | ORAL | Status: DC

## 2020-05-08 MED ORDER — CLOPIDOGREL BISULFATE 75 MG PO TABS: 150.0000 mg | ORAL_TABLET | Freq: Once | ORAL | Status: DC

## 2020-05-08 MED ORDER — OXYCODONE-ACETAMINOPHEN 5-325 MG PO TABS: 1.0000 | ORAL_TABLET | ORAL | Status: DC | PRN

## 2020-05-08 MED ORDER — ROCURONIUM BROMIDE 10 MG/ML (PF) SYRINGE
PREFILLED_SYRINGE | INTRAVENOUS | Status: DC | PRN
  Administered 2020-05-08: 20 mg via INTRAVENOUS
  Administered 2020-05-08 (×2): 30 mg via INTRAVENOUS
  Administered 2020-05-08: 10 mg via INTRAVENOUS
  Administered 2020-05-08: 60 mg via INTRAVENOUS
  Administered 2020-05-08: 20 mg via INTRAVENOUS

## 2020-05-08 MED ORDER — 0.9 % SODIUM CHLORIDE (POUR BTL) OPTIME
TOPICAL | Status: DC | PRN
  Administered 2020-05-08: 1000 mL

## 2020-05-08 MED ORDER — CLOPIDOGREL BISULFATE 75 MG PO TABS
75.0000 mg | ORAL_TABLET | Freq: Every day | ORAL | Status: DC
  Administered 2020-05-09: 75 mg via ORAL
  Filled 2020-05-08: qty 1

## 2020-05-08 MED ORDER — PHENYLEPHRINE 40 MCG/ML (10ML) SYRINGE FOR IV PUSH (FOR BLOOD PRESSURE SUPPORT)
PREFILLED_SYRINGE | INTRAVENOUS | Status: AC
  Filled 2020-05-08: qty 10

## 2020-05-08 SURGICAL SUPPLY — 92 items
ADH SKN CLS APL DERMABOND .7 (GAUZE/BANDAGES/DRESSINGS) ×6
BALLN CODA OCL 2-10-35-140-46 (BALLOONS) ×5
BALLN CODA OCL 2-9.0-35-120-3 (BALLOONS)
BALLN MUSTANG 8X20X75 (BALLOONS) ×5
BALLOON COD OCL 2-10-35-140-46 (BALLOONS) IMPLANT
BALLOON COD OCL 2-9.0-35-120-3 (BALLOONS) IMPLANT
BALLOON MUSTANG 8X20X75 (BALLOONS) IMPLANT
CANISTER SUCT 3000ML PPV (MISCELLANEOUS) ×5 IMPLANT
CATH ACCU-VU SIZ PIG 5F 100CM (CATHETERS) ×2 IMPLANT
CATH BEACON 5.038 65CM KMP-01 (CATHETERS) ×2 IMPLANT
CATH OMNI FLUSH .035X70CM (CATHETERS) ×2 IMPLANT
CATH QUICKCROSS .035X135CM (MICROCATHETER) ×2 IMPLANT
CATH QUICKCROSS SUPP .035X90CM (MICROCATHETER) ×4 IMPLANT
CLIP LIGATING EXTRA SM BLUE (MISCELLANEOUS) ×4 IMPLANT
CLIP TI WIDE RED SMALL 6 (CLIP) ×2 IMPLANT
DERMABOND ADVANCED (GAUZE/BANDAGES/DRESSINGS) ×4
DERMABOND ADVANCED .7 DNX12 (GAUZE/BANDAGES/DRESSINGS) ×3 IMPLANT
DEVICE CLOSURE PERCLS PRGLD 6F (VASCULAR PRODUCTS) IMPLANT
DEVICE INFLATION ENCORE 26 (MISCELLANEOUS) ×2 IMPLANT
DEVICE TORQUE KENDALL .025-038 (MISCELLANEOUS) ×5 IMPLANT
DRSG TEGADERM 2-3/8X2-3/4 SM (GAUZE/BANDAGES/DRESSINGS) ×10 IMPLANT
DRYSEAL FLEXSHEATH 18FR 33CM (SHEATH) ×2
DRYSEAL FLEXSHEATH 20FR 33CM (SHEATH) ×2
ELECT CAUTERY BLADE 6.4 (BLADE) ×2 IMPLANT
ELECT REM PT RETURN 9FT ADLT (ELECTROSURGICAL) ×5
ELECTRODE REM PT RTRN 9FT ADLT (ELECTROSURGICAL) ×6 IMPLANT
FELT TEFLON 1X6 (MISCELLANEOUS) ×2 IMPLANT
GAUZE 4X4 16PLY RFD (DISPOSABLE) ×2 IMPLANT
GAUZE SPONGE 2X2 8PLY STRL LF (GAUZE/BANDAGES/DRESSINGS) ×3 IMPLANT
GLOVE BIO SURGEON STRL SZ7.5 (GLOVE) ×5 IMPLANT
GLOVE BIOGEL PI IND STRL 8 (GLOVE) ×3 IMPLANT
GLOVE BIOGEL PI INDICATOR 8 (GLOVE) ×2
GOWN STRL REUS W/ TWL LRG LVL3 (GOWN DISPOSABLE) ×9 IMPLANT
GOWN STRL REUS W/ TWL XL LVL3 (GOWN DISPOSABLE) ×3 IMPLANT
GOWN STRL REUS W/TWL LRG LVL3 (GOWN DISPOSABLE)
GOWN STRL REUS W/TWL XL LVL3 (GOWN DISPOSABLE) ×20
GRAFT BALLN CATH 65CM (STENTS) IMPLANT
GRAFT EXCLUDER AORTIC 23X3.3CM (Endovascular Graft) ×2 IMPLANT
GRAFT FEN DIST EVAR 12X28X76 (Graft) ×2 IMPLANT
GRAFT FEN PROX EVAR 36X122M (Graft) ×2 IMPLANT
GRAFT LEG ILIAC ZSLE-20-56-ZT (Endovascular Graft) ×2 IMPLANT
GRAFT LEG ILIAC ZSLE-24-56-ZT (Endovascular Graft) ×2 IMPLANT
GUIDEWIRE ANGLED .035X150CM (WIRE) ×3 IMPLANT
GUIDEWIRE ANGLED .035X260CM (WIRE) ×2 IMPLANT
HEMOSTAT SNOW SURGICEL 2X4 (HEMOSTASIS) ×2 IMPLANT
KIT BASIN OR (CUSTOM PROCEDURE TRAY) ×5 IMPLANT
KIT TURNOVER KIT B (KITS) ×5 IMPLANT
LOOP VESSEL MAXI BLUE (MISCELLANEOUS) ×2 IMPLANT
LOOP VESSEL MINI RED (MISCELLANEOUS) ×6 IMPLANT
NS IRRIG 1000ML POUR BTL (IV SOLUTION) ×5 IMPLANT
PACK ENDOVASCULAR (PACKS) ×5 IMPLANT
PAD ARMBOARD 7.5X6 YLW CONV (MISCELLANEOUS) ×10 IMPLANT
PATCH VASC XENOSURE 1CMX6CM (Vascular Products) ×5 IMPLANT
PATCH VASC XENOSURE 1X6 (Vascular Products) IMPLANT
PENCIL BUTTON HOLSTER BLD 10FT (ELECTRODE) ×2 IMPLANT
PERCLOSE PROGLIDE 6F (VASCULAR PRODUCTS) ×25
SET MICROPUNCTURE 5F STIFF (MISCELLANEOUS) ×5 IMPLANT
SHEATH BRITE TIP 8FR 23CM (SHEATH) ×2 IMPLANT
SHEATH DRYSEAL FLEX 18FR 33CM (SHEATH) IMPLANT
SHEATH DRYSEAL FLEX 20FR 33CM (SHEATH) IMPLANT
SHEATH GUIDING 7F 55X73X9MM TD (SHEATH) ×2 IMPLANT
SHEATH HIGHFLEX ANSEL 7FR 55CM (SHEATH) ×2 IMPLANT
SHEATH PINNACLE 8F 10CM (SHEATH) ×2 IMPLANT
SPONGE GAUZE 2X2 STER 10/PKG (GAUZE/BANDAGES/DRESSINGS) ×2
SPONGE LAP 18X18 RF (DISPOSABLE) ×2 IMPLANT
STENT GRAFT BALLN CATH 65CM (STENTS)
STENT VIABAHNBX 7X29X135 (Permanent Stent) ×4 IMPLANT
STOPCOCK MORSE 400PSI 3WAY (MISCELLANEOUS) ×5 IMPLANT
SUT ETHILON 3 0 PS 1 (SUTURE) IMPLANT
SUT MNCRL AB 4-0 PS2 18 (SUTURE) ×10 IMPLANT
SUT PROLENE 5 0 C 1 24 (SUTURE) ×8 IMPLANT
SUT PROLENE 6 0 BV (SUTURE) ×6 IMPLANT
SUT SILK 2 0 (SUTURE) ×5
SUT SILK 2-0 18XBRD TIE 12 (SUTURE) IMPLANT
SUT SILK 3 0 (SUTURE) ×5
SUT SILK 3-0 18XBRD TIE 12 (SUTURE) IMPLANT
SUT SILK 4 0 (SUTURE) ×5
SUT SILK 4-0 18XBRD TIE 12 (SUTURE) IMPLANT
SUT VIC AB 2-0 CT1 27 (SUTURE) ×15
SUT VIC AB 2-0 CT1 TAPERPNT 27 (SUTURE) IMPLANT
SUT VIC AB 2-0 CTX 36 (SUTURE) IMPLANT
SUT VIC AB 3-0 SH 27 (SUTURE) ×5
SUT VIC AB 3-0 SH 27X BRD (SUTURE) IMPLANT
SYR 20ML LL LF (SYRINGE) ×5 IMPLANT
SYR BULB IRRIG 60ML STRL (SYRINGE) ×2 IMPLANT
TOWEL GREEN STERILE (TOWEL DISPOSABLE) ×5 IMPLANT
TRAY FOLEY MTR SLVR 16FR STAT (SET/KITS/TRAYS/PACK) ×5 IMPLANT
TUBING HIGH PRESSURE 120CM (CONNECTOR) ×5 IMPLANT
WIRE AMPLATZ SS-J .035X180CM (WIRE) ×4 IMPLANT
WIRE BENTSON .035X145CM (WIRE) ×4 IMPLANT
WIRE ROSEN-J .035X260CM (WIRE) ×4 IMPLANT
WIRE STIFF LUNDERQUIST 260MM (WIRE) ×4 IMPLANT

## 2020-05-08 NOTE — Op Note (Addendum)
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  Ultrasound-guided access of bilateral common femoral arteries with percutaneous closure for delivery of endograft 2.  Aortogram including catheter selection of aorta 3.  Fenestrated endograft for repair of juxtarenal abdominal aortic aneurysm using SMA scallop and two small renal fenestrations including bilateral renal artery stenting using 7 mm x 29 mm VBX stents (Cook Zfen device) 4.  Cutdown on left common femoral artery with endarterectomy and bovine pericardial patch angioplasty  Surgeon: Dr. [name], MD  Assistant: Dr. Servando Snare, MD; Dr. Marjean Donna, MD; and Risa Grill, PA  Indication: Patient is a 79 year old male that presented with a enlarging juxtarenal abdominal aortic aneurysm now measuring 6 cm in maximal diameter.  He was not a candidate for traditional endograft.  He presents today for planned fenestrated endograft using the [facility] Zfen device after risk benefits discussed.  An assistant was needed to expedite the case and for wire exchange.  Findings: Proximal main body was a 36 mm diameter graft with SMA scallop and two small renal fenestrations.  This was successfully deployed included placing 7 mm x 29 mm VBX stents in bilateral renal arteries and flaring these with 8 mm balloon.  Ultimately the distal main body was placed with maximal overlap and the gate was cannulated.  The right common iliac was treated with a 20 mm bell bottom of the left common iliac was treated with a 24 mm bell bottom.  There was a type Ib at the end of the case that was treated with balloon angioplasty on the left and extending with a 23 mm Gore cuff on the right.  At completion of the case there was no evidence of endoleak with patent SMA and bilateral renal artery stents and patent endograft with exclusion of the aneurysm.  Percutaneous closure device  did fail in the left common femoral artery requiring cutdown with femoral endarterectomy and bovine pericardial patch angioplasty.  Anesthesia: General  EBL: 1.2 L  Details: Patient was taken to the operating room after informed consent was obtained.  Placed on the operative table in supine position.  Bilateral groins and abdomen were then prepped and draped in usual sterile fashion after anesthesia was induced.  Timeout was performed to identify patient, procedure and site.  Initially accessed the right and then left common femoral artery under ultrasound guidance and placed a micro access needle then a microwire we made a small skin incision and dilated down on top of the artery with hemostat and then dilated with an 8 French sheath after placing a Bentson wire and then Perclose devices were deployed at 2:00 and 10:00 in both the right and left common femoral artery.  We exchanged for amplatz wires in both groins.  The patient was given 100 units per KG heparin and ASCT was checked to maintain greater than 250.  A short 8 French sheath was placed in the right common femoral artery and the proximal main body of the [employer] device was placed in the left common femoral artery which required a 22 French sheath.  This was a advanced up to the level of L2.  Pigtail catheter was advanced up the right.  We were careful to orient the graft prior to inserting it to ensure that we had a properly aligned.  We also looked in a steep lateral position to ensure the anterior portion of the graft where the SMA scallop  was indeed anterior.  After we shot aortogram both renal arteries were identified and the graft was positioned and the proximal main body was deployed and both renal fenestrations were oriented correctly.  That point in time we then placed a 18 French Gore dry seal sheath in the right common femoral artery up into the aneurysm.  We then used a tour guide steerable sheath and I was able to cannulate the right  renal artery through the small fenestration and advanced a Glidewire over which we used a quick cross catheter and exchanged for a Rosen wire for more support and then advanced a 7 [place] Ansell sheath.  We confirmed with hand-injection that we were indeed in the right renal artery.  I then used a tour guide catheter to cannulate the left small renal fenestration and got a glidewire out into the left renal and also placed a quick cross catheter exchanged for a Rosen wire and then we were able to get our steerable sheath out into the left renal artery.  I then advanced a 7 mm x 29 mm VBX stents out into each renal artery but within the right and left sheaths.  We then used a Coda balloon and performed angioplasty of the graft proximally to get adequate seal proximally in the visceral segment.  That point time we then initially deployed the left renal artery stent by pulling back the tour guide sheath and leaving the renal stent about 1 to 2 mm into the aorta.  This was then flared with an 8 mm balloon.  We then pulled back the sheath in the right renal artery and then deployed that VBX stent as well also leaving it about 1 to 2 mm in the aorta and this was flared with an 8 mm balloon.  We then shot an aortogram that showed excellent seal proximally with filling of both the right and left renal arteries and the SMA where the scallop was placed.  At that point time we then placed the distal main body device in the left common femoral artery after exchanging the sheath and this was deployed with maximal overlap to get good seal of both the proximal and distal main body stent.  This was deployed down until the gate was visualized.  I then came from the right groin and used a KMP catheter with a Glidewire to successfully cannulate the gate.  I was then able to exchange for a Rosen wire for more support and we confirmed with a Coda balloon that we were indeed within the gate of the distal main body.  We shot a retrograde  iliac shot to identify the right hypo with a marking pigtail catheter.  We then selected a 20 mm bell bottom that was deployed from the gate of the distal main body down to the takeoff of the right hypo-.  Subsequently we finished deploying the distal main body device down the left iliac.  Again we shot a retrograde image with marker pigtail catheter and then marked the takeoff of the left hypogastric artery and a 24 mm bellbottom was deployed on the left.  That point in time all overlapping segments of the stents below the renal stents were treated with Coda balloons including the distal iliac stents.  Another aortogram showed what looked like a type Ib.  We initially felt this was coming from the left limb I used a more aggressive coda balloon and this successfully resolved.  I did shoot bilateral retrograde iliac images to ensure  there is no iliac dissection which there was not.  We shot a retrograde image on the right there was what looked like a type Ib on the right iliac.  It looked like the limb shortened given he had very short common iliac arteries bilaterally.  We elected to then place a Gore 23 mm cuff here given it did not resolve with balloon.  Final injection showed excellent filling of the SMA and bilateral renal arteries with flow in the stent graft and filling of bilateral iliacs with no endoleak.  At that point in time we removed initially the large 20 French sheath in the left groin and tried to tie down the Perclose's and had no hemostasis.  I did try and place a third Perclose with no success ultimately we had to cut down on the left common femoral artery while holding manual pressure.  Ultimately we were finally able to get a Fogarty softgel clamp on the distal external iliac for proximal control and dissected out more distally to see the SFA and profunda where he also placed this baby profunda clamp.  Once we had vascular control, SFA and profunda was dissected out and loops placed.  It became  apparent that the common femoral artery was going to require patch angioplasty so all the wires were removed including the Prolene sutures from Proglide.  I then endarterectomized the left common femoral artery and a bovine pericardial patch was brought on the field and sewn to the left common femoral artery with 5-0 Prolene.  I also went ahead and removed the right common femoral 18 [place] Gore dry seal sheath tied down the Perclose devices at 10:00 2:00 while holding manual pressure.  We had good hemostasis in the right groin.  There were Doppler signals in both feet and 50 mg protamine was given.  The left groin was washed out and closed in multiple layers of 2-0 Vicryl, 3-0 Vicryl, 4-0 Monocryl, and Dermabond.  Complication: Failure of left common femoral Proglide requiring cut down with endarterectomy  Condition: Stable  [name], MD [facility] Office: [place]

## 2020-05-08 NOTE — Anesthesia Procedure Notes (Signed)
Procedure Name: Intubation Date/Time: 05/08/2020 7:38 AM Performed by: Rande Brunt, CRNA Pre-anesthesia Checklist: Patient identified, Emergency Drugs available, Suction available and Patient being monitored Patient Re-evaluated:Patient Re-evaluated prior to induction Oxygen Delivery Method: Circle System Utilized Preoxygenation: Pre-oxygenation with 100% oxygen Induction Type: IV induction Ventilation: Mask ventilation without difficulty Laryngoscope Size: Mac and 4 Grade View: Grade I Tube type: Oral Tube size: 7.5 mm Number of attempts: 1 Airway Equipment and Method: Stylet Placement Confirmation: ETT inserted through vocal cords under direct vision,  positive ETCO2 and breath sounds checked- equal and bilateral Secured at: 24 cm Tube secured with: Tape Dental Injury: Teeth and Oropharynx as per pre-operative assessment

## 2020-05-08 NOTE — H&P (Signed)
History and Physical Interval Note:  05/08/2020 7:01 AM  [name]  has presented today for surgery, with the diagnosis of ABDOMINAL AORTIC ANEURYSM.  The various methods of treatment have been discussed with the patient and family. After consideration of risks, benefits and other options for treatment, the patient has consented to  Procedure(s): ABDOMINAL AORTIC ENDOVASCULAR FENESTRATED STENT GRAFT (N/A) as a surgical intervention.  The patient's history has been reviewed, patient examined, no change in status, stable for surgery.  I have reviewed the patient's chart and labs.  Questions were answered to the patient's satisfaction.    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  Risks and benefits again discussed.  [name]  Patient name: [name]           MRN: [id number]        DOB: 02/12/1941          Sex: male  REASON FOR VISIT: F/U to discuss enlarging AAA after updated CTA  HPI: [name] is a 80 y.o. male with history of atrial fibrillation status post watchman now off anticoagulation, hypertension, hyperlipidemia that presents to discuss possible fenestrated endograft for his juxtra-renal AAA after recent CTA.    I was recently contacted by his urologist after he had a CT abdomen for surveillance of a renal mass that showed enlarging AAA.  In review of previous growth the aneurysm was 4.6 on 07/29/2017 enlarged to 5.1 and then 5.19 approximalte  6 months ago.  I reviewed the CT abdomen from 01/16/2020 obtained by his urologist that shows now 5.9 cm abdominal aortic aneurysm.  Given juxtarenal location of aneurysm we previously discussed open repair versus fenestrated endograft.  Ultimately sent him to cardiology who have cleared him after both an echo and lexicon stress test.  Ultimately he also had an updated CT scan for complete evaluation.  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      Past Medical History:  Diagnosis Date  . AAA (abdominal aortic aneurysm) ([place])   . Aneurysm ([place])   . Arthritis   . Degenerative joint disease   . History of blood transfusion 12/2007   "bleeding for Warfarin"  . Hyperlipidemia   . Hypertension    Admitted for chest pain and 06/2011-neg stress Myoview,mod. LVH with nl EF on echo; TSH of 3.75  . Meniere's disease   . Nephrolithiasis   . 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 Permanent atrial fibrillation ([place])    a. s/p Watchman  . Renal hemorrhage, right 2009   2009 while anticoagulated with Coumadin; treated with the renal artery embolization         Past Surgical History:  Procedure Laterality Date  . BIOPSY  02/21/2017   Procedure: BIOPSY;  Surgeon: Danie Binder, MD;  [facility];  Service: Endoscopy;;  gastric  . COLONOSCOPY N/A 02/21/2017   multiple polyps, one requiring piecemeal removal with epi and clip placement, multiple small and large mouthed diverticula, recto-sigmoid and sigmoid with significant excess looping, internal hemorrhoids. Path with one advanced adenoma, 3 simple adenomas, 2 hyperplastic polyps. Survelilance 3 years   . EMBOLIZATION  12/2007   Left renal hematoma, status post angiogram and embolization/notes 09/29/2010  . ESOPHAGOGASTRODUODENOSCOPY N/A 02/21/2017  benign-appearing esophageal stenosis s/p dilation, gastritis, scarred mucosa in pylorus due to prior PUD. reactive gastropathy on path  . FRACTURE SURGERY    . LEFT ATRIAL APPENDAGE OCCLUSION N/A 03/18/2016   Procedure: LEFT ATRIAL APPENDAGE OCCLUSION;  Surgeon: [name], MD;  Location: [facility];  Service: Cardiovascular;  Laterality: N/A;  . ORIF FEMUR FRACTURE Right 1960   "2 steel rods in leg"  . PILONIDAL CYST EXCISION     . POLYPECTOMY  02/21/2017   Procedure: POLYPECTOMY;  Surgeon: Danie Binder, MD;  [facility];  Service: Endoscopy;;  colon  . SAVORY DILATION N/A 02/21/2017   Procedure: SAVORY DILATION;  Surgeon: Danie Binder, MD;  [facility];  Service: Endoscopy;  Laterality: N/A;  . TEE WITHOUT CARDIOVERSION N/A 03/10/2016   Procedure: TRANSESOPHAGEAL ECHOCARDIOGRAM (TEE);  Surgeon: Josue Hector, MD;  Location: [facility];  Service: Cardiovascular;  Laterality: N/A;  . TEE WITHOUT CARDIOVERSION N/A 04/30/2016   Procedure: TRANSESOPHAGEAL ECHOCARDIOGRAM (TEE);  Surgeon: Josue Hector, MD;  Location: [facility];  Service: Cardiovascular;  Laterality: N/A;  . Watchman procedure  02/2016         Family History  Problem Relation Age of Onset  . Hypertension Mother   . Hyperlipidemia Mother   . Renal cancer Mother   . Atrial fibrillation Sister   . Stroke Father   . Colon cancer Cousin   . Colon polyps Neg Hx     SOCIAL HISTORY: Social History        Tobacco Use  . Smoking status: Former Smoker    Packs/day: 1.00    Years: 40.00    Pack years: 40.00    Quit date: 01/20/2008    Years since quitting: 12.1  . Smokeless tobacco: Never Used  Substance Use Topics  . Alcohol use: Not Currently    Alcohol/week: 1.0 standard drink    Types: 1 Standard drinks or equivalent per week    Comment: rare wine         Allergies  Allergen Reactions  . Statins Other (See Comments)    Muscle aches Muscle aches  . Rivaroxaban Other (See Comments)    Developed redness in both legs Developed redness in both legs          Current Outpatient Medications  Medication Sig Dispense Refill  . acetaminophen (TYLENOL) 500 MG tablet Take 1,000 mg by mouth every 8 (eight) hours as needed (pain).    Marland Kitchen aspirin EC 325 MG tablet Take 325 mg by mouth daily.    . cetirizine (ZYRTEC) 10 MG tablet Take 10 mg by mouth at bedtime.      Marland Kitchen diltiazem (CARDIZEM CD) 240 MG 24 hr capsule Take 240 mg by mouth at bedtime.     . fluticasone (FLONASE) 50 MCG/ACT nasal spray Place 1 spray into both nostrils 2 (two) times daily.     Marland Kitchen LORazepam (ATIVAN) 0.5 MG tablet Take 0.5 mg by mouth at bedtime.     . meclizine (ANTIVERT) 25 MG tablet Take 25 mg by mouth 2 (two) times daily. For dizziness    . metoprolol succinate (TOPROL-XL) 25 MG 24 hr tablet Take 0.5 tablets (12.5 mg total) by mouth daily.    . Multiple Vitamin (MULTIVITAMIN) tablet Take 1 tablet by mouth daily.    Marland Kitchen MYRBETRIQ 50 MG TB24 tablet Take 50 mg by mouth daily.    . pantoprazole (PROTONIX) 40 MG tablet TAKE 1 TABLET BY MOUTH DAILY 30 MINUTES  PRIOR TO BREAKFAST. 30 tablet 3  . rosuvastatin (CRESTOR) 5 MG tablet Take 5 mg by mouth daily.    . tamsulosin (FLOMAX) 0.4 MG CAPS capsule Take 0.4 mg by mouth daily.     Marland Kitchen triamterene-hydrochlorothiazide (MAXZIDE-25) 37.5-25 MG per tablet Take 1 tablet by mouth daily.     No current facility-administered medications for this visit.    REVIEW OF SYSTEMS:  [X]  denotes positive finding, [ ]  denotes negative finding Cardiac  Comments:  Chest pain or chest pressure:    Shortness of breath upon exertion:    Short of breath when lying flat:    Irregular heart rhythm:        Vascular    Pain in calf, thigh, or hip brought on by ambulation:    Pain in feet at night that wakes you up from your sleep:     Blood clot in your veins:    Leg swelling:         Pulmonary    Oxygen at home:    Productive cough:     Wheezing:         Neurologic    Sudden weakness in arms or legs:     Sudden numbness in arms or legs:     Sudden onset of difficulty speaking or slurred speech:    Temporary loss of vision in one eye:     Problems with dizziness:         Gastrointestinal    Blood in stool:     Vomited blood:         Genitourinary    Burning when  urinating:     Blood in urine:        Psychiatric    Major depression:         Hematologic    Bleeding problems:    Problems with blood clotting too easily:        Skin    Rashes or ulcers:        Constitutional    Fever or chills:      PHYSICAL EXAM:    Vitals:   03/18/20 1326  BP: 119/79  Pulse: 66  Resp: 18  Temp: 97.6 F (36.4 C)  TempSrc: Temporal  SpO2: 96%  Weight: 215 lb (97.5 kg)  Height: 5\' 9"  (1.753 m)    GENERAL: The patient is a well-nourished male, in no acute distress. The vital signs are documented above. CARDIAC: There is a regular rate and rhythm.  VASCULAR:  2+ femoral pulse palpable bilateral groins 2+ PT palpable bilateral lower extremities PULMONARY: There is good air exchange bilaterally without wheezing or rales. ABDOMEN: Soft and non-tender with normal pitched bowel sounds. No pain with palpation of aneurysm. MUSCULOSKELETAL: There are no major deformities or cyanosis. NEUROLOGIC: No focal weakness or paresthesias are detected.   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  Assessment/Plan:  58-year male presents to discuss enlarging abdominal aortic aneurysm noted on recent CT in August for surveillance of a renal mass.  His juxtarenal aneurysm now measures greater than 6 cm.  We previously discussed options for proceeding with repair including open repair versus fenestrated endograft.  He has elected to proceed with fenestrated endograft.  He has been cleared by cardiology and I appreciate Dr. Marlou Porch assistance after echocardiogram and nuclear stress test.  We discussed that I have already reviewed his imaging with Lacinda Axon and he is a good  candidate and now with the updated CT scan we can build his endograft.  This will likely take up to 6 weeks.  I will schedule him in mid December and have my partner Dr. Donzetta Matters assist with  surgery.  We discussed risk and benefits including risk of vessel injury, renal insufficiency possibly requiring dialysis, risk of anesthesia, stent endoleak, risk of heart attack stroke, bleeding, infection, even death.  He understands that the current size aneurysm has about a 15% yearly rupture risk.  Discussed that the endograft will be 2 small fenestrations and an SMA scallop and this will require stenting of his renal arteries to get proximal seal.  [name], MD [facility] Office: [place]

## 2020-05-08 NOTE — Transfer of Care (Signed)
Immediate Anesthesia Transfer of Care Note  Patient: [name]  Procedure(s) Performed: ABDOMINAL AORTIC ENDOVASCULAR FENESTRATED STENT GRAFT (N/A Abdomen) ULTRASOUND GUIDANCE FOR VASCULAR ACCESS, bilateral femoral arteries (Bilateral Groin) EXPLORATION AND REPAIR LEFT FEMORAL ARTERY (Left Groin) PATCH ANGIOPLASTY LEFT FEMORAL ARTERY (Left Groin)  Patient Location: PACU  Anesthesia Type:General  Level of Consciousness: drowsy, patient cooperative and responds to stimulation  Airway & Oxygen Therapy: Patient Spontanous Breathing and Patient connected to face mask oxygen  Post-op Assessment: Report given to RN, Post -op Vital signs reviewed and stable and Patient moving all extremities  Post vital signs: Reviewed and stable on Neo synephrine gtt 25 mcg/min  Last Vitals:  Vitals Value Taken Time  BP 95/72 05/08/20 1258  Temp    Pulse 81 05/08/20 1258  Resp 24 05/08/20 1258  SpO2 99 % 05/08/20 1258  Vitals shown include unvalidated device data.  Last Pain:  Vitals:   05/08/20 0619  TempSrc:   PainSc: 0-No pain         Complications: No complications documented.

## 2020-05-08 NOTE — Progress Notes (Signed)
Arrived from PACU.   CCMD notified.  Vital sign monitoring.  CHG completed. Alert, oriented

## 2020-05-08 NOTE — Anesthesia Procedure Notes (Signed)
Arterial Line Insertion Start/End12/01/2020 6:40 AM, 05/08/2020 6:45 AM Performed by: Rande Brunt, CRNA, CRNA  Patient location: Pre-op. Preanesthetic checklist: patient identified, IV checked, site marked, risks and benefits discussed, surgical consent, monitors and equipment checked, pre-op evaluation, timeout performed and anesthesia consent Lidocaine 1% used for infiltration Right, radial was placed Catheter size: 20 G Hand hygiene performed  and maximum sterile barriers used   Attempts: 1 Procedure performed without using ultrasound guided technique. Following insertion, dressing applied and Biopatch. Post procedure assessment: normal and unchanged  Patient tolerated the procedure well with no immediate complications.

## 2020-05-08 NOTE — Progress Notes (Signed)
  Progress Note    05/08/2020 4:21 PM Day of Surgery  Subjective: comfortable. States some pain in left groin   Vitals:   05/08/20 1416 05/08/20 1442  BP: 113/68 95/67  Pulse: 74 (P) 74  Resp: 19 (P) 17  Temp: 97.6 F (36.4 C) (P) 97.9 F (36.6 C)  SpO2: 97% 97%   Physical Exam: Cardiac: regular rate and rhythm Lungs:  Non labored Incisions:  Left groin incision is clean, dry and intact. Soft without hematoma or swelling Extremities:  2+ femoral pulses bilaterally, bilateral lower extremities warm with palpable DP pulses Abdomen:  Obese, soft non distended Neurologic: alert and oriented  CBC    Component Value Date/Time   WBC 9.3 05/08/2020 1306   RBC 3.74 (L) 05/08/2020 1306   HGB 11.0 (L) 05/08/2020 1306   HCT 33.1 (L) 05/08/2020 1306   PLT 143 (L) 05/08/2020 1306   MCV 88.5 05/08/2020 1306   MCH 29.4 05/08/2020 1306   MCHC 33.2 05/08/2020 1306   RDW 14.6 05/08/2020 1306   LYMPHSABS 0.5 (L) 03/29/2018 1352   MONOABS 0.5 03/29/2018 1352   EOSABS 0.1 03/29/2018 1352   BASOSABS 0.0 03/29/2018 1352    BMET    Component Value Date/Time   NA 138 05/08/2020 1306   K 3.5 05/08/2020 1306   CL 103 05/08/2020 1306   CO2 23 05/08/2020 1306   GLUCOSE 201 (H) 05/08/2020 1306   BUN 15 05/08/2020 1306   CREATININE 1.28 (H) 05/08/2020 1306   CREATININE 1.30 12/08/2012 0705   CALCIUM 8.0 (L) 05/08/2020 1306   GFRNONAA 58 (L) 05/08/2020 1306   GFRAA >60 03/29/2018 1352    INR    Component Value Date/Time   INR 1.5 (H) 05/08/2020 1306     Intake/Output Summary (Last 24 hours) at 05/08/2020 1621 Last data filed at 05/08/2020 1431 Gross per 24 hour  Intake 2830 ml  Output 1975 ml  Net 855 ml     Assessment/Plan:  79 y.o. male is s/p  1.Ultrasound-guided access of bilateral common femoral arteries with percutaneous closure for delivery of endograft 2.  Aortogram including catheter selection of aorta 3.  Fenestrated endograft for repair of juxtarenal abdominal  aortic aneurysm using SMA scallop and two small renal fenestrations including bilateral renal artery stenting using 7 mm x 29 mm VBX stents (Cook Zfen device) 4.  Cutdown on left common femoral artery with endarterectomy and bovine pericardial patch angioplasty Day of Surgery   BLE well perfused and warm with palpable DP bilaterally. Pain well controlled. Left groin incision is clean, dry and intact. Soft without hematoma or swelling. Hemodynamically stable post transfusion. Good urine output. Scr stable. Tolerating liquids. Advance diet as tolerated. Oob to chair and mobilize starting tomorrow morning   Karoline Caldwell, [place] [facility] [phone] 05/08/2020 4:21 PM

## 2020-05-09 ENCOUNTER — Encounter (HOSPITAL_COMMUNITY): Payer: Self-pay | Admitting: Vascular Surgery

## 2020-05-09 LAB — BPAM RBC
Blood Product Expiration Date: 202201082359
Blood Product Expiration Date: 202201082359
ISSUE DATE / TIME: 202112091055
ISSUE DATE / TIME: 202112091055
Unit Type and Rh: 6200
Unit Type and Rh: 6200

## 2020-05-09 LAB — TYPE AND SCREEN
ABO/RH(D): A POS
Antibody Screen: NEGATIVE
Unit division: 0
Unit division: 0

## 2020-05-09 LAB — CBC
HCT: 29.5 % — ABNORMAL LOW (ref 39.0–52.0)
Hemoglobin: 10.1 g/dL — ABNORMAL LOW (ref 13.0–17.0)
MCH: 29.6 pg (ref 26.0–34.0)
MCHC: 34.2 g/dL (ref 30.0–36.0)
MCV: 86.5 fL (ref 80.0–100.0)
Platelets: 133 10*3/uL — ABNORMAL LOW (ref 150–400)
RBC: 3.41 MIL/uL — ABNORMAL LOW (ref 4.22–5.81)
RDW: 14.6 % (ref 11.5–15.5)
WBC: 10.2 10*3/uL (ref 4.0–10.5)
nRBC: 0 % (ref 0.0–0.2)

## 2020-05-09 LAB — BASIC METABOLIC PANEL
Anion gap: 10 (ref 5–15)
BUN: 14 mg/dL (ref 8–23)
CO2: 25 mmol/L (ref 22–32)
Calcium: 8.3 mg/dL — ABNORMAL LOW (ref 8.9–10.3)
Chloride: 102 mmol/L (ref 98–111)
Creatinine, Ser: 1.11 mg/dL (ref 0.61–1.24)
GFR, Estimated: >60 mL/min (ref >60)
Glucose, Bld: 158 mg/dL — ABNORMAL HIGH (ref 70–99)
Potassium: 3.7 mmol/L (ref 3.5–5.1)
Sodium: 137 mmol/L (ref 135–145)

## 2020-05-09 MED ORDER — CLOPIDOGREL BISULFATE 75 MG PO TABS
75.0000 mg | ORAL_TABLET | Freq: Every day | ORAL | 2 refills | Status: DC
Start: 2020-05-10 — End: 2020-08-07

## 2020-05-09 MED ORDER — OXYCODONE-ACETAMINOPHEN 5-325 MG PO TABS
1.0000 | ORAL_TABLET | ORAL | 0 refills | Status: DC | PRN
Start: 2020-05-09 — End: 2020-06-25

## 2020-05-09 NOTE — Progress Notes (Signed)
Pt discharging home with family.  All instructions given and reviewed, all questions answered.   °

## 2020-05-09 NOTE — Anesthesia Postprocedure Evaluation (Signed)
Anesthesia Post Note  Patient: [name]  Procedure(s) Performed: ABDOMINAL AORTIC ENDOVASCULAR FENESTRATED STENT GRAFT (N/A Abdomen) ULTRASOUND GUIDANCE FOR VASCULAR ACCESS, bilateral femoral arteries (Bilateral Groin) EXPLORATION AND REPAIR LEFT FEMORAL ARTERY (Left Groin) PATCH ANGIOPLASTY LEFT FEMORAL ARTERY (Left Groin)     Patient location during evaluation: PACU Anesthesia Type: General Level of consciousness: awake and alert Pain management: pain level controlled Vital Signs Assessment: post-procedure vital signs reviewed and stable Respiratory status: spontaneous breathing, nonlabored ventilation, respiratory function stable and patient connected to nasal cannula oxygen Cardiovascular status: blood pressure returned to baseline and stable Postop Assessment: no apparent nausea or vomiting Anesthetic complications: no   No complications documented.  Last Vitals:  Vitals:   05/09/20 0024 05/09/20 0335  BP: 132/71 121/74  Pulse: 78 88  Resp: 16 16  Temp: 36.9 C 36.8 C  SpO2: 97% 93%    Last Pain:  Vitals:   05/09/20 0444  TempSrc:   PainSc: 1    Pain Goal: Patients Stated Pain Goal: 0 (05/09/20 0343)                 Catalina Gravel

## 2020-05-09 NOTE — Discharge Instructions (Signed)
  [facility]   Discharge Instructions  Endovascular Aortic Aneurysm Repair  Please refer to the following instructions for your post-procedure care. Your surgeon or Physician Assistant will discuss any changes with you.  Activity  You are encouraged to walk as much as you can. You can slowly return to normal activities but must avoid strenuous activity and heavy lifting until your doctor tells you it's OK. Avoid activities such as vacuuming or swinging a gold club. It is normal to feel tired for several weeks after your surgery. Do not drive until your doctor gives the OK and you are no longer taking prescription pain medications. It is also normal to have difficulty with sleep habits, eating, and bowel movements after surgery. These will go away with time.  Bathing/Showering  Shower daily after you go home.  Do not soak in a bathtub, hot tub, or swim until the incision heals completely.  If you have incisions in your groin, wash the groin wounds with soap and water daily and pat dry. (No tub bath-only shower)  Then put a dry gauze or washcloth there to keep this area dry to help prevent wound infection daily and as needed.  Do not use Vaseline or neosporin on your incisions.  Only use soap and water on your incisions and then protect and keep dry.  Incision Care  Shower every day. Clean your incision with mild soap and water. Pat the area dry with a clean towel. You do not need a bandage unless otherwise instructed. Do not apply any ointments or creams to your incision. If you clothing is irritating, you may cover your incision with a dry gauze pad.  Diet  Resume your normal diet. There are no special food restrictions following this procedure. A low fat/low cholesterol diet is recommended for all patients with vascular disease. In order to heal from your surgery, it is CRITICAL to get adequate nutrition. Your body requires vitamins, minerals, and protein.  Vegetables are the best source of vitamins and minerals. Vegetables also provide the perfect balance of protein. Processed food has little nutritional value, so try to avoid this.  Medications  Resume taking all of your medications unless your doctor or nurse practitioner tells you not to. If your incision is causing pain, you may take over-the-counter pain relievers such as acetaminophen (Tylenol). If you were prescribed a stronger pain medication, please be aware these medications can cause nausea and constipation. Prevent nausea by taking the medication with a snack or meal. Avoid constipation by drinking plenty of fluids and eating foods with a high amount of fiber, such as fruits, vegetables, and grains.  Do not take Tylenol if you are taking prescription pain medications.   Follow up  Our office will schedule a follow-up appointment with a CT scan 3-4 weeks after your surgery.  Please call us immediately for any of the following conditions  Severe or worsening pain in your legs or feet or in your abdomen back or chest. Increased pain, redness, drainage (pus) from your incision site. Increased abdominal pain, bloating, nausea, vomiting or persistent diarrhea. Fever of 101 degrees or higher. Swelling in your leg (s),  Reduce your risk of vascular disease  Stop smoking. If you would like help call [facility] at 1-800-QUIT-NOW (1-800-784-8669) or [place] at [phone]. Manage your cholesterol Maintain a desired weight Control your diabetes Keep your blood pressure down  If you have questions, please call the office at [phone].  

## 2020-05-09 NOTE — Progress Notes (Addendum)
Progress Note    05/09/2020 7:37 AM 1 Day Post-Op  Subjective:  States he did not sleep very well due to left groin incisional pain   Vitals:   05/09/20 0024 05/09/20 0335  BP: 132/71 121/74  Pulse: 78 88  Resp: 16 16  Temp: 98.4 F (36.9 C) 98.2 F (36.8 C)  SpO2: 97% 93%   Physical Exam: Cardiac:  regular Lungs: non labored Incisions:  Left groin incision clean, dry and intact. Dry gauze applied. Ecchymosis present. No swelling or hematoma Extremities: Bilateral lower extremities well perfsued and warm. Palpable Dp pulses bilaterally. Doppler DP/ PT signals bilaterally Abdomen: soft, non tender, non distended Neurologic: alert and oriented  CBC    Component Value Date/Time   WBC 10.2 05/09/2020 0333   RBC 3.41 (L) 05/09/2020 0333   HGB 10.1 (L) 05/09/2020 0333   HCT 29.5 (L) 05/09/2020 0333   PLT 133 (L) 05/09/2020 0333   MCV 86.5 05/09/2020 0333   MCH 29.6 05/09/2020 0333   MCHC 34.2 05/09/2020 0333   RDW 14.6 05/09/2020 0333   LYMPHSABS 0.5 (L) 03/29/2018 1352   MONOABS 0.5 03/29/2018 1352   EOSABS 0.1 03/29/2018 1352   BASOSABS 0.0 03/29/2018 1352    BMET    Component Value Date/Time   NA 137 05/09/2020 0333   K 3.7 05/09/2020 0333   CL 102 05/09/2020 0333   CO2 25 05/09/2020 0333   GLUCOSE 158 (H) 05/09/2020 0333   BUN 14 05/09/2020 0333   CREATININE 1.11 05/09/2020 0333   CREATININE 1.30 12/08/2012 0705   CALCIUM 8.3 (L) 05/09/2020 0333   GFRNONAA >60 05/09/2020 0333   GFRAA >60 03/29/2018 1352    INR    Component Value Date/Time   INR 1.5 (H) 05/08/2020 1306     Intake/Output Summary (Last 24 hours) at 05/09/2020 0737 Last data filed at 05/09/2020 0416 Gross per 24 hour  Intake 3280 ml  Output 3650 ml  Net -370 ml     Assessment/Plan:  79 y.o. male is s/p 1.  Ultrasound-guided access of bilateral common femoral arteries with percutaneous closure for delivery of endograft 2.  Aortogram including catheter selection of aorta 3.   Fenestrated endograft for repair of juxtarenal abdominal aortic aneurysm using SMA scallop and two small renal fenestrations including bilateral renal artery stenting using 7 mm x 29 mm VBX stents (Cook Zfen device) 4.  Cutdown on left common femoral artery with endarterectomy and bovine pericardial patch angioplasty 1 Day Post-Op   Bilateral lower extremities well perfused and warm. Palpable DP bilaterally. Doppler PT signals bilaterally. Left groin incision clean, dry and intact without hematoma. Incisional pain tolerable. Tolerating diet. Voiding without difficulty. Has ambulated in room without difficulty. Hemodynamically stable. He will continue on his Aspirin, Statin and Plavix. Continue to mobilize. Anticipate discharge home later today or tomorrow  DVT prophylaxis:  Sq heparin   Karoline Caldwell, PA-C [facility] [phone] 05/09/2020 7:37 AM   I have seen and evaluated the patient. I agree with the PA note as documented above.  79 year old male postop day 1 status post fenestrated aortic stent graft with SMA scallop and 2 small renal fenestrations for 6 cm juxtarenal aneurysm.  He has been very stable overnight.  He did require left groin cutdown in the OR after the mynx failed.  This morning his hemoglobin is stable at 10.1.  Creatinine is stable at 1.11.  He has voided.  He has also ambulated.  He feels ready for discharge and  feels his pain is controlled.  Discussed I will have him follow-up in 3 weeks for both a groin check and a CTA abdomen pelvis to look at the stent graft.  I have contacted my office to move his CT scan which for some reason was scheduled for next week and we will move it out to 3 weeks.  He will need to be on aspirin Plavix at discharge.  He knows to call if he has any concerns about his groin and I discussed him keeping a dry dressing here.  He does have palpable pedal pulses with DPs that are very brisk.  Very pleased with his progress.   Hemodynamics have also been stable.  Tolerating p.o. with no abdominal pain.  Wants to go home.  [name], MD [facility] [place] Office: [phone]

## 2020-05-09 NOTE — Discharge Summary (Signed)
EVAR Discharge Summary   [name] [birth year] 79 y.o. male  MRN: [id number]  Admission Date: 05/08/2020  Discharge Date: 05/09/20  Physician: [name], MD  Admission Diagnosis: AAA (abdominal aortic aneurysm) [facility]) [I71.4]  Discharge Day services:    None   Hospital Course:  The patient was admitted to the hospital and taken to the operating room on 05/08/2020 and underwent: abdominal aortic endovascular fenestrated stent graft, exploration and repair of left femoral artery with patch angioplasty of left femoral artery  The pt tolerated the procedure well and was transported to the PACU in good condition.   Patient was later transferred to the flood in stable condition. Had some minimal left groin surgical site pain. Lower extremities well perfused and warm with palpable DP pulses bilaterally. He remained hemodynamically stable. He had good urine output and renal function remained stable. He was tolerating liquids  POD#1 patient did well overnight. Continued with some left groin surgical site pain. Bilateral lower extremities well perfused and warm with palpable DP and doppler PT signals. Tolerated ambulating. Voiding without difficulty. Hemodynamically stable. Renal function remained good. Hemoglobin stable post transfusion with no further bleeding issues  The remainder of the hospital course consisted of increasing mobilization and increasing intake of solids without difficulty.  Patient remained stable for discharge home. He will go home on Aspirin and Plavix. PDMP was reviewed and patient was sent post operative pain medication to his requested pharmacy. He has follow up arranged in 3 weeks for incision check and CTA follow up of his endovascular repair with Dr. Carlis Abbott  CBC    Component Value Date/Time   WBC 10.2 05/09/2020 0333   RBC 3.41 (L) 05/09/2020 0333   HGB 10.1 (L) 05/09/2020 0333   HCT 29.5 (L) 05/09/2020 0333   PLT 133 (L) 05/09/2020 0333    MCV 86.5 05/09/2020 0333   MCH 29.6 05/09/2020 0333   MCHC 34.2 05/09/2020 0333   RDW 14.6 05/09/2020 0333   LYMPHSABS 0.5 (L) 03/29/2018 1352   MONOABS 0.5 03/29/2018 1352   EOSABS 0.1 03/29/2018 1352   BASOSABS 0.0 03/29/2018 1352    BMET    Component Value Date/Time   NA 137 05/09/2020 0333   K 3.7 05/09/2020 0333   CL 102 05/09/2020 0333   CO2 25 05/09/2020 0333   GLUCOSE 158 (H) 05/09/2020 0333   BUN 14 05/09/2020 0333   CREATININE 1.11 05/09/2020 0333   CREATININE 1.30 12/08/2012 0705   CALCIUM 8.3 (L) 05/09/2020 0333   GFRNONAA >60 05/09/2020 0333   GFRAA >60 03/29/2018 1352       Discharge Instructions    Discharge patient   Complete by: As directed    Discharge disposition: 01-Home or Self Care   Discharge patient date: 05/09/2020      Discharge Diagnosis:  AAA (abdominal aortic aneurysm) ([place]) [I71.4]  Secondary Diagnosis: Patient Active Problem List   Diagnosis Date Noted  . AAA (abdominal aortic aneurysm) ([place]) 05/08/2020  . History of colon polyps 06/07/2017  . Gastritis   . Positive colorectal cancer screening using Cologuard test 01/04/2017  . Dysphagia 01/04/2017  . CAP (community acquired pneumonia) 04/04/2016  . Sepsis ([place]) 04/04/2016  . Hyponatremia 04/04/2016  . AKI (acute kidney injury) ([place]) 04/04/2016  . Hyperglycemia 04/04/2016  . Abdominal aortic aneurysm ([place]) 03/24/2012  . Fasting hyperglycemia 03/24/2012  . Peripheral vascular disease ([place]) 09/20/2011  . Chest pain 07/17/2011  . Hypertension   . Hyperlipidemia   . Chronic atrial fibrillation ([place])   .  Meniere's disease   . Renal hemorrhage, right    Past Medical History:  Diagnosis Date  . AAA (abdominal aortic aneurysm) ([place])   . Aneurysm ([place])   . Arthritis   . COPD (chronic obstructive pulmonary disease) ([place])   . Degenerative joint disease   . History of blood transfusion 12/2007   "bleeding for Warfarin"  . Hyperlipidemia   . Hypertension    Admitted for chest  pain and 06/2011-neg stress Myoview,mod. LVH with nl EF on echo; TSH of 3.75  . Meniere's disease   . Nephrolithiasis   . Peripheral vascular disease ([place])    Small AAA (3.7-4cm 09/2010), small SMA aneurysm (55mm)  . Permanent atrial fibrillation ([place])    a. s/p Watchman  . Renal hemorrhage, right 2009   2009 while anticoagulated with Coumadin; treated with the renal artery embolization     Allergies as of 05/09/2020      Reactions   Statins Other (See Comments)   Muscle aches Muscle aches   Rivaroxaban Other (See Comments)   Developed redness in both legs Developed redness in both legs      Medication List    TAKE these medications   acetaminophen 500 MG tablet Commonly known as: TYLENOL Take 1,000 mg by mouth every 8 (eight) hours as needed (pain).   aspirin EC 325 MG tablet Take 325 mg by mouth daily.   cetirizine 10 MG tablet Commonly known as: ZYRTEC Take 10 mg by mouth at bedtime.   cholecalciferol 25 MCG (1000 UNIT) tablet Commonly known as: VITAMIN D3 Take 1,000 Units by mouth daily.   clopidogrel 75 MG tablet Commonly known as: PLAVIX Take 1 tablet (75 mg total) by mouth daily at 6 (six) AM. Start taking on: May 10, 2020 Notes to patient: **NEW** For vascular disease   diltiazem 240 MG 24 hr capsule Commonly known as: CARDIZEM CD Take 240 mg by mouth at bedtime.   fluticasone 50 MCG/ACT nasal spray Commonly known as: FLONASE Place 1 spray into both nostrils every morning.   LORazepam 0.5 MG tablet Commonly known as: ATIVAN Take 0.5 mg by mouth at bedtime.   meclizine 25 MG tablet Commonly known as: ANTIVERT Take 25 mg by mouth 2 (two) times daily. For dizziness May take an additional dose at [facility] if needed   metoprolol succinate 25 MG 24 hr tablet Commonly known as: TOPROL-XL Take 0.5 tablets (12.5 mg total) by mouth daily.   multivitamin tablet Take 1 tablet by mouth daily.   oxyCODONE-acetaminophen 5-325 MG tablet Commonly known as:  PERCOCET/ROXICET Take 1-2 tablets by mouth every 4 (four) hours as needed for moderate pain. Notes to patient: **NEW** For moderate pain. May cause drowsiness, dizziness and constipation. May use over-the-counter docusate, miralax or senna for constipation   pantoprazole 40 MG tablet Commonly known as: PROTONIX TAKE 1 TABLET BY MOUTH DAILY 30 MINUTES PRIOR TO BREAKFAST. What changed: See the new instructions.   rosuvastatin 5 MG tablet Commonly known as: CRESTOR Take 5 mg by mouth every evening.   tamsulosin 0.4 MG Caps capsule Commonly known as: FLOMAX Take 0.4 mg by mouth in the morning and at bedtime.   triamterene-hydrochlorothiazide 37.5-25 MG tablet Commonly known as: MAXZIDE-25 Take 1 tablet by mouth daily.       Discharge Instructions:   [facility]  Discharge Instructions Endovascular Aortic Aneurysm Repair  Please refer to the following instructions for your post-procedure care. Your surgeon or Physician Assistant will discuss any changes  with you.  Activity  You are encouraged to walk as much as you can. You can slowly return to normal activities but must avoid strenuous activity and heavy lifting until your doctor tells you it's OK. Avoid activities such as vacuuming or swinging a gold club. It is normal to feel tired for several weeks after your surgery. Do not drive until your doctor gives the OK and you are no longer taking prescription pain medications. It is also normal to have difficulty with sleep habits, eating, and bowel movements after surgery. These will go away with time.  Bathing/Showering  You may shower after you go home. If you have an incision, do not soak in a bathtub, hot tub, or swim until the incision heals completely.  Incision Care  Shower every day. Clean your incision with mild soap and water. Pat the area dry with a clean towel. You do not need a bandage unless otherwise instructed. Do not apply any  ointments or creams to your incision. If you clothing is irritating, you may cover your incision with a dry gauze pad.  Diet  Resume your normal diet. There are no special food restrictions following this procedure. A low fat/low cholesterol diet is recommended for all patients with vascular disease. In order to heal from your surgery, it is CRITICAL to get adequate nutrition. Your body requires vitamins, minerals, and protein. Vegetables are the best source of vitamins and minerals. Vegetables also provide the perfect balance of protein. Processed food has little nutritional value, so try to avoid this.  Medications  Resume taking all of your medications unless your doctor or Physician Assistnat tells you not to. If your incision is causing pain, you may take over-the-counter pain relievers such as acetaminophen (Tylenol). If you were prescribed a stronger pain medication, please be aware these medications can cause nausea and constipation. Prevent nausea by taking the medication with a snack or meal. Avoid constipation by drinking plenty of fluids and eating foods with a high amount of fiber, such as fruits, vegetables, and grains. Do not take Tylenol if you are taking prescription pain medications.   Follow up  [facility] office will schedule a follow-up appointment with a C.T. scan 3-4 weeks after your surgery.  Please call us immediately for any of the following conditions  . Severe or worsening pain in your legs or feet or in your abdomen back or chest. . Increased pain, redness, drainage (pus) from your incision sit. . Increased abdominal pain, bloating, nausea, vomiting or persistent diarrhea. . Fever of 101 degrees or higher. . Swelling in your leg (s), .  Reduce your risk of vascular disease  .Stop smoking. If you would like help call [facility] at 1-800-QUIT-NOW 306 302 1112) or [place] at [phone]. .Manage your cholesterol .Maintain a desired weight .Control your  diabetes .Keep your blood pressure down  If you have questions, please call the office at [phone].    Prescriptions given: Hydrocodone- Acetaminophen 5-325 mg #20 No Refill  Disposition: Home  Patient's condition: is Excellent  Follow up: 1. Dr. Carlis Abbott in 3 weeks with CTA protocol   Karoline Caldwell, PA-C [facility] [phone] 05/09/2020  11:45 AM   - For VQI Registry use - Post-op:  Time to Extubation: [X]  In OR, [ ]  < 12 hrs, [ ]  12-24 hrs, [ ]  >=24 hrs Vasopressors Req. Post-op: No MI: No., [ ]  Troponin only, [ ]  EKG or Clinical New Arrhythmia: No CHF: No ICU Stay: 0 days Transfusion: Yes  If yes, 2 units given  Complications: Resp failure: No., [ ]  Pneumonia, [ ]  Ventilator Chg in renal function: No., [ ]  Inc. Cr > 0.5, [ ]  Temp. Dialysis,  [ ]  Permanent dialysis Leg ischemia: No., no Surgery needed, [ ]  Yes, Surgery needed,  [ ]  Amputation Bowel ischemia: No., [ ]  Medical Rx, [ ]  Surgical Rx Wound complication: No., [ ]  Superficial separation/infection, [ ]  Return to OR Return to OR: No  Return to OR for bleeding: No Stroke: No., [ ]  Minor, [ ]  Major  Discharge medications: Statin use:  Yes  ASA use:  Yes  Plavix use:  Yes  Beta blocker use:  Yes  ARB use:  No ACEI use:  No CCB use:  Yes

## 2020-05-14 ENCOUNTER — Other Ambulatory Visit: Payer: PPO

## 2020-05-29 ENCOUNTER — Other Ambulatory Visit: Payer: Self-pay

## 2020-05-29 ENCOUNTER — Ambulatory Visit
Admit: 2020-05-29 | Discharge: 2020-05-29 | Disposition: A | Discharge status: Home / Self Care | Payer: PPO | Attending: Vascular Surgery | Admitting: Vascular Surgery

## 2020-05-29 DIAGNOSIS — I723 Aneurysm of iliac artery: Secondary | ICD-10-CM | POA: Diagnosis not present

## 2020-05-29 DIAGNOSIS — I700 Atherosclerosis of aorta: Secondary | ICD-10-CM | POA: Diagnosis not present

## 2020-05-29 DIAGNOSIS — I714 Abdominal aortic aneurysm, without rupture, unspecified: Secondary | ICD-10-CM

## 2020-05-29 DIAGNOSIS — I745 Embolism and thrombosis of iliac artery: Secondary | ICD-10-CM | POA: Diagnosis not present

## 2020-05-29 DIAGNOSIS — J9811 Atelectasis: Secondary | ICD-10-CM | POA: Diagnosis not present

## 2020-05-29 MED ORDER — IOPAMIDOL (ISOVUE-370) INJECTION 76%
75.0000 mL | Freq: Once | INTRAVENOUS | Status: AC | PRN
  Administered 2020-05-29: 75 mL via INTRAVENOUS

## 2020-05-30 DIAGNOSIS — I4821 Permanent atrial fibrillation: Secondary | ICD-10-CM | POA: Diagnosis not present

## 2020-05-30 DIAGNOSIS — R944 Abnormal results of kidney function studies: Secondary | ICD-10-CM | POA: Diagnosis not present

## 2020-05-30 DIAGNOSIS — R7301 Impaired fasting glucose: Secondary | ICD-10-CM | POA: Diagnosis not present

## 2020-05-30 DIAGNOSIS — R7303 Prediabetes: Secondary | ICD-10-CM | POA: Diagnosis not present

## 2020-05-30 DIAGNOSIS — R519 Headache, unspecified: Secondary | ICD-10-CM | POA: Diagnosis not present

## 2020-05-30 DIAGNOSIS — E782 Mixed hyperlipidemia: Secondary | ICD-10-CM | POA: Diagnosis not present

## 2020-05-30 DIAGNOSIS — N401 Enlarged prostate with lower urinary tract symptoms: Secondary | ICD-10-CM | POA: Diagnosis not present

## 2020-05-30 DIAGNOSIS — D4111 Neoplasm of uncertain behavior of right renal pelvis: Secondary | ICD-10-CM | POA: Diagnosis not present

## 2020-05-30 DIAGNOSIS — I10 Essential (primary) hypertension: Secondary | ICD-10-CM | POA: Diagnosis not present

## 2020-05-30 DIAGNOSIS — I714 Abdominal aortic aneurysm, without rupture: Secondary | ICD-10-CM | POA: Diagnosis not present

## 2020-06-03 ENCOUNTER — Other Ambulatory Visit: Payer: Self-pay

## 2020-06-03 ENCOUNTER — Ambulatory Visit (INDEPENDENT_AMBULATORY_CARE_PROVIDER_SITE_OTHER): Payer: Self-pay | Admitting: Vascular Surgery

## 2020-06-03 ENCOUNTER — Encounter: Payer: Self-pay | Admitting: Vascular Surgery

## 2020-06-03 VITALS — BP 133/93 | HR 75 | Temp 97.6°F | Resp 18 | Ht 69.0 in | Wt 209.0 lb

## 2020-06-03 DIAGNOSIS — I714 Abdominal aortic aneurysm, without rupture, unspecified: Secondary | ICD-10-CM

## 2020-06-03 NOTE — Progress Notes (Signed)
Patient name: [name] MRN: [id number] DOB: [birth year] Sex: male  REASON FOR VISIT: Postop check after fenestrated endograft for 6 cm juxtarenal AAA  HPI: [name] is a 80 y.o. male presents for postop check after recent fenestrated endograft on 05/08/2020.  This was for 6 cm juxtarenal AAA.  Ultimately used [facility] device including bilateral small renal fenestrations and SMA scallop.  Both renal arteries were treated with VBX stents.  He did require cutdown on the left groin for repair of the common femoral artery after the closure device failed.  He reports the groin is healing up but was a bit sore initially.  Otherwise no abdominal pain or issues postprandial.  Past Medical History:  Diagnosis Date  . AAA (abdominal aortic aneurysm) ([place])   . Aneurysm ([place])   . Arthritis   . COPD (chronic obstructive pulmonary disease) ([place])   . Degenerative joint disease   . History of blood transfusion 12/2007   "bleeding for Warfarin"  . Hyperlipidemia   . Hypertension    Admitted for chest pain and 06/2011-neg stress Myoview,mod. LVH with nl EF on echo; TSH of 3.75  . Meniere's disease   . Nephrolithiasis   . Peripheral vascular disease ([place])    Small AAA (3.7-4cm 09/2010), small SMA aneurysm (84mm)  . Permanent atrial fibrillation ([place])    a. s/p Watchman  . Renal hemorrhage, right 2009   2009 while anticoagulated with Coumadin; treated with the renal artery embolization    Past Surgical History:  Procedure Laterality Date  . ABDOMINAL AORTIC ENDOVASCULAR FENESTRATED STENT GRAFT N/A 05/08/2020   Procedure: ABDOMINAL AORTIC ENDOVASCULAR FENESTRATED STENT GRAFT;  Surgeon: [name], MD;  Location: [facility];  Service: Vascular;  Laterality: N/A;  . BIOPSY  02/21/2017   Procedure: BIOPSY;  Surgeon: Danie Binder, MD;  [facility];  Service: Endoscopy;;  gastric  . COLONOSCOPY N/A 02/21/2017   multiple polyps, one requiring piecemeal removal with epi and clip  placement, multiple small and large mouthed diverticula, recto-sigmoid and sigmoid with significant excess looping, internal hemorrhoids. Path with one advanced adenoma, 3 simple adenomas, 2 hyperplastic polyps. Survelilance 3 years   . EMBOLIZATION  12/2007   Left renal hematoma, status post angiogram and embolization/notes 09/29/2010  . ESOPHAGOGASTRODUODENOSCOPY N/A 02/21/2017   benign-appearing esophageal stenosis s/p dilation, gastritis, scarred mucosa in pylorus due to prior PUD. reactive gastropathy on path  . FRACTURE SURGERY    . LEFT ATRIAL APPENDAGE OCCLUSION N/A 03/18/2016   Procedure: LEFT ATRIAL APPENDAGE OCCLUSION;  Surgeon: [name], MD;  Location: [facility];  Service: Cardiovascular;  Laterality: N/A;  . ORIF FEMUR FRACTURE Right 1960   "2 steel rods in leg"  . PATCH ANGIOPLASTY Left 05/08/2020   Procedure: PATCH ANGIOPLASTY LEFT FEMORAL ARTERY;  Surgeon: [name], MD;  Location: [facility];  Service: Vascular;  Laterality: Left;  . PILONIDAL CYST EXCISION    . POLYPECTOMY  02/21/2017   Procedure: POLYPECTOMY;  Surgeon: Danie Binder, MD;  [facility];  Service: Endoscopy;;  colon  . REPAIR ILIAC ARTERY Left 05/08/2020   Procedure: EXPLORATION AND REPAIR LEFT FEMORAL ARTERY;  Surgeon: [name], MD;  Location: [facility];  Service: Vascular;  Laterality: Left;  . SAVORY DILATION N/A 02/21/2017   Procedure: SAVORY DILATION;  Surgeon: Danie Binder, MD;  [facility];  Service: Endoscopy;  Laterality: N/A;  . TEE WITHOUT CARDIOVERSION N/A 03/10/2016   Procedure: TRANSESOPHAGEAL  ECHOCARDIOGRAM (TEE);  Surgeon: Wendall Stade, MD;  [facility] ENDOSCOPY;  Service: Cardiovascular;  Laterality: N/A;  . TEE WITHOUT CARDIOVERSION N/A 04/30/2016   Procedure: TRANSESOPHAGEAL ECHOCARDIOGRAM (TEE);  Surgeon: Wendall Stade, MD;  Location: [facility] ENDOSCOPY;  Service: Cardiovascular;  Laterality: N/A;  . ULTRASOUND GUIDANCE FOR VASCULAR ACCESS  Bilateral 05/08/2020   Procedure: ULTRASOUND GUIDANCE FOR VASCULAR ACCESS, bilateral femoral arteries;  Surgeon: Cephus Shelling, MD;  [facility];  Service: Vascular;  Laterality: Bilateral;  . Watchman procedure  02/2016    Family History  Problem Relation Age of Onset  . Hypertension Mother   . Hyperlipidemia Mother   . Renal cancer Mother   . Atrial fibrillation Sister   . Stroke Father   . Colon cancer Cousin   . Colon polyps Neg Hx     SOCIAL HISTORY: Social History   Tobacco Use  . Smoking status: Former Smoker    Packs/day: 1.00    Years: 40.00    Pack years: 40.00    Quit date: 01/20/2008    Years since quitting: 12.3  . Smokeless tobacco: Never Used  Substance Use Topics  . Alcohol use: Not Currently    Alcohol/week: 1.0 standard drink    Types: 1 Standard drinks or equivalent per week    Comment: rare wine    Allergies  Allergen Reactions  . Statins Other (See Comments)    Muscle aches Muscle aches  . Rivaroxaban Other (See Comments)    Developed redness in both legs Developed redness in both legs    Current Outpatient Medications  Medication Sig Dispense Refill  . acetaminophen (TYLENOL) 500 MG tablet Take 1,000 mg by mouth every 8 (eight) hours as needed (pain).    Marland Kitchen aspirin EC 325 MG tablet Take 325 mg by mouth daily.    . cetirizine (ZYRTEC) 10 MG tablet Take 10 mg by mouth at bedtime.     . cholecalciferol (VITAMIN D3) 25 MCG (1000 UNIT) tablet Take 1,000 Units by mouth daily.    . clopidogrel (PLAVIX) 75 MG tablet Take 1 tablet (75 mg total) by mouth daily at 6 (six) AM. 30 tablet 2  . diltiazem (CARDIZEM CD) 240 MG 24 hr capsule Take 240 mg by mouth at bedtime.     . fluticasone (FLONASE) 50 MCG/ACT nasal spray Place 1 spray into both nostrils every morning.     Marland Kitchen LORazepam (ATIVAN) 0.5 MG tablet Take 0.5 mg by mouth at bedtime.     . meclizine (ANTIVERT) 25 MG tablet Take 25 mg by mouth 2 (two) times daily. For dizziness May take an  additional dose at [facility] if needed    . metoprolol succinate (TOPROL-XL) 25 MG 24 hr tablet Take 0.5 tablets (12.5 mg total) by mouth daily.    . Multiple Vitamin (MULTIVITAMIN) tablet Take 1 tablet by mouth daily.    . pantoprazole (PROTONIX) 40 MG tablet TAKE 1 TABLET BY MOUTH DAILY 30 MINUTES PRIOR TO BREAKFAST. (Patient taking differently: Take 40 mg by mouth daily.) 30 tablet 3  . rosuvastatin (CRESTOR) 5 MG tablet Take 5 mg by mouth every evening.     . tamsulosin (FLOMAX) 0.4 MG CAPS capsule Take 0.4 mg by mouth in the morning and at bedtime.     . triamterene-hydrochlorothiazide (MAXZIDE-25) 37.5-25 MG per tablet Take 1 tablet by mouth daily.    Marland Kitchen oxyCODONE-acetaminophen (PERCOCET/ROXICET) 5-325 MG tablet Take 1-2 tablets by mouth every 4 (four) hours as needed for moderate pain. 15 tablet  0   No current facility-administered medications for this visit.    REVIEW OF SYSTEMS:  [X]  denotes positive finding, [ ]  denotes negative finding Cardiac  Comments:  Chest pain or chest pressure:    Shortness of breath upon exertion:    Short of breath when lying flat:    Irregular heart rhythm:        Vascular    Pain in calf, thigh, or hip brought on by ambulation:    Pain in feet at night that wakes you up from your sleep:     Blood clot in your veins:    Leg swelling:         Pulmonary    Oxygen at home:    Productive cough:     Wheezing:         Neurologic    Sudden weakness in arms or legs:     Sudden numbness in arms or legs:     Sudden onset of difficulty speaking or slurred speech:    Temporary loss of vision in one eye:     Problems with dizziness:         Gastrointestinal    Blood in stool:     Vomited blood:         Genitourinary    Burning when urinating:     Blood in urine:        Psychiatric    Major depression:         Hematologic    Bleeding problems:    Problems with blood clotting too easily:        Skin    Rashes or ulcers:        Constitutional     Fever or chills:      PHYSICAL EXAM: Vitals:   06/03/20 1504  BP: (!) 133/93  Pulse: 75  Resp: 18  Temp: 97.6 F (36.4 C)  TempSrc: Temporal  SpO2: 90%  Weight: 209 lb (94.8 kg)  Height: 5\' 9"  (1.753 m)    GENERAL: The patient is a well-nourished male, in no acute distress. The vital signs are documented above. CARDIAC: There is a regular rate and rhythm.  VASCULAR:  Bilateral femoral pulses palpable Left groin incision healing Bilateral PT pulses palpable  DATA:   CTA chest abdomen pelvis on my review from 05/29/20 shows fenestrated endograft at the level of the SMA and renal arteries with excellent seal proximally and distally.  There is no evidence of endoleak.  Aneurysm appears appropriately excluded.  Assessment/Plan:  80 year old male status post fenestrated endograft using the [facility] Zfen device with 2 small renal fenestrations and SMA scallop on 05/08/2020.  I reviewed his CTA from 05/29/2020 and he appears to have excellent seal proximally distally with no evidence of endoleak.  Very satisfied with his results.  His left groin incision appears to be healing from cutdown.  I will have him follow-up in 6 months with EVAR duplex here in the office and bilateral renal artery duplex.  Discussed with him and his wife I would recommend dual endplate therapy with baby 81 mg aspirin and Plavix if able.   [name], MD [facility] [place] Office: [phone]

## 2020-06-04 ENCOUNTER — Other Ambulatory Visit: Payer: Self-pay

## 2020-06-04 DIAGNOSIS — I714 Abdominal aortic aneurysm, without rupture, unspecified: Secondary | ICD-10-CM

## 2020-06-25 ENCOUNTER — Ambulatory Visit (INDEPENDENT_AMBULATORY_CARE_PROVIDER_SITE_OTHER): Payer: PPO | Admitting: Nurse Practitioner

## 2020-06-25 ENCOUNTER — Encounter: Payer: Self-pay | Admitting: Nurse Practitioner

## 2020-06-25 ENCOUNTER — Other Ambulatory Visit: Payer: Self-pay

## 2020-06-25 DIAGNOSIS — D485 Neoplasm of uncertain behavior of skin: Secondary | ICD-10-CM | POA: Diagnosis not present

## 2020-06-25 DIAGNOSIS — I482 Chronic atrial fibrillation, unspecified: Secondary | ICD-10-CM | POA: Diagnosis not present

## 2020-06-25 DIAGNOSIS — R7301 Impaired fasting glucose: Secondary | ICD-10-CM | POA: Diagnosis not present

## 2020-06-25 DIAGNOSIS — Z7689 Persons encountering health services in other specified circumstances: Secondary | ICD-10-CM

## 2020-06-25 DIAGNOSIS — Z0001 Encounter for general adult medical examination with abnormal findings: Secondary | ICD-10-CM | POA: Insufficient documentation

## 2020-06-25 DIAGNOSIS — G4489 Other headache syndrome: Secondary | ICD-10-CM | POA: Diagnosis not present

## 2020-06-25 DIAGNOSIS — I10 Essential (primary) hypertension: Secondary | ICD-10-CM

## 2020-06-25 DIAGNOSIS — Z139 Encounter for screening, unspecified: Secondary | ICD-10-CM | POA: Diagnosis not present

## 2020-06-25 DIAGNOSIS — L988 Other specified disorders of the skin and subcutaneous tissue: Secondary | ICD-10-CM | POA: Diagnosis not present

## 2020-06-25 DIAGNOSIS — E785 Hyperlipidemia, unspecified: Secondary | ICD-10-CM

## 2020-06-25 DIAGNOSIS — R519 Headache, unspecified: Secondary | ICD-10-CM | POA: Insufficient documentation

## 2020-06-25 NOTE — Patient Instructions (Signed)
It was great seeing you today.  For your headache, we sent in a referral to [facility].  We will meet back up in about 3 months for a lab follow-up. Please have fasting labs drawn 2-3 days prior to your appointment.

## 2020-06-25 NOTE — Assessment & Plan Note (Signed)
-  BP slightly elevated at 142/80 today -continue diltizazem and metoprolol -continue triamterene-HCTZ

## 2020-06-25 NOTE — Assessment & Plan Note (Signed)
-  will screen for HCV with next set of labs

## 2020-06-25 NOTE — Progress Notes (Signed)
New Patient Office Visit  Subjective:  Patient ID: KEYLEN ECKENRODE, male    DOB: 1941-01-27  Age: 80 y.o. MRN: [id number]  CC:  Chief Complaint  Patient presents with  . New Patient (Initial Visit)    Here to establish care, has a few complaints today. Has pain on the L side of his forehead right up under the skin, feels tender to touch. This has been ongoing for a year.    HPI [name] presents for new patient visit. Transferring care from Dr. Nevada Crane and Dr. Owens Shark with the [facility] in [place]. Last physical was over a year ago. Last labs were drawn Dec 2021.  He would like a COVID booster shot. His last COVID shot was in April   He is having pain in his forehead. He states he has headaches that last 3-4 hours and he has to lay down when he has these headaches.  He states that 5 years ago he fell and had a concussion and a brain bleed that resolved on its own. He states the headaches started 3 years later, and have been ongoing for a year.  He states that generally the pain is on his left side.  He has light sensitivity on days when his headache is severe. He uses afrin when he has a headache, but it helping a little.  He saw dermatology who froze a lesion on his forehead, but that was about a year ago.  He had AAA repair in December 2021.  Past Medical History:  Diagnosis Date  . AAA (abdominal aortic aneurysm) ([place])   . Aneurysm ([place])   . Arthritis   . COPD (chronic obstructive pulmonary disease) ([place])   . Degenerative joint disease   . History of blood transfusion 12/2007   "bleeding for Warfarin"  . Hyperlipidemia   . Hypertension    Admitted for chest pain and 06/2011-neg stress Myoview,mod. LVH with nl EF on echo; TSH of 3.75  . Meniere's disease   . Nephrolithiasis   . 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 Permanent atrial fibrillation ([place])    a. s/p Watchman  . Renal hemorrhage, right 2009   2009 while  anticoagulated with Coumadin; treated with the renal artery embolization    Past Surgical History:  Procedure Laterality Date  . ABDOMINAL AORTIC ENDOVASCULAR FENESTRATED STENT GRAFT N/A 05/08/2020   Procedure: ABDOMINAL AORTIC ENDOVASCULAR FENESTRATED STENT GRAFT;  Surgeon: [name], MD;  Location: [place];  Service: Vascular;  Laterality: N/A;  . BIOPSY  02/21/2017   Procedure: BIOPSY;  Surgeon: Danie Binder, MD;  [facility];  Service: Endoscopy;;  gastric  . COLONOSCOPY N/A 02/21/2017   multiple polyps, one requiring piecemeal removal with epi and clip placement, multiple small and large mouthed diverticula, recto-sigmoid and sigmoid with significant excess looping, internal hemorrhoids. Path with one advanced adenoma, 3 simple adenomas, 2 hyperplastic polyps. Survelilance 3 years   . EMBOLIZATION  12/2007   Left renal hematoma, status post angiogram and embolization/notes 09/29/2010  . ESOPHAGOGASTRODUODENOSCOPY N/A 02/21/2017   benign-appearing esophageal stenosis s/p dilation, gastritis, scarred mucosa in pylorus due to prior PUD. reactive gastropathy on path  . FRACTURE SURGERY    . LEFT ATRIAL APPENDAGE OCCLUSION N/A 03/18/2016   Procedure: LEFT ATRIAL APPENDAGE OCCLUSION;  Surgeon: [name], MD;  Location: [place] [facility];  Service: Cardiovascular;  Laterality: N/A;  . ORIF FEMUR FRACTURE Right 1960   "  2 steel rods in leg"  . PATCH ANGIOPLASTY Left 05/08/2020   Procedure: PATCH ANGIOPLASTY LEFT FEMORAL ARTERY;  Surgeon: [name], MD;  Location: [place];  Service: Vascular;  Laterality: Left;  . PILONIDAL CYST EXCISION    . POLYPECTOMY  02/21/2017   Procedure: POLYPECTOMY;  Surgeon: Danie Binder, MD;  [facility];  Service: Endoscopy;;  colon  . REPAIR ILIAC ARTERY Left 05/08/2020   Procedure: EXPLORATION AND REPAIR LEFT FEMORAL ARTERY;  Surgeon: [name], MD;  Location: [place];  Service: Vascular;  Laterality: Left;  .  SAVORY DILATION N/A 02/21/2017   Procedure: SAVORY DILATION;  Surgeon: Danie Binder, MD;  [facility];  Service: Endoscopy;  Laterality: N/A;  . TEE WITHOUT CARDIOVERSION N/A 03/10/2016   Procedure: TRANSESOPHAGEAL ECHOCARDIOGRAM (TEE);  Surgeon: Josue Hector, MD;  Location: [place];  Service: Cardiovascular;  Laterality: N/A;  . TEE WITHOUT CARDIOVERSION N/A 04/30/2016   Procedure: TRANSESOPHAGEAL ECHOCARDIOGRAM (TEE);  Surgeon: Josue Hector, MD;  Location: [facility];  Service: Cardiovascular;  Laterality: N/A;  . ULTRASOUND GUIDANCE FOR VASCULAR ACCESS Bilateral 05/08/2020   Procedure: ULTRASOUND GUIDANCE FOR VASCULAR ACCESS, bilateral femoral arteries;  Surgeon: [name], MD;  Location: [facility];  Service: Vascular;  Laterality: Bilateral;  . Watchman procedure  02/2016    Family History  Problem Relation Age of Onset  . Hypertension Mother   . Hyperlipidemia Mother   . Renal cancer Mother   . Atrial fibrillation Sister   . Stroke Father   . Colon cancer Cousin   . Colon polyps Neg Hx     Social History   Socioeconomic History  . Marital status: Married    Spouse name: Not on file  . Number of children: 2  . Years of education: Not on file  . Highest education level: Not on file  Occupational History  . Occupation: Retired    Comment: Chief Technology Officer job  Tobacco Use  . Smoking status: Former Smoker    Packs/day: 1.00    Years: 40.00    Pack years: 40.00    Quit date: 01/20/2008    Years since quitting: 12.4  . Smokeless tobacco: Never Used  Substance and Sexual Activity  . Alcohol use: Not Currently    Alcohol/week: 1.0 standard drink    Types: 1 Standard drinks or equivalent per week    Comment: rare wine  . Drug use: No  . Sexual activity: Yes  Other Topics Concern  . Not on file  Social History Narrative   Retired from Architect   Lives in [place]  Strain: Not on file  Food Insecurity: Not on file  Transportation Needs: Not on file  Physical Activity: Not on file  Stress: Not on file  Social Connections: Not on file  Intimate Partner Violence: Not on file    ROS Review of Systems  Constitutional: Negative.   Respiratory: Negative.   Cardiovascular: Negative.   Neurological: Positive for headaches.  Psychiatric/Behavioral: Negative.     Objective:   Today's Vitals: BP (!) 142/80 (BP Location: Right Arm, Patient Position: Sitting, Cuff Size: Normal)   Pulse 75   Temp (!) 97.5 F (36.4 C) (Temporal)   Ht 5\' 9"  (1.753 m)   Wt 218 lb (98.9 kg)   SpO2 99%   BMI 32.19 kg/m   Physical Exam Constitutional:      Appearance: He is obese.  Cardiovascular:     Rate and Rhythm: Normal rate. Rhythm irregular.     Pulses: Normal pulses.     Heart sounds: Normal heart sounds.  Pulmonary:     Effort: Pulmonary effort is normal.     Breath sounds: Normal breath sounds.  Neurological:     Mental Status: He is alert and oriented to person, place, and time. Mental status is at baseline.     Cranial Nerves: No cranial nerve deficit.     Sensory: No sensory deficit.     Motor: No weakness.     Gait: Gait normal.     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  Psychiatric:        Mood and Affect: Mood normal.        Behavior: Behavior normal.        Thought Content: Thought content normal.        Judgment: Judgment normal.     Assessment & Plan:   Problem List Items Addressed This Visit   None     Outpatient Encounter Medications as of 06/25/2020  Medication Sig  . acetaminophen (TYLENOL) 500 MG tablet Take 1,000 mg by mouth every 8 (eight) hours as needed (pain).  . cetirizine (ZYRTEC) 10 MG tablet Take 10 mg by mouth at bedtime.   . cholecalciferol (VITAMIN D3) 25 MCG (1000 UNIT) tablet Take 1,000 Units by mouth daily.  . clopidogrel (PLAVIX) 75 MG tablet Take 1 tablet (75 mg total) by  mouth daily at 6 (six) AM.  . diltiazem (CARDIZEM CD) 240 MG 24 hr capsule Take 240 mg by mouth at bedtime.   . fluticasone (FLONASE) 50 MCG/ACT nasal spray Place 1 spray into both nostrils every morning.   Marland Kitchen LORazepam (ATIVAN) 0.5 MG tablet Take 0.5 mg by mouth at bedtime.   . meclizine (ANTIVERT) 25 MG tablet Take 25 mg by mouth 2 (two) times daily. For dizziness May take an additional dose at [facility] if needed  . metoprolol succinate (TOPROL-XL) 25 MG 24 hr tablet Take 0.5 tablets (12.5 mg total) by mouth daily.  . Multiple Vitamin (MULTIVITAMIN) tablet Take 1 tablet by mouth daily.  . pantoprazole (PROTONIX) 40 MG tablet TAKE 1 TABLET BY MOUTH DAILY 30 MINUTES PRIOR TO BREAKFAST. (Patient taking differently: Take 40 mg by mouth daily.)  . rosuvastatin (CRESTOR) 5 MG tablet Take 5 mg by mouth every evening.   . tamsulosin (FLOMAX) 0.4 MG CAPS capsule Take 0.4 mg by mouth in the morning and at bedtime.   . triamterene-hydrochlorothiazide (MAXZIDE-25) 37.5-25 MG per tablet Take 1 tablet by mouth daily.  Marland Kitchen aspirin EC 325 MG tablet Take 325 mg by mouth daily.  Marland Kitchen oxyCODONE-acetaminophen (PERCOCET/ROXICET) 5-325 MG tablet Take 1-2 tablets by mouth every 4 (four) hours as needed for moderate pain.   No facility-administered encounter medications on file as of 06/25/2020.    Follow-up: No follow-ups on file.   Noreene Larsson, NP

## 2020-06-25 NOTE — Assessment & Plan Note (Addendum)
-  followed by cardiology -had AAA repair and was started on plavix; no anticoagulation for a-fib currently -on metoprolol and diltiazem for rate control

## 2020-06-25 NOTE — Assessment & Plan Note (Addendum)
-  symptoms consistent with migraine (has photosensitivity, unilateral [generally left worse than right]) -unusual for migraine to develop at this stage -he was previously instructed to use afrin, but he states it does not fully resolve his headache -refer to neuro for work-up

## 2020-06-25 NOTE — Assessment & Plan Note (Signed)
-  request records from [name], MD and [place]

## 2020-06-25 NOTE — Assessment & Plan Note (Signed)
-  will check A1c with next set of labs

## 2020-06-25 NOTE — Assessment & Plan Note (Signed)
-  taking rosuvastatin 5 mg qPM

## 2020-07-08 ENCOUNTER — Telehealth: Payer: Self-pay

## 2020-07-08 NOTE — Telephone Encounter (Signed)
Diltiazem  240 mg 24 hr capsule   Please send to [facility]

## 2020-07-09 ENCOUNTER — Other Ambulatory Visit: Payer: Self-pay

## 2020-07-09 DIAGNOSIS — I482 Chronic atrial fibrillation, unspecified: Secondary | ICD-10-CM

## 2020-07-09 MED ORDER — DILTIAZEM HCL ER COATED BEADS 240 MG PO CP24: 240.0000 mg | ORAL_CAPSULE | Freq: Every day | ORAL | 3 refills | Status: DC

## 2020-07-09 NOTE — Telephone Encounter (Signed)
Rx refilled.

## 2020-07-10 ENCOUNTER — Other Ambulatory Visit: Payer: PPO

## 2020-07-10 DIAGNOSIS — Z20822 Contact with and (suspected) exposure to covid-19: Secondary | ICD-10-CM | POA: Diagnosis not present

## 2020-07-11 LAB — SARS-COV-2, NAA 2 DAY TAT

## 2020-07-11 LAB — SPECIMEN STATUS REPORT

## 2020-07-11 LAB — NOVEL CORONAVIRUS, NAA: SARS-CoV-2, NAA: DETECTED — AB

## 2020-07-12 ENCOUNTER — Telehealth: Payer: Self-pay | Admitting: Physician Assistant

## 2020-07-12 NOTE — Telephone Encounter (Signed)
Called to discuss with patient about COVID-19 symptoms and the use of one of the available treatments for those with mild to moderate Covid symptoms and at a high risk of hospitalization.  Pt appears to qualify for outpatient treatment due to co-morbid conditions and/or a member of an at-risk group in accordance with the FDA Emergency Use Authorization.    Symptom onset: 2/9 Vaccinated: Yes Booster? No Immunocompromised? No Qualifiers: age, HTN, PAF, AAA   Pt is qualified for the monoclonal antibody infusion or antiviral tx, but patient declined either one. Preventative practices reviewed. Patient verbalized understanding.  [place], [place]  07/12/2020 9:25 AM

## 2020-07-15 ENCOUNTER — Encounter: Payer: Self-pay | Admitting: Nurse Practitioner

## 2020-07-15 ENCOUNTER — Telehealth (INDEPENDENT_AMBULATORY_CARE_PROVIDER_SITE_OTHER): Payer: PPO | Admitting: Nurse Practitioner

## 2020-07-15 ENCOUNTER — Other Ambulatory Visit: Payer: Self-pay

## 2020-07-15 DIAGNOSIS — U071 COVID-19: Secondary | ICD-10-CM | POA: Diagnosis not present

## 2020-07-15 MED ORDER — BENZONATATE 100 MG PO CAPS: 100.0000 mg | ORAL_CAPSULE | Freq: Two times a day (BID) | ORAL | 0 refills | Status: DC | PRN

## 2020-07-15 MED ORDER — NOREL AD 4-10-325 MG PO TABS: 1.0000 | ORAL_TABLET | ORAL | 1 refills | Status: DC | PRN

## 2020-07-15 NOTE — Assessment & Plan Note (Signed)
-  states symptoms started 5 days ago, and are improving -Rx. Tessalon -Rx. norel

## 2020-07-15 NOTE — Progress Notes (Signed)
Acute Office Visit  Subjective:    Patient ID: [name], male    DOB: 09/06/1940, 80 y.o.   MRN: [id number]  Chief Complaint  Patient presents with  . Covid Positive    Today is day 5 for patient. Having a lot of sinus congestion.  . Cough    Productive cough, ongoing x5 days.    HPI Patient is in today for COVID-19. He tested positive 5 days ago. He has been taking tylenol, and he states he is feeling somewhat better.  Past Medical History:  Diagnosis Date  . AAA (abdominal aortic aneurysm) ([place])   . AKI (acute kidney injury) ([place]) 04/04/2016  . Aneurysm ([place])   . 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 Chest pain 07/17/2011   Not clearly of cardiac origin.  Will rx empirically with a PPI and assess response.  Admitted to [facility] in 06/2011; echo-moderate LVH, normal EF, mild aortic root dilatation; stress nuclear-normal perfusion, normal EF.   Marland Kitchen COPD (chronic obstructive pulmonary disease) ([place])   . Degenerative joint disease    had disc fusion in the past  . Hearing deficit, bilateral    left worse than right  . History of blood transfusion 12/2007   "bleeding for Warfarin"  . Hyperlipidemia   . Hypertension    Admitted for chest pain and 06/2011-neg stress Myoview,mod. LVH with nl EF on echo; TSH of 3.75  . Hyponatremia 04/04/2016  . Meniere's disease   . Nephrolithiasis   . Peripheral vascular disease ([place])    Small AAA (3.7-4cm 09/2010), small SMA aneurysm (38mm)  . Permanent atrial fibrillation ([place])    a. s/p Watchman  . Renal hemorrhage, right 2009   2009 while anticoagulated with Coumadin; treated with the renal artery embolization  . Renal hemorrhage, right    2009 while anticoagulated with Coumadin; treated with the renal artery embolization   . Sepsis ([place]) 04/04/2016    Past Surgical History:  Procedure Laterality Date  . ABDOMINAL AORTIC ENDOVASCULAR FENESTRATED STENT GRAFT N/A 05/08/2020   Procedure: ABDOMINAL AORTIC ENDOVASCULAR FENESTRATED STENT GRAFT;   Surgeon: [name], MD;  Location: [place];  Service: Vascular;  Laterality: N/A;  . BIOPSY  02/21/2017   Procedure: BIOPSY;  Surgeon: Danie Binder, MD;  [facility];  Service: Endoscopy;;  gastric  . COLONOSCOPY N/A 02/21/2017   multiple polyps, one requiring piecemeal removal with epi and clip placement, multiple small and large mouthed diverticula, recto-sigmoid and sigmoid with significant excess looping, internal hemorrhoids. Path with one advanced adenoma, 3 simple adenomas, 2 hyperplastic polyps. Survelilance 3 years   . EMBOLIZATION  12/2007   Left renal hematoma, status post angiogram and embolization/notes 09/29/2010  . ESOPHAGOGASTRODUODENOSCOPY N/A 02/21/2017   benign-appearing esophageal stenosis s/p dilation, gastritis, scarred mucosa in pylorus due to prior PUD. reactive gastropathy on path  . FRACTURE SURGERY    . LEFT ATRIAL APPENDAGE OCCLUSION N/A 03/18/2016   Procedure: LEFT ATRIAL APPENDAGE OCCLUSION;  Surgeon: [name], MD;  Location: [place] [facility];  Service: Cardiovascular;  Laterality: N/A;  . ORIF FEMUR FRACTURE Right 1960   "2 steel rods in leg"  . PATCH ANGIOPLASTY Left 05/08/2020   Procedure: PATCH ANGIOPLASTY LEFT FEMORAL ARTERY;  Surgeon: [name], MD;  Location: [place];  Service: Vascular;  Laterality: Left;  . PILONIDAL CYST EXCISION    . POLYPECTOMY  02/21/2017   Procedure: POLYPECTOMY;  Surgeon: Danie Binder, MD;  [facility];  Service: Endoscopy;;  colon  . REPAIR ILIAC ARTERY Left 05/08/2020   Procedure: EXPLORATION AND REPAIR LEFT FEMORAL ARTERY;  Surgeon: [name], MD;  Location: [place];  Service: Vascular;  Laterality: Left;  . SAVORY DILATION N/A 02/21/2017   Procedure: SAVORY DILATION;  Surgeon: Danie Binder, MD;  [facility];  Service: Endoscopy;  Laterality: N/A;  . TEE WITHOUT CARDIOVERSION N/A 03/10/2016   Procedure: TRANSESOPHAGEAL ECHOCARDIOGRAM (TEE);  Surgeon: Josue Hector, MD;  Location: [place];  Service: Cardiovascular;  Laterality: N/A;  . TEE WITHOUT CARDIOVERSION N/A 04/30/2016   Procedure: TRANSESOPHAGEAL ECHOCARDIOGRAM (TEE);  Surgeon: Josue Hector, MD;  Location: [facility] ENDOS[facility];  Service: Cardiovascular;  Laterality: N/A;  . ULTRASOUND GUIDANCE FOR VASCULAR ACCESS Bilateral 05/08/2020   Procedure: ULTRASOUND GUIDANCE FOR VASCULAR ACCESS, bilateral femoral arteries;  Surgeon: [name], MD;  Location: [place];  Service: Vascular;  Laterality: Bilateral;  . Watchman procedure  02/2016    Family History  Problem Relation Age of Onset  . Hypertension Mother   . Hyperlipidemia Mother   . Renal cancer Mother   . Atrial fibrillation Sister   . Stroke Father   . Colon cancer Cousin   . Colon polyps Neg Hx     Social History   Socioeconomic History  . Marital status: Married    Spouse name: Not on file  . Number of children: 2  . Years of education: Not on file  . Highest education level: Not on file  Occupational History  . Occupation: Retired    Comment: Chief Technology Officer job  Tobacco Use  . Smoking status: Former Smoker    Packs/day: 1.00    Years: 40.00    Pack years: 40.00    Quit date: 01/20/2008    Years since quitting: 12.4  . Smokeless tobacco: Never Used  Vaping Use  . Vaping Use: Never used  Substance and Sexual Activity  . Alcohol use: Not Currently    Alcohol/week: 1.0 standard drink    Types: 1 Standard drinks or equivalent per week    Comment: rare wine  . Drug use: No  . Sexual activity: Yes  Other Topics Concern  . Not on file  Social History Narrative   Retired from [employer]   Lives in [place] Strain: Not on file  Food Insecurity: Not on file  Transportation Needs: Not on file  Physical Activity: Not on file  Stress: Not on file  Social Connections: Not on file  Intimate Partner Violence: Not on file    Outpatient  Medications Prior to Visit  Medication Sig Dispense Refill  . acetaminophen (TYLENOL) 500 MG tablet Take 1,000 mg by mouth every 8 (eight) hours as needed (pain).    . cetirizine (ZYRTEC) 10 MG tablet Take 10 mg by mouth at bedtime.     . cholecalciferol (VITAMIN D3) 25 MCG (1000 UNIT) tablet Take 1,000 Units by mouth daily.    . clopidogrel (PLAVIX) 75 MG tablet Take 1 tablet (75 mg total) by mouth daily at 6 (six) AM. 30 tablet 2  . diltiazem (CARDIZEM CD) 240 MG 24 hr capsule Take 1 capsule (240 mg total) by mouth at bedtime. 30 capsule 3  . fluticasone (FLONASE) 50 MCG/ACT nasal spray Place 1 spray into both nostrils every morning.     Marland Kitchen LORazepam (ATIVAN) 0.5 MG tablet Take 0.5 mg by mouth at bedtime.     . meclizine (  ANTIVERT) 25 MG tablet Take 25 mg by mouth 2 (two) times daily. For dizziness May take an additional dose at [facility] if needed    . metoprolol succinate (TOPROL-XL) 25 MG 24 hr tablet Take 0.5 tablets (12.5 mg total) by mouth daily.    . Multiple Vitamin (MULTIVITAMIN) tablet Take 1 tablet by mouth daily.    . pantoprazole (PROTONIX) 40 MG tablet TAKE 1 TABLET BY MOUTH DAILY 30 MINUTES PRIOR TO BREAKFAST. (Patient taking differently: Take 40 mg by mouth daily.) 30 tablet 3  . rosuvastatin (CRESTOR) 5 MG tablet Take 5 mg by mouth every evening.     . tamsulosin (FLOMAX) 0.4 MG CAPS capsule Take 0.4 mg by mouth in the morning and at bedtime.     . triamterene-hydrochlorothiazide (MAXZIDE-25) 37.5-25 MG per tablet Take 1 tablet by mouth daily.     No facility-administered medications prior to visit.    Allergies  Allergen Reactions  . Other Swelling  . Statins Other (See Comments)    Muscle aches Muscle aches  . Rivaroxaban Other (See Comments)    Developed redness in both legs Developed redness in both legs    Review of Systems  Constitutional: Positive for fatigue. Negative for chills and fever.  HENT: Positive for congestion and sore throat. Negative for rhinorrhea,  sinus pressure and sinus pain.   Respiratory: Positive for cough, shortness of breath and wheezing.   Cardiovascular: Negative.        Objective:    Physical Exam  Ht 5\' 9"  (1.753 m)   Wt 215 lb (97.5 kg)   BMI 31.75 kg/m  Wt Readings from Last 3 Encounters:  07/15/20 215 lb (97.5 kg)  06/25/20 218 lb (98.9 kg)  06/03/20 209 lb (94.8 kg)    Health Maintenance Due  Topic Date Due  . Hepatitis C Screening  Never done  . TETANUS/TDAP  11/23/2008  . PNA vac Low Risk Adult (2 of 2 - PCV13) 09/28/2018    There are no preventive care reminders to display for this patient.   Lab Results  Component Value Date   TSH 3.758 06/19/2012   Lab Results  Component Value Date   WBC 10.2 05/09/2020   HGB 10.1 (L) 05/09/2020   HCT 29.5 (L) 05/09/2020   MCV 86.5 05/09/2020   PLT 133 (L) 05/09/2020   Lab Results  Component Value Date   NA 137 05/09/2020   K 3.7 05/09/2020   CO2 25 05/09/2020   GLUCOSE 158 (H) 05/09/2020   BUN 14 05/09/2020   CREATININE 1.11 05/09/2020   BILITOT 1.3 (H) 05/05/2020   ALKPHOS 32 (L) 05/05/2020   AST 28 05/05/2020   ALT 18 05/05/2020   PROT 7.0 05/05/2020   ALBUMIN 3.8 05/05/2020   CALCIUM 8.3 (L) 05/09/2020   ANIONGAP 10 05/09/2020   Lab Results  Component Value Date   CHOL 161 12/08/2012   Lab Results  Component Value Date   HDL 37 (L) 12/08/2012   Lab Results  Component Value Date   LDLCALC 91 12/08/2012   Lab Results  Component Value Date   TRIG 165 (H) 12/08/2012   Lab Results  Component Value Date   CHOLHDL 4.4 12/08/2012   No results found for: HGBA1C     Assessment & Plan:   Problem List Items Addressed This Visit      Other   COVID-19    -states symptoms started 5 days ago, and are improving -Rx. Tessalon -Rx. norel  Meds ordered this encounter  Medications  . Chlorphen-PE-Acetaminophen (NOREL AD) 4-10-325 MG TABS    Sig: Take 1 tablet by mouth every 4 (four) hours as needed (nasal  congestion, cold symptoms).    Dispense:  20 tablet    Refill:  1  . benzonatate (TESSALON) 100 MG capsule    Sig: Take 1 capsule (100 mg total) by mouth 2 (two) times daily as needed for cough.    Dispense:  20 capsule    Refill:  0   Date:  07/15/2020   Location of Patient: Home Location of Provider: Office Consent was obtain for visit to be over via telehealth. I verified that I am speaking with the correct person using two identifiers.  I connected with  Joaquin Music on 07/15/20 via telephone and verified that I am speaking with the correct person using two identifiers.   I discussed the limitations of evaluation and management by telemedicine. The patient expressed understanding and agreed to proceed.  Time spent: 9 min  Noreene Larsson, NP

## 2020-07-16 ENCOUNTER — Telehealth: Payer: Self-pay

## 2020-07-16 NOTE — Telephone Encounter (Signed)
Norel is a combination pill with an antihistamine, tylenol, and the decongestant, phenylephrine. Psuedophed PE has phenylephrine, and azelastine is a good nasal antihistamine that doesn't cause drowsiness. If the combo pill isn't available, I'd use those 2 medications.

## 2020-07-16 NOTE — Telephone Encounter (Signed)
Pt called and states the norel AD that was ordered is on backorder nationwide. Wants to know what else you recommend he take in its place?

## 2020-07-16 NOTE — Telephone Encounter (Signed)
Pt informed

## 2020-07-24 ENCOUNTER — Other Ambulatory Visit: Payer: Self-pay

## 2020-07-24 ENCOUNTER — Telehealth: Payer: Self-pay

## 2020-07-24 DIAGNOSIS — K2970 Gastritis, unspecified, without bleeding: Secondary | ICD-10-CM

## 2020-07-24 MED ORDER — PANTOPRAZOLE SODIUM 40 MG PO TBEC: DELAYED_RELEASE_TABLET | ORAL | 3 refills | Status: DC

## 2020-07-24 NOTE — Telephone Encounter (Signed)
Rx called in 

## 2020-07-24 NOTE — Telephone Encounter (Signed)
Please send in  Pantoprazole to the [facility]  90 day supply

## 2020-07-26 ENCOUNTER — Other Ambulatory Visit: Payer: Self-pay

## 2020-07-26 ENCOUNTER — Encounter: Payer: Self-pay | Admitting: Emergency Medicine

## 2020-07-26 ENCOUNTER — Ambulatory Visit
Admission: EM | Admit: 2020-07-26 | Discharge: 2020-07-26 | Disposition: A | Discharge status: Home / Self Care | Payer: PPO | Attending: Emergency Medicine | Admitting: Emergency Medicine

## 2020-07-26 DIAGNOSIS — B948 Sequelae of other specified infectious and parasitic diseases: Secondary | ICD-10-CM

## 2020-07-26 DIAGNOSIS — R0600 Dyspnea, unspecified: Secondary | ICD-10-CM

## 2020-07-26 DIAGNOSIS — U099 Post covid-19 condition, unspecified: Secondary | ICD-10-CM

## 2020-07-26 DIAGNOSIS — U071 COVID-19: Secondary | ICD-10-CM | POA: Diagnosis not present

## 2020-07-26 MED ORDER — ALBUTEROL SULFATE HFA 108 (90 BASE) MCG/ACT IN AERS: 1.0000 | INHALATION_SPRAY | Freq: Four times a day (QID) | RESPIRATORY_TRACT | 1 refills | Status: AC | PRN

## 2020-07-26 MED ORDER — DEXAMETHASONE 4 MG PO TABS: 4.0000 mg | ORAL_TABLET | Freq: Every day | ORAL | 0 refills | Status: DC

## 2020-07-26 NOTE — Discharge Instructions (Signed)
Get plenty of rest and push fluids Continue zyrtec for nasal congestion Continue flonase for nasal congestion and runny nose Decadron was prescribed ProAir was prescribed Use OTC medications like ibuprofen or tylenol as needed fever or pain Call or go to the ED if you have any new or worsening symptoms such as fever, worsening cough, shortness of breath, chest tightness, chest pain, turning blue, changes in mental status, etc..Marland Kitchen

## 2020-07-26 NOTE — ED Triage Notes (Signed)
Pt started having covid s/s on 07/14/2020 and tested positive.  Pt is now having nasal congestion that is causing him to not be able to sleep.  Was given Flonase by his pcp.  Pt also reports some intermittent shortness of breath that is worse with activity.

## 2020-07-26 NOTE — ED Provider Notes (Signed)
[place]   [id number] 07/26/20 Arrival Time: 0905   CC: COVID   SUBJECTIVE: History from: patient.  [name] is a 80 y.o. male who presented to the urgent care with a complaint of nasal congestion and shortness of breath and being Covid positive on July 14, 2020.  Denies sick exposure to flu or strep.  Denies recent travel.  Has tried Flonase without relief.  Denies alleviating or aggravating factors.  Denies previous symptoms in the past.   Denies fever, chills, fatigue, sinus pain, rhinorrhea, sore throat, SOB, wheezing, chest pain, nausea, changes in bowel or bladder habits.    ROS: As per HPI.  All other pertinent ROS negative.      Past Medical History:  Diagnosis Date  . AAA (abdominal aortic aneurysm) ([place])   . AKI (acute kidney injury) ([place]) 04/04/2016  . Aneurysm ([place])   . 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 Chest pain 07/17/2011   Not clearly of cardiac origin.  Will rx empirically with a PPI and assess response.  Admitted to [facility] in 06/2011; echo-moderate LVH, normal EF, mild aortic root dilatation; stress nuclear-normal perfusion, normal EF.   Marland Kitchen COPD (chronic obstructive pulmonary disease) ([place])   . Degenerative joint disease    had disc fusion in the past  . Hearing deficit, bilateral    left worse than right  . History of blood transfusion 12/2007   "bleeding for Warfarin"  . Hyperlipidemia   . Hypertension    Admitted for chest pain and 06/2011-neg stress Myoview,mod. LVH with nl EF on echo; TSH of 3.75  . Hyponatremia 04/04/2016  . Meniere's disease   . Nephrolithiasis   . 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 Permanent atrial fibrillation ([place])    a. s/p Watchman  . Renal hemorrhage, right 2009   2009 while anticoagulated with Coumadin; treated with the renal artery embolization  . Renal hemorrhage, right    2009 while anticoagulated with Coumadin; treated with the renal artery  embolization   . Sepsis ([place]) 04/04/2016   Past Surgical History:  Procedure Laterality Date  . ABDOMINAL AORTIC ENDOVASCULAR FENESTRATED STENT GRAFT N/A 05/08/2020   Procedure: ABDOMINAL AORTIC ENDOVASCULAR FENESTRATED STENT GRAFT;  Surgeon: [name], MD;  Location: [place];  Service: Vascular;  Laterality: N/A;  . BIOPSY  02/21/2017   Procedure: BIOPSY;  Surgeon: Danie Binder, MD;  [facility];  Service: Endoscopy;;  gastric  . COLONOSCOPY N/A 02/21/2017   multiple polyps, one requiring piecemeal removal with epi and clip placement, multiple small and large mouthed diverticula, recto-sigmoid and sigmoid with significant excess looping, internal hemorrhoids. Path with one advanced adenoma, 3 simple adenomas, 2 hyperplastic polyps. Survelilance 3 years   . EMBOLIZATION  12/2007   Left renal hematoma, status post angiogram and embolization/notes 09/29/2010  . ESOPHAGOGASTRODUODENOSCOPY N/A 02/21/2017   benign-appearing esophageal stenosis s/p dilation, gastritis, scarred mucosa in pylorus due to prior PUD. reactive gastropathy on path  . FRACTURE SURGERY    . LEFT ATRIAL APPENDAGE OCCLUSION N/A 03/18/2016   Procedure: LEFT ATRIAL APPENDAGE OCCLUSION;  Surgeon: [name], MD;  Location: [place] [facility];  Service: Cardiovascular;  Laterality: N/A;  . ORIF FEMUR FRACTURE Right 1960   "2 steel rods in leg"  . PATCH ANGIOPLASTY Left 05/08/2020   Procedure: PATCH ANGIOPLASTY LEFT FEMORAL ARTERY;  Surgeon: [name], MD;  Location: [place];  Service: Vascular;  Laterality: Left;  . PILONIDAL CYST EXCISION    . POLYPECTOMY  02/21/2017   Procedure: POLYPECTOMY;  Surgeon: Danie Binder, MD;  [facility];  Service: Endoscopy;;  colon  . REPAIR ILIAC ARTERY Left 05/08/2020   Procedure: EXPLORATION AND REPAIR LEFT FEMORAL ARTERY;  Surgeon: [name], MD;  Location: [place];  Service: Vascular;  Laterality: Left;  . SAVORY DILATION N/A 02/21/2017    Procedure: SAVORY DILATION;  Surgeon: Danie Binder, MD;  [facility];  Service: Endoscopy;  Laterality: N/A;  . TEE WITHOUT CARDIOVERSION N/A 03/10/2016   Procedure: TRANSESOPHAGEAL ECHOCARDIOGRAM (TEE);  Surgeon: Josue Hector, MD;  Location: [place];  Service: Cardiovascular;  Laterality: N/A;  . TEE WITHOUT CARDIOVERSION N/A 04/30/2016   Procedure: TRANSESOPHAGEAL ECHOCARDIOGRAM (TEE);  Surgeon: Josue Hector, MD;  Location: [facility];  Service: Cardiovascular;  Laterality: N/A;  . ULTRASOUND GUIDANCE FOR VASCULAR ACCESS Bilateral 05/08/2020   Procedure: ULTRASOUND GUIDANCE FOR VASCULAR ACCESS, bilateral femoral arteries;  Surgeon: [name], MD;  Location: [place];  Service: Vascular;  Laterality: Bilateral;  . Watchman procedure  02/2016   Allergies  Allergen Reactions  . Other Swelling  . Statins Other (See Comments)    Muscle aches Muscle aches  . Rivaroxaban Other (See Comments)    Developed redness in both legs Developed redness in both legs   No current facility-administered medications on file prior to encounter.   Current Outpatient Medications on File Prior to Encounter  Medication Sig Dispense Refill  . acetaminophen (TYLENOL) 500 MG tablet Take 1,000 mg by mouth every 8 (eight) hours as needed (pain).    . benzonatate (TESSALON) 100 MG capsule Take 1 capsule (100 mg total) by mouth 2 (two) times daily as needed for cough. 20 capsule 0  . cetirizine (ZYRTEC) 10 MG tablet Take 10 mg by mouth at bedtime.     . Chlorphen-PE-Acetaminophen (NOREL AD) 4-10-325 MG TABS Take 1 tablet by mouth every 4 (four) hours as needed (nasal congestion, cold symptoms). 20 tablet 1  . cholecalciferol (VITAMIN D3) 25 MCG (1000 UNIT) tablet Take 1,000 Units by mouth daily.    . clopidogrel (PLAVIX) 75 MG tablet Take 1 tablet (75 mg total) by mouth daily at 6 (six) AM. 30 tablet 2  . diltiazem (CARDIZEM CD) 240 MG 24 hr capsule Take 1 capsule (240 mg total) by mouth  at bedtime. 30 capsule 3  . fluticasone (FLONASE) 50 MCG/ACT nasal spray Place 1 spray into both nostrils every morning.     Marland Kitchen LORazepam (ATIVAN) 0.5 MG tablet Take 0.5 mg by mouth at bedtime.     . meclizine (ANTIVERT) 25 MG tablet Take 25 mg by mouth 2 (two) times daily. For dizziness May take an additional dose at [facility] if needed    . metoprolol succinate (TOPROL-XL) 25 MG 24 hr tablet Take 0.5 tablets (12.5 mg total) by mouth daily.    . Multiple Vitamin (MULTIVITAMIN) tablet Take 1 tablet by mouth daily.    . pantoprazole (PROTONIX) 40 MG tablet TAKE 1 TABLET BY MOUTH DAILY 30 MINUTES PRIOR TO BREAKFAST. 30 tablet 3  . rosuvastatin (CRESTOR) 5 MG tablet Take 5 mg by mouth every evening.     . tamsulosin (FLOMAX) 0.4 MG CAPS capsule Take 0.4 mg by mouth in the morning and at bedtime.     . triamterene-hydrochlorothiazide (MAXZIDE-25) 37.5-25 MG per tablet Take 1 tablet by mouth daily.     Social History  Socioeconomic History  . Marital status: Married    Spouse name: Not on file  . Number of children: 2  . Years of education: Not on file  . Highest education level: Not on file  Occupational History  . Occupation: Retired    Comment: Chief Technology Officer job  Tobacco Use  . Smoking status: Former Smoker    Packs/day: 1.00    Years: 40.00    Pack years: 40.00    Quit date: 01/20/2008    Years since quitting: 12.5  . Smokeless tobacco: Never Used  Vaping Use  . Vaping Use: Never used  Substance and Sexual Activity  . Alcohol use: Not Currently    Alcohol/week: 1.0 standard drink    Types: 1 Standard drinks or equivalent per week    Comment: rare wine  . Drug use: No  . Sexual activity: Yes  Other Topics Concern  . Not on file  Social History Narrative   Retired from Architect   Lives in [place] Strain: Not on file  Food Insecurity: Not on file  Transportation Needs: Not on file  Physical Activity:  Not on file  Stress: Not on file  Social Connections: Not on file  Intimate Partner Violence: Not on file   Family History  Problem Relation Age of Onset  . Hypertension Mother   . Hyperlipidemia Mother   . Renal cancer Mother   . Atrial fibrillation Sister   . Stroke Father   . Colon cancer Cousin   . Colon polyps Neg Hx     OBJECTIVE:  Vitals:   07/26/20 0919  BP: (!) 159/90  Pulse: 83  Resp: 18  Temp: (!) 97.5 F (36.4 C)  TempSrc: Oral  SpO2: 98%     General appearance: alert; appears fatigued, but nontoxic; speaking in full sentences and tolerating own secretions HEENT: NCAT; Ears: EACs clear, TMs pearly gray; Eyes: PERRL.  EOM grossly intact. Sinuses: nontender; Nose: nares patent without rhinorrhea, Throat: oropharynx clear, tonsils non erythematous or enlarged, uvula midline  Neck: supple without LAD Lungs: unlabored respirations, symmetrical air entry; cough: absent; no respiratory distress; CTAB Heart: regular rate and rhythm.  Radial pulses 2+ symmetrical bilaterally Skin: warm and dry Psychological: alert and cooperative; normal mood and affect  LABS:  No results found for this or any previous visit (from the past 24 hour(s)).   ASSESSMENT & PLAN:  1. COVID-19 virus infection   2. Persistent shortness of breath after COVID-19     Meds ordered this encounter  Medications  . dexamethasone (DECADRON) 4 MG tablet    Sig: Take 1 tablet (4 mg total) by mouth daily for 7 days.    Dispense:  7 tablet    Refill:  0  . albuterol (VENTOLIN HFA) 108 (90 Base) MCG/ACT inhaler    Sig: Inhale 1-2 puffs into the lungs every 6 (six) hours as needed for wheezing or shortness of breath.    Dispense:  18 g    Refill:  1   Discharge instructions  Get plenty of rest and push fluids Continue zyrtec for nasal congestion Continue flonase for nasal congestion and runny nose Decadron was prescribed ProAir was prescribed Use OTC medications like ibuprofen or tylenol  as needed fever or pain Call or go to the ED if you have any new or worsening symptoms such as fever, worsening cough, shortness of breath, chest tightness, chest pain, turning blue, changes in mental status, etc..Marland Kitchen  Reviewed expectations re: course of current medical issues. Questions answered. Outlined signs and symptoms indicating need for more acute intervention. Patient verbalized understanding. After Visit Summary given.         Emerson Monte, [place] 07/26/20 [phone]

## 2020-07-28 DIAGNOSIS — N401 Enlarged prostate with lower urinary tract symptoms: Secondary | ICD-10-CM | POA: Diagnosis not present

## 2020-07-28 DIAGNOSIS — I4821 Permanent atrial fibrillation: Secondary | ICD-10-CM | POA: Diagnosis not present

## 2020-07-28 DIAGNOSIS — D4111 Neoplasm of uncertain behavior of right renal pelvis: Secondary | ICD-10-CM | POA: Diagnosis not present

## 2020-07-28 DIAGNOSIS — E782 Mixed hyperlipidemia: Secondary | ICD-10-CM | POA: Diagnosis not present

## 2020-07-28 DIAGNOSIS — R7301 Impaired fasting glucose: Secondary | ICD-10-CM | POA: Diagnosis not present

## 2020-07-28 DIAGNOSIS — R519 Headache, unspecified: Secondary | ICD-10-CM | POA: Diagnosis not present

## 2020-07-28 DIAGNOSIS — R944 Abnormal results of kidney function studies: Secondary | ICD-10-CM | POA: Diagnosis not present

## 2020-07-28 DIAGNOSIS — I10 Essential (primary) hypertension: Secondary | ICD-10-CM | POA: Diagnosis not present

## 2020-07-28 DIAGNOSIS — I714 Abdominal aortic aneurysm, without rupture: Secondary | ICD-10-CM | POA: Diagnosis not present

## 2020-08-01 ENCOUNTER — Ambulatory Visit: Payer: PPO | Admitting: Gastroenterology

## 2020-08-01 ENCOUNTER — Encounter: Payer: Self-pay | Admitting: Gastroenterology

## 2020-08-01 VITALS — BP 122/90 | HR 60 | Ht 67.0 in | Wt 214.4 lb

## 2020-08-01 DIAGNOSIS — R1319 Other dysphagia: Secondary | ICD-10-CM | POA: Diagnosis not present

## 2020-08-01 DIAGNOSIS — R933 Abnormal findings on diagnostic imaging of other parts of digestive tract: Secondary | ICD-10-CM

## 2020-08-01 DIAGNOSIS — K2970 Gastritis, unspecified, without bleeding: Secondary | ICD-10-CM | POA: Diagnosis not present

## 2020-08-01 DIAGNOSIS — Z8719 Personal history of other diseases of the digestive system: Secondary | ICD-10-CM

## 2020-08-01 MED ORDER — PANTOPRAZOLE SODIUM 40 MG PO TBEC: 40.0000 mg | DELAYED_RELEASE_TABLET | Freq: Two times a day (BID) | ORAL | 3 refills | Status: DC

## 2020-08-01 NOTE — Patient Instructions (Addendum)
It was a pleasure to meet you today. Based on our discussion, I am providing you with my recommendations below:  RECOMMENDATION(S):   I am recommending that we schedule you for an Esophagram to better evaluate your concerns. In addition, I would like for you to increase your Pantoprazole as detailed below.  PRESCRIPTION MEDICATION(S):   We have sent the following medication(s) to your pharmacy:  . Pantoprazole - please take 40mg  by mouth twice daily  NOTE: If your medication(s) requires a PRIOR AUTHORIZATION, we will receive notification from your pharmacy. Once received, the process to submit for approval may take up to 7-10 business days. You will be contacted about any denials we have received from your insurance company as well as alternatives recommended by your provider.  IMAGING:  . You will be contacted by [facility] (Your caller ID will indicate phone # [phone]) within the next business 2 days to schedule your ESOPHAGRAM. If you have not heard from them within 2 business days, please call [facility] at [phone] to follow up on the status of your appointment.    FOLLOW UP:  . I will call you to discuss the results of your Esophagram and what our treatment plan will be to best address your concerns.  BMI:  . If you are age 78 or older, your body mass index should be between 23-30. Your There is no height or weight on file to calculate BMI. If this is out of the aforementioned range listed, please consider follow up with your Primary Care Provider.  Thank you for trusting me with your gastrointestinal care!    Th[place]ton [place], MD, MPH

## 2020-08-01 NOTE — Progress Notes (Signed)
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 Stable compared to 2017. EGD with Dr. Oneida Alar in 2018 showed antral scarring and gastropathy but no lesion noted. Ideally, would proceed with EGD with EUS for further evaluation. However, his history of esophageal stricture may limit his candidacy for EUS with dilatation first.   History of dysphagia with esophageal stricture: Some intermittent dysphagia noted today. Esophagram recommended to evaluate and make decision re: EGD with dilation following by EUS versus proceeding directly with EGD/EUS   History of high risk colon polyps: Surveillance colonoscopy recommended.   Chronic antiplatelet therapy with Plavix: Hold Plavix for 5 days before endoscopy.  I discussed with the patient that there is a low, but real, risk of a cardiovascular event such as heart attack, stroke, or embolism/thrombosis while off Plavix. Will communicate by phone or EMR with patient's prescribing provider to confirm that holding the Plavix is appropriate at this time.   Normocytic anemia: No overt bleeding. High risk for recurrent polyps and colon cancer given his history.   PLAN: Increase pantoprazole to 40 mg BID Esophagram EGD +/- dilation +/- EUS after reviewing esophagram, will need Plavix washout Surveillance colonoscopy given the history of colon polyps  I spent over 60 minutes, including in depth chart review, independent review of results, communicating results with the patient directly, face-to-face time with the patient, coordinating care, ordering studies and medications as appropriate, and documentation.  HPI: [name] is a 80 y.o. male referred by Dr. Barry Dienes. The history is obtained through the patient, review of his electronic health record,  and his wife accompanies him to this appointment.   Previously followed by Dr. Oneida Alar.  EGD for dysphagia revealed a benign stricture dilated from 15 mm to 17 mm with a savory, reactive gastropathy, mucosal scarring at the pylorus due to prior peptic ulcer disease.  Has been on pantoprazole since that time. 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 Surveillance colonoscopy recommended in 1-3 years.   Recently had a CTA abd/pelvis to evaluate a thoracic aortic aneurysm that showed a 1.5 cm exophytic lesion involving the anterior aspect of the stomach, thought to represent a GIST. Unchanged compared to CT scan from 2017.  I personally reviewed his CT scan imaging.   Intermittently feels gas with spicy foods and garlic. Treats with GasEx PRN with adequate smptom control. Otherwise, denies abdominal pain, nausea, blood in the stool, change in bowel habits.   Labs 05/09/20: WBC 10.2, hgb 10.1, MCV 86.5, RDW 14.6, platelets 133   Past Medical History:  Diagnosis Date  . AAA (abdominal aortic aneurysm) ([place])   . AKI (acute kidney injury) ([place]) 04/04/2016  . Aneurysm ([place])   . 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 Chest pain 07/17/2011   Not clearly of cardiac origin.  Will rx empirically with a PPI and assess response.  Admitted to [facility] in 06/2011; echo-moderate LVH, normal EF, mild aortic root dilatation; stress nuclear-normal perfusion, normal EF.   . Colon polyps   . COPD (chronic obstructive pulmonary disease) ([place])   . COVID-19   . Degenerative joint disease    had disc fusion in the past  . Hearing deficit, bilateral  left worse than right  . History of blood transfusion 12/2007   "bleeding for Warfarin"  . Hyperlipidemia   . Hypertension    Admitted for chest pain and 06/2011-neg stress Myoview,mod. LVH with nl EF on echo; TSH of 3.75  . Hyponatremia 04/04/2016  . Meniere's disease    . Nephrolithiasis   . Peripheral vascular disease ([place])    Small AAA (3.7-4cm 09/2010), small SMA aneurysm (18mm)  . Permanent atrial fibrillation ([place])    a. s/p Watchman  . Pneumonia   . Renal hemorrhage, right 2009   2009 while anticoagulated with Coumadin; treated with the renal artery embolization  . Renal hemorrhage, right    2009 while anticoagulated with Coumadin; treated with the renal artery embolization   . Sepsis ([place]) 04/04/2016    Past Surgical History:  Procedure Laterality Date  . ABDOMINAL AORTIC ENDOVASCULAR FENESTRATED STENT GRAFT N/A 05/08/2020   Procedure: ABDOMINAL AORTIC ENDOVASCULAR FENESTRATED STENT GRAFT;  Surgeon: [name], MD;  Location: [place];  Service: Vascular;  Laterality: N/A;  . BIOPSY  02/21/2017   Procedure: BIOPSY;  Surgeon: Danie Binder, MD;  [facility];  Service: Endoscopy;;  gastric  . COLONOSCOPY N/A 02/21/2017   multiple polyps, one requiring piecemeal removal with epi and clip placement, multiple small and large mouthed diverticula, recto-sigmoid and sigmoid with significant excess looping, internal hemorrhoids. Path with one advanced adenoma, 3 simple adenomas, 2 hyperplastic polyps. Survelilance 3 years   . EMBOLIZATION  12/2007   Left renal hematoma, status post angiogram and embolization/notes 09/29/2010  . ESOPHAGOGASTRODUODENOSCOPY N/A 02/21/2017   benign-appearing esophageal stenosis s/p dilation, gastritis, scarred mucosa in pylorus due to prior PUD. reactive gastropathy on path  . FRACTURE SURGERY    . LEFT ATRIAL APPENDAGE OCCLUSION N/A 03/18/2016   Procedure: LEFT ATRIAL APPENDAGE OCCLUSION;  Surgeon: [name], MD;  Location: [place] [facility];  Service: Cardiovascular;  Laterality: N/A;  . ORIF FEMUR FRACTURE Right 1960   "2 steel rods in leg"  . PATCH ANGIOPLASTY Left 05/08/2020   Procedure: PATCH ANGIOPLASTY LEFT FEMORAL ARTERY;  Surgeon: [name], MD;  Location: [place];  Service: Vascular;   Laterality: Left;  . PILONIDAL CYST EXCISION    . POLYPECTOMY  02/21/2017   Procedure: POLYPECTOMY;  Surgeon: Danie Binder, MD;  [facility];  Service: Endoscopy;;  colon  . REPAIR ILIAC ARTERY Left 05/08/2020   Procedure: EXPLORATION AND REPAIR LEFT FEMORAL ARTERY;  Surgeon: [name], MD;  Location: [place];  Service: Vascular;  Laterality: Left;  . SAVORY DILATION N/A 02/21/2017   Procedure: SAVORY DILATION;  Surgeon: Danie Binder, MD;  [facility];  Service: Endoscopy;  Laterality: N/A;  . TEE WITHOUT CARDIOVERSION N/A 03/10/2016   Procedure: TRANSESOPHAGEAL ECHOCARDIOGRAM (TEE);  Surgeon: Josue Hector, MD;  Location: [place];  Service: Cardiovascular;  Laterality: N/A;  . TEE WITHOUT CARDIOVERSION N/A 04/30/2016   Procedure: TRANSESOPHAGEAL ECHOCARDIOGRAM (TEE);  Surgeon: Josue Hector, MD;  Location: [facility];  Service: Cardiovascular;  Laterality: N/A;  . ULTRASOUND GUIDANCE FOR VASCULAR ACCESS Bilateral 05/08/2020   Procedure: ULTRASOUND GUIDANCE FOR VASCULAR ACCESS, bilateral femoral arteries;  Surgeon: [name], MD;  Location: [place];  Service: Vascular;  Laterality: Bilateral;  . Watchman procedure  02/2016    Current Outpatient Medications  Medication Sig Dispense Refill  . acetaminophen (TYLENOL) 500 MG tablet Take 1,000 mg by mouth every 8 (eight) hours as needed (pain).    Marland Kitchen  albuterol (VENTOLIN HFA) 108 (90 Base) MCG/ACT inhaler Inhale 1-2 puffs into the lungs every 6 (six) hours as needed for wheezing or shortness of breath. 18 g 1  . ascorbic acid (VITAMIN C) 500 MG tablet Take 2 tablets by mouth as needed.    Marland Kitchen aspirin EC 81 MG tablet Take 81 mg by mouth daily. Swallow whole.    . benzonatate (TESSALON) 100 MG capsule Take 1 capsule (100 mg total) by mouth 2 (two) times daily as needed for cough. 20 capsule 0  . cetirizine (ZYRTEC) 10 MG tablet Take 10 mg by mouth at bedtime.     . cholecalciferol (VITAMIN D3) 25 MCG  (1000 UNIT) tablet Take 1,000 Units by mouth daily.    . clopidogrel (PLAVIX) 75 MG tablet Take 1 tablet (75 mg total) by mouth daily at 6 (six) AM. 30 tablet 2  . diltiazem (CARDIZEM CD) 240 MG 24 hr capsule Take 1 capsule (240 mg total) by mouth at bedtime. 30 capsule 3  . fluticasone (FLONASE) 50 MCG/ACT nasal spray Place 1 spray into both nostrils every morning.     Marland Kitchen LORazepam (ATIVAN) 0.5 MG tablet Take 0.5 mg by mouth at bedtime.     . meclizine (ANTIVERT) 25 MG tablet Take 25 mg by mouth 2 (two) times daily. For dizziness May take an additional dose at [facility] if needed    . metoprolol succinate (TOPROL-XL) 25 MG 24 hr tablet Take 0.5 tablets (12.5 mg total) by mouth daily.    . mirabegron ER (MYRBETRIQ) 50 MG TB24 tablet Take 1 tablet by mouth at bedtime.    . Multiple Vitamin (MULTIVITAMIN) tablet Take 1 tablet by mouth daily.    . rosuvastatin (CRESTOR) 5 MG tablet Take 5 mg by mouth every evening.     . tamsulosin (FLOMAX) 0.4 MG CAPS capsule Take 0.4 mg by mouth in the morning and at bedtime.     . triamterene-hydrochlorothiazide (MAXZIDE-25) 37.5-25 MG per tablet Take 1 tablet by mouth daily.    . pantoprazole (PROTONIX) 40 MG tablet Take 1 tablet (40 mg total) by mouth 2 (two) times daily. TAKE 1 TABLET BY MOUTH DAILY 30 MINUTES PRIOR TO BREAKFAST. 180 tablet 3   No current facility-administered medications for this visit.    Allergies as of 08/01/2020 - Review Complete 08/01/2020  Allergen Reaction Noted  . Other Swelling 06/22/2020  . Statins Other (See Comments) 05/16/2012  . Rivaroxaban Other (See Comments) 03/18/2016    Family History  Problem Relation Age of Onset  . Hypertension Mother   . Hyperlipidemia Mother   . Renal cancer Mother   . Atrial fibrillation Sister   . Stroke Father   . Dementia Father   . Colon cancer Cousin   . Heart disease Brother   . Colon polyps Neg Hx     Social History   Socioeconomic History  . Marital status: Married    Spouse  name: Not on file  . Number of children: 2  . Years of education: Not on file  . Highest education level: Not on file  Occupational History  . Occupation: Retired    Comment: Chief Technology Officer job  Tobacco Use  . Smoking status: Former Smoker    Packs/day: 1.00    Years: 40.00    Pack years: 40.00    Quit date: 01/20/2008    Years since quitting: 12.5  . Smokeless tobacco: Never Used  Vaping Use  . Vaping Use: Never used  Substance and Sexual Activity  .  Alcohol use: Not Currently    Alcohol/week: 1.0 standard drink    Types: 1 Standard drinks or equivalent per week    Comment: rare wine  . Drug use: No  . Sexual activity: Yes  Other Topics Concern  . Not on file  Social History Narrative   Retired from [employer]   Lives in [place] Strain: Not on file  Food Insecurity: Not on file  Transportation Needs: Not on file  Physical Activity: Not on file  Stress: Not on file  Social Connections: Not on file  Intimate Partner Violence: Not on file    Review of Systems: 12 system ROS is negative except as noted above.   Physical Exam: General:   Alert,  well-nourished, pleasant and cooperative in NAD Head:  Normocephalic and atraumatic. Eyes:  Sclera clear, no icterus.   Conjunctiva pink. Ears:  Normal auditory acuity. Nose:  No deformity, discharge,  or lesions. Mouth:  No deformity or lesions.   Neck:  Supple; no masses or thyromegaly. Lungs:  Clear throughout to auscultation.   No wheezes. Heart:  Regular rate and rhythm; no murmurs. Abdomen:  Soft,nontender, nondistended, normal bowel sounds, no rebound or guarding. No hepatosplenomegaly.   Rectal:  Deferred  Msk:  Symmetrical. No boney deformities LAD: No inguinal or umbilical LAD Extremities:  No clubbing or edema. Neurologic:  Alert and  oriented x4;  grossly nonfocal Skin:  Intact without significant lesions or rashes. Psych:  Alert and  cooperative. Normal mood and affect.     Laurann Mcmorris L. Tarri Glenn, MD, MPH 08/01/2020, 1:21 PM

## 2020-08-01 NOTE — Progress Notes (Signed)
Following sent to Rhys Martini and April Pait:  [facility]  Phone: [phone]  Fax: [phone]   Patient Name: [name]  DOB: [birth year]  MRN #: [id number]   Imaging Ordered: Esophagram   Diagnosis: Esophageal dysphagia with stricture   Ordering Provider: Dr. Tarri Glenn   Is a Prior Authorization needed? We are in the process of obtaining it now   Is the patient Diabetic? No   Does the patient have Hypertension? No   Does the patient have any implanted devices or hardware? No   Date of last BUN/Creat, if needed? N/A   Patient Weight? 214#   Is the patient able to get on the table? Yes   Has the patient been diagnosed with COVID? No   Is the patient waiting on COVID testing results? No   Thank you for your assistance!  [facility] Team

## 2020-08-06 NOTE — Progress Notes (Signed)
Appointment Information  Name: Hobson, Lax" MRN: [id number]  Date: 09/11/2020 Status: Sch  Time: 9:30 AM Length: 30  Visit Type: DG ESOPHAGUS [680881] Copay: $0.00  Provider: [facility]DG 3 Cyndy Freeze) Department: [facility]-DIAGNOSTIC RAD  Referring Provider: [place] CSN: [id number]  Notes: arrive 9:15am/npo 3hrs/s/w pt  Made On: 08/01/2020 1:25 PM By: Roosvelt Maser

## 2020-08-07 ENCOUNTER — Other Ambulatory Visit: Payer: Self-pay

## 2020-08-07 MED ORDER — CLOPIDOGREL BISULFATE 75 MG PO TABS: 75.0000 mg | ORAL_TABLET | Freq: Every day | ORAL | 12 refills | Status: DC

## 2020-08-11 ENCOUNTER — Other Ambulatory Visit: Payer: Self-pay

## 2020-08-11 DIAGNOSIS — I10 Essential (primary) hypertension: Secondary | ICD-10-CM

## 2020-08-11 DIAGNOSIS — E785 Hyperlipidemia, unspecified: Secondary | ICD-10-CM

## 2020-08-11 DIAGNOSIS — I482 Chronic atrial fibrillation, unspecified: Secondary | ICD-10-CM

## 2020-08-11 DIAGNOSIS — F419 Anxiety disorder, unspecified: Secondary | ICD-10-CM

## 2020-08-11 MED ORDER — LORAZEPAM 0.5 MG PO TABS: 0.5000 mg | ORAL_TABLET | Freq: Every day | ORAL | 1 refills | Status: DC

## 2020-08-11 MED ORDER — TRIAMTERENE-HCTZ 37.5-25 MG PO TABS: 1.0000 | ORAL_TABLET | Freq: Every day | ORAL | 1 refills | Status: DC

## 2020-08-11 MED ORDER — ROSUVASTATIN CALCIUM 5 MG PO TABS: 5.0000 mg | ORAL_TABLET | Freq: Every evening | ORAL | 1 refills | Status: DC

## 2020-08-11 MED ORDER — DILTIAZEM HCL ER COATED BEADS 240 MG PO CP24: 240.0000 mg | ORAL_CAPSULE | Freq: Every day | ORAL | 1 refills | Status: DC

## 2020-08-11 MED ORDER — METOPROLOL SUCCINATE ER 25 MG PO TB24: 12.5000 mg | ORAL_TABLET | Freq: Every day | ORAL | 1 refills | Status: DC

## 2020-08-11 MED ORDER — CLOPIDOGREL BISULFATE 75 MG PO TABS: 75.0000 mg | ORAL_TABLET | Freq: Every day | ORAL | 3 refills | Status: AC

## 2020-08-12 ENCOUNTER — Other Ambulatory Visit: Payer: Self-pay

## 2020-08-12 DIAGNOSIS — K2970 Gastritis, unspecified, without bleeding: Secondary | ICD-10-CM

## 2020-08-12 MED ORDER — PANTOPRAZOLE SODIUM 40 MG PO TBEC: 40.0000 mg | DELAYED_RELEASE_TABLET | Freq: Two times a day (BID) | ORAL | 3 refills | Status: DC

## 2020-08-15 ENCOUNTER — Ambulatory Visit: Payer: PPO | Admitting: Cardiology

## 2020-08-15 ENCOUNTER — Other Ambulatory Visit: Payer: Self-pay

## 2020-08-15 ENCOUNTER — Encounter: Payer: Self-pay | Admitting: Cardiology

## 2020-08-15 VITALS — BP 110/66 | HR 69 | Ht 67.0 in | Wt 213.0 lb

## 2020-08-15 DIAGNOSIS — I4821 Permanent atrial fibrillation: Secondary | ICD-10-CM

## 2020-08-15 DIAGNOSIS — I714 Abdominal aortic aneurysm, without rupture, unspecified: Secondary | ICD-10-CM

## 2020-08-15 DIAGNOSIS — I10 Essential (primary) hypertension: Secondary | ICD-10-CM

## 2020-08-15 NOTE — Progress Notes (Signed)
Cardiology Office Note:    Date:  08/15/2020   ID:  [name], DOB 03-23-1941, MRN [id number]  PCP:  Noreene Larsson, NP   [place]  Cardiologist:  Candee Furbish, MD  Advanced Practice Provider:  No care team member to display Electrophysiologist:  None       Referring MD: Celene Squibb, MD     History of Present Illness:    [name] is a 80 y.o. 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 EVAR Stress test 2021-low risk no ischemia.  Doing well.  wishes he had more get up and go.  Denies any fevers chills nausea vomiting syncope bleeding  Past Medical History:  Diagnosis Date  . AAA (abdominal aortic aneurysm) ([place])   . AKI (acute kidney injury) ([place]) 04/04/2016  . Aneurysm ([place])   . 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 Chest pain 07/17/2011   Not clearly of cardiac origin.  Will rx empirically with a PPI and assess response.  Admitted to [facility] in 06/2011; echo-moderate LVH, normal EF, mild aortic root dilatation; stress nuclear-normal perfusion, normal EF.   . Colon polyps   . COPD (chronic obstructive pulmonary disease) ([place])   . COVID-19   . Degenerative joint disease    had disc fusion in the past  . Hearing deficit, bilateral    left worse than right  . History of blood transfusion 12/2007   "bleeding for Warfarin"  . Hyperlipidemia   . Hypertension    Admitted for chest pain and 06/2011-neg stress Myoview,mod. LVH with nl EF on echo; TSH of 3.75  . Hyponatremia 04/04/2016  . Meniere's disease   . Nephrolithiasis   . Peripheral vascular disease ([place])    Small AAA (3.7-4cm 09/2010), small SMA aneurysm (76mm)  . Permanent atrial fibrillation ([place])    a. s/p Watchman  . Pneumonia   . Renal hemorrhage, right 2009   2009 while anticoagulated with Coumadin; treated with the renal artery embolization  . Renal hemorrhage, right    2009 while anticoagulated with  Coumadin; treated with the renal artery embolization   . Sepsis ([place]) 04/04/2016    Past Surgical History:  Procedure Laterality Date  . ABDOMINAL AORTIC ENDOVASCULAR FENESTRATED STENT GRAFT N/A 05/08/2020   Procedure: ABDOMINAL AORTIC ENDOVASCULAR FENESTRATED STENT GRAFT;  Surgeon: [name], MD;  Location: [place];  Service: Vascular;  Laterality: N/A;  . BIOPSY  02/21/2017   Procedure: BIOPSY;  Surgeon: Danie Binder, MD;  [facility];  Service: Endoscopy;;  gastric  . COLONOSCOPY N/A 02/21/2017   multiple polyps, one requiring piecemeal removal with epi and clip placement, multiple small and large mouthed diverticula, recto-sigmoid and sigmoid with significant excess looping, internal hemorrhoids. Path with one advanced adenoma, 3 simple adenomas, 2 hyperplastic polyps. Survelilance 3 years   . EMBOLIZATION  12/2007   Left renal hematoma, status post angiogram and embolization/notes 09/29/2010  . ESOPHAGOGASTRODUODENOSCOPY N/A 02/21/2017   benign-appearing esophageal stenosis s/p dilation, gastritis, scarred mucosa in pylorus due to prior PUD. reactive gastropathy on path  . FRACTURE SURGERY    . LEFT ATRIAL APPENDAGE OCCLUSION N/A 03/18/2016   Procedure: LEFT ATRIAL APPENDAGE OCCLUSION;  Surgeon: [name], MD;  Location: [place];  Service: Cardiovascular;  Laterality: N/A;  . ORIF FEMUR FRACTURE Right 1960   "2 steel rods in leg"  . PATCH ANGIOPLASTY Left 05/08/2020  Procedure: PATCH ANGIOPLASTY LEFT FEMORAL ARTERY;  Surgeon: [name], MD;  Location: [place];  Service: Vascular;  Laterality: Left;  . PILONIDAL CYST EXCISION    . POLYPECTOMY  02/21/2017   Procedure: POLYPECTOMY;  Surgeon: Danie Binder, MD;  [facility];  Service: Endoscopy;;  colon  . REPAIR ILIAC ARTERY Left 05/08/2020   Procedure: EXPLORATION AND REPAIR LEFT FEMORAL ARTERY;  Surgeon: [name], MD;  Location: [place];  Service: Vascular;  Laterality:  Left;  . SAVORY DILATION N/A 02/21/2017   Procedure: SAVORY DILATION;  Surgeon: Danie Binder, MD;  [facility];  Service: Endoscopy;  Laterality: N/A;  . TEE WITHOUT CARDIOVERSION N/A 03/10/2016   Procedure: TRANSESOPHAGEAL ECHOCARDIOGRAM (TEE);  Surgeon: Josue Hector, MD;  Location: [place];  Service: Cardiovascular;  Laterality: N/A;  . TEE WITHOUT CARDIOVERSION N/A 04/30/2016   Procedure: TRANSESOPHAGEAL ECHOCARDIOGRAM (TEE);  Surgeon: Josue Hector, MD;  Location[facility];  Service: Cardiovascular;  Laterality: N/A;  . ULTRASOUND GUIDANCE FOR VASCULAR ACCESS Bilateral 05/08/2020   Procedure: ULTRASOUND GUIDANCE FOR VASCULAR ACCESS, bilateral femoral arteries;  Surgeon: [name], MD;  Location: [place];  Service: Vascular;  Laterality: Bilateral;  . Watchman procedure  02/2016    Current Medications: Current Meds  Medication Sig  . acetaminophen (TYLENOL) 500 MG tablet Take 1,000 mg by mouth every 8 (eight) hours as needed (pain).  Marland Kitchen albuterol (VENTOLIN HFA) 108 (90 Base) MCG/ACT inhaler Inhale 1-2 puffs into the lungs every 6 (six) hours as needed for wheezing or shortness of breath.  Marland Kitchen ascorbic acid (VITAMIN C) 500 MG tablet Take 2 tablets by mouth as needed.  Marland Kitchen aspirin EC 81 MG tablet Take 81 mg by mouth daily. Swallow whole.  . benzonatate (TESSALON) 100 MG capsule Take 1 capsule (100 mg total) by mouth 2 (two) times daily as needed for cough.  . cetirizine (ZYRTEC) 10 MG tablet Take 10 mg by mouth at bedtime.   . cholecalciferol (VITAMIN D3) 25 MCG (1000 UNIT) tablet Take 1,000 Units by mouth daily.  . clopidogrel (PLAVIX) 75 MG tablet Take 1 tablet (75 mg total) by mouth daily at 6 (six) AM.  . diltiazem (CARDIZEM CD) 240 MG 24 hr capsule Take 1 capsule (240 mg total) by mouth at bedtime.  . fluticasone (FLONASE) 50 MCG/ACT nasal spray Place 1 spray into both nostrils every morning.   Marland Kitchen LORazepam (ATIVAN) 0.5 MG tablet Take 1 tablet (0.5 mg total)  by mouth at bedtime.  . meclizine (ANTIVERT) 25 MG tablet Take 25 mg by mouth 2 (two) times daily. For dizziness May take an additional dose at [facility] if needed  . metoprolol succinate (TOPROL-XL) 25 MG 24 hr tablet Take 0.5 tablets (12.5 mg total) by mouth daily.  . mirabegron ER (MYRBETRIQ) 50 MG TB24 tablet Take 1 tablet by mouth at bedtime.  . Multiple Vitamin (MULTIVITAMIN) tablet Take 1 tablet by mouth daily.  . pantoprazole (PROTONIX) 40 MG tablet Take 1 tablet (40 mg total) by mouth 2 (two) times daily.  . rosuvastatin (CRESTOR) 5 MG tablet Take 1 tablet (5 mg total) by mouth every evening.  . tamsulosin (FLOMAX) 0.4 MG CAPS capsule Take 0.4 mg by mouth in the morning and at bedtime.   . triamterene-hydrochlorothiazide (MAXZIDE-25) 37.5-25 MG tablet Take 1 tablet by mouth daily.     Allergies:   Other, Statins, and Rivaroxaban   Social History   Socioeconomic History  . Marital status: Married  Spouse name: Not on file  . Number of children: 2  . Years of education: Not on file  . Highest education level: Not on file  Occupational History  . Occupation: Retired    Comment: Chief Technology Officer job  Tobacco Use  . Smoking status: Former Smoker    Packs/day: 1.00    Years: 40.00    Pack years: 40.00    Quit date: 01/20/2008    Years since quitting: 12.5  . Smokeless tobacco: Never Used  Vaping Use  . Vaping Use: Never used  Substance and Sexual Activity  . Alcohol use: Not Currently    Alcohol/week: 1.0 standard drink    Types: 1 Standard drinks or equivalent per week    Comment: rare wine  . Drug use: No  . Sexual activity: Yes  Other Topics Concern  . Not on file  Social History Narrative   Retired from [employer]   Lives in [place] Strain: Not on file  Food Insecurity: Not on file  Transportation Needs: Not on file  Physical Activity: Not on file  Stress: Not on file  Social Connections:  Not on file     Family History: The patient's family history includes Atrial fibrillation in his sister; Colon cancer in his cousin; Dementia in his father; Heart disease in his brother; Hyperlipidemia in his mother; Hypertension in his mother; Renal cancer in his mother; Stroke in his father. There is no history of Colon polyps.  ROS:   Please see the history of present illness.     All other systems reviewed and are negative.  EKGs/Labs/Other Studies Reviewed:    Recent Labs: 05/05/2020: ALT 18 05/08/2020: Magnesium 1.6 05/09/2020: BUN 14; Creatinine, Ser 1.11; Hemoglobin 10.1; Platelets 133; Potassium 3.7; Sodium 137  Recent Lipid Panel    Component Value Date/Time   CHOL 161 12/08/2012 0705   TRIG 165 (H) 12/08/2012 0705   HDL 37 (L) 12/08/2012 0705   CHOLHDL 4.4 12/08/2012 0705   VLDL 33 12/08/2012 0705   LDLCALC 91 12/08/2012 0705     Risk Assessment/Calculations:      Physical Exam:    VS:  BP 110/66 (BP Location: Left Arm, Patient Position: Sitting, Cuff Size: Normal)   Pulse 69   Ht 5\' 7"  (1.702 m)   Wt 213 lb (96.6 kg)   SpO2 97%   BMI 33.36 kg/m     Wt Readings from Last 3 Encounters:  08/15/20 213 lb (96.6 kg)  08/01/20 214 lb 6 oz (97.2 kg)  07/15/20 215 lb (97.5 kg)     GEN:  Well nourished, well developed in no acute distress HEENT: Normal NECK: No JVD; No carotid bruits LYMPHATICS: No lymphadenopathy CARDIAC: irreg, no murmurs, rubs, gallops RESPIRATORY:  Clear to auscultation without rales, wheezing or rhonchi  ABDOMEN: Soft, non-tender, non-distended MUSCULOSKELETAL:  No edema; No deformity  SKIN: Warm and dry NEUROLOGIC:  Alert and oriented x 3 PSYCHIATRIC:  Normal affect   ASSESSMENT:    No diagnosis found. PLAN:    In order of problems listed above:  AAA repair -Dr. Carlis Abbott.  Excellent.  Doing well.  On Plavix and aspirin.  Permanent atrial fibrillation -Doing well rate controlled. -Has watchman  device.  Hyperlipidemia -Continue with Crestor.  Secondary risk factor prevention.  No myalgias.  Last LDL 67.     Medication Adjustments/Labs and Tests Ordered: Current medicines are reviewed at length with the patient today.  Concerns regarding medicines  are outlined above.  No orders of the defined types were placed in this encounter.  No orders of the defined types were placed in this encounter.   Patient Instructions  Medication Instructions:  The current medical regimen is effective;  continue present plan and medications.  *If you need a refill on your cardiac medications before your next appointment, please call your pharmacy*  Follow-Up: At [facility], you and your health needs are our priority.  As part of our continuing mission to provide you with exceptional heart care, we have created designated Provider Care Teams.  These Care Teams include your primary Cardiologist (physician) and Advanced Practice Providers (APPs -  Physician Assistants and Nurse Practitioners) who all work together to provide you with the care you need, when you need it.  We recommend signing up for the patient portal called "MyChart".  Sign up information is provided on this After Visit Summary.  MyChart is used to connect with patients for Virtual Visits (Telemedicine).  Patients are able to view lab/test results, encounter notes, upcoming appointments, etc.  Non-urgent messages can be sent to your provider as well.   To learn more about what you can do with MyChart, go to [email or web address].    Your next appointment:   6 month(s)  The format for your next appointment:   In Person  Provider:   Candee Furbish, MD   Thank you for choosing [facility]!!        Signed, Candee Furbish, MD  08/15/2020 2:19 PM    [place]

## 2020-08-15 NOTE — Patient Instructions (Signed)

## 2020-08-18 DIAGNOSIS — R7301 Impaired fasting glucose: Secondary | ICD-10-CM | POA: Diagnosis not present

## 2020-08-18 DIAGNOSIS — Z139 Encounter for screening, unspecified: Secondary | ICD-10-CM | POA: Diagnosis not present

## 2020-08-18 DIAGNOSIS — E785 Hyperlipidemia, unspecified: Secondary | ICD-10-CM | POA: Diagnosis not present

## 2020-08-18 DIAGNOSIS — Z7689 Persons encountering health services in other specified circumstances: Secondary | ICD-10-CM | POA: Diagnosis not present

## 2020-08-19 ENCOUNTER — Other Ambulatory Visit: Payer: Self-pay | Admitting: Nurse Practitioner

## 2020-08-19 ENCOUNTER — Other Ambulatory Visit: Payer: Self-pay

## 2020-08-19 DIAGNOSIS — R7301 Impaired fasting glucose: Secondary | ICD-10-CM

## 2020-08-19 LAB — LIPID PANEL WITH LDL/HDL RATIO
Cholesterol, Total: 141 mg/dL (ref 100–199)
HDL: 60 mg/dL (ref >39)
LDL Chol Calc (NIH): 65 mg/dL (ref 0–99)
LDL/HDL Ratio: 1.1 ratio (ref 0.0–3.6)
Triglycerides: 82 mg/dL (ref 0–149)
VLDL Cholesterol Cal: 16 mg/dL (ref 5–40)

## 2020-08-19 LAB — CMP14+EGFR
ALT: 13 IU/L (ref 0–44)
AST: 15 IU/L (ref 0–40)
Albumin/Globulin Ratio: 1.6 (ref 1.2–2.2)
Albumin: 3.9 g/dL (ref 3.7–4.7)
Alkaline Phosphatase: 41 IU/L — ABNORMAL LOW (ref 44–121)
BUN/Creatinine Ratio: 11 (ref 10–24)
BUN: 13 mg/dL (ref 8–27)
Bilirubin Total: 0.6 mg/dL (ref 0.0–1.2)
CO2: 23 mmol/L (ref 20–29)
Calcium: 9 mg/dL (ref 8.6–10.2)
Chloride: 97 mmol/L (ref 96–106)
Creatinine, Ser: 1.19 mg/dL (ref 0.76–1.27)
Globulin, Total: 2.5 g/dL (ref 1.5–4.5)
Glucose: 148 mg/dL — ABNORMAL HIGH (ref 65–99)
Potassium: 3.9 mmol/L (ref 3.5–5.2)
Sodium: 141 mmol/L (ref 134–144)
Total Protein: 6.4 g/dL (ref 6.0–8.5)
eGFR: 63 mL/min/{1.73_m2} (ref >59)

## 2020-08-19 LAB — CBC WITH DIFFERENTIAL/PLATELET
Basophils Absolute: 0 10*3/uL (ref 0.0–0.2)
Basos: 0 %
EOS (ABSOLUTE): 0.2 10*3/uL (ref 0.0–0.4)
Eos: 4 %
Hematocrit: 32.7 % — ABNORMAL LOW (ref 37.5–51.0)
Hemoglobin: 10.8 g/dL — ABNORMAL LOW (ref 13.0–17.7)
Immature Grans (Abs): 0 10*3/uL (ref 0.0–0.1)
Immature Granulocytes: 0 %
Lymphocytes Absolute: 0.3 10*3/uL — ABNORMAL LOW (ref 0.7–3.1)
Lymphs: 6 %
MCH: 26.8 pg (ref 26.6–33.0)
MCHC: 33 g/dL (ref 31.5–35.7)
MCV: 81 fL (ref 79–97)
Monocytes Absolute: 0.5 10*3/uL (ref 0.1–0.9)
Monocytes: 10 %
Neutrophils Absolute: 4 10*3/uL (ref 1.4–7.0)
Neutrophils: 80 %
Platelets: 199 10*3/uL (ref 150–450)
RBC: 4.03 x10E6/uL — ABNORMAL LOW (ref 4.14–5.80)
RDW: 16.9 % — ABNORMAL HIGH (ref 11.6–15.4)
WBC: 5 10*3/uL (ref 3.4–10.8)

## 2020-08-19 LAB — HCV INTERPRETATION

## 2020-08-19 LAB — HEMOGLOBIN A1C
Est. average glucose Bld gHb Est-mCnc: 160 mg/dL
Hgb A1c MFr Bld: 7.2 % — ABNORMAL HIGH (ref 4.8–5.6)

## 2020-08-19 LAB — HCV AB W/RFLX TO VERIFICATION: HCV Ab: 0.2 s/co ratio (ref 0.0–0.9)

## 2020-08-19 MED ORDER — METFORMIN HCL 500 MG PO TABS: 500.0000 mg | ORAL_TABLET | Freq: Every day | ORAL | 1 refills | Status: DC

## 2020-08-19 NOTE — Progress Notes (Signed)
A1c = 7.2, so I called in metformin. This medicine can cause diarrhea/GI upset, but this goes away within the first week. Your A1c is in the diabtetic range, and metformin will help you bring down your blood sugar.

## 2020-08-21 ENCOUNTER — Ambulatory Visit (INDEPENDENT_AMBULATORY_CARE_PROVIDER_SITE_OTHER): Payer: PPO | Admitting: Nurse Practitioner

## 2020-08-21 ENCOUNTER — Other Ambulatory Visit: Payer: Self-pay

## 2020-08-21 ENCOUNTER — Encounter: Payer: Self-pay | Admitting: Nurse Practitioner

## 2020-08-21 DIAGNOSIS — E785 Hyperlipidemia, unspecified: Secondary | ICD-10-CM

## 2020-08-21 DIAGNOSIS — E1165 Type 2 diabetes mellitus with hyperglycemia: Secondary | ICD-10-CM | POA: Insufficient documentation

## 2020-08-21 DIAGNOSIS — I10 Essential (primary) hypertension: Secondary | ICD-10-CM

## 2020-08-21 MED ORDER — BLOOD GLUCOSE METER KIT: PACK | 0 refills | Status: AC

## 2020-08-21 NOTE — Assessment & Plan Note (Signed)
Lab Results  Component Value Date   CHOL 141 08/18/2020   HDL 60 08/18/2020   LDLCALC 65 08/18/2020   TRIG 82 08/18/2020   CHOLHDL 4.4 12/08/2012   -Goal LDL, 70, and he is at goal

## 2020-08-21 NOTE — Assessment & Plan Note (Signed)
Lab Results  Component Value Date   HGBA1C 7.2 (H) 08/18/2020   -Rx. Metformin -Rx. Meter+strips

## 2020-08-21 NOTE — Progress Notes (Signed)
Acute Office Visit  Subjective:    Patient ID: [name], male    DOB: [birth year], 80 y.o.   MRN: [id number]  Chief Complaint  Patient presents with  . Hypertension    Follow up.    HPI Patient is in today for lab follow-up and BP check. His fasting blood sugar was elevated, so we checked his A1c which was 7.2. He was prescribed metformin, but he has not picked this up yet.  Past Medical History:  Diagnosis Date  . AAA (abdominal aortic aneurysm) ([place])   . AKI (acute kidney injury) ([place]) 04/04/2016  . Aneurysm ([place])   . 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 Chest pain 07/17/2011   Not clearly of cardiac origin.  Will rx empirically with a PPI and assess response.  Admitted to [facility] in 06/2011; echo-moderate LVH, normal EF, mild aortic root dilatation; stress nuclear-normal perfusion, normal EF.   . Colon polyps   . COPD (chronic obstructive pulmonary disease) ([place])   . COVID-19   . Degenerative joint disease    had disc fusion in the past  . Hearing deficit, bilateral    left worse than right  . History of blood transfusion 12/2007   "bleeding for Warfarin"  . Hyperlipidemia   . Hypertension    Admitted for chest pain and 06/2011-neg stress Myoview,mod. LVH with nl EF on echo; TSH of 3.75  . Hyponatremia 04/04/2016  . Meniere's disease   . Nephrolithiasis   . 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 Permanent atrial fibrillation ([place]    a. s/p Watchman  . Pneumonia   . Renal hemorrhage, right 2009   2009 while anticoagulated with Coumadin; treated with the renal artery embolization  . Renal hemorrhage, right    2009 while anticoagulated with Coumadin; treated with the renal artery embolization   . Sepsis ([place] 04/04/2016    Past Surgical History:  Procedure Laterality Date  . ABDOMINAL AORTIC ENDOVASCULAR FENESTRATED STENT GRAFT N/A 05/08/2020   Procedure: ABDOMINAL AORTIC ENDOVASCULAR FENESTRATED STENT GRAFT;   Surgeon: CMarty Heck MD;  [facility]  Service: Vascular;  Laterality: N/A;  . BIOPSY  02/21/2017   Procedure: BIOPSY;  Surgeon: FDanie Binder MD;  [facility];  Service: Endoscopy;;  gastric  . COLONOSCOPY N/A 02/21/2017   multiple polyps, one requiring piecemeal removal with epi and clip placement, multiple small and large mouthed diverticula, recto-sigmoid and sigmoid with significant excess looping, internal hemorrhoids. Path with one advanced adenoma, 3 simple adenomas, 2 hyperplastic polyps. Survelilance 3 years   . EMBOLIZATION  12/2007   Left renal hematoma, status post angiogram and embolization/notes 09/29/2010  . ESOPHAGOGASTRODUODENOSCOPY N/A 02/21/2017   benign-appearing esophageal stenosis s/p dilation, gastritis, scarred mucosa in pylorus due to prior PUD. reactive gastropathy on path  . FRACTURE SURGERY    . LEFT ATRIAL APPENDAGE OCCLUSION N/A 03/18/2016   Procedure: LEFT ATRIAL APPENDAGE OCCLUSION;  Surgeon: JThompson Grayer MD;  [facility];  Service: Cardiovascular;  Laterality: N/A;  . ORIF FEMUR FRACTURE Right 1960   "2 steel rods in leg"  . PATCH ANGIOPLASTY Left 05/08/2020   Procedure: PATCH ANGIOPLASTY LEFT FEMORAL ARTERY;  Surgeon: CMarty Heck MD;  [place] [facility]  Service: Vascular;  Laterality: Left;  . PILONIDAL CYST EXCISION    . POLYPECTOMY  02/21/2017   Procedure: POLYPECTOMY;  Surgeon: FDanie Binder MD;  [facility];  Service: Endoscopy;;  colon  . REPAIR ILIAC ARTERY Left 05/08/2020   Procedure: EXPLORATION AND REPAIR LEFT FEMORAL ARTERY;  Surgeon: [name], MD;  Location: [place];  Service: Vascular;  Laterality: Left;  . SAVORY DILATION N/A 02/21/2017   Procedure: SAVORY DILATION;  Surgeon: Danie Binder, MD;  [facility];  Service: Endoscopy;  Laterality: N/A;  . TEE WITHOUT CARDIOVERSION N/A 03/10/2016   Procedure: TRANSESOPHAGEAL ECHOCARDIOGRAM (TEE);  Surgeon: Josue Hector, MD;  Location: [place];  Service: Cardiovascular;  Laterality: N/A;  . TEE WITHOUT CARDIOVERSION N/A 04/30/2016   Procedure: TRANSESOPHAGEAL ECHOCARDIOGRAM (TEE);  Surgeon: Josue Hector, MD;  Location[facility];  Service: Cardiovascular;  Laterality: N/A;  . ULTRASOUND GUIDANCE FOR VASCULAR ACCESS Bilateral 05/08/2020   Procedure: ULTRASOUND GUIDANCE FOR VASCULAR ACCESS, bilateral femoral arteries;  Surgeon: [name], MD;  Location: [place];  Service: Vascular;  Laterality: Bilateral;  . Watchman procedure  02/2016    Family History  Problem Relation Age of Onset  . Hypertension Mother   . Hyperlipidemia Mother   . Renal cancer Mother   . Atrial fibrillation Sister   . Stroke Father   . Dementia Father   . Colon cancer Cousin   . Heart disease Brother   . Colon polyps Neg Hx     Social History   Socioeconomic History  . Marital status: Married    Spouse name: Not on file  . Number of children: 2  . Years of education: Not on file  . Highest education level: Not on file  Occupational History  . Occupation: Retired    Comment: Chief Technology Officer job  Tobacco Use  . Smoking status: Former Smoker    Packs/day: 1.00    Years: 40.00    Pack years: 40.00    Quit date: 01/20/2008    Years since quitting: 12.5  . Smokeless tobacco: Never Used  Vaping Use  . Vaping Use: Never used  Substance and Sexual Activity  . Alcohol use: Not Currently    Alcohol/week: 1.0 standard drink    Types: 1 Standard drinks or equivalent per week    Comment: rare wine  . Drug use: No  . Sexual activity: Yes  Other Topics Concern  . Not on file  Social History Narrative   Retired from [employer]   Lives in [place] Strain: Not on file  Food Insecurity: Not on file  Transportation Needs: Not on file  Physical Activity: Not on file  Stress: Not on file  Social Connections: Not on file  Intimate  Partner Violence: Not on file    Outpatient Medications Prior to Visit  Medication Sig Dispense Refill  . acetaminophen (TYLENOL) 500 MG tablet Take 1,000 mg by mouth every 8 (eight) hours as needed (pain).    Marland Kitchen albuterol (VENTOLIN HFA) 108 (90 Base) MCG/ACT inhaler Inhale 1-2 puffs into the lungs every 6 (six) hours as needed for wheezing or shortness of breath. 18 g 1  . ascorbic acid (VITAMIN C) 500 MG tablet Take 2 tablets by mouth as needed.    Marland Kitchen aspirin EC 81 MG tablet Take 81 mg by mouth daily. Swallow whole.    . benzonatate (TESSALON) 100 MG capsule Take 1 capsule (100 mg total) by mouth 2 (two) times daily as needed for cough. 20 capsule 0  . cetirizine (ZYRTEC) 10 MG tablet Take 10 mg by mouth at bedtime.     Marland Kitchen  cholecalciferol (VITAMIN D3) 25 MCG (1000 UNIT) tablet Take 1,000 Units by mouth daily.    . clopidogrel (PLAVIX) 75 MG tablet Take 1 tablet (75 mg total) by mouth daily at 6 (six) AM. 90 tablet 3  . diltiazem (CARDIZEM CD) 240 MG 24 hr capsule Take 1 capsule (240 mg total) by mouth at bedtime. 90 capsule 1  . fluticasone (FLONASE) 50 MCG/ACT nasal spray Place 1 spray into both nostrils every morning.     Marland Kitchen LORazepam (ATIVAN) 0.5 MG tablet Take 1 tablet (0.5 mg total) by mouth at bedtime. 90 tablet 1  . meclizine (ANTIVERT) 25 MG tablet Take 25 mg by mouth 2 (two) times daily. For dizziness May take an additional dose at [facility] if needed    . metFORMIN (GLUCOPHAGE) 500 MG tablet Take 1 tablet (500 mg total) by mouth daily with breakfast. 90 tablet 1  . metoprolol succinate (TOPROL-XL) 25 MG 24 hr tablet Take 0.5 tablets (12.5 mg total) by mouth daily. 90 tablet 1  . mirabegron ER (MYRBETRIQ) 50 MG TB24 tablet Take 1 tablet by mouth at bedtime.    . Multiple Vitamin (MULTIVITAMIN) tablet Take 1 tablet by mouth daily.    . pantoprazole (PROTONIX) 40 MG tablet Take 1 tablet (40 mg total) by mouth 2 (two) times daily. 180 tablet 3  . rosuvastatin (CRESTOR) 5 MG tablet Take 1 tablet  (5 mg total) by mouth every evening. 90 tablet 1  . tamsulosin (FLOMAX) 0.4 MG CAPS capsule Take 0.4 mg by mouth in the morning and at bedtime.     . triamterene-hydrochlorothiazide (MAXZIDE-25) 37.5-25 MG tablet Take 1 tablet by mouth daily. 90 tablet 1   No facility-administered medications prior to visit.    Allergies  Allergen Reactions  . Other Swelling  . Statins Other (See Comments)    Muscle aches Muscle aches  . Rivaroxaban Other (See Comments)    Developed redness in both legs Developed redness in both legs    Review of Systems  Constitutional: Negative.   Respiratory: Negative.   Cardiovascular: Negative.   Endocrine: Negative for polydipsia, polyphagia and polyuria.  Musculoskeletal: Negative.   Psychiatric/Behavioral: Negative.        Objective:    Physical Exam Constitutional:      Appearance: Normal appearance.  Cardiovascular:     Rate and Rhythm: Normal rate. Rhythm irregular.     Pulses: Normal pulses.     Heart sounds: Normal heart sounds.  Pulmonary:     Effort: Pulmonary effort is normal.     Breath sounds: Normal breath sounds.  Musculoskeletal:        General: Normal range of motion.  Neurological:     Mental Status: He is alert.  Psychiatric:        Mood and Affect: Mood normal.        Behavior: Behavior normal.        Thought Content: Thought content normal.        Judgment: Judgment normal.     BP (!) 152/82   Pulse 74   Temp (!) 97.4 F (36.3 C)   Resp 18   Ht 5' 7"  (1.702 m)   Wt 211 lb (95.7 kg)   SpO2 95%   BMI 33.05 kg/m  Wt Readings from Last 3 Encounters:  08/21/20 211 lb (95.7 kg)  08/15/20 213 lb (96.6 kg)  08/01/20 214 lb 6 oz (97.2 kg)    Health Maintenance Due  Topic Date Due  . FOOT EXAM  Never done  . OPHTHALMOLOGY EXAM  Never done  . COVID-19 Vaccine (3 - Booster for Moderna series) 03/26/2020    There are no preventive care reminders to display for this patient.   Lab Results  Component Value Date    TSH 3.758 06/19/2012   Lab Results  Component Value Date   WBC 5.0 08/18/2020   HGB 10.8 (L) 08/18/2020   HCT 32.7 (L) 08/18/2020   MCV 81 08/18/2020   PLT 199 08/18/2020   Lab Results  Component Value Date   NA 141 08/18/2020   K 3.9 08/18/2020   CO2 23 08/18/2020   GLUCOSE 148 (H) 08/18/2020   BUN 13 08/18/2020   CREATININE 1.19 08/18/2020   BILITOT 0.6 08/18/2020   ALKPHOS 41 (L) 08/18/2020   AST 15 08/18/2020   ALT 13 08/18/2020   PROT 6.4 08/18/2020   ALBUMIN 3.9 08/18/2020   CALCIUM 9.0 08/18/2020   ANIONGAP 10 05/09/2020   Lab Results  Component Value Date   CHOL 141 08/18/2020   Lab Results  Component Value Date   HDL 60 08/18/2020   Lab Results  Component Value Date   LDLCALC 65 08/18/2020   Lab Results  Component Value Date   TRIG 82 08/18/2020   Lab Results  Component Value Date   CHOLHDL 4.4 12/08/2012   Lab Results  Component Value Date   HGBA1C 7.2 (H) 08/18/2020       Assessment & Plan:   Problem List Items Addressed This Visit      Cardiovascular and Mediastinum   Hypertension    -BP elevated today -will changed meds if still elevated at next visit      Relevant Orders   CBC with Differential/Platelet   CMP14+EGFR     Endocrine   Type II diabetes mellitus, uncontrolled ([place])    Lab Results  Component Value Date   HGBA1C 7.2 (H) 08/18/2020   -Rx. Metformin -Rx. Meter+strips      Relevant Orders   CBC with Differential/Platelet   CMP14+EGFR   Hemoglobin A1c   Lipid Panel With LDL/HDL Ratio   Microalbumin / creatinine urine ratio     Other   Hyperlipidemia    Lab Results  Component Value Date   CHOL 141 08/18/2020   HDL 60 08/18/2020   LDLCALC 65 08/18/2020   TRIG 82 08/18/2020   CHOLHDL 4.4 12/08/2012   -Goal LDL, 70, and he is at goal      Relevant Orders   CBC with Differential/Platelet   CMP14+EGFR   Lipid Panel With LDL/HDL Ratio       Meds ordered this encounter  Medications  . blood  glucose meter kit and supplies    Sig: Dispense based on patient and insurance preference. Use to monitor blood sugar daily. E11.65    Dispense:  1 each    Refill:  0    Order Specific Question:   Number of strips    Answer:   100    Order Specific Question:   Number of lancets    Answer:   [place], NP

## 2020-08-21 NOTE — Assessment & Plan Note (Signed)
-  BP elevated today -will changed meds if still elevated at next visit

## 2020-08-21 NOTE — Patient Instructions (Signed)
Diabetes Mellitus and Nutrition, Adult When you have diabetes, or diabetes mellitus, it is very important to have healthy eating habits because your blood sugar (glucose) levels are greatly affected by what you eat and drink. Eating healthy foods in the right amounts, at about the same times every day, can help you:  Control your blood glucose.  Lower your risk of heart disease.  Improve your blood pressure.  Reach or maintain a healthy weight. What can affect my meal plan? Every person with diabetes is different, and each person has different needs for a meal plan. Your health care provider may recommend that you work with a dietitian to make a meal plan that is best for you. Your meal plan may vary depending on factors such as:  The calories you need.  The medicines you take.  Your weight.  Your blood glucose, blood pressure, and cholesterol levels.  Your activity level.  Other health conditions you have, such as heart or kidney disease. How do carbohydrates affect me? Carbohydrates, also called carbs, affect your blood glucose level more than any other type of food. Eating carbs naturally raises the amount of glucose in your blood. Carb counting is a method for keeping track of how many carbs you eat. Counting carbs is important to keep your blood glucose at a healthy level, especially if you use insulin or take certain oral diabetes medicines. It is important to know how many carbs you can safely have in each meal. This is different for every person. Your dietitian can help you calculate how many carbs you should have at each meal and for each snack. How does alcohol affect me? Alcohol can cause a sudden decrease in blood glucose (hypoglycemia), especially if you use insulin or take certain oral diabetes medicines. Hypoglycemia can be a life-threatening condition. Symptoms of hypoglycemia, such as sleepiness, dizziness, and confusion, are similar to symptoms of having too much  alcohol.  Do not drink alcohol if: ? Your health care provider tells you not to drink. ? You are pregnant, may be pregnant, or are planning to become pregnant.  If you drink alcohol: ? Do not drink on an empty stomach. ? Limit how much you use to:  0-1 drink a day for women.  0-2 drinks a day for men. ? Be aware of how much alcohol is in your drink. In the [place] one drink equals one 12 oz bottle of beer (355 mL), one 5 oz glass of wine (148 mL), or one 1 oz glass of hard liquor (44 mL). ? Keep yourself hydrated with water, diet soda, or unsweetened iced tea.  Keep in mind that regular soda, juice, and other mixers may contain a lot of sugar and must be counted as carbs. What are tips for following this plan? Reading food labels  Start by checking the serving size on the "Nutrition Facts" label of packaged foods and drinks. The amount of calories, carbs, fats, and other nutrients listed on the label is based on one serving of the item. Many items contain more than one serving per package.  Check the total grams (g) of carbs in one serving. You can calculate the number of servings of carbs in one serving by dividing the total carbs by 15. For example, if a food has 30 g of total carbs per serving, it would be equal to 2 servings of carbs.  Check the number of grams (g) of saturated fats and trans fats in one serving. Choose foods that have   a low amount or none of these fats.  Check the number of milligrams (mg) of salt (sodium) in one serving. Most people should limit total sodium intake to less than 2,300 mg per day.  Always check the nutrition information of foods labeled as "low-fat" or "nonfat." These foods may be higher in added sugar or refined carbs and should be avoided.  Talk to your dietitian to identify your daily goals for nutrients listed on the label. Shopping  Avoid buying canned, pre-made, or processed foods. These foods tend to be high in fat, sodium, and added  sugar.  Shop around the outside edge of the grocery store. This is where you will most often find fresh fruits and vegetables, bulk grains, fresh meats, and fresh dairy. Cooking  Use low-heat cooking methods, such as baking, instead of high-heat cooking methods like deep frying.  Cook using healthy oils, such as olive, canola, or sunflower oil.  Avoid cooking with butter, cream, or high-fat meats. Meal planning  Eat meals and snacks regularly, preferably at the same times every day. Avoid going long periods of time without eating.  Eat foods that are high in fiber, such as fresh fruits, vegetables, beans, and whole grains. Talk with your dietitian about how many servings of carbs you can eat at each meal.  Eat 4-6 oz (112-168 g) of lean protein each day, such as lean meat, chicken, fish, eggs, or tofu. One ounce (oz) of lean protein is equal to: ? 1 oz (28 g) of meat, chicken, or fish. ? 1 egg. ?  cup (62 g) of tofu.  Eat some foods each day that contain healthy fats, such as avocado, nuts, seeds, and fish.   What foods should I eat? Fruits Berries. Apples. Oranges. Peaches. Apricots. Plums. Grapes. Mango. Papaya. Pomegranate. Kiwi. Cherries. Vegetables Lettuce. Spinach. Leafy greens, including kale, chard, collard greens, and mustard greens. Beets. Cauliflower. Cabbage. Broccoli. Carrots. Green beans. Tomatoes. Peppers. Onions. Cucumbers. Brussels sprouts. Grains Whole grains, such as whole-wheat or whole-grain bread, crackers, tortillas, cereal, and pasta. Unsweetened oatmeal. Quinoa. Brown or wild rice. Meats and other proteins Seafood. Poultry without skin. Lean cuts of poultry and beef. Tofu. Nuts. Seeds. Dairy Low-fat or fat-free dairy products such as milk, yogurt, and cheese. The items listed above may not be a complete list of foods and beverages you can eat. Contact a dietitian for more information. What foods should I avoid? Fruits Fruits canned with  syrup. Vegetables Canned vegetables. Frozen vegetables with butter or cream sauce. Grains Refined white flour and flour products such as bread, pasta, snack foods, and cereals. Avoid all processed foods. Meats and other proteins Fatty cuts of meat. Poultry with skin. Breaded or fried meats. Processed meat. Avoid saturated fats. Dairy Full-fat yogurt, cheese, or milk. Beverages Sweetened drinks, such as soda or iced tea. The items listed above may not be a complete list of foods and beverages you should avoid. Contact a dietitian for more information. Questions to ask a health care provider  Do I need to meet with a diabetes educator?  Do I need to meet with a dietitian?  What number can I call if I have questions?  When are the best times to check my blood glucose? Where to find more information:  American Diabetes Association: diabetes.org  Academy of Nutrition and Dietetics: www.eatright.org  National Institute of Diabetes and Digestive and Kidney Diseases: www.niddk.nih.gov  Association of Diabetes Care and Education Specialists: www.diabeteseducator.org Summary  It is important to have healthy eating   habits because your blood sugar (glucose) levels are greatly affected by what you eat and drink.  A healthy meal plan will help you control your blood glucose and maintain a healthy lifestyle.  Your health care provider may recommend that you work with a dietitian to make a meal plan that is best for you.  Keep in mind that carbohydrates (carbs) and alcohol have immediate effects on your blood glucose levels. It is important to count carbs and to use alcohol carefully. This information is not intended to replace advice given to you by your health care provider. Make sure you discuss any questions you have with your health care provider. Document Revised: 04/24/2019 Document Reviewed: 04/24/2019 Elsevier Patient Education  2021 [place].  Diabetes Mellitus and  Exercise Exercising regularly is important for overall health, especially for people who have diabetes mellitus. Exercising is not only about losing weight. It has many other health benefits, such as increasing muscle strength and bone density and reducing body fat and stress. This leads to improved fitness, flexibility, and endurance, all of which result in better overall health. What are the benefits of exercise if I have diabetes? Exercise has many benefits for people with diabetes. They include:  Helping to lower and control blood sugar (glucose).  Helping the body to respond better to the hormone insulin by improving insulin sensitivity.  Reducing how much insulin the body needs.  Lowering the risk for heart disease by: ? Lowering "bad" cholesterol and triglyceride levels. ? Increasing "good" cholesterol levels. ? Lowering blood pressure. ? Lowering blood glucose levels. What is my activity plan? Your health care provider or certified diabetes educator can help you make a plan for the type and frequency of exercise that works for you. This is called your activity plan. Be sure to:  Get at least 150 minutes of medium-intensity or high-intensity exercise each week. Exercises may include brisk walking, biking, or water aerobics.  Do stretching and strengthening exercises, such as yoga or weight lifting, at least 2 times a week.  Spread out your activity over at least 3 days of the week.  Get some form of physical activity each day. ? Do not go more than 2 days in a row without some kind of physical activity. ? Avoid being inactive for more than 90 minutes at a time. Take frequent breaks to walk or stretch.  Choose exercises or activities that you enjoy. Set realistic goals.  Start slowly and gradually increase your exercise intensity over time.   How do I manage my diabetes during exercise? Monitor your blood glucose  Check your blood glucose before and after exercising. If your  blood glucose is: ? 240 mg/dL (13.3 mmol/L) or higher before you exercise, check your urine for ketones. These are chemicals created by the liver. If you have ketones in your urine, do not exercise until your blood glucose returns to normal. ? 100 mg/dL (5.6 mmol/L) or lower, eat a snack containing 15-20 grams of carbohydrate. Check your blood glucose 15 minutes after the snack to make sure that your glucose level is above 100 mg/dL (5.6 mmol/L) before you start your exercise.  Know the symptoms of low blood glucose (hypoglycemia) and how to treat it. Your risk for hypoglycemia increases during and after exercise. Follow these tips and your health care provider's instructions  Keep a carbohydrate snack that is fast-acting for use before, during, and after exercise to help prevent or treat hypoglycemia.  Avoid injecting insulin into areas of the  body that are going to be exercised. For example, avoid injecting insulin into: ? Your arms, when you are about to play tennis. ? Your legs, when you are about to go jogging.  Keep records of your exercise habits. Doing this can help you and your health care provider adjust your diabetes management plan as needed. Write down: ? Food that you eat before and after you exercise. ? Blood glucose levels before and after you exercise. ? The type and amount of exercise you have done.  Work with your health care provider when you start a new exercise or activity. He or she may need to: ? Make sure that the activity is safe for you. ? Adjust your insulin, other medicines, and food that you eat.  Drink plenty of water while you exercise. This prevents loss of water (dehydration) and problems caused by a lot of heat in the body (heat stroke).   Where to find more information  American Diabetes Association: www.diabetes.org Summary  Exercising regularly is important for overall health, especially for people who have diabetes mellitus.  Exercising has many  health benefits. It increases muscle strength and bone density and reduces body fat and stress. It also lowers and controls blood glucose.  Your health care provider or certified diabetes educator can help you make an activity plan for the type and frequency of exercise that works for you.  Work with your health care provider to make sure any new activity is safe for you. Also work with your health care provider to adjust your insulin, other medicines, and the food you eat. This information is not intended to replace advice given to you by your health care provider. Make sure you discuss any questions you have with your health care provider. Document Revised: 02/12/2019 Document Reviewed: 02/12/2019 Elsevier Patient Education  [place].

## 2020-08-27 DIAGNOSIS — D225 Melanocytic nevi of trunk: Secondary | ICD-10-CM | POA: Diagnosis not present

## 2020-08-27 DIAGNOSIS — D485 Neoplasm of uncertain behavior of skin: Secondary | ICD-10-CM | POA: Diagnosis not present

## 2020-08-27 DIAGNOSIS — Z08 Encounter for follow-up examination after completed treatment for malignant neoplasm: Secondary | ICD-10-CM | POA: Diagnosis not present

## 2020-08-27 DIAGNOSIS — D2272 Melanocytic nevi of left lower limb, including hip: Secondary | ICD-10-CM | POA: Diagnosis not present

## 2020-08-27 DIAGNOSIS — Z85828 Personal history of other malignant neoplasm of skin: Secondary | ICD-10-CM | POA: Diagnosis not present

## 2020-08-27 DIAGNOSIS — Z1283 Encounter for screening for malignant neoplasm of skin: Secondary | ICD-10-CM | POA: Diagnosis not present

## 2020-08-28 ENCOUNTER — Encounter: Payer: Self-pay | Admitting: Neurology

## 2020-08-28 ENCOUNTER — Ambulatory Visit: Payer: PPO | Admitting: Neurology

## 2020-08-28 VITALS — BP 134/84 | HR 61 | Ht 67.0 in | Wt 211.3 lb

## 2020-08-28 DIAGNOSIS — R0683 Snoring: Secondary | ICD-10-CM

## 2020-08-28 DIAGNOSIS — G9608 Other cranial cerebrospinal fluid leak: Secondary | ICD-10-CM

## 2020-08-28 DIAGNOSIS — I482 Chronic atrial fibrillation, unspecified: Secondary | ICD-10-CM | POA: Diagnosis not present

## 2020-08-28 DIAGNOSIS — E669 Obesity, unspecified: Secondary | ICD-10-CM | POA: Diagnosis not present

## 2020-08-28 DIAGNOSIS — R519 Headache, unspecified: Secondary | ICD-10-CM | POA: Diagnosis not present

## 2020-08-28 DIAGNOSIS — I671 Cerebral aneurysm, nonruptured: Secondary | ICD-10-CM | POA: Diagnosis not present

## 2020-08-28 DIAGNOSIS — Z8679 Personal history of other diseases of the circulatory system: Secondary | ICD-10-CM | POA: Diagnosis not present

## 2020-08-28 DIAGNOSIS — R351 Nocturia: Secondary | ICD-10-CM

## 2020-08-28 NOTE — Patient Instructions (Addendum)
It was nice to meet you today.  Your headaches could be from more than one reason, prior head injury is one possibility, underlying sleep apnea is not fully excluded, sleep deprivation is another trigger.  Blood pressure fluctuations could also be a cause as well as tension headaches.  Your history does not suggest migraines.  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  I think it would be worthwhile rechecking your brain scans with a brain MRI as well as MR angiogram.  This is a specific MRI to look at the brain blood vessels.  I would like to suggest a sleep study to rule out underlying sleep apnea, we could do a home sleep test even.  Given your history of snoring, sleep disruption from repeated nighttime urination and your chronic atrial fibrillation, ruling out underlying sleep apnea would be helpful, untreated sleep apnea can cause recurrent headaches as well.  We will call you to schedule your brain MRI and MR angiogram of the head.  If you change your mind about your sleep evaluation, please call us back anytime.  We will call you with your scan results and make a follow-up appointment accordingly.

## 2020-08-28 NOTE — Progress Notes (Addendum)
Subjective:    Patient ID: AMOL DOMANSKI is a 80 y.o. male.  HPI     Star Age, MD, PhD [facility] [address], [address] [place], [place] [address]  Dear Broadus John,   I saw your patient, Mounir Skipper, upon your kind request in my neurologic clinic today for initial consultation of his recurrent headaches.  The patient is unaccompanied today.  As you know, Mr. 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 He reports that he has had an intermittent headaches for about 5 years.  It is mostly in the left frontal area and sometimes feels like a sinus congestion.  He reports chronic sinus issues.  He reports having had a sleep study some 5+ years ago I reviewed the office note from 06/25/2020.  He saw Dr. Newman Pies in the past.  He had a brain MRI without contrast on 02/19/2015 and I reviewed the results: IMPRESSION: 1. 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 No midline shift. 2. 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 Consider MRA or CTA for further evaluation.  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 Each of these subdural hygromas causes apparent mass effect on the respective frontal lobe. No  acute hemorrhage intra-axially or extra- axially is seen. No acute infarct evident. No midline shift. There is no mass or intracranial edema.  He has had an elevated A1c and he was started on Metformin.  He has had elevated blood pressure values and medication change was considered.   He had recent blood work on 08/18/2020 and I reviewed the results: A1c was 7.2, CMP showed BUN of 13, creatinine 1.19, glucose 148, alk phos was 41, AST and ALT normal..  Lipid panel showed total cholesterol of 141, triglycerides 82, HDL 60, LDL 65.  CBC showed RBC of 4.03, hemoglobin 10.8, hematocrit 32.7, both below normal.  He reports that he is scheduled for an upper GI endoscopy and they may have to do esophageal stretching which he has had before.  He reports occasional nausea with his headache and taking meclizine has helped.  He takes meclizine as needed for his Mnire's and has been on it for years.  He does not have a prior history of migraines. He hydrates well with water.  He drinks decaf coffee, 1 cup in the morning.  He lives with his wife, he has grown children in [place] in [place]. He had a sleep study some 5+ [facility] ago [facility].  He reports that he was not diagnosed with sleep apnea.  He does have nocturia about 4 times per average night.  He denies recurrent morning headaches and his headaches are generally more in the afternoons and evenings.  He denies any sudden onset of one-sided weakness or numbness or tingling or droopy face or slurring of speech.  He takes over-the-counter Tylenol mostly for his headache.  Sometimes the  headache comes for 7 or 10 days and then goes away for several days, sometimes weeks.  He does not have any autonomic symptoms with a headache.  No prior history of migraines.  I reviewed his sleep study report from 07/09/2013, study was interpreted by Dr. Merlene Laughter.  Overall AHI was 8/h, supine AHI was 33/h, average oxygen saturation was 96%, nadir was 88%.  His  Past Medical History Is Significant For: Past Medical History:  Diagnosis Date  . AAA (abdominal aortic aneurysm) ([place])   . AKI (acute kidney injury) ([place]) 04/04/2016  . Aneurysm ([place])   . 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 Chest pain 07/17/2011   Not clearly of cardiac origin.  Will rx empirically with a PPI and assess response.  Admitted to [facility] in 06/2011; echo-moderate LVH, normal EF, mild aortic root dilatation; stress nuclear-normal perfusion, normal EF.   . Colon polyps   . COPD (chronic obstructive pulmonary disease) ([place])   . COVID-19   . Degenerative joint disease    had disc fusion in the past  . Hearing deficit, bilateral    left worse than right  . History of blood transfusion 12/2007   "bleeding for Warfarin"  . Hyperlipidemia   . Hypertension    Admitted for chest pain and 06/2011-neg stress Myoview,mod. LVH with nl EF on echo; TSH of 3.75  . Hyponatremia 04/04/2016  . Meniere's disease   . Nephrolithiasis   . 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 Permanent atrial fibrillation ([place]    a. s/p Watchman  . Pneumonia   . Renal hemorrhage, right 2009   2009 while anticoagulated with Coumadin; treated with the renal artery embolization  . Renal hemorrhage, right    2009 while anticoagulated with Coumadin; treated with the renal artery embolization   . Sepsis ([place] 04/04/2016    His Past Surgical History Is Significant For: Past Surgical History:  Procedure Laterality Date  . ABDOMINAL AORTIC ENDOVASCULAR FENESTRATED STENT GRAFT N/A 05/08/2020   Procedure: ABDOMINAL AORTIC ENDOVASCULAR FENESTRATED STENT GRAFT;  Surgeon: CMarty Heck MD;  [facility]  Service: Vascular;  Laterality: N/A;  . BIOPSY  02/21/2017   Procedure: BIOPSY;  Surgeon: FDanie Binder MD;  [facility];  Service: Endoscopy;;  gastric  . COLONOSCOPY N/A 02/21/2017   multiple polyps, one requiring piecemeal removal with epi and  clip placement, multiple small and large mouthed diverticula, recto-sigmoid and sigmoid with significant excess looping, internal hemorrhoids. Path with one advanced adenoma, 3 simple adenomas, 2 hyperplastic polyps. Survelilance 3 years   . EMBOLIZATION  12/2007   Left renal hematoma, status post angiogram and embolization/notes 09/29/2010  . ESOPHAGOGASTRODUODENOSCOPY N/A 02/21/2017   benign-appearing esophageal stenosis s/p dilation, gastritis, scarred mucosa in pylorus due to prior PUD. reactive gastropathy on path  . FRACTURE SURGERY    . LEFT ATRIAL APPENDAGE OCCLUSION N/A 03/18/2016   Procedure: LEFT ATRIAL APPENDAGE OCCLUSION;  Surgeon: JThompson Grayer MD;  [facility];  Service: Cardiovascular;  Laterality: N/A;  . ORIF FEMUR FRACTURE Right 1960   "2 steel rods in leg"  . PATCH ANGIOPLASTY Left 05/08/2020   Procedure: PATCH ANGIOPLASTY LEFT FEMORAL ARTERY;  Surgeon: CMarty Heck MD;  [facility]  Service: Vascular;  Laterality: Left;  . PILONIDAL CYST EXCISION    . POLYPECTOMY  02/21/2017   Procedure: POLYPECTOMY;  Surgeon: FDanie Binder MD;  [facility];  Service: Endoscopy;;  colon  . REPAIR ILIAC ARTERY Left 05/08/2020   Procedure: EXPLORATION AND REPAIR LEFT FEMORAL ARTERY;  Surgeon: [name], MD;  Location: [facility];  Service: Vascular;  Laterality: Left;  . SAVORY DILATION N/A 02/21/2017   Procedure: SAVORY DILATION;  Surgeon: Danie Binder, MD;  [facility];  Service: Endoscopy;  Laterality: N/A;  . TEE WITHOUT CARDIOVERSION N/A 03/10/2016   Procedure: TRANSESOPHAGEAL ECHOCARDIOGRAM (TEE);  Surgeon: Josue Hector, MD;  Location: [facility];  Service: Cardiovascular;  Laterality: N/A;  . TEE WITHOUT CARDIOVERSION N/A 04/30/2016   Procedure: TRANSESOPHAGEAL ECHOCARDIOGRAM (TEE);  Surgeon: Josue Hector, MD;  Location: [facility] END[facility];  Service: Cardiovascular;  Laterality: N/A;  . ULTRASOUND GUIDANCE FOR VASCULAR ACCESS  Bilateral 05/08/2020   Procedure: ULTRASOUND GUIDANCE FOR VASCULAR ACCESS, bilateral femoral arteries;  Surgeon: [name], MD;  Location: [facility];  Service: Vascular;  Laterality: Bilateral;  . Watchman procedure  02/2016    His Family History Is Significant For: Family History  Problem Relation Age of Onset  . Hypertension Mother   . Hyperlipidemia Mother   . Renal cancer Mother   . Atrial fibrillation Sister   . Stroke Father   . Dementia Father   . Colon cancer Cousin   . Heart disease Brother   . Colon polyps Neg Hx     His Social History Is Significant For: Social History   Socioeconomic History  . Marital status: Married    Spouse name: Not on file  . Number of children: 2  . Years of education: Not on file  . Highest education level: Not on file  Occupational History  . Occupation: Retired    Comment: Chief Technology Officer job  Tobacco Use  . Smoking status: Former Smoker    Packs/day: 1.00    Years: 40.00    Pack years: 40.00    Quit date: 01/20/2008    Years since quitting: 12.6  . Smokeless tobacco: Never Used  Vaping Use  . Vaping Use: Never used  Substance and Sexual Activity  . Alcohol use: Not Currently    Alcohol/week: 1.0 standard drink    Types: 1 Standard drinks or equivalent per week    Comment: rare wine  . Drug use: No  . Sexual activity: Yes  Other Topics Concern  . Not on file  Social History Narrative   Retired from Architect   Lives in [place] Strain: Not on file  Food Insecurity: Not on file  Transportation Needs: Not on file  Physical Activity: Not on file  Stress: Not on file  Social Connections: Not on file    His Allergies Are:  Allergies  Allergen Reactions  . Other Swelling  . Statins Other (See Comments)    Muscle aches Muscle aches  . Rivaroxaban Other (See Comments)    Developed redness in both legs Developed redness in both legs  :    His Current Medications Are:  Outpatient Encounter Medications as of 08/28/2020  Medication Sig  . acetaminophen (TYLENOL) 500 MG tablet Take 1,000 mg by mouth every 8 (eight) hours as needed (pain).  Marland Kitchen albuterol (VENTOLIN HFA) 108 (90 Base) MCG/ACT inhaler Inhale 1-2 puffs into the lungs every 6 (six) hours as needed for wheezing or shortness of breath.  Marland Kitchen ascorbic acid (VITAMIN C) 500 MG tablet Take 2 tablets by mouth as needed.  Marland Kitchen aspirin EC 81 MG tablet Take  81 mg by mouth daily. Swallow whole.  . benzonatate (TESSALON) 100 MG capsule Take 1 capsule (100 mg total) by mouth 2 (two) times daily as needed for cough.  . blood glucose meter kit and supplies Dispense based on patient and insurance preference. Use to monitor blood sugar daily. E11.65  . cetirizine (ZYRTEC) 10 MG tablet Take 10 mg by mouth at bedtime.   . cholecalciferol (VITAMIN D3) 25 MCG (1000 UNIT) tablet Take 1,000 Units by mouth daily.  . clopidogrel (PLAVIX) 75 MG tablet Take 1 tablet (75 mg total) by mouth daily at 6 (six) AM.  . diltiazem (CARDIZEM CD) 240 MG 24 hr capsule Take 1 capsule (240 mg total) by mouth at bedtime.  . fluticasone (FLONASE) 50 MCG/ACT nasal spray Place 1 spray into both nostrils every morning.   Marland Kitchen LORazepam (ATIVAN) 0.5 MG tablet Take 1 tablet (0.5 mg total) by mouth at bedtime.  . meclizine (ANTIVERT) 25 MG tablet Take 25 mg by mouth 2 (two) times daily. For dizziness May take an additional dose at [place] if needed  . metFORMIN (GLUCOPHAGE) 500 MG tablet Take 1 tablet (500 mg total) by mouth daily with breakfast.  . metoprolol succinate (TOPROL-XL) 25 MG 24 hr tablet Take 0.5 tablets (12.5 mg total) by mouth daily.  . mirabegron ER (MYRBETRIQ) 50 MG TB24 tablet Take 1 tablet by mouth at bedtime.  . Multiple Vitamin (MULTIVITAMIN) tablet Take 1 tablet by mouth daily.  . pantoprazole (PROTONIX) 40 MG tablet Take 1 tablet (40 mg total) by mouth 2 (two) times daily.  . rosuvastatin (CRESTOR) 5 MG  tablet Take 1 tablet (5 mg total) by mouth every evening.  . tamsulosin (FLOMAX) 0.4 MG CAPS capsule Take 0.4 mg by mouth in the morning and at bedtime.   . triamterene-hydrochlorothiazide (MAXZIDE-25) 37.5-25 MG tablet Take 1 tablet by mouth daily.   No facility-administered encounter medications on file as of 08/28/2020.  :   Review of Systems:  Out of a complete 14 point review of systems, all are reviewed and negative with the exception of these symptoms as listed below:  Review of Systems  Neurological:       Here for consult on worsening h/a. Pt reports the h/a will come in clusters. Pt reports hx of concussion. Pt reports he will typically take OTC tylenol and PRN meclizine due to dizziness/nausea associated with the h/a. Reports sx are mostly on the left side of his head and are worst in the afternoon/evening. Hx of Meniere's disease.    Objective:  Neurological Exam  Physical Exam Physical Examination:   Vitals:   08/28/20 1438  BP: 134/84  Pulse: 61  SpO2: 97%    General Examination: The patient is a very pleasant 80 y.o. male in no acute distress. He appears well-developed and well-nourished and well groomed.   HEENT: Normocephalic, atraumatic, pupils are equal, round and reactive to light on the right, left eye exotropia/lazy eye is noted, bilateral cataracts noted.  Extraocular tracking is well-preserved on the right side.  Hearing is grossly intact with bilateral hearing aids.  Face is symmetric with normal facial animation.  Airway examination reveals mild to moderate mouth dryness, small airway entry, small tonsils, tongue protrudes centrally and palate elevates symmetrically.  No carotid bruits.  Neck is supple with full range of motion, no vertiginous symptoms currently.  No photophobia.  Chest: Clear to auscultation without wheezing, rhonchi or crackles noted.  Heart: S1+S2+0, mildly bradycardic, slightly irregular.     Abdomen:  Soft, non-tender and  non-distended with normal bowel sounds appreciated on auscultation.  Extremities: There is trace pitting edema in the distal lower extremities bilaterally, more noticeable on the left than right.  Skin: Warm and dry without trophic changes noted.  Musculoskeletal: exam reveals no obvious joint deformities, tenderness or joint swelling or erythema.   Neurologically:  Mental status: The patient is awake, alert and oriented in all 4 spheres. His immediate and remote memory, attention, language skills and fund of knowledge are appropriate. There is no evidence of aphasia, agnosia, apraxia or anomia. Speech is clear with normal prosody and enunciation. Thought process is linear. Mood is normal and affect is normal.  Cranial nerves II - XII are as described above under HEENT exam. In addition: shoulder shrug is normal with equal shoulder height noted. Motor exam: Normal bulk, strength and tone is noted. There is no drift, tremor or rebound. Romberg is not tested for safety concerns.  Reflexes are 1+ in the upper extremities and trace in the knees, absent in the ankles fine motor skills are generally fairly well-preserved in the upper and lower extremities.    Cerebellar testing: No dysmetria or intention tremor.  Sensory exam: intact to light touch in the upper and lower extremities.  Gait, station and balance: He stands easily. No veering to one side is noted. No leaning to one side is noted. Posture is age-appropriate and stance is narrow based. Gait shows normal stride length and normal pace. No problems turning are noted.                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  History is not telltale for migraines or cluster headaches.  Headache could be secondary to sleep deprivation, underlying sleep disordered breathing is not fully excluded, tension headaches, blood pressure fluctuation, prior history of head injury and subdural hematoma.  He was also suspected to have an anterior communicating artery aneurysm which has not had any follow-up scans as I understand.  He had seen neurosurgeon, Dr. Arnoldo Morale in the past but essentially not since 2016 as I understand.  The patient is generally managing with as needed medication over-the-counter but is advised to proceed with further evaluation to rule out a organic and structural cause of his headaches.  To that end, I would like to proceed with a brain MRI with and without contrast and an MR angiogram of the head.  He is agreeable to this.  I also suggested we proceed with sleep evaluation with a sleep study.  He is currently not in favor of doing a sleep study as he feels that his sleep is not a problem.  I advised him that recurrent headaches, history of heart disease and especially chronic A. fib could tie in with his sleep if he has sleep apnea.  Especially since he also has significant nocturia.  I offered him a home sleep test as well which she declines at this time.  He is  advised to call our office if he changes his mind about sleep testing.  He is advised to stay well-hydrated and well rested and we will call him with his brain MRI and MR angiogram results and make a follow-up appointment accordingly.  I answered all his questions today and the patient was in agreement. Thank you very much for allowing me to participate in the care of  this nice patient. If I can be of any further assistance to you please do not hesitate to call me at [phone].  Sincerely,   Star Age, MD, PhD

## 2020-09-01 ENCOUNTER — Telehealth: Payer: Self-pay | Admitting: Neurology

## 2020-09-01 NOTE — Telephone Encounter (Signed)
health team order sent to GI. They will reach out to the patient to schedule.

## 2020-09-11 ENCOUNTER — Ambulatory Visit (HOSPITAL_COMMUNITY)
Admission: RE | Admit: 2020-09-11 | Discharge: 2020-09-11 | Disposition: A | Discharge status: Home / Self Care | Payer: PPO | Source: Ambulatory Visit | Attending: Gastroenterology | Admitting: Gastroenterology

## 2020-09-11 DIAGNOSIS — K2970 Gastritis, unspecified, without bleeding: Secondary | ICD-10-CM

## 2020-09-11 DIAGNOSIS — R1310 Dysphagia, unspecified: Secondary | ICD-10-CM | POA: Diagnosis not present

## 2020-09-11 DIAGNOSIS — R1319 Other dysphagia: Secondary | ICD-10-CM | POA: Diagnosis not present

## 2020-09-15 ENCOUNTER — Ambulatory Visit
Admission: RE | Admit: 2020-09-15 | Discharge: 2020-09-15 | Disposition: A | Discharge status: Home / Self Care | Payer: PPO | Source: Ambulatory Visit | Attending: Neurology | Admitting: Neurology

## 2020-09-15 DIAGNOSIS — E669 Obesity, unspecified: Secondary | ICD-10-CM

## 2020-09-15 DIAGNOSIS — Z8679 Personal history of other diseases of the circulatory system: Secondary | ICD-10-CM

## 2020-09-15 DIAGNOSIS — R351 Nocturia: Secondary | ICD-10-CM

## 2020-09-15 DIAGNOSIS — I671 Cerebral aneurysm, nonruptured: Secondary | ICD-10-CM

## 2020-09-15 DIAGNOSIS — G9608 Other cranial cerebrospinal fluid leak: Secondary | ICD-10-CM

## 2020-09-15 DIAGNOSIS — R519 Headache, unspecified: Secondary | ICD-10-CM

## 2020-09-15 DIAGNOSIS — R0683 Snoring: Secondary | ICD-10-CM

## 2020-09-15 DIAGNOSIS — I482 Chronic atrial fibrillation, unspecified: Secondary | ICD-10-CM

## 2020-09-16 ENCOUNTER — Telehealth: Payer: Self-pay | Admitting: Neurology

## 2020-09-16 MED ORDER — ALPRAZOLAM 0.5 MG PO TABS: ORAL_TABLET | ORAL | 0 refills | Status: DC

## 2020-09-16 NOTE — Telephone Encounter (Signed)
[name] everything done updated .   Patient aware of details rescheduling of his apt .  PT aware of everything he is suppose to do .  [facility] [place]   has talked to him about RX .

## 2020-09-16 NOTE — Telephone Encounter (Signed)
We received a second voicemail from this patient regarding r/s his MRI

## 2020-09-16 NOTE — Telephone Encounter (Signed)
II have called pt and discussed. He is aware of xanax rx along with calling [facility] directly to reschedule study.

## 2020-09-16 NOTE — Telephone Encounter (Signed)
Patient called stating he had MRI scheduled for yesterday and he had to cancel them as he got very claustrophobic. Patient is requesting a sedative so that he will be able to do the MRI. Thank you

## 2020-09-16 NOTE — Telephone Encounter (Signed)
I have ordered Xanax for patient's upcoming MRI due to anxiety/claustrophobia reported. Please inform patient or caregiver and remind them, that he should not drive after taking Xanax and have someone take him for the MRI appointment.  Please also advise patient that he has a prescription for Ativan on file which is another benzodiazepine medication.  If he takes his Xanax for the MRI, it is possible that taking the Ativan at bedtime can make him more sleepy than usual.

## 2020-09-16 NOTE — Telephone Encounter (Signed)
Pt advised of rx being sent and Dr. Guadelupe Sabin instructions with taking xanax with ativan.

## 2020-09-17 NOTE — Telephone Encounter (Signed)
Noted, thank you

## 2020-09-22 ENCOUNTER — Telehealth: Payer: Self-pay

## 2020-09-22 NOTE — Telephone Encounter (Signed)
[facility] Pre-operative Risk Assessment     Request for surgical clearance:     Endoscopy Procedure  What type of surgery is being performed?     Endoscopy with dilation/colonoscopy  When is this surgery scheduled?     12/02/20  What type of clearance is required ?   Pharmacy  Are there any medications that need to be held prior to surgery and how long? Plavix for 5 days  Practice name and name of physician performing surgery? [facility]  What is your office phone and fax number?      Phone- [phone]  [phone]  Anesthesia type (None, local, MAC, general) ?       MAC

## 2020-09-23 NOTE — Telephone Encounter (Signed)
Patient is on aspirin and Plavix as recommended by Dr. Carlis Abbott of vascular surgery.  He is not on Plavix due to cardiac reason.  Will defer clearance to Dr. Carlis Abbott. [place] signing off.

## 2020-09-23 NOTE — Telephone Encounter (Signed)
Dr. Carlis Abbott please advise if ok for pt to hold plavix for 5 days prior to [facility] scheduled on 12/02/20.

## 2020-09-30 NOTE — Telephone Encounter (Signed)
Verbal order from Dr. Carlis Abbott with [facility] for pt to hold plavix for 5 days prior to procedure, he would like him to stay on asa.

## 2020-09-30 NOTE — Progress Notes (Signed)
Subjective:   [name] is a 80 y.o. male who presents for an Initial Medicare Annual Wellness Visit.  Virtual Visit via Video Note  I connected with [name] by a video enabled telemedicine application and verified that I am speaking with the correct person using two identifiers.  Location: Patient: Home Provider: Office Persons participating in the virtual visit: patient, provider   I discussed the limitations of evaluation and management by telemedicine and the availability of in person appointments. The patient expressed understanding and agreed to proceed.     Larene Beach Karalynn Cottone,LPN    Review of Systems    N/A  Cardiac Risk Factors include: advanced age (>41mn, >>52women);male gender;dyslipidemia;diabetes mellitus;hypertension     Objective:    Today's Vitals   10/01/20 1423  PainSc: 0-No pain   There is no height or weight on file to calculate BMI.  Advanced Directives 10/01/2020 05/08/2020 05/05/2020 03/29/2018 03/29/2018 02/21/2017 06/29/2016  Does Patient Have a Medical Advance Directive? _0  Yes Yes  Type of AParamedicof [place]ild[place] ColonyLiving will HMauryLiving will HChandlerLiving will Living will Living will HJasperLiving will Living will;Healthcare Power of Attorney  Does patient want to make changes to medical advance directive? No - Patient declined No - Patient declined No - Patient declined - - - -  Copy of HStockwellin Chart? Yes - validated most recent copy scanned in chart (See row information) No - copy requested No - copy requested - - Yes No - copy requested  Pre-existing out of facility DNR order (yellow form or pink MOST form) - - - - - - -    Current Medications (verified) Outpatient Encounter Medications as of 10/01/2020  Medication Sig  . acetaminophen (TYLENOL) 500 MG tablet Take 1,000 mg by mouth every 8 (eight) hours as  needed (pain).  .Marland Kitchenalbuterol (VENTOLIN HFA) 108 (90 Base) MCG/ACT inhaler Inhale 1-2 puffs into the lungs every 6 (six) hours as needed for wheezing or shortness of breath.  .Marland Kitchenascorbic acid (VITAMIN C) 500 MG tablet Take 2 tablets by mouth as needed.  .Mar[place] Kitchenaspirin EC 81 MG tablet Take 81 mg by mouth daily. Swallow whole.  . blood glucose meter kit and supplies Dispense based on patient and insurance preference. Use to monitor blood sugar daily. E11.65  . cetirizine (ZYRTEC) 10 MG tablet Take 10 mg by mouth at bedtime.   . cholecalciferol (VITAMIN D3) 25 MCG (1000 UNIT) tablet Take 1,000 Units by mouth daily.  . clopidogrel (PLAVIX) 75 MG tablet Take 1 tablet (75 mg total) by mouth daily at 6 (six) AM.  . diltiazem (CARDIZEM CD) 240 MG 24 hr capsule Take 1 capsule (240 mg total) by mouth at bedtime.  . fluticasone (FLONASE) 50 MCG/ACT nasal spray Place 1 spray into both nostrils every morning.   .Marland KitchenLORazepam (ATIVAN) 0.5 MG tablet Take 1 tablet (0.5 mg total) by mouth at bedtime.  . meclizine (ANTIVERT) 25 MG tablet Take 25 mg by mouth 2 (two) times daily. For dizziness May [facility] an additional dose [facility]  . metFORMIN (GLUCOPHAGE) 500 MG tablet Take 1 tablet (500 mg total) by mouth daily with breakfast.  . metoprolol succinate (TOPROL-XL) 25 MG 24 hr tablet Take 0.5 tablets (12.5 mg total) by mouth daily.  . mirabegron ER (MYRBETRIQ) 50 MG TB24 tablet Take 1 tablet by mouth at bedtime.  . Multiple Vitamin (MULTIVITAMIN)  tablet Take 1 tablet by mouth daily.  . pantoprazole (PROTONIX) 40 MG tablet Take 1 tablet (40 mg total) by mouth 2 (two) times daily.  . rosuvastatin (CRESTOR) 5 MG tablet Take 1 tablet (5 mg total) by mouth every evening.  . tamsulosin (FLOMAX) 0.4 MG CAPS capsule Take 0.4 mg by mouth in the morning and at bedtime.   . triamterene-hydrochlorothiazide (MAXZIDE-25) 37.5-25 MG tablet Take 1 tablet by mouth daily.  Marland Kitchen ALPRAZolam (XANAX) 0.5 MG tablet Take 1-2 pills as needed on  call to MRI.  May take a 3rd pill if needed. (Patient not taking: Reported on 10/01/2020)  . [DISCONTINUED] benzonatate (TESSALON) 100 MG capsule Take 1 capsule (100 mg total) by mouth 2 (two) times daily as needed for cough.   No facility-administered encounter medications on file as of 10/01/2020.    Allergies (verified) Other, Statins, and Rivaroxaban   History: Past Medical History:  Diagnosis Date  . AAA (abdominal aortic aneurysm) ([place])   . AKI (acute kidney injury) ([place]) 04/04/2016  . Aneurysm ([place])   . 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 Chest pain 07/17/2011   Not clearly of cardiac origin.  Will rx empirically with a PPI and assess response.  Admitted to [facility] in 06/2011; echo-moderate LVH, normal EF, mild aortic root dilatation; stress nuclear-normal perfusion, normal EF.   . Colon polyps   . COPD (chronic obstructive pulmonary disease) ([place])   . COVID-19   . Degenerative joint disease    had disc fusion in the past  . Hearing deficit, bilateral    left worse than right  . History of blood transfusion 12/2007   "bleeding for Warfarin"  . Hyperlipidemia   . Hypertension    Admitted for chest pain and 06/2011-neg stress Myoview,mod. LVH with nl EF on echo; TSH of 3.75  . Hyponatremia 04/04/2016  . Meniere's disease   . Nephrolithiasis   . Peripheral vascular disease ([place])    Small AAA (3.7-4cm 09/2010), small SMA aneurysm (68m)  . Permanent atrial fibrillation ([place]    a. s/p Watchman  . Pneumonia   . Renal hemorrhage, right 2009   2009 while anticoagulated with Coumadin; treated with the renal artery embolization  . Renal hemorrhage, right    2009 while anticoagulated with Coumadin; treated with the renal artery embolization   . Sepsis ([place] 04/04/2016   Past Surgical History:  Procedure Laterality Date  . ABDOMINAL AORTIC ENDOVASCULAR FENESTRATED STENT GRAFT N/A 05/08/2020   Procedure: ABDOMINAL AORTIC ENDOVASCULAR FENESTRATED STENT GRAFT;  Surgeon: CMarty Heck MD;   [facility]  Service: Vascular;  Laterality: N/A;  . BIOPSY  02/21/2017   Procedure: BIOPSY;  Surgeon: FDanie Binder MD;  [facility];  Service: Endoscopy;;  gastric  . COLONOSCOPY N/A 02/21/2017   multiple polyps, one requiring piecemeal removal with epi and clip placement, multiple small and large mouthed diverticula, recto-sigmoid and sigmoid with significant excess looping, internal hemorrhoids. Path with one advanced adenoma, 3 simple adenomas, 2 hyperplastic polyps. Survelilance 3 years   . EMBOLIZATION  12/2007   Left renal hematoma, status post angiogram and embolization/notes 09/29/2010  . ESOPHAGOGASTRODUODENOSCOPY N/A 02/21/2017   benign-appearing esophageal stenosis s/p dilation, gastritis, scarred mucosa in pylorus due to prior PUD. reactive gastropathy on path  . FRACTURE SURGERY    . LEFT ATRIAL APPENDAGE OCCLUSION N/A 03/18/2016   Procedure: LEFT ATRIAL APPENDAGE OCCLUSION;  Surgeon: JThompson Grayer MD;  [facility];  Service: Cardiovascular;  Laterality: N/A;  . ORIF FEMUR FRACTURE Right 1960   "2 steel rods in leg"  . PATCH ANGIOPLASTY Left 05/08/2020   Procedure: PATCH ANGIOPLASTY LEFT FEMORAL ARTERY;  Surgeon: [name], MD;  Location: [place];  Service: Vascular;  Laterality: Left;  . PILONIDAL CYST EXCISION    . POLYPECTOMY  02/21/2017   Procedure: POLYPECTOMY;  Surgeon: Danie Binder, MD;  [facility];  Service: Endoscopy;;  colon  . REPAIR ILIAC ARTERY Left 05/08/2020   Procedure: EXPLORATION AND REPAIR LEFT FEMORAL ARTERY;  Surgeon: [name], MD;  Location: [place];  Service: Vascular;  Laterality: Left;  . SAVORY DILATION N/A 02/21/2017   Procedure: SAVORY DILATION;  Surgeon: Danie Binder, MD;  [facility];  Service: Endoscopy;  Laterality: N/A;  . TEE WITHOUT CARDIOVERSION N/A 03/10/2016   Procedure: TRANSESOPHAGEAL ECHOCARDIOGRAM (TEE);  Surgeon: Josue Hector, MD;  Location: [place];   Service: Cardiovascular;  Laterality: N/A;  . TEE WITHOUT CARDIOVERSION N/A 04/30/2016   Procedure: TRANSESOPHAGEAL ECHOCARDIOGRAM (TEE);  Surgeon: Josue Hector, MD;  Location: [facility] ENDOSCOPY;  Service: Cardiovascular;  Laterality: N/A;  . ULTRASOUND GUIDANCE FOR VASCULAR ACCESS Bilateral 05/08/2020   Procedure: ULTRASOUND GUIDANCE FOR VASCULAR ACCESS, bilateral femoral arteries;  Surgeon: [name], MD;  Location: [place];  Service: Vascular;  Laterality: Bilateral;  . Watchman procedure  02/2016   Family History  Problem Relation Age of Onset  . Hypertension Mother   . Hyperlipidemia Mother   . Renal cancer Mother   . Atrial fibrillation Sister   . Stroke Father   . Dementia Father   . Colon cancer Cousin   . Heart disease Brother   . Colon polyps Neg Hx    Social History   Socioeconomic History  . Marital status: Married    Spouse name: Not on file  . Number of children: 2  . Years of education: Not on file  . Highest education level: Not on file  Occupational History  . Occupation: Retired    Comment: Chief Technology Officer job  Tobacco Use  . Smoking status: Former Smoker    Packs/day: 1.00    Years: 40.00    Pack years: 40.00    Quit date: 01/20/2008    Years since quitting: 12.7  . Smokeless tobacco: Never Used  Vaping Use  . Vaping Use: Never used  Substance and Sexual Activity  . Alcohol use: Not Currently    Alcohol/week: 1.0 standard drink    Types: 1 Standard drinks or equivalent per week    Comment: rare wine  . Drug use: No  . Sexual activity: Yes  Other Topics Concern  . Not on file  Social History Narrative   Retired from Architect   Lives in [place] Strain: [place]   . Difficulty of Paying Living Expenses: Not hard at all  Food Insecurity: No Food Insecurity  . Worried About Charity fundraiser in the Last Year: Never true  . Ran Out of Food in the Last Year: Never true   Transportation Needs: No Transportation Needs  . Lack of Transportation (Medical): No  . Lack of Transportation (Non-Medical): No  Physical Activity: Insufficiently Active  . Days of Exercise per Week: 2 days  . Minutes of Exercise per Session: 40 min  Stress: No Stress Concern Present  . Feeling of Stress : Not at all  Social Connections: Socially Integrated  .  Frequency of Communication with Friends and Family: More than three times a week  . Frequency of Social Gatherings with Friends and Family: Twice a week  . Attends Religious Services: More than 4 times per year  . Active Member of Clubs or Organizations: Yes  . Attends Archivist Meetings: More than 4 times per year  . Marital Status: Married    Tobacco Counseling Counseling given: Not Answered   Clinical Intake:  Pre-visit preparation completed: Yes  Pain : No/denies pain Pain Score: 0-No pain     Nutritional Risks: None Diabetes: Yes CBG done?: No Did pt. bring in CBG monitor from home?: No  How often do you need to have someone help you when you read instructions, pamphlets, or other written materials from your doctor or pharmacy?: 1 - Never  Diabetic?Yes Nutrition Risk Assessment:  Has the patient had any N/V/D within the last 2 months?  No  Does the patient have any non-healing wounds?  No  Has the patient had any unintentional weight loss or weight gain?  No   Diabetes:  Is the patient diabetic?  Yes  If diabetic, was a CBG obtained today?  No  Did the patient bring in their glucometer from home?  No  How often do you monitor your CBG's? Patient states he checks his glucose once per day.   Financial Strains and Diabetes Management:  Are you having any financial strains with the device, your supplies or your medication? No .  Does the patient want to be seen by Chronic Care Management for management of their diabetes?  No  Would the patient like to be referred to a Nutritionist or for  Diabetic Management?  No   Diabetic Exams:  Diabetic Eye Exam: Overdue for diabetic eye exam. Pt has been advised about the importance in completing this exam. Patient advised to call and schedule an eye exam. Diabetic Foot Exam: Overdue, Pt has been advised about the importance in completing this exam. Pt is scheduled for diabetic foot exam on 11/20/2020.   Interpreter Needed?: No  Information entered by :: [place] of Daily Living In your present state of health, do you have any difficulty performing the following activities: 10/01/2020 05/05/2020  Hearing? N Y  Comment - bilateral hearing aids.  Vision? N N  Difficulty concentrating or making decisions? N N  Walking or climbing stairs? N N  Dressing or bathing? N N  Doing errands, shopping? N N  Preparing Food and eating ? N -  Using the Toilet? N -  In the past six months, have you accidently leaked urine? N -  Do you have problems with loss of bowel control? N -  Managing your Medications? N -  Managing your Finances? N -  Housekeeping or managing your Housekeeping? N -  Some recent data might be hidden    Patient Care Team: Noreene Larsson, NP as PCP - General (Nurse Practitioner) Jerline Pain, MD as PCP - Cardiology (Cardiology) Mal Misty, MD (Inactive) as Attending Physician (Vascular Surgery) Danie Binder, MD (Inactive) as Consulting Physician (Gastroenterology) Eloise Harman, DO as Consulting Physician (Internal Medicine)  Indicate any recent Medical Services you may have received from other than [facility] providers in the past year (date may be approximate).     Assessment:   This is a routine wellness examination for [facility].  Hearing/Vision screen  Hearing Screening   125Hz 250Hz 500Hz 1000Hz 2000Hz 3000Hz 4000Hz 6000Hz 8000Hz  Right ear:           Left ear:           Vision Screening Comments: Patient gets routine eye exams once per year. Currently has bilateral cataracts that are  not ready for extraction, wears glasses.  Dietary issues and exercise activities discussed: Current Exercise Habits: Home exercise routine, Type of exercise: treadmill;walking, Time (Minutes): 40, Frequency (Times/Week): 2, Weekly Exercise (Minutes/Week): 80, Intensity: Mild, Exercise limited by: cardiac condition(s)  Goals Addressed            This Visit's Progress   . Exercise 3x per week (30 min per time)      . Prevent falls      . Weight (lb) < 200 lb (90.7 kg)        Depression Screen PHQ 2/9 Scores 10/01/2020 08/21/2020 07/15/2020 06/25/2020  PHQ - 2 Score 0 0 0 0    Fall Risk Fall Risk  10/01/2020 08/21/2020 07/15/2020 06/25/2020 04/20/2019  Falls in the past year? 1 0 0 0 0  Comment - - - - Emmi Telephone Survey: data to providers prior to load  Number falls in past yr: 0 0 0 0 -  Injury with Fall? 0 0 0 0 -  Risk for fall due to : Impaired balance/gait No Fall Risks No Fall Risks No Fall Risks -  Follow up Falls evaluation completed;Falls prevention discussed Falls evaluation completed Falls evaluation completed Falls evaluation completed -    FALL RISK PREVENTION PERTAINING TO THE HOME:  Any stairs in or around the home? Yes  If so, are there any without handrails? No  Home free of loose throw rugs in walkways, pet beds, electrical cords, etc? Yes  Adequate lighting in your home to reduce risk of falls? Yes   ASSISTIVE DEVICES UTILIZED TO PREVENT FALLS:  Life alert? No  Use of a cane, walker or w/c? No  Grab bars in the bathroom? No  Shower chair or bench in shower? No  Elevated toilet seat or a handicapped toilet? No     Cognitive Function:        Immunizations Immunization History  Administered Date(s) Administered  . Moderna Sars-Covid-2 Vaccination 08/23/2019, 09/25/2019  . Pneumococcal Conjugate-13 10/02/2016  . Pneumococcal Polysaccharide-23 09/27/2017  . Td 11/24/1998  . Tdap 12/14/2016    TDAP status: Up to date  Flu Vaccine status: Due,  Education has been provided regarding the importance of this vaccine. Advised may receive this vaccine at local pharmacy or Health Dept. Aware to provide a copy of the vaccination record if obtained from local pharmacy or Health Dept. Verbalized acceptance and understanding.  Pneumococcal vaccine status: Up to date  Covid-19 vaccine status: Completed vaccines  Qualifies for Shingles Vaccine? Yes   Zostavax completed No   Shingrix Completed?: No.    Education has been provided regarding the importance of this vaccine. Patient has been advised to call insurance company to determine out of pocket expense if they have not yet received this vaccine. Advised may also receive vaccine at local pharmacy or Health Dept. Verbalized acceptance and understanding.  Screening Tests Health Maintenance  Topic Date Due  . FOOT EXAM  Never done  . OPHTHALMOLOGY EXAM  Never done  . URINE MICROALBUMIN  Never done  . COLONOSCOPY (Pts 45-82yr Insurance coverage will need to be confirmed)  02/22/2020  . COVID-19 Vaccine (3 - Booster for Moderna series) 03/26/2020  . INFLUENZA VACCINE  12/29/2020  . HEMOGLOBIN A1C  02/18/2021  .  TETANUS/TDAP  12/15/2026  . Hepatitis C Screening  Completed  . PNA vac Low Risk Adult  Completed  . HPV VACCINES  Aged Out    Health Maintenance  Health Maintenance Due  Topic Date Due  . FOOT EXAM  Never done  . OPHTHALMOLOGY EXAM  Never done  . URINE MICROALBUMIN  Never done  . COLONOSCOPY (Pts 45-42yr Insurance coverage will need to be confirmed)  02/22/2020  . COVID-19 Vaccine (3 - Booster for Moderna series) 03/26/2020    Colorectal cancer screening: Referral to GI placed 10/01/2020. Pt aware the office will call re: appt.  Lung Cancer Screening: (Low Dose CT Chest recommended if Age 78-80years, 30 pack-year currently smoking OR have quit w/in 15years.) does qualify.   Lung Cancer Screening Referral: No  Additional Screening:  Hepatitis C Screening: does qualify;  Completed 08/18/2020  Vision Screening: Recommended annual ophthalmology exams for early detection of glaucoma and other disorders of the eye. Is the patient up to date with their annual eye exam?  Yes  Who is the provider or what is the name of the office in which the patient attends annual eye exams? Dr. MSabra Heck If pt is not established with a provider, would they like to be referred to a provider to establish care? No .   Dental Screening: Recommended annual dental exams for proper oral hygiene  Community Resource Referral / Chronic Care /Management: CRR required this visit?  No   CCM required this visit?  No      Plan:     I have personally reviewed and noted the following in the patient's chart:   . Medical and social history . Use of alcohol, tobacco or illicit drugs  . Current medications and supplements including opioid prescriptions. Patient is not currently taking opioid prescriptions. . Functional ability and status . Nutritional status . Physical activity . Advanced directives . List of other physicians . Hospitalizations, surgeries, and ER visits in previous 12 months . Vitals . Screenings to include cognitive, depression, and falls . Referrals and appointments  In addition, I have reviewed and discussed with patient certain preventive protocols, quality metrics, and best practice recommendations. A written personalized care plan for preventive services as well as general preventive health recommendations were provided to patient.     SOfilia Neas LPN   [id number]  Nurse Notes: None

## 2020-10-01 ENCOUNTER — Ambulatory Visit (INDEPENDENT_AMBULATORY_CARE_PROVIDER_SITE_OTHER): Payer: PPO

## 2020-10-01 ENCOUNTER — Other Ambulatory Visit: Payer: Self-pay

## 2020-10-01 DIAGNOSIS — Z0000 Encounter for general adult medical examination without abnormal findings: Secondary | ICD-10-CM

## 2020-10-01 NOTE — Patient Instructions (Addendum)
[name] , Thank you for taking time to come for your Medicare Wellness Visit. I appreciate your ongoing commitment to your health goals. Please review the following plan we discussed and let me know if I can assist you in the future.   Screening recommendations/referrals: Colonoscopy: Currently due, please keep upcoming appointment in July  Recommended yearly ophthalmology/optometry visit for glaucoma screening and checkup Recommended yearly dental visit for hygiene and checkup  Vaccinations: Influenza vaccine: Patient declined Pneumococcal vaccine: Completed series  Tdap vaccine: Up to date, next due 12/15/2026 Shingles vaccine: Currently due for Shingrix, if you would like to receive we recommend that you do so at your local pharmacy.    Advanced directives: Copies on file   Conditions/risks identified: None   Next appointment: 11/20/2020 @ 2:00 PM with Demetrius Revel, NP  Preventive Care 65 Years and Older, Male Preventive care refers to lifestyle choices and visits with your health care provider that can promote health and wellness. What does preventive care include?  A yearly physical exam. This is also called an annual well check.  Dental exams once or twice a year.  Routine eye exams. Ask your health care provider how often you should have your eyes checked.  Personal lifestyle choices, including:  Daily care of your teeth and gums.  Regular physical activity.  Eating a healthy diet.  Avoiding tobacco and drug use.  Limiting alcohol use.  Practicing safe sex.  Taking low doses of aspirin every day.  Taking vitamin and mineral supplements as recommended by your health care provider. What happens during an annual well check? The services and screenings done by your health care provider during your annual well check will depend on your age, overall health, lifestyle risk factors, and family history of disease. Counseling  Your health care provider may ask you  questions about your:  Alcohol use.  Tobacco use.  Drug use.  Emotional well-being.  Home and relationship well-being.  Sexual activity.  Eating habits.  History of falls.  Memory and ability to understand (cognition).  Work and work Statistician. Screening  You may have the following tests or measurements:  Height, weight, and BMI.  Blood pressure.  Lipid and cholesterol levels. These may be checked every 5 years, or more frequently if you are over 69 years old.  Skin check.  Lung cancer screening. You may have this screening every year starting at age 41 if you have a 30-pack-year history of smoking and currently smoke or have quit within the past 15 years.  Fecal occult blood test (FOBT) of the stool. You may have this test every year starting at age 77.  Flexible sigmoidoscopy or colonoscopy. You may have a sigmoidoscopy every 5 years or a colonoscopy every 10 years starting at age 11.  Prostate cancer screening. Recommendations will vary depending on your family history and other risks.  Hepatitis C blood test.  Hepatitis B blood test.  Sexually transmitted disease (STD) testing.  Diabetes screening. This is done by checking your blood sugar (glucose) after you have not eaten for a while (fasting). You may have this done every 1-3 years.  Abdominal aortic aneurysm (AAA) screening. You may need this if you are a current or former smoker.  Osteoporosis. You may be screened starting at age 110 if you are at high risk. Talk with your health care provider about your test results, treatment options, and if necessary, the need for more tests. Vaccines  Your health care provider may recommend certain vaccines,  such as:  Influenza vaccine. This is recommended every year.  Tetanus, diphtheria, and acellular pertussis (Tdap, Td) vaccine. You may need a Td booster every 10 years.  Zoster vaccine. You may need this after age 33.  Pneumococcal 13-valent conjugate  (PCV13) vaccine. One dose is recommended after age 21.  Pneumococcal polysaccharide (PPSV23) vaccine. One dose is recommended after age 107. Talk to your health care provider about which screenings and vaccines you need and how often you need them. This information is not intended to replace advice given to you by your health care provider. Make sure you discuss any questions you have with your health care provider. Document Released: 06/13/2015 Document Revised: 02/04/2016 Document Reviewed: 03/18/2015 Elsevier Interactive Patient Education  2017 [place] Prevention in the Home Falls can cause injuries. They can happen to people of all ages. There are many things you can do to make your home safe and to help prevent falls. What can I do on the outside of my home?  Regularly fix the edges of walkways and driveways and fix any cracks.  Remove anything that might make you trip as you walk through a door, such as a raised step or threshold.  Trim any bushes or trees on the path to your home.  Use bright outdoor lighting.  Clear any walking paths of anything that might make someone trip, such as rocks or tools.  Regularly check to see if handrails are loose or broken. Make sure that both sides of any steps have handrails.  Any raised decks and porches should have guardrails on the edges.  Have any leaves, snow, or ice cleared regularly.  Use sand or salt on walking paths during winter.  Clean up any spills in your garage right away. This includes oil or grease spills. What can I do in the bathroom?  Use night lights.  Install grab bars by the toilet and in the tub and shower. Do not use towel bars as grab bars.  Use non-skid mats or decals in the tub or shower.  If you need to sit down in the shower, use a plastic, non-slip stool.  Keep the floor dry. Clean up any water that spills on the floor as soon as it happens.  Remove soap buildup in the tub or shower  regularly.  Attach bath mats securely with double-sided non-slip rug tape.  Do not have throw rugs and other things on the floor that can make you trip. What can I do in the bedroom?  Use night lights.  Make sure that you have a light by your bed that is easy to reach.  Do not use any sheets or blankets that are too big for your bed. They should not hang down onto the floor.  Have a firm chair that has side arms. You can use this for support while you get dressed.  Do not have throw rugs and other things on the floor that can make you trip. What can I do in the kitchen?  Clean up any spills right away.  Avoid walking on wet floors.  Keep items that you use a lot in easy-to-reach places.  If you need to reach something above you, use a strong step stool that has a grab bar.  Keep electrical cords out of the way.  Do not use floor polish or wax that makes floors slippery. If you must use wax, use non-skid floor wax.  Do not have throw rugs and other things on  the floor that can make you trip. What can I do with my stairs?  Do not leave any items on the stairs.  Make sure that there are handrails on both sides of the stairs and use them. Fix handrails that are broken or loose. Make sure that handrails are as long as the stairways.  Check any carpeting to make sure that it is firmly attached to the stairs. Fix any carpet that is loose or worn.  Avoid having throw rugs at the top or bottom of the stairs. If you do have throw rugs, attach them to the floor with carpet tape.  Make sure that you have a light switch at the top of the stairs and the bottom of the stairs. If you do not have them, ask someone to add them for you. What else can I do to help prevent falls?  Wear shoes that:  Do not have high heels.  Have rubber bottoms.  Are comfortable and fit you well.  Are closed at the toe. Do not wear sandals.  If you use a stepladder:  Make sure that it is fully  opened. Do not climb a closed stepladder.  Make sure that both sides of the stepladder are locked into place.  Ask someone to hold it for you, if possible.  Clearly mark and make sure that you can see:  Any grab bars or handrails.  First and last steps.  Where the edge of each step is.  Use tools that help you move around (mobility aids) if they are needed. These include:  Canes.  Walkers.  Scooters.  Crutches.  Turn on the lights when you go into a dark area. Replace any light bulbs as soon as they burn out.  Set up your furniture so you have a clear path. Avoid moving your furniture around.  If any of your floors are uneven, fix them.  If there are any pets around you, be aware of where they are.  Review your medicines with your doctor. Some medicines can make you feel dizzy. This can increase your chance of falling. Ask your doctor what other things that you can do to help prevent falls. This information is not intended to replace advice given to you by your health care provider. Make sure you discuss any questions you have with your health care provider. Document Released: 03/13/2009 Document Revised: 10/23/2015 Document Reviewed: 06/21/2014 Elsevier Interactive Patient Education  2017 [employer].

## 2020-10-04 ENCOUNTER — Ambulatory Visit
Admission: RE | Admit: 2020-10-04 | Discharge: 2020-10-04 | Disposition: A | Discharge status: Home / Self Care | Payer: PPO | Source: Ambulatory Visit | Attending: Neurology | Admitting: Neurology

## 2020-10-04 ENCOUNTER — Other Ambulatory Visit: Payer: Self-pay

## 2020-10-04 DIAGNOSIS — G9608 Other cranial cerebrospinal fluid leak: Secondary | ICD-10-CM | POA: Diagnosis not present

## 2020-10-04 DIAGNOSIS — I671 Cerebral aneurysm, nonruptured: Secondary | ICD-10-CM | POA: Diagnosis not present

## 2020-10-04 DIAGNOSIS — R519 Headache, unspecified: Secondary | ICD-10-CM | POA: Diagnosis not present

## 2020-10-04 MED ORDER — GADOBENATE DIMEGLUMINE 529 MG/ML IV SOLN
20.0000 mL | Freq: Once | INTRAVENOUS | Status: AC | PRN
  Administered 2020-10-04: 20 mL via INTRAVENOUS

## 2020-10-09 ENCOUNTER — Telehealth: Payer: Self-pay | Admitting: Neurology

## 2020-10-09 DIAGNOSIS — G9608 Other cranial cerebrospinal fluid leak: Secondary | ICD-10-CM

## 2020-10-09 DIAGNOSIS — G4733 Obstructive sleep apnea (adult) (pediatric): Secondary | ICD-10-CM

## 2020-10-09 DIAGNOSIS — I671 Cerebral aneurysm, nonruptured: Secondary | ICD-10-CM

## 2020-10-09 DIAGNOSIS — R0683 Snoring: Secondary | ICD-10-CM

## 2020-10-09 DIAGNOSIS — Z8679 Personal history of other diseases of the circulatory system: Secondary | ICD-10-CM

## 2020-10-09 DIAGNOSIS — R519 Headache, unspecified: Secondary | ICD-10-CM

## 2020-10-09 NOTE — Telephone Encounter (Signed)
Neurosurgery referral sent via fax to [facility]. Phone: [phone]. Fax: [phone].

## 2020-10-09 NOTE — Addendum Note (Signed)
Addended by: Star Age on: 10/09/2020 11:52 AM   Modules accepted: Orders

## 2020-10-09 NOTE — Telephone Encounter (Signed)
Please call patient and advise him regarding his recent scans.  His MR angiogram which looks at the major blood vessels of the brain, showed the possibility of a small aneurysm; he was suspected to have an aneurysm back in 2016 as well but the current scan quality was not perfect, and a reliable diagnosis about the aneurysm cannot be made on the scan.  His brain MRI showed evidence of fluid-filled spaces outside of the brain, which they had been seen before secondary to his hemorrhage.  However, the fluid-filled space on the right side has increased in size, compared to 2016, whereas the fluid-filled space outside the brain has decreased on the left.   It is somewhat concerning that the fluid quantity has increased on the right, pushing slightly on the brain.  He had previously seen neurosurgery and I would like to make a referral back.   Overall, based on his MRI and MR angiogram, there are no acute findings, no evidence of a recent stroke, no abnormal contrast uptake.  These are chronic findings that are seen.  Nevertheless, I would recommend re-evaluation and possible treatment with neurosurgery.  If he is agreeable, please enter a referral to neurosurgery, he had seen Dr. Newman Pies before.  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 Please reevaluate and treat as appropriate. Recent MRI and MRA brain done.   Furthermore, I would like for him to consider coming in for sleep study to further investigate his recurrent headaches.  We had talked about this during the clinic visit.  Prior sleep study was in 2015 and I would like to reevaluate him and we could consider treatment for sleep apnea as he was previously diagnosed.  Let me know if he is agreeable to pursuing a sleep study.  We can then order it.

## 2020-10-09 NOTE — Telephone Encounter (Signed)
I called patient and let him know that the referral for Neurosurgery has been sent, however they are a week behind with referrals at this time. Patient was understanding & I advised to call back if he has any questions/concerns.

## 2020-10-09 NOTE — Telephone Encounter (Signed)
I called patient, and we reviewed the results of the MRA and the MRI.  Patient verbalized understanding of results and is agreeable to both the neurosurgery consult along with pursuing sleep study testing.  I have placed the referral for the neurosurgeon office along with the sleep study for the patient.  Patient was advised he will be receiving a call about both of these, and to call back if he has any questions/issues.

## 2020-10-09 NOTE — Telephone Encounter (Signed)
Noted, thank you

## 2020-10-09 NOTE — Addendum Note (Signed)
Addended by: Verlin Grills on: 10/09/2020 11:43 AM   Modules accepted: Orders

## 2020-10-29 ENCOUNTER — Encounter: Payer: Self-pay | Admitting: Neurology

## 2020-11-05 ENCOUNTER — Emergency Department (HOSPITAL_COMMUNITY): Payer: PPO

## 2020-11-05 ENCOUNTER — Encounter (HOSPITAL_COMMUNITY): Payer: Self-pay

## 2020-11-05 ENCOUNTER — Other Ambulatory Visit: Payer: Self-pay

## 2020-11-05 ENCOUNTER — Emergency Department (HOSPITAL_COMMUNITY)
Admission: EM | Admit: 2020-11-05 | Discharge: 2020-11-05 | Disposition: A | Discharge status: Home / Self Care | Payer: PPO | Attending: Emergency Medicine | Admitting: Emergency Medicine

## 2020-11-05 ENCOUNTER — Telehealth: Payer: Self-pay

## 2020-11-05 DIAGNOSIS — J449 Chronic obstructive pulmonary disease, unspecified: Secondary | ICD-10-CM | POA: Insufficient documentation

## 2020-11-05 DIAGNOSIS — J189 Pneumonia, unspecified organism: Secondary | ICD-10-CM | POA: Diagnosis not present

## 2020-11-05 DIAGNOSIS — I2699 Other pulmonary embolism without acute cor pulmonale: Secondary | ICD-10-CM | POA: Diagnosis not present

## 2020-11-05 DIAGNOSIS — R0682 Tachypnea, not elsewhere classified: Secondary | ICD-10-CM | POA: Diagnosis not present

## 2020-11-05 DIAGNOSIS — R0789 Other chest pain: Secondary | ICD-10-CM | POA: Diagnosis not present

## 2020-11-05 DIAGNOSIS — R059 Cough, unspecified: Secondary | ICD-10-CM

## 2020-11-05 DIAGNOSIS — R079 Chest pain, unspecified: Secondary | ICD-10-CM

## 2020-11-05 DIAGNOSIS — Z20822 Contact with and (suspected) exposure to covid-19: Secondary | ICD-10-CM | POA: Diagnosis not present

## 2020-11-05 DIAGNOSIS — I10 Essential (primary) hypertension: Secondary | ICD-10-CM | POA: Diagnosis not present

## 2020-11-05 DIAGNOSIS — Z87891 Personal history of nicotine dependence: Secondary | ICD-10-CM | POA: Diagnosis not present

## 2020-11-05 DIAGNOSIS — Z8616 Personal history of COVID-19: Secondary | ICD-10-CM | POA: Insufficient documentation

## 2020-11-05 DIAGNOSIS — I517 Cardiomegaly: Secondary | ICD-10-CM | POA: Diagnosis not present

## 2020-11-05 LAB — COMPREHENSIVE METABOLIC PANEL
ALT: 16 U/L (ref 0–44)
AST: 19 U/L (ref 15–41)
Albumin: 4 g/dL (ref 3.5–5.0)
Alkaline Phosphatase: 39 U/L (ref 38–126)
Anion gap: 9 (ref 5–15)
BUN: 15 mg/dL (ref 8–23)
CO2: 29 mmol/L (ref 22–32)
Calcium: 9.3 mg/dL (ref 8.9–10.3)
Chloride: 99 mmol/L (ref 98–111)
Creatinine, Ser: 1.12 mg/dL (ref 0.61–1.24)
GFR, Estimated: >60 mL/min (ref >60)
Glucose, Bld: 156 mg/dL — ABNORMAL HIGH (ref 70–99)
Potassium: 3.4 mmol/L — ABNORMAL LOW (ref 3.5–5.1)
Sodium: 137 mmol/L (ref 135–145)
Total Bilirubin: 1.1 mg/dL (ref 0.3–1.2)
Total Protein: 7.3 g/dL (ref 6.5–8.1)

## 2020-11-05 LAB — TROPONIN I (HIGH SENSITIVITY)
Troponin I (High Sensitivity): 5 ng/L (ref <18)
Troponin I (High Sensitivity): 5 ng/L (ref <18)

## 2020-11-05 LAB — CBC
HCT: 36.1 % — ABNORMAL LOW (ref 39.0–52.0)
Hemoglobin: 11.7 g/dL — ABNORMAL LOW (ref 13.0–17.0)
MCH: 28.5 pg (ref 26.0–34.0)
MCHC: 32.4 g/dL (ref 30.0–36.0)
MCV: 87.8 fL (ref 80.0–100.0)
Platelets: 160 10*3/uL (ref 150–400)
RBC: 4.11 MIL/uL — ABNORMAL LOW (ref 4.22–5.81)
RDW: 15.9 % — ABNORMAL HIGH (ref 11.5–15.5)
WBC: 10.5 10*3/uL (ref 4.0–10.5)
nRBC: 0 % (ref 0.0–0.2)

## 2020-11-05 LAB — RESP PANEL BY RT-PCR (FLU A&B, COVID) ARPGX2
Influenza A by PCR: NEGATIVE
Influenza B by PCR: NEGATIVE
SARS Coronavirus 2 by RT PCR: NEGATIVE

## 2020-11-05 MED ORDER — BENZONATATE 100 MG PO CAPS: 100.0000 mg | ORAL_CAPSULE | Freq: Three times a day (TID) | ORAL | 0 refills | Status: DC

## 2020-11-05 MED ORDER — IOHEXOL 350 MG/ML SOLN
100.0000 mL | Freq: Once | INTRAVENOUS | Status: AC | PRN
  Administered 2020-11-05: 100 mL via INTRAVENOUS

## 2020-11-05 MED ORDER — AMOXICILLIN-POT CLAVULANATE 875-125 MG PO TABS
1.0000 | ORAL_TABLET | Freq: Once | ORAL | Status: AC
  Administered 2020-11-05: 1 via ORAL
  Filled 2020-11-05: qty 1

## 2020-11-05 MED ORDER — AMOXICILLIN-POT CLAVULANATE 875-125 MG PO TABS: 1.0000 | ORAL_TABLET | Freq: Two times a day (BID) | ORAL | 0 refills | Status: DC

## 2020-11-05 NOTE — ED Triage Notes (Signed)
Pt arrived via POV c/o cough, congestion, et fever that began yesterday. States that chest hurts with each breath.

## 2020-11-05 NOTE — Telephone Encounter (Signed)
error 

## 2020-11-05 NOTE — ED Provider Notes (Signed)
CT shows likely PNA - pt informed Augmentin started Tessalon for home Pt agreeable No hypoxia   Noemi Chapel, MD 11/05/20 [phone]

## 2020-11-05 NOTE — ED Notes (Signed)
Patient given urinal at this time. 

## 2020-11-05 NOTE — ED Notes (Signed)
Patient transported to CT 

## 2020-11-05 NOTE — ED Provider Notes (Signed)
[facility] Emergency Department Provider Note MRN:  [id number]  Arrival date & time: 11/05/20     Chief Complaint   Cough   History of Present Illness   [name] is a 80 y.o. year-old male with a history of AAA PAD presenting to the ED with chief complaint of cough.  Cough for the past 2 days, associated with some chest soreness.  Denies shortness of breath.  Also endorsing fever and nasal congestion for the past few days.  No abdominal pain, no numbness or weakness to the arms or legs, no leg pain or swelling.  Review of Systems  A complete 10 system review of systems was obtained and all systems are negative except as noted in the HPI and PMH.   Patient's Health History    Past Medical History:  Diagnosis Date  . AAA (abdominal aortic aneurysm) ([place])   . AKI (acute kidney injury) ([place]) 04/04/2016  . Aneurysm ([place])   . 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 Chest pain 07/17/2011   Not clearly of cardiac origin.  Will rx empirically with a PPI and assess response.  Admitted to [facility] in 06/2011; echo-moderate LVH, normal EF, mild aortic root dilatation; stress nuclear-normal perfusion, normal EF.   . Colon polyps   . COPD (chronic obstructive pulmonary disease) ([place])   . COVID-19   . Degenerative joint disease    had disc fusion in the past  . Hearing deficit, bilateral    left worse than right  . History of blood transfusion 12/2007   "bleeding for Warfarin"  . Hyperlipidemia   . Hypertension    Admitted for chest pain and 06/2011-neg stress Myoview,mod. LVH with nl EF on echo; TSH of 3.75  . Hyponatremia 04/04/2016  . Meniere's disease   . Nephrolithiasis   . 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 Permanent atrial fibrillation ([place])    a. s/p Watchman  . Pneumonia   . Renal hemorrhage, right 2009   2009 while anticoagulated with Coumadin; treated with the renal artery embolization  . Renal  hemorrhage, right    2009 while anticoagulated with Coumadin; treated with the renal artery embolization   . Sepsis ([place]) 04/04/2016    Past Surgical History:  Procedure Laterality Date  . ABDOMINAL AORTIC ENDOVASCULAR FENESTRATED STENT GRAFT N/A 05/08/2020   Procedure: ABDOMINAL AORTIC ENDOVASCULAR FENESTRATED STENT GRAFT;  Surgeon: [name], MD;  Location: [place];  Service: Vascular;  Laterality: N/A;  . BIOPSY  02/21/2017   Procedure: BIOPSY;  Surgeon: Danie Binder, MD;  [facility];  Service: Endoscopy;;  gastric  . COLONOSCOPY N/A 02/21/2017   multiple polyps, one requiring piecemeal removal with epi and clip placement, multiple small and large mouthed diverticula, recto-sigmoid and sigmoid with significant excess looping, internal hemorrhoids. Path with one advanced adenoma, 3 simple adenomas, 2 hyperplastic polyps. Survelilance 3 years   . EMBOLIZATION  12/2007   Left renal hematoma, status post angiogram and embolization/notes 09/29/2010  . ESOPHAGOGASTRODUODENOSCOPY N/A 02/21/2017   benign-appearing esophageal stenosis s/p dilation, gastritis, scarred mucosa in pylorus due to prior PUD. reactive gastropathy on path  . FRACTURE SURGERY    . LEFT ATRIAL APPENDAGE OCCLUSION N/A 03/18/2016   Procedure: LEFT ATRIAL APPENDAGE OCCLUSION;  Surgeon: [name], MD;  Location: [place] [facility];  Service: Cardiovascular;  Laterality: N/A;  . ORIF FEMUR FRACTURE Right 1960   "2 steel  rods in leg"  . PATCH ANGIOPLASTY Left 05/08/2020   Procedure: PATCH ANGIOPLASTY LEFT FEMORAL ARTERY;  Surgeon: [name], MD;  Location: [place];  Service: Vascular;  Laterality: Left;  . PILONIDAL CYST EXCISION    . POLYPECTOMY  02/21/2017   Procedure: POLYPECTOMY;  Surgeon: Danie Binder, MD;  [facility];  Service: Endoscopy;;  colon  . REPAIR ILIAC ARTERY Left 05/08/2020   Procedure: EXPLORATION AND REPAIR LEFT FEMORAL ARTERY;  Surgeon: [name], MD;   Location: [place];  Service: Vascular;  Laterality: Left;  . SAVORY DILATION N/A 02/21/2017   Procedure: SAVORY DILATION;  Surgeon: Danie Binder, MD;  [facility];  Service: Endoscopy;  Laterality: N/A;  . TEE WITHOUT CARDIOVERSION N/A 03/10/2016   Procedure: TRANSESOPHAGEAL ECHOCARDIOGRAM (TEE);  Surgeon: Josue Hector, MD;  Location: [place];  Service: Cardiovascular;  Laterality: N/A;  . TEE WITHOUT CARDIOVERSION N/A 04/30/2016   Procedure: TRANSESOPHAGEAL ECHOCARDIOGRAM (TEE);  Surgeon: Josue Hector, MD;  Location: [facility];  Service: Cardiovascular;  Laterality: N/A;  . ULTRASOUND GUIDANCE FOR VASCULAR ACCESS Bilateral 05/08/2020   Procedure: ULTRASOUND GUIDANCE FOR VASCULAR ACCESS, bilateral femoral arteries;  Surgeon: [name], MD;  Location: [place];  Service: Vascular;  Laterality: Bilateral;  . Watchman procedure  02/2016    Family History  Problem Relation Age of Onset  . Hypertension Mother   . Hyperlipidemia Mother   . Renal cancer Mother   . Atrial fibrillation Sister   . Stroke Father   . Dementia Father   . Colon cancer Cousin   . Heart disease Brother   . Colon polyps Neg Hx     Social History   Socioeconomic History  . Marital status: Married    Spouse name: Not on file  . Number of children: 2  . Years of education: Not on file  . Highest education level: Not on file  Occupational History  . Occupation: Retired    Comment: Chief Technology Officer job  Tobacco Use  . Smoking status: Former Smoker    Packs/day: 1.00    Years: 40.00    Pack years: 40.00    Quit date: 01/20/2008    Years since quitting: 12.8  . Smokeless tobacco: Never Used  Vaping Use  . Vaping Use: Never used  Substance and Sexual Activity  . Alcohol use: Not Currently    Alcohol/week: 1.0 standard drink    Types: 1 Standard drinks or equivalent per week    Comment: rare wine  . Drug use: No  . Sexual activity: Yes  Other Topics Concern  . Not on  file  Social History Narrative   Retired from Architect   Lives in [place] Strain: [place]   . Difficulty of Paying Living Expenses: Not hard at all  Food Insecurity: No Food Insecurity  . Worried About Charity fundraiser in the Last Year: Never true  . Ran Out of Food in the Last Year: Never true  Transportation Needs: No Transportation Needs  . Lack of Transportation (Medical): No  . Lack of Transportation (Non-Medical): No  Physical Activity: Insufficiently Active  . Days of Exercise per Week: 2 days  . Minutes of Exercise per Session: 40 min  Stress: No Stress Concern Present  . Feeling of Stress : Not at all  Social Connections: Socially Integrated  . Frequency of Communication with Friends and Family: More than three  times a week  . Frequency of Social Gatherings with Friends and Family: Twice a week  . Attends Religious Services: More than 4 times per year  . Active Member of Clubs or Organizations: Yes  . Attends Archivist Meetings: More than 4 times per year  . Marital Status: Married  Human resources officer Violence: Not At Risk  . Fear of Current or Ex-Partner: No  . Emotionally Abused: No  . Physically Abused: No  . Sexually Abused: No     Physical Exam   Vitals:   11/05/20 0600 11/05/20 0630  BP: (!) 145/83 (!) 142/81  Pulse: 92 86  Resp: (!) 30 (!) 31  Temp:    SpO2: 95% 96%    CONSTITUTIONAL: Chronically ill-appearing, NAD NEURO:  Alert and oriented x 3, no focal deficits EYES:  eyes equal and reactive ENT/NECK:  no LAD, no JVD CARDIO: Regular rate, well-perfused, normal S1 and S2 PULM:  CTAB no wheezing or rhonchi, tachypneic GI/GU:  normal bowel sounds, non-distended, non-tender MSK/SPINE:  No gross deformities, no edema SKIN:  no rash, atraumatic PSYCH:  Appropriate speech and behavior  *Additional and/or pertinent findings included in MDM below  Diagnostic and Interventional  Summary    EKG Interpretation  Date/Time:  Wednesday November 05 2020 05:24:07 EDT Ventricular Rate:  97 PR Interval:    QRS Duration: 95 QT Interval:  303 QTC Calculation: 385 R Axis:   63 Text Interpretation: Atrial fibrillation Nonspecific repol abnormality, diffuse leads Confirmed by Gerlene Fee [phone]) on 11/05/2020 5:43:53 AM      Labs Reviewed  CBC - Abnormal; Notable for the following components:      Result Value   RBC 4.11 (*)    Hemoglobin 11.7 (*)    HCT 36.1 (*)    RDW 15.9 (*)    All other components within normal limits  RESP PANEL BY RT-PCR (FLU A&B, COVID) ARPGX2  COMPREHENSIVE METABOLIC PANEL  TROPONIN I (HIGH SENSITIVITY)    DG Chest Port 1 View  Final Result    CT Angio Chest Pulmonary Embolism (PE) W or WO Contrast    (Results Pending)    Medications - No data to display   Procedures  /  Critical Care Procedures  ED Course and Medical Decision Making  I have reviewed the triage vital signs, the nursing notes, and pertinent available records from the EMR.  Listed above are laboratory and imaging tests that I personally ordered, reviewed, and interpreted and then considered in my medical decision making (see below for details).  Favoring infectious process, considering pneumonia versus viral illness versus COVID-19.  Given the chest pain and his chronic comorbidities, will need to evaluate for ACS with EKG and troponin.     Chest x-ray does not reveal any obvious pathology.  Patient remains a bit tachypneic, having pain with breathing in.  Will obtain CTA to evaluate for occult pneumonia versus PE.  If work-up is reassuring and patient is able to ambulate without hypoxia, would be a candidate for discharge.  Signed out to oncoming provider at shift change  Barth Kirks. [place] Small, [place] [email or web address]  Final Clinical Impressions(s) / ED Diagnoses     ICD-10-CM   1. Cough  R05.9   2. Chest pain,  unspecified type  R07.9   3. Tachypnea  R06.82     ED Discharge Orders    None       Discharge Instructions Discussed with and Provided to  Patient:   Discharge Instructions   None       Maudie Flakes, MD 11/05/20 [phone]

## 2020-11-05 NOTE — ED Notes (Signed)
Report received from [place], [place]. Patient resting in bed with eyes closed. Patient has equal chest rise and fall with periods of tachpnea. Patient currently awaiting CT scan for further testing.

## 2020-11-05 NOTE — Discharge Instructions (Signed)
Your CT scan shows a small pneumonia - this needs to be follow up in 3-4 weeks with repeat xray - please have your doctor obtain your records and order appropriate testing.  Augmentin twice daily for 7 days  Please take Tessalon 100mg  by mouth 3 times daily for cough as needed.   ER for worsening symptoms.

## 2020-11-11 DIAGNOSIS — I671 Cerebral aneurysm, nonruptured: Secondary | ICD-10-CM | POA: Insufficient documentation

## 2020-11-11 DIAGNOSIS — Z6830 Body mass index (BMI) 30.0-30.9, adult: Secondary | ICD-10-CM | POA: Insufficient documentation

## 2020-11-11 DIAGNOSIS — S065X9A Traumatic subdural hemorrhage with loss of consciousness of unspecified duration, initial encounter: Secondary | ICD-10-CM | POA: Diagnosis not present

## 2020-11-11 DIAGNOSIS — I10 Essential (primary) hypertension: Secondary | ICD-10-CM | POA: Diagnosis not present

## 2020-11-13 ENCOUNTER — Encounter: Payer: Self-pay | Admitting: Nurse Practitioner

## 2020-11-13 ENCOUNTER — Telehealth (INDEPENDENT_AMBULATORY_CARE_PROVIDER_SITE_OTHER): Payer: PPO | Admitting: Nurse Practitioner

## 2020-11-13 ENCOUNTER — Other Ambulatory Visit: Payer: Self-pay

## 2020-11-13 DIAGNOSIS — J189 Pneumonia, unspecified organism: Secondary | ICD-10-CM

## 2020-11-13 NOTE — Assessment & Plan Note (Signed)
-  resolved -chest soreness and cough have resolved; he is feeling much better today

## 2020-11-13 NOTE — Progress Notes (Signed)
Acute Office Visit  Subjective:    Patient ID: [name], male    DOB: [birth year], 80 y.o.   MRN: [id number]  Chief Complaint  Patient presents with   Pneumonia    ER follow up     Pneumonia  Patient is in today for ED follow-up. He went to the ED on 11/05/20 for cough. He finished a course of augmentin and has some tessalon perles left. He states he feels much better. He states the cough and chest soreness is gone.  At that time, ED note states, "Cough for the past 2 days, associated with some chest soreness.  Denies shortness of breath.  Also endorsing fever and nasal congestion for the past few days.  No abdominal pain, no numbness or weakness to the arms or legs, no leg pain or swelling....  Favoring infectious process, considering pneumonia versus viral illness versus COVID-19.  Given the chest pain and his chronic comorbidities, will need to evaluate for ACS with EKG and troponin.    Chest x-ray does not reveal any obvious pathology.  Patient remains a bit tachypneic, having pain with breathing in.  Will obtain CTA to evaluate for occult pneumonia versus PE.  If work-up is reassuring and patient is able to ambulate without hypoxia, would be a candidate for discharge.  Signed out to oncoming provider at shift change"  From 11/05/20 CT angio of his chest, "IMPRESSION: 1. Right upper lobe opacity most consistent with pneumonia. The opacity will be most apparent on the lateral view which is recommended today for follow-up purposes. Followup PA and lateral chest X-ray is recommended in 3-4 weeks following trial of antibiotic therapy to ensure resolution. 2. Negative for pulmonary embolism. 3. Fluid distended esophagus. 4.  Aortic Atherosclerosis (ICD10-I70.0)."  Past Medical History:  Diagnosis Date   AAA (abdominal aortic aneurysm) ([place])    AKI (acute kidney injury) ([place]) 04/04/2016   Aneurysm ([place])    Arthritis    to lower back   Chest pain 07/17/2011   Not clearly of  cardiac origin.  Will rx empirically with a PPI and assess response.  Admitted to [facility] in 06/2011; echo-moderate LVH, normal EF, mild aortic root dilatation; stress nuclear-normal perfusion, normal EF.    Colon polyps    COPD (chronic obstructive pulmonary disease) ([place])    COVID-19    Degenerative joint disease    had disc fusion in the past   Hearing deficit, bilateral    left worse than right   History of blood transfusion 12/2007   "bleeding for Warfarin"   Hyperlipidemia    Hypertension    Admitted for chest pain and 06/2011-neg stress Myoview,mod. 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 s/p Watchman   Pneumonia    Renal hemorrhage, right 2009   2009 while anticoagulated with Coumadin; treated with the renal artery embolization   Renal hemorrhage, right    2009 while anticoagulated with Coumadin; treated with the renal artery embolization    Sepsis ([place] 04/04/2016    Past Surgical History:  Procedure Laterality Date   ABDOMINAL AORTIC ENDOVASCULAR FENESTRATED STENT GRAFT N/A 05/08/2020   Procedure: ABDOMINAL AORTIC ENDOVASCULAR FENESTRATED STENT GRAFT;  Surgeon: CMarty Heck MD;  [facility]  Service: Vascular;  Laterality:  N/A;   BIOPSY  02/21/2017   Procedure: BIOPSY;  Surgeon: Danie Binder, MD;  [facility];  Service: Endoscopy;;  gastric   COLONOSCOPY N/A 02/21/2017   multiple polyps, one requiring piecemeal removal with epi and clip placement, multiple small and large mouthed diverticula, recto-sigmoid and sigmoid with significant excess looping, internal hemorrhoids. Path with one advanced adenoma, 3 simple adenomas, 2 hyperplastic polyps. Survelilance 3 years    EMBOLIZATION  12/2007   Left renal hematoma, status post angiogram and embolization/notes  09/29/2010   ESOPHAGOGASTRODUODENOSCOPY N/A 02/21/2017   benign-appearing esophageal stenosis s/p dilation, gastritis, scarred mucosa in pylorus due to prior PUD. reactive gastropathy on path   FRACTURE SURGERY     LEFT ATRIAL APPENDAGE OCCLUSION N/A 03/18/2016   Procedure: LEFT ATRIAL APPENDAGE OCCLUSION;  Surgeon: [name], MD;  Location: [facility];  Service: Cardiovascular;  Laterality: N/A;   ORIF FEMUR FRACTURE Right 1960   "2 steel rods in leg"   PATCH ANGIOPLASTY Left 05/08/2020   Procedure: PATCH ANGIOPLASTY LEFT FEMORAL ARTERY;  Surgeon: [name], MD;  Location: [facility];  Service: Vascular;  Laterality: Left;   PILONIDAL CYST EXCISION     POLYPECTOMY  02/21/2017   Procedure: POLYPECTOMY;  Surgeon: Danie Binder, MD;  [facility];  Service: Endoscopy;;  colon   REPAIR ILIAC ARTERY Left 05/08/2020   Procedure: EXPLORATION AND REPAIR LEFT FEMORAL ARTERY;  Surgeon: [name], MD;  Location: [facility];  Service: Vascular;  Laterality: Left;   SAVORY DILATION N/A 02/21/2017   Procedure: SAVORY DILATION;  Surgeon: Danie Binder, MD;  [facility];  Service: Endoscopy;  Laterality: N/A;   TEE WITHOUT CARDIOVERSION N/A 03/10/2016   Procedure: TRANSESOPHAGEAL ECHOCARDIOGRAM (TEE);  Surgeon: Josue Hector, MD;  Location[facility];  Service: Cardiovascular;  Laterality: N/A;   TEE WITHOUT CARDIOVERSION N/A 04/30/2016   Procedure: TRANSESOPHAGEAL ECHOCARDIOGRAM (TEE);  Surgeon: Josue Hector, MD;  Location: [facility] ENDOSCOPY;  Service: Cardiovascular;  Laterality: N/A;   ULTRASOUND GUIDANCE FOR VASCULAR ACCESS Bilateral 05/08/2020   Procedure: ULTRASOUND GUIDANCE FOR VASCULAR ACCESS, bilateral femoral arteries;  Surgeon: [name], MD;  Location: [facility] OR;  Service: Vascular;  Laterality: Bilateral;   Watchman procedure  02/2016    Family History  Problem Relation Age of Onset   Hypertension Mother    Hyperlipidemia Mother    Renal  cancer Mother    Atrial fibrillation Sister    Stroke Father    Dementia Father    Colon cancer Cousin    Heart disease Brother    Colon polyps Neg Hx     Social History   Socioeconomic History   Marital status: Married    Spouse name: Not on file   Number of children: 2   Years of education: Not on file   Highest education level: Not on file  Occupational History   Occupation: Retired    Comment: Chief Technology Officer job  Tobacco Use   Smoking status: Former    Packs/day: 1.00    Years: 40.00    Pack years: 40.00    Types: Cigarettes    Quit date: 01/20/2008    Years since quitting: 12.8   Smokeless tobacco: Never  Vaping Use   Vaping Use: Never used  Substance and Sexual Activity   Alcohol use: Not Currently    Alcohol/week: 1.0 standard drink    Types: 1 Standard drinks or equivalent per week    Comment:  rare wine   Drug use: No   Sexual activity: Yes  Other Topics Concern   Not on file  Social History Narrative   Retired from Architect   Lives in [place] Strain: Low Risk    Difficulty of Paying Living Expenses: Not hard at all  Food Insecurity: No Food Insecurity   Worried About Charity fundraiser in the Last Year: Never true   Arboriculturist in the Last Year: Never true  Transportation Needs: No Transportation Needs   Lack of Transportation (Medical): No   Lack of Transportation (Non-Medical): No  Physical Activity: Insufficiently Active   Days of Exercise per Week: 2 days   Minutes of Exercise per Session: 40 min  Stress: No Stress Concern Present   Feeling of Stress : Not at all  Social Connections: Socially Integrated   Frequency of Communication with Friends and Family: More than three times a week   Frequency of Social Gatherings with Friends and Family: Twice a week   Attends Religious Services: More than 4 times per year   Active Member of [employer] or Organizations: Yes    Attends Music therapist: More than 4 times per year   Marital Status: Married  Human resources officer Violence: Not At Risk   Fear of Current or Ex-Partner: No   Emotionally Abused: No   Physically Abused: No   Sexually Abused: No    Outpatient Medications Prior to Visit  Medication Sig Dispense Refill   acetaminophen (TYLENOL) 500 MG tablet Take 1,000 mg by mouth every 8 (eight) hours as needed (pain).     albuterol (VENTOLIN HFA) 108 (90 Base) MCG/ACT inhaler Inhale 1-2 puffs into the lungs every 6 (six) hours as needed for wheezing or shortness of breath. 18 g 1   ALPRAZolam (XANAX) 0.5 MG tablet Take 1-2 pills as needed on call to MRI.  May take a 3rd pill if needed. (Patient taking differently: Take 1-2 pills as needed on call to MRI.  May take a 3rd pill if needed.) 3 tablet 0   ascorbic acid (VITAMIN C) 500 MG tablet Take 2 tablets by mouth as needed.     aspirin EC 81 MG tablet Take 81 mg by mouth daily. Swallow whole.     blood glucose meter kit and supplies Dispense based on patient and insurance preference. Use to monitor blood sugar daily. E11.65 1 each 0   cetirizine (ZYRTEC) 10 MG tablet Take 10 mg by mouth at bedtime.      cholecalciferol (VITAMIN D3) 25 MCG (1000 UNIT) tablet Take 1,000 Units by mouth daily.     clopidogrel (PLAVIX) 75 MG tablet Take 1 tablet (75 mg total) by mouth daily at 6 (six) AM. 90 tablet 3   diltiazem (CARDIZEM CD) 240 MG 24 hr capsule Take 1 capsule (240 mg total) by mouth at bedtime. 90 capsule 1   fluticasone (FLONASE) 50 MCG/ACT nasal spray Place 1 spray into both nostrils every morning.      LORazepam (ATIVAN) 0.5 MG tablet Take 1 tablet (0.5 mg total) by mouth at bedtime. 90 tablet 1   meclizine (ANTIVERT) 25 MG tablet Take 25 mg by mouth 2 (two) times daily. For dizziness May take an additional dose at [facility] if needed     metFORMIN (GLUCOPHAGE) 500 MG tablet Take 1 tablet (500 mg total) by mouth daily with breakfast. 90 tablet 1    metoprolol succinate (TOPROL-XL)  25 MG 24 hr tablet Take 0.5 tablets (12.5 mg total) by mouth daily. 90 tablet 1   mirabegron ER (MYRBETRIQ) 50 MG TB24 tablet Take 1 tablet by mouth at bedtime.     Multiple Vitamin (MULTIVITAMIN) tablet Take 1 tablet by mouth daily.     pantoprazole (PROTONIX) 40 MG tablet Take 1 tablet (40 mg total) by mouth 2 (two) times daily. 180 tablet 3   rosuvastatin (CRESTOR) 5 MG tablet Take 1 tablet (5 mg total) by mouth every evening. 90 tablet 1   tamsulosin (FLOMAX) 0.4 MG CAPS capsule Take 0.4 mg by mouth in the morning and at bedtime.      triamterene-hydrochlorothiazide (MAXZIDE-25) 37.5-25 MG tablet Take 1 tablet by mouth daily. 90 tablet 1   amoxicillin-clavulanate (AUGMENTIN) 875-125 MG tablet Take 1 tablet by mouth every 12 (twelve) hours. 14 tablet 0   benzonatate (TESSALON) 100 MG capsule Take 1 capsule (100 mg total) by mouth every 8 (eight) hours. 21 capsule 0   No facility-administered medications prior to visit.    Allergies  Allergen Reactions   Other Swelling   Statins Other (See Comments)    Muscle aches Muscle aches   Rivaroxaban Other (See Comments)    Developed redness in both legs Developed redness in both legs    Review of Systems  Constitutional: Negative.   Respiratory: Negative.    Cardiovascular: Negative.   Psychiatric/Behavioral: Negative.        Objective:    Physical Exam  There were no vitals taken for this visit. Wt Readings from Last 3 Encounters:  11/05/20 206 lb (93.4 kg)  08/28/20 211 lb 5 oz (95.9 kg)  08/21/20 211 lb (95.7 kg)    Health Maintenance Due  Topic Date Due   FOOT EXAM  Never done   OPHTHALMOLOGY EXAM  Never done   URINE MICROALBUMIN  Never done   Zoster Vaccines- Shingrix (1 of 2) Never done   COLONOSCOPY (Pts 45-49yr Insurance coverage will need to be confirmed)  02/22/2020   COVID-19 Vaccine (3 - Booster for Moderna series) 02/25/2020    There are no preventive care reminders to  display for this patient.   Lab Results  Component Value Date   TSH 3.758 06/19/2012   Lab Results  Component Value Date   WBC 10.5 11/05/2020   HGB 11.7 (L) 11/05/2020   HCT 36.1 (L) 11/05/2020   MCV 87.8 11/05/2020   PLT 160 11/05/2020   Lab Results  Component Value Date   NA 137 11/05/2020   K 3.4 (L) 11/05/2020   CO2 29 11/05/2020   GLUCOSE 156 (H) 11/05/2020   BUN 15 11/05/2020   CREATININE 1.12 11/05/2020   BILITOT 1.1 11/05/2020   ALKPHOS 39 11/05/2020   AST 19 11/05/2020   ALT 16 11/05/2020   PROT 7.3 11/05/2020   ALBUMIN 4.0 11/05/2020   CALCIUM 9.3 11/05/2020   ANIONGAP 9 11/05/2020   EGFR 63 08/18/2020   Lab Results  Component Value Date   CHOL 141 08/18/2020   Lab Results  Component Value Date   HDL 60 08/18/2020   Lab Results  Component Value Date   LDLCALC 65 08/18/2020   Lab Results  Component Value Date   TRIG 82 08/18/2020   Lab Results  Component Value Date   CHOLHDL 4.4 12/08/2012   Lab Results  Component Value Date   HGBA1C 7.2 (H) 08/18/2020       Assessment & Plan:   Problem List Items Addressed This  Visit       Respiratory   CAP (community acquired pneumonia)    -resolved -chest soreness and cough have resolved; he is feeling much better today         No orders of the defined types were placed in this encounter.  Date:  11/13/2020   Location of Patient: Home Location of Provider: Office Consent was obtain for visit to be over via telehealth. I verified that I am speaking with the correct person using two identifiers.  I connected with  Joaquin Music on 11/13/20 via telephone and verified that I am speaking with the correct person using two identifiers.   I discussed the limitations of evaluation and management by telemedicine. The patient expressed understanding and agreed to proceed.  Time spent: 7 minutes   Noreene Larsson, NP

## 2020-11-13 NOTE — Patient Instructions (Signed)
He has lab follow-up already scheduled today.

## 2020-11-14 DIAGNOSIS — I10 Essential (primary) hypertension: Secondary | ICD-10-CM | POA: Insufficient documentation

## 2020-11-17 DIAGNOSIS — I10 Essential (primary) hypertension: Secondary | ICD-10-CM | POA: Diagnosis not present

## 2020-11-17 DIAGNOSIS — E785 Hyperlipidemia, unspecified: Secondary | ICD-10-CM | POA: Diagnosis not present

## 2020-11-17 DIAGNOSIS — E1165 Type 2 diabetes mellitus with hyperglycemia: Secondary | ICD-10-CM | POA: Diagnosis not present

## 2020-11-18 NOTE — Progress Notes (Signed)
Labs are stable, and A1c is great at 6.0.

## 2020-11-19 LAB — CBC WITH DIFFERENTIAL/PLATELET
Basophils Absolute: 0 10*3/uL (ref 0.0–0.2)
Basos: 0 %
EOS (ABSOLUTE): 0.1 10*3/uL (ref 0.0–0.4)
Eos: 2 %
Hematocrit: 35.1 % — ABNORMAL LOW (ref 37.5–51.0)
Hemoglobin: 11.6 g/dL — ABNORMAL LOW (ref 13.0–17.7)
Immature Grans (Abs): 0 10*3/uL (ref 0.0–0.1)
Immature Granulocytes: 0 %
Lymphocytes Absolute: 0.4 10*3/uL — ABNORMAL LOW (ref 0.7–3.1)
Lymphs: 7 %
MCH: 28.4 pg (ref 26.6–33.0)
MCHC: 33 g/dL (ref 31.5–35.7)
MCV: 86 fL (ref 79–97)
Monocytes Absolute: 0.4 10*3/uL (ref 0.1–0.9)
Monocytes: 8 %
Neutrophils Absolute: 4.5 10*3/uL (ref 1.4–7.0)
Neutrophils: 83 %
Platelets: 226 10*3/uL (ref 150–450)
RBC: 4.08 x10E6/uL — ABNORMAL LOW (ref 4.14–5.80)
RDW: 15.2 % (ref 11.6–15.4)
WBC: 5.4 10*3/uL (ref 3.4–10.8)

## 2020-11-19 LAB — CMP14+EGFR
ALT: 12 IU/L (ref 0–44)
AST: 15 IU/L (ref 0–40)
Albumin/Globulin Ratio: 1.8 (ref 1.2–2.2)
Albumin: 4.2 g/dL (ref 3.7–4.7)
Alkaline Phosphatase: 42 IU/L — ABNORMAL LOW (ref 44–121)
BUN/Creatinine Ratio: 12 (ref 10–24)
BUN: 13 mg/dL (ref 8–27)
Bilirubin Total: 0.6 mg/dL (ref 0.0–1.2)
CO2: 28 mmol/L (ref 20–29)
Calcium: 9.2 mg/dL (ref 8.6–10.2)
Chloride: 99 mmol/L (ref 96–106)
Creatinine, Ser: 1.08 mg/dL (ref 0.76–1.27)
Globulin, Total: 2.4 g/dL (ref 1.5–4.5)
Glucose: 126 mg/dL — ABNORMAL HIGH (ref 65–99)
Potassium: 3.9 mmol/L (ref 3.5–5.2)
Sodium: 140 mmol/L (ref 134–144)
Total Protein: 6.6 g/dL (ref 6.0–8.5)
eGFR: 71 mL/min/{1.73_m2} (ref >59)

## 2020-11-19 LAB — MICROALBUMIN / CREATININE URINE RATIO
Creatinine, Urine: 142.4 mg/dL
Microalb/Creat Ratio: 7 mg/g creat (ref 0–29)
Microalbumin, Urine: 9.3 ug/mL

## 2020-11-19 LAB — HEMOGLOBIN A1C
Est. average glucose Bld gHb Est-mCnc: 126 mg/dL
Hgb A1c MFr Bld: 6 % — ABNORMAL HIGH (ref 4.8–5.6)

## 2020-11-19 LAB — LIPID PANEL WITH LDL/HDL RATIO
Cholesterol, Total: 126 mg/dL (ref 100–199)
HDL: 53 mg/dL (ref >39)
LDL Chol Calc (NIH): 59 mg/dL (ref 0–99)
LDL/HDL Ratio: 1.1 ratio (ref 0.0–3.6)
Triglycerides: 70 mg/dL (ref 0–149)
VLDL Cholesterol Cal: 14 mg/dL (ref 5–40)

## 2020-11-20 ENCOUNTER — Encounter: Payer: Self-pay | Admitting: Nurse Practitioner

## 2020-11-20 ENCOUNTER — Ambulatory Visit (INDEPENDENT_AMBULATORY_CARE_PROVIDER_SITE_OTHER): Payer: PPO | Admitting: Nurse Practitioner

## 2020-11-20 ENCOUNTER — Other Ambulatory Visit: Payer: Self-pay

## 2020-11-20 DIAGNOSIS — E785 Hyperlipidemia, unspecified: Secondary | ICD-10-CM

## 2020-11-20 DIAGNOSIS — E1165 Type 2 diabetes mellitus with hyperglycemia: Secondary | ICD-10-CM

## 2020-11-20 DIAGNOSIS — I10 Essential (primary) hypertension: Secondary | ICD-10-CM | POA: Diagnosis not present

## 2020-11-20 NOTE — Patient Instructions (Signed)
Please have fasting labs drawn 2-3 days prior to your appointment so we can discuss the results during your office visit.  

## 2020-11-20 NOTE — Assessment & Plan Note (Addendum)
Lab Results  Component Value Date   HGBA1C 6.0 (H) 11/17/2020   -doing well -continue metformin -no statin d/t intolerance -not currently on ACEi/ARB; check for microalbuminuria with next labs

## 2020-11-20 NOTE — Assessment & Plan Note (Signed)
Lab Results  Component Value Date   CHOL 126 11/17/2020   HDL 53 11/17/2020   LDLCALC 59 11/17/2020   TRIG 70 11/17/2020   CHOLHDL 4.4 12/08/2012   -doing well without statin

## 2020-11-20 NOTE — Progress Notes (Signed)
Acute Office Visit  Subjective:    Patient ID: [name], male    DOB: 1941-01-04, 80 y.o.   MRN: [id number]  Chief Complaint  Patient presents with   Hypertension    Follow up    Hypertension  Patient is in today for lab follow-up.  He has hx of DM, HTN, and HLD. Denies adverse med effects.   Past Medical History:  Diagnosis Date   AAA (abdominal aortic aneurysm) ([name])    AKI (acute kidney injury) ([name]) 04/04/2016   Aneurysm ([name])    Arthritis    to lower back   Chest pain 07/17/2011   Not clearly of cardiac origin.  Will rx empirically with a PPI and assess response.  Admitted to [facility] in 06/2011; echo-moderate LVH, normal EF, mild aortic root dilatation; stress nuclear-normal perfusion, normal EF.    Colon polyps    COPD (chronic obstructive pulmonary disease) ([name])    COVID-19    Degenerative joint disease    had disc fusion in the past   Hearing deficit, bilateral    left worse than right   History of blood transfusion 12/2007   "bleeding for Warfarin"   Hyperlipidemia    Hypertension    Admitted for chest pain and 06/2011-neg stress Myoview,mod. 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 s/p Watchman   Pneumonia    Renal hemorrhage, right 2009   2009 while anticoagulated with Coumadin; treated with the renal artery embolization   Renal hemorrhage, right    2009 while anticoagulated with Coumadin; treated with the renal artery embolization    [place]is [name] 04/04/2016    Past Surgical History:  Procedure Laterality Date   ABDOMINAL AORTIC ENDOVASCULAR FENESTRATED STENT GRAFT N/A 05/08/2020   Procedure: ABDOMINAL AORTIC ENDOVASCULAR FENESTRATED STENT GRAFT;  Surgeon: CMarty Heck MD;  [facility]  Service: Vascular;  Laterality: N/A;   BIOPSY   02/21/2017   Procedure: BIOPSY;  Surgeon: FDanie Binder MD;  [facility];  Service: Endoscopy;;  gastric   COLONOSCOPY N/A 02/21/2017   multiple polyps, one requiring piecemeal removal with epi and clip placement, multiple small and large mouthed diverticula, recto-sigmoid and sigmoid with significant excess looping, internal hemorrhoids. Path with one advanced adenoma, 3 simple adenomas, 2 hyperplastic polyps. Survelilance 3 years    EMBOLIZATION  12/2007   Left renal hematoma, status post angiogram and embolization/notes 09/29/2010   ESOPHAGOGASTRODUODENOSCOPY N/A 02/21/2017   benign-appearing esophageal stenosis s/p dilation, gastritis, scarred mucosa in pylorus due to prior PUD. reactive gastropathy on path   FRACTURE SURGERY     LEFT ATRIAL APPENDAGE OCCLUSION N/A 03/18/2016   Procedure: LEFT ATRIAL APPENDAGE OCCLUSION;  Surgeon: JThompson Grayer MD;  [facility];  Service: Cardiovascular;  Laterality: N/A;   ORIF FEMUR FRACTURE Right 1960   "2 steel rods in leg"   PATCH ANGIOPLASTY Left 05/08/2020   Procedure: PATCH ANGIOPLASTY LEFT FEMORAL ARTERY;  Surgeon: CMarty Heck MD;  [facility]  Service: Vascular;  Laterality: Left;   PILONIDAL CYST EXCISION     POLYPECTOMY  02/21/2017   Procedure: POLYPECTOMY;  Surgeon: FDanie Binder MD;  [facility];  Service: Endoscopy;;  colon   REPAIR ILIAC ARTERY Left 05/08/2020  Procedure: EXPLORATION AND REPAIR LEFT FEMORAL ARTERY;  Surgeon: [name], MD;  Location: [place];  Service: Vascular;  Laterality: Left;   SAVORY DILATION N/A 02/21/2017   Procedure: SAVORY DILATION;  Surgeon: Danie Binder, MD;  [facility];  Service: Endoscopy;  Laterality: N/A;   TEE WITHOUT CARDIOVERSION N/A 03/10/2016   Procedure: TRANSESOPHAGEAL ECHOCARDIOGRAM (TEE);  Surgeon: Josue Hector, MD;  Location: [facility] ENDOSCOPY;  Service: Cardiovascular;  Laterality: N/A;   TEE WITHOUT CARDIOVERSION N/A  04/30/2016   Procedure: TRANSESOPHAGEAL ECHOCARDIOGRAM (TEE);  Surgeon: Josue Hector, MD;  Location: [facility]COPY;  Service: Cardiovascular;  Laterality: N/A;   ULTRASOUND GUIDANCE FOR VASCULAR ACCESS Bilateral 05/08/2020   Procedure: ULTRASOUND GUIDANCE FOR VASCULAR ACCESS, bilateral femoral arteries;  Surgeon: [name], MD;  Location: [facility] OR;  Service: Vascular;  Laterality: Bilateral;   Watchman procedure  02/2016    Family History  Problem Relation Age of Onset   Hypertension Mother    Hyperlipidemia Mother    Renal cancer Mother    Atrial fibrillation Sister    Stroke Father    Dementia Father    Colon cancer Cousin    Heart disease Brother    Colon polyps Neg Hx     Social History   Socioeconomic History   Marital status: Married    Spouse name: Not on file   Number of children: 2   Years of education: Not on file   Highest education level: Not on file  Occupational History   Occupation: Retired    Comment: Chief Technology Officer job  Tobacco Use   Smoking status: Former    Packs/day: 1.00    Years: 40.00    Pack years: 40.00    Types: Cigarettes    Quit date: 01/20/2008    Years since quitting: 12.8   Smokeless tobacco: Never  Vaping Use   Vaping Use: Never used  Substance and Sexual Activity   Alcohol use: Not Currently    Alcohol/week: 1.0 standard drink    Types: 1 Standard drinks or equivalent per week    Comment: rare wine   Drug use: No   Sexual activity: Yes  Other Topics Concern   Not on file  Social History Narrative   Retired from Architect   Lives in [place] Resource Strain: Low Risk    Difficulty of Paying Living Expenses: Not hard at all  Food Insecurity: No Food Insecurity   Worried About Charity fundraiser in the Last Year: Never true   Arboriculturist in the Last Year: Never true  Transportation Needs: No Transportation Needs   Lack of Transportation (Medical): No    Lack of Transportation (Non-Medical): No  Physical Activity: Insufficiently Active   Days of Exercise per Week: 2 days   Minutes of Exercise per Session: 40 min  Stress: No Stress Concern Present   Feeling of Stress : Not at all  Social Connections: Socially Integrated   Frequency of Communication with Friends and Family: More than three times a week   Frequency of Social Gatherings with Friends and Family: Twice a week   Attends Religious Services: More than 4 times per year   Active Member of [employer] or Organizations: Yes   Attends Music therapist: More than 4 times per year   Marital Status: Married  Human resources officer Violence: Not At Risk   Fear of Current or Ex-Partner: No  Emotionally Abused: No   Physically Abused: No   Sexually Abused: No    Outpatient Medications Prior to Visit  Medication Sig Dispense Refill   acetaminophen (TYLENOL) 500 MG tablet Take 1,000 mg by mouth every 8 (eight) hours as needed (pain).     albuterol (VENTOLIN HFA) 108 (90 Base) MCG/ACT inhaler Inhale 1-2 puffs into the lungs every 6 (six) hours as needed for wheezing or shortness of breath. 18 g 1   ALPRAZolam (XANAX) 0.5 MG tablet Take 1-2 pills as needed on call to MRI.  May take a 3rd pill if needed. (Patient taking differently: Take 1-2 pills as needed on call to MRI.  May take a 3rd pill if needed.) 3 tablet 0   ascorbic acid (VITAMIN C) 500 MG tablet Take 2 tablets by mouth as needed.     aspirin 81 MG chewable tablet      aspirin EC 81 MG tablet Take 81 mg by mouth daily. Swallow whole.     blood glucose meter kit and supplies Dispense based on patient and insurance preference. Use to monitor blood sugar daily. E11.65 1 each 0   cetirizine (ZYRTEC) 10 MG tablet Take 10 mg by mouth at bedtime.      cholecalciferol (VITAMIN D3) 25 MCG (1000 UNIT) tablet Take 1,000 Units by mouth daily.     clopidogrel (PLAVIX) 75 MG tablet Take 1 tablet (75 mg total) by mouth daily at 6 (six) AM. 90  tablet 3   diltiazem (CARDIZEM CD) 240 MG 24 hr capsule Take 1 capsule (240 mg total) by mouth at bedtime. 90 capsule 1   fluticasone (FLONASE) 50 MCG/ACT nasal spray Place into the nose.     Fluticasone Furoate (ARNUITY ELLIPTA) 50 MCG/ACT AEPB      Lancets (ONETOUCH DELICA PLUS [id number]) MISC USE TO TEST BLOOD SUGARADAILY AS DIRECTED     LORazepam (ATIVAN) 0.5 MG tablet Take 1 tablet (0.5 mg total) by mouth at bedtime. 90 tablet 1   meclizine (ANTIVERT) 25 MG tablet Take 25 mg by mouth 2 (two) times daily. For dizziness May take an additional dose at [facility] if needed     metFORMIN (GLUCOPHAGE) 500 MG tablet Take 1 tablet (500 mg total) by mouth daily with breakfast. 90 tablet 1   metoprolol succinate (TOPROL-XL) 25 MG 24 hr tablet Take 0.5 tablets (12.5 mg total) by mouth daily. 90 tablet 1   mirabegron ER (MYRBETRIQ) 50 MG TB24 tablet Take 1 tablet by mouth at bedtime.     Multiple Vitamin (MULTIVITAMIN) tablet Take 1 tablet by mouth daily.     ONETOUCH VERIO test strip USE TO TEST BLOOD SUGARADAILY AS DIRECTED     pantoprazole (PROTONIX) 40 MG tablet Take 1 tablet (40 mg total) by mouth 2 (two) times daily. 180 tablet 3   tamsulosin (FLOMAX) 0.4 MG CAPS capsule Take 0.4 mg by mouth in the morning and at bedtime.      triamterene-hydrochlorothiazide (MAXZIDE-25) 37.5-25 MG tablet Take 1 tablet by mouth daily. 90 tablet 1   No facility-administered medications prior to visit.    Allergies  Allergen Reactions   Other Swelling       Substance With 3-hydroxy-3-methylglutaryl-coenzyme A Reductase Inhibitor Mechanism Of Action (substance)      Statins Other (See Comments)    Muscle aches Muscle aches   Rivaroxaban Other (See Comments)    Developed redness in both legs Developed redness in both legs    Review of Systems  Constitutional: Negative.  Respiratory: Negative.    Cardiovascular: Negative.   Musculoskeletal: Negative.   Psychiatric/Behavioral: Negative.         Objective:    Physical Exam Constitutional:      Appearance: Normal appearance.  Cardiovascular:     Rate and Rhythm: Normal rate and regular rhythm.     Pulses: Normal pulses.     Heart sounds: Normal heart sounds.  Pulmonary:     Effort: Pulmonary effort is normal.     Breath sounds: Normal breath sounds.  Neurological:     Mental Status: He is alert.  Psychiatric:        Mood and Affect: Mood normal.        Behavior: Behavior normal.        Thought Content: Thought content normal.        Judgment: Judgment normal.    BP 129/80 (BP Location: Right Arm, Patient Position: Sitting, Cuff Size: Large)   Pulse 67   Temp 98 F (36.7 C) (Temporal)   Ht _0  (1.702 m)   Wt 204 lb (92.5 kg)   SpO2 96%   BMI 31.95 kg/m  Wt Readings from Last 3 Encounters:  11/20/20 204 lb (92.5 kg)  11/05/20 206 lb (93.4 kg)  08/28/20 211 lb 5 oz (95.9 kg)    Health Maintenance Due  Topic Date Due   FOOT EXAM  Never done   OPHTHALMOLOGY EXAM  Never done   Zoster Vaccines- Shingrix (1 of 2) Never done   COLONOSCOPY (Pts 45-49yr Insurance coverage will need to be confirmed)  02/22/2020    There are no preventive care reminders to display for this patient.   Lab Results  Component Value Date   TSH 3.758 06/19/2012   Lab Results  Component Value Date   WBC 5.4 11/17/2020   HGB 11.6 (L) 11/17/2020   HCT 35.1 (L) 11/17/2020   MCV 86 11/17/2020   PLT 226 11/17/2020   Lab Results  Component Value Date   NA 140 11/17/2020   K 3.9 11/17/2020   CO2 28 11/17/2020   GLUCOSE 126 (H) 11/17/2020   BUN 13 11/17/2020   CREATININE 1.08 11/17/2020   BILITOT 0.6 11/17/2020   ALKPHOS 42 (L) 11/17/2020   AST 15 11/17/2020   ALT 12 11/17/2020   PROT 6.6 11/17/2020   ALBUMIN 4.2 11/17/2020   CALCIUM 9.2 11/17/2020   ANIONGAP 9 11/05/2020   EGFR 71 11/17/2020   Lab Results  Component Value Date   CHOL 126 11/17/2020   Lab Results  Component Value Date   HDL 53 11/17/2020   Lab  Results  Component Value Date   LDLCALC 59 11/17/2020   Lab Results  Component Value Date   TRIG 70 11/17/2020   Lab Results  Component Value Date   CHOLHDL 4.4 12/08/2012   Lab Results  Component Value Date   HGBA1C 6.0 (H) 11/17/2020       Assessment & Plan:   Problem List Items Addressed This Visit   None    No orders of the defined types were placed in this encounter.    JNoreene Larsson NP

## 2020-11-20 NOTE — Assessment & Plan Note (Signed)
BP Readings from Last 3 Encounters:  11/20/20 129/80  11/05/20 126/62  08/28/20 134/84   -well controlled today; no change to antihypertensives

## 2020-11-24 ENCOUNTER — Other Ambulatory Visit: Payer: Self-pay

## 2020-11-24 ENCOUNTER — Ambulatory Visit (AMBULATORY_SURGERY_CENTER): Payer: PPO

## 2020-11-24 VITALS — Ht 67.0 in | Wt 204.0 lb

## 2020-11-24 DIAGNOSIS — R1319 Other dysphagia: Secondary | ICD-10-CM

## 2020-11-24 DIAGNOSIS — K2970 Gastritis, unspecified, without bleeding: Secondary | ICD-10-CM

## 2020-11-24 DIAGNOSIS — Z86010 Personal history of colonic polyps: Secondary | ICD-10-CM

## 2020-11-24 DIAGNOSIS — R933 Abnormal findings on diagnostic imaging of other parts of digestive tract: Secondary | ICD-10-CM

## 2020-11-24 NOTE — Progress Notes (Signed)
Patient's pre-visit was done today over the phone with the patient due to COVID-19 pandemic.   Name,DOB and address verified. Insurance verified. Patient denies any allergies to Eggs and Soy. Patient denies any problems with anesthesia/sedation. Patient denies taking diet pills or blood thinners. Packet of Prep instructions mailed to patient including a copy of a consent form-pt is aware. Patient understands to call us back with any questions or concerns. Patient is aware of our care-partner policy and OMBTD-[id number] safety protocol.    The patient is COVID-19 vaccinated, per patient.

## 2020-12-02 ENCOUNTER — Ambulatory Visit (AMBULATORY_SURGERY_CENTER): Payer: PPO | Admitting: Gastroenterology

## 2020-12-02 ENCOUNTER — Encounter: Payer: Self-pay | Admitting: Gastroenterology

## 2020-12-02 ENCOUNTER — Other Ambulatory Visit: Payer: Self-pay

## 2020-12-02 VITALS — BP 134/81 | HR 63 | Temp 96.5°F | Resp 12 | Ht 67.0 in | Wt 204.0 lb

## 2020-12-02 DIAGNOSIS — D126 Benign neoplasm of colon, unspecified: Secondary | ICD-10-CM

## 2020-12-02 DIAGNOSIS — K3189 Other diseases of stomach and duodenum: Secondary | ICD-10-CM | POA: Diagnosis not present

## 2020-12-02 DIAGNOSIS — D125 Benign neoplasm of sigmoid colon: Secondary | ICD-10-CM

## 2020-12-02 DIAGNOSIS — R1319 Other dysphagia: Secondary | ICD-10-CM

## 2020-12-02 DIAGNOSIS — Z1211 Encounter for screening for malignant neoplasm of colon: Secondary | ICD-10-CM | POA: Diagnosis not present

## 2020-12-02 DIAGNOSIS — D120 Benign neoplasm of cecum: Secondary | ICD-10-CM | POA: Diagnosis not present

## 2020-12-02 DIAGNOSIS — K2970 Gastritis, unspecified, without bleeding: Secondary | ICD-10-CM | POA: Diagnosis not present

## 2020-12-02 DIAGNOSIS — K317 Polyp of stomach and duodenum: Secondary | ICD-10-CM

## 2020-12-02 DIAGNOSIS — D124 Benign neoplasm of descending colon: Secondary | ICD-10-CM | POA: Diagnosis not present

## 2020-12-02 DIAGNOSIS — Z86010 Personal history of colonic polyps: Secondary | ICD-10-CM

## 2020-12-02 DIAGNOSIS — R933 Abnormal findings on diagnostic imaging of other parts of digestive tract: Secondary | ICD-10-CM

## 2020-12-02 DIAGNOSIS — K208 Other esophagitis without bleeding: Secondary | ICD-10-CM | POA: Diagnosis not present

## 2020-12-02 DIAGNOSIS — D374 Neoplasm of uncertain behavior of colon: Secondary | ICD-10-CM

## 2020-12-02 MED ORDER — SODIUM CHLORIDE 0.9 % IV SOLN: 500.0000 mL | Freq: Once | INTRAVENOUS | Status: DC

## 2020-12-02 NOTE — Progress Notes (Signed)
Pt's states no medical or surgical changes since previsit or office visit. 

## 2020-12-02 NOTE — Op Note (Signed)
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 Surveillance colonoscopy recommended in 1-3                            years. Medicines:                Monitored Anesthesia Care Procedure:                Pre-Anesthesia Assessment:                           - Prior to the procedure, a History and Physical                            was performed, and patient medications and                            allergies were reviewed. The patient's tolerance of                            previous anesthesia was also reviewed. The risks                            and benefits of the procedure and the sedation                            options and risks were discussed with the patient.                            All questions were answered, and informed consent                            was obtained. Prior Anticoagulants: The patient has  taken Plavix (clopidogrel), last dose was 5 days                            prior to procedure. ASA Grade Assessment: III - A                            patient with severe systemic disease. After                            reviewing the risks and benefits, the patient was                            deemed in satisfactory condition to undergo the                            procedure.                           After obtaining informed consent, the  colonoscope                            was passed under direct vision. Throughout the                            procedure, the patient's blood pressure, pulse, and                            oxygen saturations were monitored continuously. The                            CF [id number] #[id number] was introduced through the anus                            and advanced to the the cecum, identified by                            appendiceal orifice and ileocecal valve. The                            colonoscopy was performed without difficulty. The                            patient tolerated the procedure well. The quality                            of the bowel preparation was good. The ileocecal                            valve, appendiceal orifice, and rectum were                            photographed. Scope In: 10:35:17 AM Scope Out: 10:58:17 AM Scope Withdrawal Time: 0 hours 14 minutes 37 seconds  Total Procedure Duration: 0 hours 23 minutes 0  seconds  Findings:                 The perianal and digital rectal examinations were                            normal.                           Non-bleeding internal hemorrhoids were found.                           Multiple small and large-mouthed diverticula were                            found in the sigmoid colon and descending colon.                           Two sessile polyps were found in the sigmoid colon.                            The polyps were 2 to 3 mm in size. These polyps                            were removed with a cold snare. Resection and                            retrieval were complete. Estimated blood loss was                            minimal.                           Three sessile polyps were found in the descending                            colon, hepatic flexure and cecum. The polyps were 2                            to 3 mm in size. These polyps were removed with a                            cold snare. Resection  and retrieval were complete.                            Estimated blood loss was minimal.                           A tattoo was present in the distal sigmoid colon.                            No residual polyp seen. The exam was otherwise                            without abnormality on direct and retroflexion  views. Complications:            No immediate complications. Estimated blood loss:                            Minimal. Estimated Blood Loss:     Estimated blood loss was minimal. Impression:               - Non-bleeding internal hemorrhoids.                           - Diverticulosis in the sigmoid colon and in the                            descending colon.                           - Two 2 to 3 mm polyps in the sigmoid colon,                            removed with a cold snare. Resected and retrieved.                           - Three 2 to 3 mm polyps in the descending colon,                            at the hepatic flexure and in the cecum, removed                            with a cold snare. Resected and retrieved.                           - The examination was otherwise normal on direct                            and retroflexion views. Recommendation:           - Patient has a contact number available for                            emergencies. The signs and symptoms of potential                            delayed complications were discussed with the                            patient. Return to normal activities tomorrow.                            Written discharge instructions were provided to the                            patient.                           - High fiber diet.                           -  Continue present medications.                           - Resume Plavix (clopidogrel) at prior dose                            tomorrow.                           - Await pathology results.                           - Repeat colonoscopy  date to be determined after                            pending pathology results are reviewed for                            surveillance.                           - Emerging evidence supports eating a diet of                            fruits, vegetables, grains, calcium, and yogurt                            while reducing red meat and alcohol may reduce the                            risk of colon cancer.                           - Thank you for allowing me to be involved in your                            colon cancer prevention. Thornton Park MD, MD 12/02/2020 11:09:36 AM This report has been signed electronically.

## 2020-12-02 NOTE — Progress Notes (Signed)
Called to room to assist during endoscopic procedure.  Patient ID and intended procedure confirmed with present staff. Received instructions for my participation in the procedure from the performing physician.  

## 2020-12-02 NOTE — Progress Notes (Signed)
pt tolerated well. VSS. awake and to recovery. Report given to RN.  

## 2020-12-02 NOTE — Op Note (Signed)
[place] Patient Name: [name] Procedure Date: 12/02/2020 10:08 AM MRN: [id number] Endoscopist: Thornton Park MD, MD Age: 80 Referring MD:  Date of Birth: 01-Apr-1941 Gender: Male Account #: [id number] Procedure:                Upper GI endoscopy Indications:              Dysphagia, Abnormal CT of the GI tract, Abnormal                            esophagram Medicines:                Monitored Anesthesia Care Procedure:                Pre-Anesthesia Assessment:                           - Prior to the procedure, a History and Physical                            was performed, and patient medications and                            allergies were reviewed. The patient's tolerance of                            previous anesthesia was also reviewed. The risks                            and benefits of the procedure and the sedation                            options and risks were discussed with the patient.                            All questions were answered, and informed consent                            was obtained. Prior Anticoagulants: The patient has                            taken Plavix (clopidogrel), last dose was 5 days                            prior to procedure. ASA Grade Assessment: III - A                            patient with severe systemic disease. After                            reviewing the risks and benefits, the patient was                            deemed in satisfactory condition to undergo the  procedure.                           After obtaining informed consent, the endoscope was                            passed under direct vision. Throughout the                            procedure, the patient's blood pressure, pulse, and                            oxygen saturations were monitored continuously. The                            GIF [id number] #[id number] was introduced through the                             mouth, and advanced to the third part of duodenum.                            The upper GI endoscopy was accomplished without                            difficulty. The patient tolerated the procedure                            well. Scope In: Scope Out: Findings:                 The examined esophagus was normal. The z-line is                            located 40cm from the incisors. No ring, web, or                            stricture. A TTS dilator was passed through the                            scope. Dilation with a 16-17-18 mm balloon dilator                            was performed to 18 mm. The fully inflated balloon                            was pulled through the esophagus without                            difficulty. The dilation site was examined and                            showed no change. Biopsies were then obtained from  the proximal and distal esophagus with cold forceps                            for histology to evaluate for eosinophilic                            esophagitis.                           A single 3 mm sessile polyp with no stigmata of                            recent bleeding was found in the gastric body. The                            polyp was removed with a cold biopsy forceps.                            Resection and retrieval were complete. Estimated                            blood loss was minimal.                           Localized mild mucosal changes characterized by a                            decreased vascular pattern were found in the                            prepyloric region of the stomach. Biopsies were                            taken with a cold forceps for histology. Estimated                            blood loss was minimal.                           The examined duodenum was normal. Complications:            No immediate complications. Estimated blood loss:                             Minimal. Estimated Blood Loss:     Estimated blood loss was minimal. Impression:               - Normal esophagus. Empirically dilated. Then,                            biopsied.                           - A single gastric polyp. Resected and retrieved.                           -  Decreased vascular pattern mucosa in the                            prepyloric region of the stomach. Biopsied.                           - No obvious exophytic mass. Recommendation:           - Patient has a contact number available for                            emergencies. The signs and symptoms of potential                            delayed complications were discussed with the                            patient. Return to normal activities tomorrow.                            Written discharge instructions were provided to the                            patient.                           - Resume previous diet.                           - Continue present medications.                           - Await pathology results.                           - Resume Plavix (clopidogrel) at prior dose                            tomorrow. Thornton Park MD, MD 12/02/2020 11:03:49 AM This report has been signed electronically.

## 2020-12-02 NOTE — Patient Instructions (Signed)
YOU HAD AN ENDOSCOPIC PROCEDURE TODAY AT [facility]:   Refer to the procedure report that was given to you for any specific questions about what was found during the examination.  If the procedure report does not answer your questions, please call your gastroenterologist to clarify.  If you requested that your care partner not be given the details of your procedure findings, then the procedure report has been included in a sealed envelope for you to review at your convenience later.  YOU SHOULD EXPECT: Some feelings of bloating in the abdomen. Passage of more gas than usual.  Walking can help get rid of the air that was put into your GI tract during the procedure and reduce the bloating. If you had a lower endoscopy (such as a colonoscopy or flexible sigmoidoscopy) you may notice spotting of blood in your stool or on the toilet paper. If you underwent a bowel prep for your procedure, you may not have a normal bowel movement for a few days.  Please Note:  You might notice some irritation and congestion in your nose or some drainage.  This is from the oxygen used during your procedure.  There is no need for concern and it should clear up in a day or so.  SYMPTOMS TO REPORT IMMEDIATELY:  Following lower endoscopy (colonoscopy or flexible sigmoidoscopy):  Excessive amounts of blood in the stool  Significant tenderness or worsening of abdominal pains  Swelling of the abdomen that is new, acute  Fever of 100F or higher  Following upper endoscopy (EGD)  Vomiting of blood or coffee ground material  New chest pain or pain under the shoulder blades  Painful or persistently difficult swallowing  New shortness of breath  Fever of 100F or higher  Black, tarry-looking stools  For urgent or emergent issues, a gastroenterologist can be reached at any hour by calling [phone]. Do not use MyChart messaging for urgent concerns.    DIET:  We do recommend a small meal at first, but  then you may proceed to your regular diet.  Drink plenty of fluids but you should avoid alcoholic beverages for 24 hours.  ACTIVITY:  You should plan to take it easy for the rest of today and you should NOT DRIVE or use heavy machinery until tomorrow (because of the sedation medicines used during the test).    FOLLOW UP: Our staff will call the number listed on your records 48-72 hours following your procedure to check on you and address any questions or concerns that you may have regarding the information given to you following your procedure. If we do not reach you, we will leave a message.  We will attempt to reach you two times.  During this call, we will ask if you have developed any symptoms of COVID 19. If you develop any symptoms (ie: fever, flu-like symptoms, shortness of breath, cough etc.) before then, please call [phone].  If you test positive for Covid 19 in the 2 weeks post procedure, please call and report this information to [place].    If any biopsies were taken you will be contacted by phone or by letter within the next 1-3 weeks.  Please call us at [phone] if you have not heard about the biopsies in 3 weeks.    SIGNATURES/CONFIDENTIALITY: You and/or your care partner have signed paperwork which will be entered into your electronic medical record.  These signatures attest to the fact that that the information above on your After  Visit Summary has been reviewed and is understood.  Full responsibility of the confidentiality of this discharge information lies with you and/or your care-partner.    RESUME PLAVIX 12/03/20,MAY RESUME REMAINDER OF MEDICATIONS TODAY 12/02/20.FOLLOW RECOMMENDATIONS ON REPORT. INFORMATION GIVEN ON POLYPS.

## 2020-12-02 NOTE — Progress Notes (Signed)
VS taken by S.M.

## 2020-12-04 ENCOUNTER — Telehealth: Payer: Self-pay

## 2020-12-04 ENCOUNTER — Telehealth: Payer: Self-pay | Admitting: *Deleted

## 2020-12-04 NOTE — Telephone Encounter (Signed)
  Follow up Call-  Call back number 12/02/2020  Post procedure Call Back phone  # [phone]  Permission to leave phone message Yes  Some recent data might be hidden     Patient questions:  Do you have a fever, pain , or abdominal swelling? No. Pain Score  0 *  Have you tolerated food without any problems? Yes.    Have you been able to return to your normal activities? Yes.    Do you have any questions about your discharge instructions: Diet   No. Medications  No. Follow up visit  No.  Do you have questions or concerns about your Care? No.  Actions: * If pain score is 4 or above: No action needed, pain <4.  Have you developed a fever since your procedure? no  2.   Have you had an respiratory symptoms (SOB or cough) since your procedure? no  3.   Have you tested positive for COVID 19 since your procedure no  4.   Have you had any family members/close contacts diagnosed with the COVID 19 since your procedure?  no   If yes to any of these questions please route to Joylene John, RN and Joella Prince, RN

## 2020-12-04 NOTE — Telephone Encounter (Signed)
Message left

## 2020-12-09 ENCOUNTER — Encounter: Payer: Self-pay | Admitting: Vascular Surgery

## 2020-12-09 ENCOUNTER — Ambulatory Visit: Payer: PPO | Admitting: Vascular Surgery

## 2020-12-09 ENCOUNTER — Other Ambulatory Visit: Payer: Self-pay

## 2020-12-09 ENCOUNTER — Ambulatory Visit (HOSPITAL_COMMUNITY)
Admission: RE | Admit: 2020-12-09 | Discharge: 2020-12-09 | Disposition: A | Discharge status: Home / Self Care | Payer: PPO | Source: Ambulatory Visit | Attending: Vascular Surgery | Admitting: Vascular Surgery

## 2020-12-09 ENCOUNTER — Ambulatory Visit (INDEPENDENT_AMBULATORY_CARE_PROVIDER_SITE_OTHER)
Admission: RE | Admit: 2020-12-09 | Discharge: 2020-12-09 | Disposition: A | Discharge status: Home / Self Care | Payer: PPO | Source: Ambulatory Visit | Attending: Vascular Surgery | Admitting: Vascular Surgery

## 2020-12-09 VITALS — BP 146/85 | HR 58 | Ht 67.0 in | Wt 203.4 lb

## 2020-12-09 DIAGNOSIS — I714 Abdominal aortic aneurysm, without rupture, unspecified: Secondary | ICD-10-CM

## 2020-12-09 NOTE — Progress Notes (Signed)
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 male presents for 6 month follow-up after recent fenestrated endograft on 05/08/2020.  This was for 6 cm juxtarenal AAA.  Ultimately used [facility] device including bilateral small renal fenestrations and SMA scallop.  Both renal arteries were treated with VBX stents.  He did require cutdown on the left groin for repair of the common femoral artery after the closure device failed.  Only complaint today is still a bit of left groin numbness at times.  Past Medical History:  Diagnosis Date   A-fib [facility])    surgerical procedure repair.   AAA (abdominal aortic aneurysm) ([place])    AKI (acute kidney injury) ([place]) 04/04/2016   Aneurysm ([place])    Arthritis    to lower back   Chest pain 07/17/2011   Not clearly of cardiac origin.  Will rx empirically with a PPI and assess response.  Admitted to [facility] in 06/2011; echo-moderate LVH, normal EF, mild aortic root dilatation; stress nuclear-normal perfusion, normal EF.    Colon polyps    COPD (chronic obstructive pulmonary disease) ([place])    COVID-19    Degenerative joint disease    had disc fusion in the past   Diabetes mellitus without complication (HCC)    Hearing deficit, bilateral    left worse than right   History of blood transfusion 12/2007   "bleeding for Warfarin"   Hyperlipidemia    Hypertension    Admitted for chest pain and 06/2011-neg stress Myoview,mod. 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 s/p Watchman   Pneumonia    Renal hemorrhage, right 2009   2009 while anticoagulated with Coumadin; treated with the renal artery embolization   Renal  hemorrhage, right    2009 while anticoagulated with Coumadin; treated with the renal artery embolization    Sepsis ([place] 04/04/2016    Past Surgical History:  Procedure Laterality Date   ABDOMINAL AORTIC ENDOVASCULAR FENESTRATED STENT GRAFT N/A 05/08/2020   Procedure: ABDOMINAL AORTIC ENDOVASCULAR FENESTRATED STENT GRAFT;  Surgeon: CMarty Heck MD;  [place] [facility]  Service: Vascular;  Laterality: N/A;   BIOPSY  02/21/2017   Procedure: BIOPSY;  Surgeon: FDanie Binder MD;  [facility];  Service: Endoscopy;;  gastric   COLONOSCOPY N/A 02/21/2017   multiple polyps, one requiring piecemeal removal with epi and clip placement, multiple small and large mouthed diverticula, recto-sigmoid and sigmoid with significant excess looping, internal hemorrhoids. Path with one advanced adenoma, 3 simple adenomas, 2 hyperplastic polyps. Survelilance 3 years    EMBOLIZATION  12/2007   Left renal hematoma, status post angiogram and embolization/notes 09/29/2010   ESOPHAGOGASTRODUODENOSCOPY N/A 02/21/2017   benign-appearing esophageal stenosis s/p dilation, gastritis, scarred mucosa in pylorus due to prior PUD. reactive gastropathy on path   FRACTURE SURGERY     LEFT ATRIAL APPENDAGE OCCLUSION N/A 03/18/2016   Procedure: LEFT ATRIAL APPENDAGE OCCLUSION;  Surgeon: JThompson Grayer MD;  [facility];  Service: Cardiovascular;  Laterality: N/A;   ORIF FEMUR FRACTURE Right 1960   "2 steel rods in leg"   PATCH ANGIOPLASTY  Left 05/08/2020   Procedure: PATCH ANGIOPLASTY LEFT FEMORAL ARTERY;  Surgeon: [name], MD;  Location: [address];  Service: Vascular;  Laterality: Left;   PILONIDAL CYST EXCISION     POLYPECTOMY  02/21/2017   Procedure: POLYPECTOMY;  Surgeon: Danie Binder, MD;  [facility];  Service: Endoscopy;;  colon   REPAIR ILIAC ARTERY Left 05/08/2020   Procedure: EXPLORATION AND REPAIR LEFT FEMORAL ARTERY;  Surgeon: [name], MD;  Location: [place];  Service: Vascular;  Laterality: Left;   SAVORY DILATION N/A 02/21/2017   Procedure: SAVORY DILATION;  Surgeon: Danie Binder, MD;  [facility];  Service: Endoscopy;  Laterality: N/A;   TEE WITHOUT CARDIOVERSION N/A 03/10/2016   Procedure: TRANSESOPHAGEAL ECHOCARDIOGRAM (TEE);  Surgeon: Josue Hector, MD;  Location[facility];  Service: Cardiovascular;  Laterality: N/A;   TEE WITHOUT CARDIOVERSION N/A 04/30/2016   Procedure: TRANSESOPHAGEAL ECHOCARDIOGRAM (TEE);  Surgeon: Josue Hector, MD;  Location: [facility];  Service: Cardiovascular;  Laterality: N/A;   ULTRASOUND GUIDANCE FOR VASCULAR ACCESS Bilateral 05/08/2020   Procedure: ULTRASOUND GUIDANCE FOR VASCULAR ACCESS, bilateral femoral arteries;  Surgeon: [name], MD;  [facility];  Service: Vascular;  Laterality: Bilateral;   Watchman procedure  02/2016    Family History  Problem Relation Age of Onset   Hypertension Mother    Hyperlipidemia Mother    Renal cancer Mother    Stroke Father    Dementia Father    Atrial fibrillation Sister    Heart disease Brother    Colon cancer Cousin    Colon polyps Neg Hx    Esophageal cancer Neg Hx    Rectal cancer Neg Hx    Stomach cancer Neg Hx     SOCIAL HISTORY: Social History   Tobacco Use   Smoking status: Former    Packs/day: 1.00    Years: 40.00    Pack years: 40.00    Types: Cigarettes    Quit date: 01/20/2008    Years since quitting: 12.8   Smokeless tobacco: Never  Substance Use Topics   Alcohol use: Not Currently    Alcohol/week: 1.0 standard drink    Types: 1 Standard drinks or equivalent per week    Comment: rare wine    Allergies  Allergen Reactions   Other Swelling       Substance With 3-hydroxy-3-methylglutaryl-coenzyme A Reductase Inhibitor Mechanism Of Action (substance)      Statins Other (See Comments)    Muscle aches Muscle aches   Rivaroxaban Other (See Comments)    Developed redness in both legs Developed  redness in both legs    Current Outpatient Medications  Medication Sig Dispense Refill   acetaminophen (TYLENOL) 500 MG tablet Take 1,000 mg by mouth every 8 (eight) hours as needed (pain).     ascorbic acid (VITAMIN C) 500 MG tablet Take 2 tablets by mouth as needed.     aspirin EC 81 MG tablet Take 81 mg by mouth daily. Swallow whole.     blood glucose meter kit and supplies Dispense based on patient and insurance preference. Use to monitor blood sugar daily. E11.65 1 each 0   cetirizine (ZYRTEC) 10 MG tablet Take 10 mg by mouth at bedtime.      cholecalciferol (VITAMIN D3) 25 MCG (1000 UNIT) tablet Take 1,000 Units by mouth daily.     clopidogrel (PLAVIX) 75 MG tablet Take 1 tablet (75 mg total) by mouth daily at 6 (six)  AM. 90 tablet 3   diltiazem (CARDIZEM CD) 240 MG 24 hr capsule Take 1 capsule (240 mg total) by mouth at bedtime. 90 capsule 1   fluticasone (FLONASE) 50 MCG/ACT nasal spray Place into the nose.     Lancets (ONETOUCH DELICA PLUS [id number]) MISC USE TO TEST BLOOD SUGARADAILY AS DIRECTED     LORazepam (ATIVAN) 0.5 MG tablet Take 1 tablet (0.5 mg total) by mouth at bedtime. 90 tablet 1   meclizine (ANTIVERT) 25 MG tablet Take 25 mg by mouth 2 (two) times daily. For dizziness May take an additional dose at [facility] if needed     metFORMIN (GLUCOPHAGE) 500 MG tablet Take 1 tablet (500 mg total) by mouth daily with breakfast. 90 tablet 1   metoprolol succinate (TOPROL-XL) 25 MG 24 hr tablet Take 0.5 tablets (12.5 mg total) by mouth daily. 90 tablet 1   mirabegron ER (MYRBETRIQ) 50 MG TB24 tablet Take 1 tablet by mouth at bedtime.     Multiple Vitamin (MULTIVITAMIN) tablet Take 1 tablet by mouth daily.     ONETOUCH VERIO test strip USE TO TEST BLOOD SUGARADAILY AS DIRECTED     pantoprazole (PROTONIX) 40 MG tablet Take 1 tablet (40 mg total) by mouth 2 (two) times daily. 180 tablet 3   rosuvastatin (CRESTOR) 5 MG tablet Take 5 mg by mouth daily.     tamsulosin (FLOMAX) 0.4 MG CAPS  capsule Take 0.4 mg by mouth in the morning and at bedtime.      triamterene-hydrochlorothiazide (MAXZIDE-25) 37.5-25 MG tablet Take 1 tablet by mouth daily. 90 tablet 1   albuterol (VENTOLIN HFA) 108 (90 Base) MCG/ACT inhaler Inhale 1-2 puffs into the lungs every 6 (six) hours as needed for wheezing or shortness of breath. (Patient not taking: No sig reported) 18 g 1   ALPRAZolam (XANAX) 0.5 MG tablet Take 1-2 pills as needed on call to MRI.  May take a 3rd pill if needed. (Patient not taking: Reported on 12/09/2020) 3 tablet 0   Fluticasone Furoate (ARNUITY ELLIPTA) 50 MCG/ACT AEPB  (Patient not taking: No sig reported)     No current facility-administered medications for this visit.    REVIEW OF SYSTEMS:  [X]  denotes positive finding, [ ]  denotes negative finding Cardiac  Comments:  Chest pain or chest pressure:    Shortness of breath upon exertion:    Short of breath when lying flat:    Irregular heart rhythm:        Vascular    Pain in calf, thigh, or hip brought on by ambulation:    Pain in feet at night that wakes you up from your sleep:     Blood clot in your veins:    Leg swelling:         Pulmonary    Oxygen at home:    Productive cough:     Wheezing:         Neurologic    Sudden weakness in arms or legs:     Sudden numbness in arms or legs:     Sudden onset of difficulty speaking or slurred speech:    Temporary loss of vision in one eye:     Problems with dizziness:         Gastrointestinal    Blood in stool:     Vomited blood:         Genitourinary    Burning when urinating:     Blood in urine:  Psychiatric    Major depression:         Hematologic    Bleeding problems:    Problems with blood clotting too easily:        Skin    Rashes or ulcers:        Constitutional    Fever or chills:      PHYSICAL EXAM: Vitals:   12/09/20 0921  BP: (!) 146/85  Pulse: (!) 58  SpO2: 97%  Weight: 203 lb 6.4 oz (92.3 kg)  Height: 5' 7"  (1.702 m)     GENERAL: The patient is a well-nourished male, in no acute distress. The vital signs are documented above. CARDIAC: There is a regular rate and rhythm.  VASCULAR:  Bilateral femoral pulses palpable Left groin incision healed Bilateral PT pulses palpable  DATA:   Both renal artery stents are patent by duplex today  EVAR duplex today shows no evidence of endoleak but the aneurysm sac has increased from 6 to 6.6 cm  Assessment/Plan:  80 year old male status post fenestrated endograft using the [facility] Zfen device with 2 small renal fenestrations and SMA scallop on 05/08/2020.  His postop CT at 1 month showed no endoleak with excellent seal and both renal stents patent.  On duplex today renal stents remains both patent and endograft patent with no endoleak.  There is concern that his aneurysm has increased in size from 6.0 to 6.6 cm even though no endoleak was identified.  I did discuss potentially a CTA abdomen pelvis to further evaluate.  Ultimately he wants to delay and would prefer to see me again in 6 months with another ultrasound study.  We will order renal duplex and EVAR duplex in 6 months.    [name], MD [facility] Office: [phone]

## 2020-12-10 ENCOUNTER — Other Ambulatory Visit: Payer: Self-pay

## 2020-12-10 DIAGNOSIS — I714 Abdominal aortic aneurysm, without rupture, unspecified: Secondary | ICD-10-CM

## 2020-12-15 DIAGNOSIS — H35033 Hypertensive retinopathy, bilateral: Secondary | ICD-10-CM | POA: Diagnosis not present

## 2020-12-15 DIAGNOSIS — H25032 Anterior subcapsular polar age-related cataract, left eye: Secondary | ICD-10-CM | POA: Diagnosis not present

## 2020-12-15 DIAGNOSIS — H5201 Hypermetropia, right eye: Secondary | ICD-10-CM | POA: Diagnosis not present

## 2020-12-15 DIAGNOSIS — H52221 Regular astigmatism, right eye: Secondary | ICD-10-CM | POA: Diagnosis not present

## 2020-12-15 DIAGNOSIS — E119 Type 2 diabetes mellitus without complications: Secondary | ICD-10-CM | POA: Diagnosis not present

## 2020-12-15 DIAGNOSIS — H2511 Age-related nuclear cataract, right eye: Secondary | ICD-10-CM | POA: Diagnosis not present

## 2020-12-15 DIAGNOSIS — I10 Essential (primary) hypertension: Secondary | ICD-10-CM | POA: Diagnosis not present

## 2021-01-13 ENCOUNTER — Telehealth: Payer: Self-pay | Admitting: *Deleted

## 2021-01-13 NOTE — Chronic Care Management (AMB) (Signed)
  Chronic Care Management   Note  01/13/2021 Name: [name] MRN: [id number] DOB: [birth year]  [name] is a 80 y.o. year old male who is a primary care patient of Noreene Larsson, NP. I reached out to [name] by phone today in response to a referral sent by [name] PCP, Noreene Larsson, NP      [name] was given information about Chronic Care Management services today including:  CCM service includes personalized support from designated clinical staff supervised by his physician, including individualized plan of care and coordination with other care providers 24/7 contact phone numbers for assistance for urgent and routine care needs. Service will only be billed when office clinical staff spend 20 minutes or more in a month to coordinate care. Only one practitioner may furnish and bill the service in a calendar month. The patient may stop CCM services at any time (effective at the end of the month) by phone call to the office staff. The patient will be responsible for cost sharing (co-pay) of up to 20% of the service fee (after annual deductible is met).  Patient agreed to services and verbal consent obtained.   Follow up plan: Telephone appointment with care management team member scheduled for:01/21/21  Jazlynne Milliner  Care Guide, Embedded Care Coordination   Care Management  Direct Dial: [phone]

## 2021-01-20 DIAGNOSIS — N281 Cyst of kidney, acquired: Secondary | ICD-10-CM | POA: Diagnosis not present

## 2021-01-20 DIAGNOSIS — K5730 Diverticulosis of large intestine without perforation or abscess without bleeding: Secondary | ICD-10-CM | POA: Diagnosis not present

## 2021-01-20 DIAGNOSIS — K828 Other specified diseases of gallbladder: Secondary | ICD-10-CM | POA: Diagnosis not present

## 2021-01-20 DIAGNOSIS — D49512 Neoplasm of unspecified behavior of left kidney: Secondary | ICD-10-CM | POA: Diagnosis not present

## 2021-01-20 DIAGNOSIS — K8020 Calculus of gallbladder without cholecystitis without obstruction: Secondary | ICD-10-CM | POA: Diagnosis not present

## 2021-01-21 ENCOUNTER — Ambulatory Visit (INDEPENDENT_AMBULATORY_CARE_PROVIDER_SITE_OTHER): Payer: PPO | Admitting: Pharmacist

## 2021-01-21 VITALS — BP 130/84 | HR 72

## 2021-01-21 DIAGNOSIS — I482 Chronic atrial fibrillation, unspecified: Secondary | ICD-10-CM

## 2021-01-21 DIAGNOSIS — I10 Essential (primary) hypertension: Secondary | ICD-10-CM

## 2021-01-21 DIAGNOSIS — E785 Hyperlipidemia, unspecified: Secondary | ICD-10-CM

## 2021-01-21 DIAGNOSIS — E1165 Type 2 diabetes mellitus with hyperglycemia: Secondary | ICD-10-CM | POA: Diagnosis not present

## 2021-01-21 NOTE — Chronic Care Management (AMB) (Signed)
Chronic Care Management Pharmacy Note  01/21/2021 Name:  [name] MRN:  [id number] DOB:  12-24-1940  Subjective: [name] is an 80 y.o. year old male who is a primary patient of Noreene Larsson, NP.  The CCM team was consulted for assistance with disease management and care coordination needs.    Engaged with patient face to face for initial visit in response to provider referral for pharmacy case management and/or care coordination services.   Consent to Services:  The patient was given the following information about Chronic Care Management services today, agreed to services, and gave verbal consent: 1. CCM service includes personalized support from designated clinical staff supervised by the primary care provider, including individualized plan of care and coordination with other care providers 2. 24/7 contact phone numbers for assistance for urgent and routine care needs. 3. Service will only be billed when office clinical staff spend 20 minutes or more in a month to coordinate care. 4. Only one practitioner may furnish and bill the service in a calendar month. 5.The patient may stop CCM services at any time (effective at the end of the month) by phone call to the office staff. 6. The patient will be responsible for cost sharing (co-pay) of up to 20% of the service fee (after annual deductible is met). Patient agreed to services and consent obtained.  Patient Care Team: Noreene Larsson, NP as PCP - General (Nurse Practitioner) Jerline Pain, MD as PCP - Cardiology (Cardiology) Mal Misty, MD (Inactive) as Attending Physician (Vascular Surgery) Danie Binder, MD (Inactive) as Consulting Physician (Gastroenterology) Eloise Harman, DO as Consulting Physician (Internal Medicine) Beryle Lathe, [facility] (Pharmacist)  Objective:  Lab Results  Component Value Date   CREATININE 1.08 11/17/2020   CREATININE 1.12 11/05/2020   CREATININE 1.19 08/18/2020    Lab  Results  Component Value Date   HGBA1C 6.0 (H) 11/17/2020   Last diabetic Eye exam: No results found for: HMDIABEYEEXA  Last diabetic Foot exam: No results found for: HMDIABFOOTEX      Component Value Date/Time   CHOL 126 11/17/2020 0805   TRIG 70 11/17/2020 0805   HDL 53 11/17/2020 0805   CHOLHDL 4.4 12/08/2012 0705   VLDL 33 12/08/2012 0705   LDLCALC 59 11/17/2020 0805    Hepatic Function Latest Ref Rng & Units 11/17/2020 11/05/2020 08/18/2020  Total Protein 6.0 - 8.5 g/dL 6.6 7.3 6.4  Albumin 3.7 - 4.7 g/dL 4.2 4.0 3.9  AST 0 - 40 IU/L 15 19 15   ALT 0 - 44 IU/L 12 16 13   Alk Phosphatase 44 - 121 IU/L 42(L) 39 41(L)  Total Bilirubin 0.0 - 1.2 mg/dL 0.6 1.1 0.6  Bilirubin, Direct 0.0 - 0.3 mg/dL - - -    Lab Results  Component Value Date/Time   TSH 3.758 06/19/2012 07:15 AM   TSH 2.003 03/31/2012 10:45 AM    CBC Latest Ref Rng & Units 11/17/2020 11/05/2020 08/18/2020  WBC 3.4 - 10.8 x10E3/uL 5.4 10.5 5.0  Hemoglobin 13.0 - 17.7 g/dL 11.6(L) 11.7(L) 10.8(L)  Hematocrit 37.5 - 51.0 % 35.1(L) 36.1(L) 32.7(L)  Platelets 150 - 450 x10E3/uL 226 160 199    No results found for: VD25OH  Clinical ASCVD: No  The ASCVD Risk score Mikey Bussing DC Jr., et al., 2013) failed to calculate for the following reasons:   The valid total cholesterol range is 130 to 320 mg/dL    Social History   Tobacco Use  Smoking Status Former   Packs/day: 1.00   Years: 40.00   Pack years: 40.00   Types: Cigarettes   Quit date: 01/20/2008   Years since quitting: 13.0  Smokeless Tobacco Never   BP Readings from Last 3 Encounters:  01/21/21 130/84  12/09/20 (!) 146/85  12/02/20 134/81   Pulse Readings from Last 3 Encounters:  01/21/21 (!) 141  12/09/20 (!) 58  12/02/20 63   Wt Readings from Last 3 Encounters:  12/09/20 203 lb 6.4 oz (92.3 kg)  12/02/20 204 lb (92.5 kg)  11/24/20 204 lb (92.5 kg)    Assessment: Review of patient past medical history, allergies, medications, health status,  including review of consultants reports, laboratory and other test data, was performed as part of comprehensive evaluation and provision of chronic care management services.   SDOH:  (Social Determinants of Health) assessments and interventions performed:    CCM Care Plan  Allergies  Allergen Reactions   Other Swelling       Substance With 3-hydroxy-3-methylglutaryl-coenzyme A Reductase Inhibitor Mechanism Of Action (substance)      Statins Other (See Comments)    Muscle aches Muscle aches   Rivaroxaban Other (See Comments)    Developed redness in both legs Developed redness in both legs    Medications Reviewed Today     Reviewed by Beryle Lathe, [place] (Pharmacist) on 01/21/21 at 1051  Med List Status: <None>   Medication Order Taking? Sig Documenting Provider Last Dose Status Informant  acetaminophen (TYLENOL) 500 MG tablet [id number] Yes Take 1,000 mg by mouth every 8 (eight) hours as needed (pain). [provider] Taking Active Self  albuterol (VENTOLIN HFA) 108 (90 Base) MCG/ACT inhaler [id number] No Inhale 1-2 puffs into the lungs every 6 (six) hours as needed for wheezing or shortness of breath.  Patient not taking: No sig reported   Emerson Monte, FNP Not Taking Active            Med Note Waldo Laine, Gwenyth Allegra   Wed Jan 21, 2021 10:48 AM) Only needs to use every 2-3 months  ascorbic acid (VITAMIN C) 500 MG tablet [id number] Yes Take 1 tablet by mouth daily. [provider] Taking Active   aspirin EC 81 MG tablet [id number] Yes Take 81 mg by mouth daily. Swallow whole. [provider] Taking Active   blood glucose meter kit and supplies [id number] Yes Dispense based on patient and insurance preference. Use to monitor blood sugar daily. E11.65 Noreene Larsson, NP Taking Active   cetirizine (ZYRTEC) 10 MG tablet [id number] Yes Take 10 mg by mouth at bedtime.  [provider] Taking Active Self  CHLORPHENIRAMINE MALEATE PO [id number]  Yes Take 1 tablet by mouth daily as needed (allergies). [provider] Taking Active Self  cholecalciferol (VITAMIN D3) 25 MCG (1000 UNIT) tablet [id number] Yes Take 2,000 Units by mouth daily. [provider] Taking Active Self  clopidogrel (PLAVIX) 75 MG tablet [id number] Yes Take 1 tablet (75 mg total) by mouth daily at 6 (six) AM. [name], MD Taking Active   diltiazem (CARDIZEM CD) 240 MG 24 hr capsule [id number] Yes Take 1 capsule (240 mg total) by mouth at bedtime. Noreene Larsson, NP Taking Active   fluticasone [facility]) 50 MCG/ACT nasal spray [id number] Yes Place into the nose. [provider] Taking Active   Fluticasone Furoate (ARNUITY ELLIPTA) 50 MCG/ACT AEPB [id number] No   Patient not taking: No sig reported   [provider] Not Taking Active  Lancets (ONETOUCH DELICA PLUS [id number]) [place] [id number] Yes USE TO TEST BLOOD SUGARADAILY AS DIRECTED [provider] Taking Active   LORazepam (ATIVAN) 0.5 MG tablet [id number] Yes Take 1 tablet (0.5 mg total) by mouth at bedtime. Noreene Larsson, NP Taking Active   meclizine (ANTIVERT) 25 MG tablet [id number] Yes Take 25 mg by mouth 2 (two) times daily as needed. For dizziness May take an additional dose at [facility] if needed [provider] Taking Active Self  metFORMIN (GLUCOPHAGE) 500 MG tablet [id number] Yes Take 1 tablet (500 mg total) by mouth daily with breakfast. Noreene Larsson, NP Taking Active   metoprolol succinate (TOPROL-XL) 25 MG 24 hr tablet [id number] Yes Take 0.5 tablets (12.5 mg total) by mouth daily. Noreene Larsson, NP Taking Active   mirabegron ER (MYRBETRIQ) 50 MG TB24 tablet [id number] Yes Take 1 tablet by mouth at bedtime. [provider] Taking Active   Multiple Vitamin (MULTIVITAMIN) tablet [id number] Yes Take 1 tablet by mouth daily. [provider] Taking Active Self  Roma Schanz test strip [id number] Yes USE TO TEST BLOOD SUGARADAILY AS DIRECTED  [provider] Taking Active   pantoprazole (PROTONIX) 40 MG tablet [id number] Yes Take 1 tablet (40 mg total) by mouth 2 (two) times daily. [place], MD Taking Active   rosuvastatin (CRESTOR) 5 MG tablet [id number] Yes Take 5 mg by mouth daily. [provider] Taking Active   tamsulosin (FLOMAX) 0.4 MG CAPS capsule [id number] Yes Take 0.4 mg by mouth in the morning and at bedtime.  [provider] Taking Active Self  triamterene-hydrochlorothiazide (MAXZIDE-25) 37.5-25 MG tablet [id number] Yes Take 1 tablet by mouth daily. 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   CAP (community acquired pneumonia) 04/04/2016   Subdural hematoma ([place]) 02/10/2015   Abdominal aortic aneurysm ([place]) 03/24/2012   Peripheral vascular disease ([place]) 09/20/2011   Hypertension    Hyperlipidemia LDL goal <70    Chronic atrial fibrillation ([place])    Meniere's disease     Immunization History  Administered Date(s) Administered   Moderna Sars-Covid-2 Vaccination 08/23/2019, 09/25/2019   Pneumococcal Conjugate-13 10/02/2016   Pneumococcal Polysaccharide-23 09/27/2017   Td 11/24/1998   Tdap 12/14/2016    Conditions to be addressed/monitored: Atrial Fibrillation, HTN, HLD, and DMII  Care Plan : Medication Management  Updates made by Beryle Lathe, [place] since 01/21/2021 12:00 AM      Problem: Diabetes, Hypertension, Hyperlipidemia, and Atrial fibrillation   Priority: High  Onset Date: 01/21/2021     Long-Range Goal: Disease Progression Prevention   Start Date: 01/21/2021  Expected End Date: 04/21/2021  This Visit's Progress: On track  Priority: High  Note:   Current Barriers:  Unable to achieve control of hypertension   Pharmacist Clinical Goal(s):  Over the next 90  days, patient will Achieve control of hypertension  as evidenced by improved blood pressure control through  collaboration with PharmD and provider.   Interventions: 1:1 collaboration with Noreene Larsson, NP regarding development and update of comprehensive plan of care as evidenced by provider attestation and co-signature Inter-disciplinary care team collaboration (see longitudinal plan of care) Comprehensive medication review performed; medication list updated in electronic medical record  Type 2 Diabetes: Current medications: metformin 500 mg by mouth once daily Intolerances: none Taking medications as directed: yes Side effects thought to be attributed to current medication regimen: no Denies hypoglycemic/hyperglycemic symptoms Hypoglycemia prevention: not indicated at this time Current meal patterns: breakfast: eggs, bacon, and oatmeal; lunch: sandwich and chips or soup; dinner: baked/grilled chicken, potatoes, vegetables, and spaghetti; snacks: cookies and breakfast bar; drinks: water and coffee with and without sugar; occasional regular soda or sweet tea Current exercise: walking and yard work On a statin: [x]  Yes  []  No    Last microalbumin (11/17/20): 9.3; on an ACEi/ARB: []  Yes  [x]  No    Last eye exam: 1 month ago per patient Last foot exam: completed within last year at vein specialist Pneumonia vaccine: up to date; PCV13 (10/02/16) & PPSV23 (09/27/17) Influenza: patient declines and has not received x8 years Shingrix: patient declines Current glucose readings (over last 4 weeks  12/20/20): fasting blood glucose: 128-153 Controlled; Most recent A1c at goal of <7% per ADA guidelines Introduction to diabetes basic facts Monitoring: target blood sugar range, when to test Continue metformin 500 mg by mouth once daily  Hypertension: Current medications: diltiazem 240 mg by mouth at bedtime, metoprolol succinate 12.5 mg by mouth once daily, and triamterene-HCTZ 37.5-25 mg by mouth once daily Intolerances: none Taking medications as directed: yes Side effects thought to be attributed to current medication regimen: no Denies dizziness, lightheadedness, and blurred vision Home blood pressure readings (01/09/21-01/10/21): 130-140s/80s Blood pressure under good control. Blood pressure is at goal of <130/80 mmHg per 2017 AHA/ACC guidelines. Continue diltiazem 240 mg by mouth at bedtime, metoprolol succinate 12.5 mg by mouth once daily, and triamterene-HCTZ 37.5-25 mg by mouth once daily Encourage dietary sodium restriction/DASH diet Recommend home blood pressure monitoring, to bring results in next visit Discussed need for and importance of continued work on weight loss Discussed need for medication compliance Reviewed risks of hypertension, principles of treatment and consequences of untreated hypertension  Hyperlipidemia: Current medications: rosuvastatin 5 mg by mouth once daily Intolerances: other statin (myalgias) Taking medications as directed: yes Side effects thought to be attributed to current medication regimen: no Controlled; LDL at goal of <70 due to very high risk given diabetes + at least 1 additional major risk factor (advancing age and hypertension) per 2020 AACE/ACE guidelines and TG at goal of <150 per 2020 AACE/ACE guidelines Continue rosuvastatin 5 mg by mouth once daily Discussed need for and importance of continued work on weight loss Discussed need for medication compliance  Permanent Atrial Fibrillation: Controlled Denies palpitations; reports some  shortness of breath on exertion Current rate control: diltiazem 240 mg by mouth at bedtime and metoprolol succinate 12.5 mg by mouth once daily Most recent ECG: atrial fibrillation 11/05/20 Anticoagulation: none; patient has implanted Watchman Device Denies signs and symptoms of bleeding CHADS2VASc score: Age (2 points), Hypertension history (1 point), Vascular Disease history (prior MI, peripheral artery disease, or aortic plaque) (1 point), and Diabetes history (1 point) Home blood pressure: see above Home heart rate: 63-90 Continue diltiazem 240 mg by mouth at bedtime and metoprolol succinate 12.5 mg by mouth once daily Encouraged regular physical activity for general health benefits Recommend  home blood pressure and heart rate monitoring, to bring results in next visit Discussed need for medication compliance  Patient Goals/Self-Care Activities Over the next 90  days, patient will:  Take medications as prescribed Check blood sugar 3-4 times per week at the following times: fasting (at least 8 hours since last food consumption) and whenever patient experiences symptoms of hypo/hyperglycemia, document, and provide at future appointments Check blood pressure at least once daily, document, and provide at future appointments  Follow Up Plan: Telephone follow up appointment with care management team member scheduled for: 05/05/21     Medication Assistance: None required.  Patient affirms current coverage meets needs.  Patient's preferred pharmacy is:  [facility]) - [place], [address] [place] [address] Phone: [phone] Fax: [place], [place] [place] [address] Phone: [phone] Fax: [phone]  Follow Up:  Patient agrees to Care Plan and Follow-up.  Plan: Telephone follow up appointment with care management team member scheduled for:   05/05/21 and Next PCP appointment scheduled for: 05/14/21  Kennon Holter, PharmD Clinical Pharmacist [facility] [phone]

## 2021-01-21 NOTE — Patient Instructions (Signed)
[name],  It was great to talk to you today!  Please call me with any questions or concerns.   Visit Information   PATIENT GOALS:   Goals Addressed             This Visit's Progress    Patient Stated       Patient Goals/Self-Care Activities Over the next 90  days, patient will:  Take medications as prescribed Check blood sugar 3-4 times per week at the following times: fasting (at least 8 hours since last food consumption) and whenever patient experiences symptoms of hypo/hyperglycemia, document, and provide at future appointments Check blood pressure at least once daily, document, and provide at future appointments        Consent to CCM Services: [name] was given information about Chronic Care Management services including:  CCM service includes personalized support from designated clinical staff supervised by his physician, including individualized plan of care and coordination with other care providers 24/7 contact phone numbers for assistance for urgent and routine care needs. Service will only be billed when office clinical staff spend 20 minutes or more in a month to coordinate care. Only one practitioner may furnish and bill the service in a calendar month. The patient may stop CCM services at any time (effective at the end of the month) by phone call to the office staff. The patient will be responsible for cost sharing (co-pay) of up to 20% of the service fee (after annual deductible is met).  Patient agreed to services and verbal consent obtained.   Print copy of patient instructions, educational materials, and care plan provided in person.  [name], PharmD Clinical Pharmacist [facility] [phone]  CLINICAL CARE PLAN: Patient Care Plan: Medication Management     Problem Identified: Diabetes, Hypertension, Hyperlipidemia, and Atrial fibrillation   Priority: High  Onset Date: 01/21/2021     Long-Range Goal: Disease  Progression Prevention   Start Date: 01/21/2021  Expected End Date: 04/21/2021  This Visit's Progress: On track  Priority: High  Note:   Current Barriers:  Unable to achieve control of hypertension   Pharmacist Clinical Goal(s):  Over the next 90  days, patient will Achieve control of hypertension  as evidenced by improved blood pressure control through collaboration with PharmD and provider.   Interventions: 1:1 collaboration with Noreene Larsson, NP regarding development and update of comprehensive plan of care as evidenced by provider attestation and co-signature Inter-disciplinary care team collaboration (see longitudinal plan of care) Comprehensive medication review performed; medication list updated in electronic medical record  Type 2 Diabetes: Current medications: metformin 500 mg by mouth once daily Intolerances: none Taking medications as directed: yes Side effects thought to be attributed to current medication regimen: no Denies hypoglycemic/hyperglycemic symptoms Hypoglycemia prevention: not indicated at this time Current meal patterns: breakfast: eggs, bacon, and oatmeal; lunch: sandwich and chips or soup; dinner: baked/grilled chicken, potatoes, vegetables, and spaghetti; snacks: cookies and breakfast bar; drinks: water and coffee with and without sugar; occasional regular soda or sweet tea Current exercise: walking and yard work On a statin: [x]  Yes  []  No    Last microalbumin (11/17/20): 9.3; on an ACEi/ARB: []  Yes  [x]  No    Last eye exam: 1 month ago per patient Last foot exam: completed within last year at vein specialist Pneumonia vaccine: up to date; PCV13 (10/02/16) & PPSV23 (09/27/17) Influenza: patient declines and has not received x8 years Shingrix: patient declines Current glucose readings (over last 4  weeks 12/20/20): fasting blood glucose: 128-153 Controlled; Most recent A1c at goal of <7% per ADA guidelines Introduction to diabetes basic facts Monitoring:  target blood sugar range, when to test Continue metformin 500 mg by mouth once daily  Hypertension: Current medications: diltiazem 240 mg by mouth at bedtime, metoprolol succinate 12.5 mg by mouth once daily, and triamterene-HCTZ 37.5-25 mg by mouth once daily Intolerances: none Taking medications as directed: yes Side effects thought to be attributed to current medication regimen: no Denies dizziness, lightheadedness, and blurred vision Home blood pressure readings (01/09/21-01/10/21): 130-140s/80s Blood pressure under good control. Blood pressure is at goal of <130/80 mmHg per 2017 AHA/ACC guidelines. Continue diltiazem 240 mg by mouth at bedtime, metoprolol succinate 12.5 mg by mouth once daily, and triamterene-HCTZ 37.5-25 mg by mouth once daily Encourage dietary sodium restriction/DASH diet Recommend home blood pressure monitoring, to bring results in next visit Discussed need for and importance of continued work on weight loss Discussed need for medication compliance Reviewed risks of hypertension, principles of treatment and consequences of untreated hypertension  Hyperlipidemia: Current medications: rosuvastatin 5 mg by mouth once daily Intolerances: other statin (myalgias) Taking medications as directed: yes Side effects thought to be attributed to current medication regimen: no Controlled; LDL at goal of <70 due to very high risk given diabetes + at least 1 additional major risk factor (advancing age and hypertension) per 2020 AACE/ACE guidelines and TG at goal of <150 per 2020 AACE/ACE guidelines Continue rosuvastatin 5 mg by mouth once daily Discussed need for and importance of continued work on weight loss Discussed need for medication compliance  Permanent Atrial Fibrillation: Controlled Denies palpitations; reports some shortness of breath on exertion Current rate control: diltiazem 240 mg by mouth at bedtime and metoprolol succinate 12.5 mg by mouth once daily Most  recent ECG: atrial fibrillation 11/05/20 Anticoagulation: none; patient has implanted Watchman Device Denies signs and symptoms of bleeding CHADS2VASc score: Age (2 points), Hypertension history (1 point), Vascular Disease history (prior MI, peripheral artery disease, or aortic plaque) (1 point), and Diabetes history (1 point) Home blood pressure: see above Home heart rate: 63-90 Continue diltiazem 240 mg by mouth at bedtime and metoprolol succinate 12.5 mg by mouth once daily Encouraged regular physical activity for general health benefits Recommend home blood pressure and heart rate monitoring, to bring results in next visit Discussed need for medication compliance  Patient Goals/Self-Care Activities Over the next 90  days, patient will:  Take medications as prescribed Check blood sugar 3-4 times per week at the following times: fasting (at least 8 hours since last food consumption) and whenever patient experiences symptoms of hypo/hyperglycemia, document, and provide at future appointments Check blood pressure at least once daily, document, and provide at future appointments  Follow Up Plan: Telephone follow up appointment with care management team member scheduled for: 05/05/21

## 2021-01-21 NOTE — Progress Notes (Deleted)
Current Barriers:  Unable to achieve control of hypertension   Pharmacist Clinical Goal(s):  Over the next 90  days, patient will Achieve control of hypertension  as evidenced by improved blood pressure control through collaboration with PharmD and provider.   Interventions: 1:1 collaboration with Noreene Larsson, NP regarding development and update of comprehensive plan of care as evidenced by provider attestation and co-signature Inter-disciplinary care team collaboration (see longitudinal plan of care) Comprehensive medication review performed; medication list updated in electronic medical record  Type 2 Diabetes: Current medications: metformin 500 mg by mouth once daily Intolerances: none Taking medications as directed: {Yes/No:25977} Side effects thought to be attributed to current medication regimen: {Yes/No:25978} {Reports/Denies:25979} hypoglycemic/hyperglycemic symptoms Hypoglycemia prevention: not indicated at this time Current meal patterns: breakfast: {Breakfast Foods:25925}; lunch: {Lunch/Dinner Foods:25926}; dinner: {Lunch/Dinner Foods:25926}; snacks: {Snack Foods:25927}; drinks: {Beverages:25928} Current exercise: {Exercise:23478} On a statin: '[x]'$  Yes  '[]'$  No    Last microalbumin (11/17/20): 9.3; on an ACEi/ARB: '[]'$  Yes  '[x]'$  No    Last eye exam: *** Last foot exam: *** Pneumonia vaccine: up to date; PCV13 (10/02/16) & PPSV23 (09/27/17) Current glucose readings: fasting blood glucose: {Fasting BG:25993}, pre-prandial blood glucose: {Pre-prandial BG:25994}, post prandial glucose: {Post-prandial BG:25995} Controlled; Most recent A1c at goal of <7% per ADA guidelines Introduction to diabetes basic facts Monitoring: target blood sugar range, when to test Continue metformin 500 mg by mouth once daily  Hypertension: Current medications: diltiazem 240 mg by mouth at bedtime, metoprolol succinate 12.5 mg by mouth once daily, and triamterene-HCTZ 37.5-25 mg by mouth once  daily Intolerances: {None/Unknown:25976} Taking medications as directed: {Yes/No:25977} Side effects thought to be attributed to current medication regimen: {Yes/No:25978} {Reports/Denies:25979} dizziness, lightheadedness, blurred vision, and headache Home blood pressure readings: *** {HTN Control:26062} Blood pressure is at goal of <130/80 mmHg per 2017 AHA/ACC guidelines. Continue diltiazem 240 mg by mouth at bedtime, metoprolol succinate 12.5 mg by mouth once daily, and triamterene-HCTZ 37.5-25 mg by mouth once daily Encourage dietary sodium restriction/DASH diet Recommend home blood pressure monitoring, to bring results in next visit Discussed need for and importance of continued work on weight loss Discussed need for medication compliance Reviewed risks of hypertension, principles of treatment and consequences of untreated hypertension  Hyperlipidemia: Current medications: rosuvastatin 5 mg by mouth once daily Intolerances: {None/Unknown:25976} Taking medications as directed: {Yes/No:25977} Side effects thought to be attributed to current medication regimen: {Yes/No:25978} Controlled; LDL at goal of <70 due to very high risk given diabetes + at least 1 additional major risk factor (advancing age and hypertension) per 2020 AACE/ACE guidelines and TG at goal of <150 per 2020 AACE/ACE guidelines Continue rosuvastatin 5 mg by mouth once daily Discussed need for and importance of continued work on weight loss Discussed need for medication compliance  Permanent Atrial Fibrillation: Controlled {Reports/Denies:25979} {Afib Symptoms:26171} Current rate control: diltiazem 240 mg by mouth at bedtime and metoprolol succinate 12.5 mg by mouth once daily Most recent ECG: atrial fibrillation 11/05/20 *** Anticoagulation:  none; patient has implanted Watchman Device Denies signs and symptoms of bleeding CHADS2VASc score: Age (2 points), Hypertension history (1 point), Vascular Disease history  (prior MI, peripheral artery disease, or aortic plaque) (1 point), and Diabetes history (1 point) Home blood pressure: *** Home heart rate: *** Continue diltiazem 240 mg by mouth at bedtime and metoprolol succinate 12.5 mg by mouth once daily Encouraged regular physical activity for general health benefits Recommend home blood pressure and heart rate monitoring, to bring results in next visit Discussed need for  medication compliance  Patient Goals/Self-Care Activities Over the next 90  days, patient will:  Take medications as prescribed Check blood sugar {BG testing frequency:26058} at the following times: {BG Monitoring Times:26059}, document, and provide at future appointments Check blood pressure at least once daily, document, and provide at future appointments  Follow Up Plan: {CW CM Follow Up PJ:[id number]

## 2021-01-23 NOTE — Progress Notes (Signed)
Cardiology Office Note:    Date:  01/27/2021   ID:  [name], DOB Dec 01, 1940, MRN [id number]  PCP:  Noreene Larsson, NP   [place]  Cardiologist:  Candee Furbish, MD  Advanced Practice Provider:  No care team member to display Electrophysiologist:  None       Referring MD: Noreene Larsson, NP     History of Present Illness:    [name] is a 80 y.o. male here for follow-up of CABG, atrial fibrillation, and hypertension.  EVAR Stress test 2021-low risk no ischemia.  Doing well.  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  Usually works about 2 hours a day then stops.  No anginal symptoms.  No bleeding.  No syncope.   He denies any palpitations, chest pain, or shortness of breath. No lightheadedness, headaches, syncope, orthopnea, or PND. Also has no lower extremity edema or exertional symptoms.   Past Medical History:  Diagnosis Date   A-fib [facility])    surgerical procedure repair.   AAA (abdominal aortic aneurysm) ([place])    AKI (acute kidney injury) ([place]) 04/04/2016   Aneurysm ([place])    Arthritis    to lower back   Chest pain 07/17/2011   Not clearly of cardiac origin.  Will rx empirically with a PPI and assess response.  Admitted to [facility] in 06/2011; echo-moderate LVH, normal EF, mild aortic root dilatation; stress nuclear-normal perfusion, normal EF.    Colon polyps    COPD (chronic obstructive pulmonary disease) ([place])    COVID-19    Degenerative joint disease    had disc fusion in the past   Diabetes mellitus without complication (HCC)    Hearing deficit, bilateral    left worse than right   History of blood transfusion 12/2007   "bleeding for Warfarin"   Hyperlipidemia    Hypertension    Admitted for chest pain and 06/2011-neg stress Myoview,mod. 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 s/p Watchman   Pneumonia    Renal hemorrhage, right 2009   2009 while anticoagulated with Coumadin; treated with the renal artery embolization   Renal hemorrhage, right    2009 while anticoagulated with Coumadin; treated with the renal artery embolization    [place]is [place] 04/04/2016    Past Surgical History:  Procedure Laterality Date   ABDOMINAL AORTIC ENDOVASCULAR FENESTRATED STENT GRAFT N/A 05/08/2020   Procedure: ABDOMINAL AORTIC ENDOVASCULAR FENESTRATED STENT GRAFT;  Surgeon: CMarty Heck MD;  [place]  Service: Vascular;  Laterality: N/A;   BIOPSY  02/21/2017   Procedure: BIOPSY;  Surgeon: FDanie Binder MD;  [facility];  Service: Endoscopy;;  gastric   COLONOSCOPY N/A 02/21/2017   multiple polyps, one requiring piecemeal removal with epi and clip placement, multiple small and large mouthed diverticula, recto-sigmoid and sigmoid with significant excess looping, internal hemorrhoids. Path with one advanced adenoma, 3 simple adenomas, 2 hyperplastic polyps. Survelilance 3 years    EMBOLIZATION  12/2007   Left renal hematoma, status post angiogram and embolization/notes 09/29/2010   ESOPHAGOGASTRODUODENOSCOPY N/A 02/21/2017   benign-appearing esophageal stenosis s/p dilation, gastritis, scarred mucosa in pylorus due to prior PUD. reactive gastropathy on path   FRACTURE SURGERY  LEFT ATRIAL APPENDAGE OCCLUSION N/A 03/18/2016   Procedure: LEFT ATRIAL APPENDAGE OCCLUSION;  Surgeon: [name], MD;  Location: [place] [facility];  Service: Cardiovascular;  Laterality: N/A;   ORIF FEMUR FRACTURE Right 1960   "2 steel rods in leg"   PATCH ANGIOPLASTY Left 05/08/2020   Procedure: PATCH ANGIOPLASTY LEFT FEMORAL ARTERY;  Surgeon: [name], MD;  Location: [place];  Service: Vascular;  Laterality: Left;   PILONIDAL CYST EXCISION     POLYPECTOMY  02/21/2017   Procedure: POLYPECTOMY;  Surgeon:  Danie Binder, MD;  [facility];  Service: Endoscopy;;  colon   REPAIR ILIAC ARTERY Left 05/08/2020   Procedure: EXPLORATION AND REPAIR LEFT FEMORAL ARTERY;  Surgeon: [name], MD;  Location: [place];  Service: Vascular;  Laterality: Left;   SAVORY DILATION N/A 02/21/2017   Procedure: SAVORY DILATION;  Surgeon: Danie Binder, MD;  [facility];  Service: Endoscopy;  Laterality: N/A;   TEE WITHOUT CARDIOVERSION N/A 03/10/2016   Procedure: TRANSESOPHAGEAL ECHOCARDIOGRAM (TEE);  Surgeon: Josue Hector, MD;  Location: [facility] ENDOSCOPY;  Service: Cardiovascular;  Laterality: N/A;   TEE WITHOUT CARDIOVERSION N/A 04/30/2016   Procedure: TRANSESOPHAGEAL ECHOCARDIOGRAM (TEE);  Surgeon: Josue Hector, MD;  Location: [facility] ENDOSCOPY;  Service: Cardiovascular;  Laterality: N/A;   ULTRASOUND GUIDANCE FOR VASCULAR ACCESS Bilateral 05/08/2020   Procedure: ULTRASOUND GUIDANCE FOR VASCULAR ACCESS, bilateral femoral arteries;  Surgeon: [name], MD;  [facility];  Service: Vascular;  Laterality: Bilateral;   Watchman procedure  02/2016    Current Medications: Current Meds  Medication Sig   acetaminophen (TYLENOL) 500 MG tablet Take 1,000 mg by mouth every 8 (eight) hours as needed (pain).   albuterol (VENTOLIN HFA) 108 (90 Base) MCG/ACT inhaler Inhale 1-2 puffs into the lungs every 6 (six) hours as needed for wheezing or shortness of breath.   ascorbic acid (VITAMIN C) 500 MG tablet Take 1 tablet by mouth daily.   aspirin EC 81 MG tablet Take 81 mg by mouth daily. Swallow whole.   blood glucose meter kit and supplies Dispense based on patient and insurance preference. Use to monitor blood sugar daily. E11.65   cetirizine (ZYRTEC) 10 MG tablet Take 10 mg by mouth at bedtime.    CHLORPHENIRAMINE MALEATE PO Take 1 tablet by mouth daily as needed (allergies).   cholecalciferol (VITAMIN D3) 25 MCG (1000 UNIT) tablet Take 2,000 Units by mouth daily.   clopidogrel (PLAVIX)  75 MG tablet Take 1 tablet (75 mg total) by mouth daily at 6 (six) AM.   diltiazem (CARDIZEM CD) 240 MG 24 hr capsule Take 1 capsule (240 mg total) by mouth at bedtime.   fluticasone (FLONASE) 50 MCG/ACT nasal spray Place into the nose.   Lancets (ONETOUCH DELICA PLUS [id number]) MISC USE TO TEST BLOOD SUGARADAILY AS DIRECTED   LORazepam (ATIVAN) 0.5 MG tablet Take 1 tablet (0.5 mg total) by mouth at bedtime.   meclizine (ANTIVERT) 25 MG tablet Take 25 mg by mouth 2 (two) times daily as needed. For dizziness May take an additional dose at [facility] if needed   metFORMIN (GLUCOPHAGE) 500 MG tablet Take 1 tablet (500 mg total) by mouth daily with breakfast.   metoprolol succinate (TOPROL-XL) 25 MG 24 hr tablet Take 0.5 tablets (12.5 mg total) by mouth daily.   mirabegron ER (MYRBETRIQ) 50 MG TB24 tablet Take 1 tablet by mouth at bedtime.   Multiple Vitamin (MULTIVITAMIN) tablet Take 1 tablet by mouth daily.  ONETOUCH VERIO test strip USE TO TEST BLOOD SUGARADAILY AS DIRECTED   pantoprazole (PROTONIX) 40 MG tablet Take 1 tablet (40 mg total) by mouth 2 (two) times daily.   rosuvastatin (CRESTOR) 5 MG tablet Take 5 mg by mouth daily.   tamsulosin (FLOMAX) 0.4 MG CAPS capsule Take 0.4 mg by mouth in the morning and at bedtime.    triamterene-hydrochlorothiazide (MAXZIDE-25) 37.5-25 MG tablet Take 1 tablet by mouth daily.     Allergies:   Other, Statins, and Rivaroxaban   Social History   Socioeconomic History   Marital status: Married    Spouse name: Not on file   Number of children: 2   Years of education: Not on file   Highest education level: Not on file  Occupational History   Occupation: Retired    Comment: Chief Technology Officer job  Tobacco Use   Smoking status: Former    Packs/day: 1.00    Years: 40.00    Pack years: 40.00    Types: Cigarettes    Quit date: 01/20/2008    Years since quitting: 13.0   Smokeless tobacco: Never  Vaping Use   Vaping Use: Never used  Substance  and Sexual Activity   Alcohol use: Not Currently    Alcohol/week: 1.0 standard drink    Types: 1 Standard drinks or equivalent per week    Comment: rare wine   Drug use: No   Sexual activity: Yes  Other Topics Concern   Not on file  Social History Narrative   Retired from Architect   Lives in [place] Strain: Low Risk    Difficulty of Paying Living Expenses: Not hard at all  Food Insecurity: No Food Insecurity   Worried About Charity fundraiser in the Last Year: Never true   Arboriculturist in the Last Year: Never true  Transportation Needs: No Transportation Needs   Lack of Transportation (Medical): No   Lack of Transportation (Non-Medical): No  Physical Activity: Insufficiently Active   Days of Exercise per Week: 2 days   Minutes of Exercise per Session: 40 min  Stress: No Stress Concern Present   Feeling of Stress : Not at all  Social Connections: Socially Integrated   Frequency of Communication with Friends and Family: More than three times a week   Frequency of Social Gatherings with Friends and Family: Twice a week   Attends Religious Services: More than 4 times per year   Active Member of [employer] or Organizations: Yes   Attends Music therapist: More than 4 times per year   Marital Status: Married     Family History: The patient's family history includes Atrial fibrillation in his sister; Colon cancer in his cousin; Dementia in his father; Heart disease in his brother; Hyperlipidemia in his mother; Hypertension in his mother; Renal cancer in his mother; Stroke in his father. There is no history of Colon polyps, Esophageal cancer, Rectal cancer, or Stomach cancer.  ROS:   Please see the history of present illness.    (+) All other systems reviewed and are negative.  EKGs/Labs/Other Studies Reviewed:    US Renal Artery Duplex 12/09/2020: Summary:  Renal:     Right: Normal size right kidney. No  evidence of right renal artery         stenosis.  Left:  Normal size of left kidney. No evidence of left renal artery  stenosis.   [place] EVAR Duplex 12/09/2020: Summary:  Abdominal Aorta: Patent endovascular aneurysm repair with no evidence of endoleak. Although aorta size has increased since last exam.  CT Angiography Chest/Abd/Pel 05/29/2020: IMPRESSION: Chest CTA Impression:   1. 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 Recommend annual imaging followup by CTA or MRA. This recommendation follows 2010 ACCF/AHA/AATS/ACR/ASA/SCA/SCAI/SIR/STS/SVM Guidelines for the Diagnosis and Management of Patients with Thoracic Aortic Disease. Circulation. 2010; 121: U202-R427. Aortic aneurysm NOS (ICD10-I71.9) 2. Sequela of previous left atrial appendage occlusion. 3. Coronary artery calcifications. Aortic Atherosclerosis (ICD10-I70.0). 4. Indeterminate punctate (3 mm) noncalcified right upper lobe pulmonary nodule. Continued attention on follow-up is advised. 5. Emphysema (ICD10-J43.9).   Abdomen and Pelvis CTA Impression:   1. Post endovascular repair of infrarenal abdominal aortic aneurysm without evidence of complication. Specifically, no evidence of an endoleak. Stable size of the native abdominal aortic aneurysm sac measuring 6.0 cm in maximal diameter. Aortic aneurysm NOS (ICD10-I71.9). 2. Post stenting of the bilateral renal arteries without evidence of a hemodynamically significant stenosis. 3. Moderate to large amount of eccentric noncalcified atherosclerotic plaque involving the distal aspect of the main trunk of the SMA which approaches 50% luminal narrowing, however the distal tributaries of the SMA appear widely patent without discrete lumen filling defect to suggest distal embolism. 4. 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 5.  Chronic abnormal appearance of the gallbladder favored to represent sequela of non operative acute cholecystitis demonstrated on the 2017 examination. 6. Unchanged appearance of previously questioned 1.5 cm exophytic lesion involving the anterior aspect of the stomach, nonspecific though potentially represent of a GIST. 7. Grossly unchanged approximately 1.2 cm hypoattenuating though potentially enhancing left-sided renal lesion - further evaluation with abdominal MRI could performed as indicated.  Lexiscan Myoview 03/10/2020: Nuclear stress EF: 62%. No T wave inversion was noted during stress. There was no ST segment deviation noted during stress. Defect 1: There is a small defect of mild severity. This is a low risk study.   Small size, mild severity mostly fixed mid-anteroseptal perfusion defect, likely artifact. No significant reversible ischemia. LVEF 62% with normal wall motion. This is a low risk study.  Echo 03/10/2020:  1. Left ventricular ejection fraction, by estimation, is 65 to 70%. The  left ventricle has normal function. The left ventricle has no regional  wall motion abnormalities. There is severe asymmetric left ventricular  hypertrophy of the basal-septal  segment. Left ventricular diastolic parameters are indeterminate.   2. Right ventricular systolic function is normal. The right ventricular  size is mildly enlarged. There is normal pulmonary artery systolic  pressure. The estimated right ventricular systolic pressure is 06.2 mmHg.   3. Left atrial size was severely dilated.   4. Right atrial size was severely dilated.   5. The mitral valve is normal in structure. Mild mitral valve  regurgitation.   6. The aortic valve is tricuspid. Aortic valve regurgitation is mild.  Mild to moderate aortic valve sclerosis/calcification is present, without  any evidence of aortic stenosis.   7. Aortic dilatation noted. There is dilatation of the ascending aorta,  measuring 45  mm.   8. The inferior vena cava is normal in size with greater than 50%  respiratory variability, suggesting right atrial pressure of 3 mmHg.   EKG:  EKG is personally reviewed and interpreted. 01/27/2021:  Recent Labs: 05/08/2020: Magnesium 1.6 11/17/2020: ALT 12; BUN 13; Creatinine, Ser  1.08; Hemoglobin 11.6; Platelets 226; Potassium 3.9; Sodium 140  Recent Lipid Panel    Component Value Date/Time   CHOL 126 11/17/2020 0805   TRIG 70 11/17/2020 0805   HDL 53 11/17/2020 0805   CHOLHDL 4.4 12/08/2012 0705   VLDL 33 12/08/2012 0705   LDLCALC 59 11/17/2020 0805     Risk Assessment/Calculations:      Physical Exam:     VS:  BP 130/80   Pulse 62   Ht 5' 7"  (1.702 m)   Wt 208 lb 3.2 oz (94.4 kg)   SpO2 97%   BMI 32.61 kg/m     Wt Readings from Last 3 Encounters:  01/27/21 208 lb 3.2 oz (94.4 kg)  12/09/20 203 lb 6.4 oz (92.3 kg)  12/02/20 204 lb (92.5 kg)     GEN: Well nourished, well developed in no acute distress HEENT: Normal NECK: No JVD; No carotid bruits LYMPHATICS: No lymphadenopathy CARDIAC: Irregular irreg, no murmurs, rubs, gallops RESPIRATORY:  Clear to auscultation without rales, wheezing or rhonchi  ABDOMEN: Soft, non-tender, non-distended MUSCULOSKELETAL:  No edema; No deformity  SKIN: Warm and dry NEUROLOGIC:  Alert and oriented x 3 PSYCHIATRIC:  Normal affect    ASSESSMENT:    1. Permanent atrial fibrillation (HCC)   2. Abdominal aortic aneurysm (AAA) without rupture ([place])   3. Hyperlipidemia LDL goal <70   4. Essential (primary) hypertension    PLAN:    In order of problems listed above: Permanent atrial fibrillation ([place]) Doing well, well rate controlled.  On Cardizem CD 240 mg a day as well as low-dose metoprolol XL 12.5 mg twice a day.  No changes made to medical management.  Also has watchman device in place.  Excellent.  AAA (abdominal aortic aneurysm) (HCC) Dr. Carlis Abbott, EVAR.  Doing well.  On aspirin and Plavix.  No bleeding.   Recently saw him in July.  AAA excellent.  Hyperlipidemia LDL goal <70 Overall doing well with low-dose Crestor 5 mg.  Last LDL 59.  No myalgias.  ALT from outside labs 12.  Essential (primary) hypertension Multiple drug regimen.  Continue with diltiazem, metoprolol, triamterene hydrochlorothiazide  Follow-up: 12 months  Medication Adjustments/Labs and Tests Ordered: Current medicines are reviewed at length with the patient today.  Concerns regarding medicines are outlined above.  No orders of the defined types were placed in this encounter.  No orders of the defined types were placed in this encounter.   Patient Instructions  Medication Instructions:  The current medical regimen is effective;  continue present plan and medications.  *If you need a refill on your cardiac medications before your next appointment, please call your pharmacy*  Follow-Up: At [facility], you and your health needs are our priority.  As part of our continuing mission to provide you with exceptional heart care, we have created designated Provider Care Teams.  These Care Teams include your primary Cardiologist (physician) and Advanced Practice Providers (APPs -  Physician Assistants and Nurse Practitioners) who all work together to provide you with the care you need, when you need it.  We recommend signing up for the patient portal called "MyChart".  Sign up information is provided on this After Visit Summary.  MyChart is used to connect with patients for Virtual Visits (Telemedicine).  Patients are able to view lab/test results, encounter notes, upcoming appointments, etc.  Non-urgent messages can be sent to your provider as well.   To learn more about what you can do with MyChart, go to [email or web address].  Your next appointment:   1 year(s)  The format for your next appointment:   In Person  Provider:   Candee Furbish, MD  Thank you for choosing [facility]!!       Signed, Candee Furbish, MD  01/27/2021 2:22 PM    [place]

## 2021-01-26 DIAGNOSIS — D49512 Neoplasm of unspecified behavior of left kidney: Secondary | ICD-10-CM | POA: Diagnosis not present

## 2021-01-26 DIAGNOSIS — N401 Enlarged prostate with lower urinary tract symptoms: Secondary | ICD-10-CM | POA: Diagnosis not present

## 2021-01-26 DIAGNOSIS — R3915 Urgency of urination: Secondary | ICD-10-CM | POA: Diagnosis not present

## 2021-01-27 ENCOUNTER — Encounter: Payer: Self-pay | Admitting: Cardiology

## 2021-01-27 ENCOUNTER — Ambulatory Visit: Payer: PPO | Admitting: Cardiology

## 2021-01-27 ENCOUNTER — Other Ambulatory Visit: Payer: Self-pay

## 2021-01-27 DIAGNOSIS — I4821 Permanent atrial fibrillation: Secondary | ICD-10-CM

## 2021-01-27 DIAGNOSIS — I714 Abdominal aortic aneurysm, without rupture, unspecified: Secondary | ICD-10-CM

## 2021-01-27 DIAGNOSIS — I10 Essential (primary) hypertension: Secondary | ICD-10-CM | POA: Diagnosis not present

## 2021-01-27 DIAGNOSIS — E785 Hyperlipidemia, unspecified: Secondary | ICD-10-CM

## 2021-01-27 NOTE — Assessment & Plan Note (Signed)
Multiple drug regimen.  Continue with diltiazem, metoprolol, triamterene hydrochlorothiazide

## 2021-01-27 NOTE — Assessment & Plan Note (Signed)
Dr. Carlis Abbott, EVAR.  Doing well.  On aspirin and Plavix.  No bleeding.  Recently saw him in July.  AAA excellent.

## 2021-01-27 NOTE — Assessment & Plan Note (Signed)
Doing well, well rate controlled.  On Cardizem CD 240 mg a day as well as low-dose metoprolol XL 12.5 mg twice a day.  No changes made to medical management.  Also has watchman device in place.  Excellent.

## 2021-01-27 NOTE — Assessment & Plan Note (Signed)
Overall doing well with low-dose Crestor 5 mg.  Last LDL 59.  No myalgias.  ALT from outside labs 12.

## 2021-01-27 NOTE — Patient Instructions (Signed)
Medication Instructions:  The current medical regimen is effective;  continue present plan and medications.  *If you need a refill on your cardiac medications before your next appointment, please call your pharmacy*  Follow-Up: At [facility], you and your health needs are our priority.  As part of our continuing mission to provide you with exceptional heart care, we have created designated Provider Care Teams.  These Care Teams include your primary Cardiologist (physician) and Advanced Practice Providers (APPs -  Physician Assistants and Nurse Practitioners) who all work together to provide you with the care you need, when you need it.  We recommend signing up for the patient portal called "MyChart".  Sign up information is provided on this After Visit Summary.  MyChart is used to connect with patients for Virtual Visits (Telemedicine).  Patients are able to view lab/test results, encounter notes, upcoming appointments, etc.  Non-urgent messages can be sent to your provider as well.   To learn more about what you can do with MyChart, go to [email or web address].com.    Your next appointment:   1 year(s)  The format for your next appointment:   In Person  Provider:   Mark Skains, MD   Thank you for choosing [facility]!!    

## 2021-02-10 ENCOUNTER — Telehealth: Payer: Self-pay

## 2021-02-10 ENCOUNTER — Other Ambulatory Visit: Payer: Self-pay

## 2021-02-10 DIAGNOSIS — E785 Hyperlipidemia, unspecified: Secondary | ICD-10-CM

## 2021-02-10 DIAGNOSIS — I10 Essential (primary) hypertension: Secondary | ICD-10-CM

## 2021-02-10 DIAGNOSIS — Z1283 Encounter for screening for malignant neoplasm of skin: Secondary | ICD-10-CM | POA: Diagnosis not present

## 2021-02-10 DIAGNOSIS — F419 Anxiety disorder, unspecified: Secondary | ICD-10-CM

## 2021-02-10 DIAGNOSIS — I482 Chronic atrial fibrillation, unspecified: Secondary | ICD-10-CM

## 2021-02-10 DIAGNOSIS — Z85828 Personal history of other malignant neoplasm of skin: Secondary | ICD-10-CM | POA: Diagnosis not present

## 2021-02-10 DIAGNOSIS — Z08 Encounter for follow-up examination after completed treatment for malignant neoplasm: Secondary | ICD-10-CM | POA: Diagnosis not present

## 2021-02-10 MED ORDER — TRIAMTERENE-HCTZ 37.5-25 MG PO TABS: 1.0000 | ORAL_TABLET | Freq: Every day | ORAL | 1 refills | Status: DC

## 2021-02-10 MED ORDER — LORAZEPAM 0.5 MG PO TABS: 0.5000 mg | ORAL_TABLET | Freq: Every day | ORAL | 1 refills | Status: DC

## 2021-02-10 MED ORDER — ROSUVASTATIN CALCIUM 5 MG PO TABS: 5.0000 mg | ORAL_TABLET | Freq: Every day | ORAL | 1 refills | Status: DC

## 2021-02-10 MED ORDER — DILTIAZEM HCL ER COATED BEADS 240 MG PO CP24: 240.0000 mg | ORAL_CAPSULE | Freq: Every day | ORAL | 1 refills | Status: DC

## 2021-02-10 NOTE — Telephone Encounter (Signed)
Rx's refilled, pt informed.

## 2021-02-10 NOTE — Telephone Encounter (Signed)
Patient needs refills on the following  Diltiazem '240mg'$  Triamterene 37.5-25 mg Rosuvastatin '5mg'$  Lorazepam  Patient uses [facility] ph# [phone] please let patient know when these have been called in

## 2021-02-25 ENCOUNTER — Other Ambulatory Visit: Payer: Self-pay

## 2021-02-25 DIAGNOSIS — R7301 Impaired fasting glucose: Secondary | ICD-10-CM

## 2021-02-25 MED ORDER — METFORMIN HCL 500 MG PO TABS: 500.0000 mg | ORAL_TABLET | Freq: Every day | ORAL | 1 refills | Status: AC

## 2021-05-05 ENCOUNTER — Telehealth: Payer: PPO

## 2021-05-12 ENCOUNTER — Other Ambulatory Visit: Payer: Self-pay

## 2021-05-12 ENCOUNTER — Ambulatory Visit: Payer: PPO | Admitting: Pharmacist

## 2021-05-12 DIAGNOSIS — I482 Chronic atrial fibrillation, unspecified: Secondary | ICD-10-CM

## 2021-05-12 DIAGNOSIS — K2970 Gastritis, unspecified, without bleeding: Secondary | ICD-10-CM

## 2021-05-12 DIAGNOSIS — R7301 Impaired fasting glucose: Secondary | ICD-10-CM

## 2021-05-12 DIAGNOSIS — I10 Essential (primary) hypertension: Secondary | ICD-10-CM | POA: Diagnosis not present

## 2021-05-12 DIAGNOSIS — E785 Hyperlipidemia, unspecified: Secondary | ICD-10-CM

## 2021-05-12 DIAGNOSIS — E1165 Type 2 diabetes mellitus with hyperglycemia: Secondary | ICD-10-CM | POA: Diagnosis not present

## 2021-05-12 MED ORDER — PANTOPRAZOLE SODIUM 40 MG PO TBEC: 40.0000 mg | DELAYED_RELEASE_TABLET | Freq: Two times a day (BID) | ORAL | 0 refills | Status: AC

## 2021-05-12 NOTE — Chronic Care Management (AMB) (Signed)
Chronic Care Management Pharmacy Note  05/12/2021 Name:  [name] MRN:  [id number] DOB:  December 03, 1940  Summary: Hypertension: Current medications: diltiazem 240 mg by mouth at bedtime, metoprolol succinate 12.5 mg by mouth once daily, and triamterene-HCTZ 37.5-25 mg by mouth once daily Home blood pressure readings (patient reports blood pressure cuff is 78 years old): 140-150s/80s (taken before blood pressure medications) Blood pressure under good control at recent office visits but patient reports blood pressure has been slightly elevated at home recently. Blood pressure goal is <130/80 mmHg per 2017 AHA/ACC guidelines. Continue diltiazem 240 mg by mouth at bedtime, metoprolol succinate 12.5 mg by mouth once daily, and triamterene-HCTZ 37.5-25 mg by mouth once daily Patient instructed to consider bringing in home blood pressure cuff to be validated at next PCP visit. Patient instructed to check home blood pressure 1-2 hours after taking blood pressure medications   General: Will review lab results drawn today and discuss any pertinent results with PCP to be address at next PCP visit.   Subjective: [name] is an 80 y.o. year old male who is a primary patient of Noreene Larsson, NP.  The CCM team was consulted for assistance with disease management and care coordination needs.    Engaged with patient by telephone for follow up visit in response to provider referral for pharmacy case management and/or care coordination services.   Consent to Services:  The patient was given information about Chronic Care Management services, agreed to services, and gave verbal consent prior to initiation of services.  Please see initial visit note for detailed documentation.   Patient Care Team: Noreene Larsson, NP as PCP - General (Nurse Practitioner) Jerline Pain, MD as PCP - Cardiology (Cardiology) Mal Misty, MD (Inactive) as Attending Physician (Vascular Surgery) Danie Binder,  MD (Inactive) as Consulting Physician (Gastroenterology) Eloise Harman, DO as Consulting Physician (Internal Medicine) Beryle Lathe, [facility] (Pharmacist)  Objective:  Lab Results  Component Value Date   CREATININE 1.08 11/17/2020   CREATININE 1.12 11/05/2020   CREATININE 1.19 08/18/2020    Lab Results  Component Value Date   HGBA1C 6.0 (H) 11/17/2020   Last diabetic Eye exam: No results found for: HMDIABEYEEXA  Last diabetic Foot exam: No results found for: HMDIABFOOTEX      Component Value Date/Time   CHOL 126 11/17/2020 0805   TRIG 70 11/17/2020 0805   HDL 53 11/17/2020 0805   CHOLHDL 4.4 12/08/2012 0705   VLDL 33 12/08/2012 0705   LDLCALC 59 11/17/2020 0805    Hepatic Function Latest Ref Rng & Units 11/17/2020 11/05/2020 08/18/2020  Total Protein 6.0 - 8.5 g/dL 6.6 7.3 6.4  Albumin 3.7 - 4.7 g/dL 4.2 4.0 3.9  AST 0 - 40 IU/L 15 19 15   ALT 0 - 44 IU/L 12 16 13   Alk Phosphatase 44 - 121 IU/L 42(L) 39 41(L)  Total Bilirubin 0.0 - 1.2 mg/dL 0.6 1.1 0.6  Bilirubin, Direct 0.0 - 0.3 mg/dL - - -    Lab Results  Component Value Date/Time   TSH 3.758 06/19/2012 07:15 AM   TSH 2.003 03/31/2012 10:45 AM    CBC Latest Ref Rng & Units 11/17/2020 11/05/2020 08/18/2020  WBC 3.4 - 10.8 x10E3/uL 5.4 10.5 5.0  Hemoglobin 13.0 - 17.7 g/dL 11.6(L) 11.7(L) 10.8(L)  Hematocrit 37.5 - 51.0 % 35.1(L) 36.1(L) 32.7(L)  Platelets 150 - 450 x10E3/uL 226 160 199    No results found for: VD25OH  Clinical ASCVD:  No  The ASCVD Risk score (Arnett DK, et al., 2019) failed to calculate for the following reasons:   The valid total cholesterol range is 130 to 320 mg/dL    Social History   Tobacco Use  Smoking Status Former   Packs/day: 1.00   Years: 40.00   Pack years: 40.00   Types: Cigarettes   Quit date: 01/20/2008   Years since quitting: 13.3  Smokeless Tobacco Never   BP Readings from Last 3 Encounters:  01/27/21 130/80  01/21/21 130/84  12/09/20 (!) 146/85   Pulse  Readings from Last 3 Encounters:  01/27/21 62  01/21/21 72  12/09/20 (!) 58   Wt Readings from Last 3 Encounters:  01/27/21 208 lb 3.2 oz (94.4 kg)  12/09/20 203 lb 6.4 oz (92.3 kg)  12/02/20 204 lb (92.5 kg)    Assessment: Review of patient past medical history, allergies, medications, health status, including review of consultants reports, laboratory and other test data, was performed as part of comprehensive evaluation and provision of chronic care management services.   SDOH:  (Social Determinants of Health) assessments and interventions performed:    CCM Care Plan  Allergies  Allergen Reactions   Other Swelling       Substance With 3-hydroxy-3-methylglutaryl-coenzyme A Reductase Inhibitor Mechanism Of Action (substance)      Statins Other (See Comments)    Muscle aches Muscle aches   Rivaroxaban Other (See Comments)    Developed redness in both legs Developed redness in both legs    Medications Reviewed Today     Reviewed by Beryle Lathe, [place] (Pharmacist) on 05/12/21 at 1146  Med List Status: <None>   Medication Order Taking? Sig Documenting Provider Last Dose Status Informant  acetaminophen (TYLENOL) 500 MG tablet [id number] Yes Take 1,000 mg by mouth every 8 (eight) hours as needed (pain). [provider] Taking Active Self  albuterol (VENTOLIN HFA) 108 (90 Base) MCG/ACT inhaler [id number] Yes Inhale 1-2 puffs into the lungs every 6 (six) hours as needed for wheezing or shortness of breath. Emerson Monte, FNP Taking Active            Med Note ([place]   Tue Jan 27, 2021  1:47 PM)    aspirin EC 81 MG tablet [id number] Yes Take 81 mg by mouth daily. Swallow whole. [provider] Taking Active   blood glucose meter kit and supplies [id number]  Dispense based on patient and insurance preference. Use to monitor blood sugar daily. E11.65 Noreene Larsson, NP  Active   cetirizine (ZYRTEC) 10 MG tablet [id number] Yes Take 10 mg by mouth  at bedtime.  [provider] Taking Active Self  cholecalciferol (VITAMIN D3) 25 MCG (1000 UNIT) tablet [id number] Yes Take 2,000 Units by mouth daily. [provider] Taking Active Self  clopidogrel (PLAVIX) 75 MG tablet [id number] Yes Take 1 tablet (75 mg total) by mouth daily at 6 (six) AM. [name], MD Taking Active   diltiazem (CARDIZEM CD) 240 MG 24 hr capsule [id number] Yes Take 1 capsule (240 mg total) by mouth at bedtime. Noreene Larsson, NP Taking Active   fluticasone [facility]) 50 MCG/ACT nasal spray [id number] Yes Place 1 spray into both nostrils daily. [provider] Taking Active   Lancets (ONETOUCH DELICA PLUS VVKPQA44L) [place] [id number]  USE TO TEST BLOOD SUGARADAILY AS DIRECTED [provider]  Active   LORazepam (ATIVAN) 0.5 MG tablet [id number] Yes Take 1 tablet (0.5 mg total) by mouth at bedtime.  Noreene Larsson, NP Taking Active   meclizine (ANTIVERT) 25 MG tablet [id number] Yes Take 25 mg by mouth 2 (two) times daily as needed. For dizziness May take an additional dose at [facility] if needed [provider] Taking Active Self  metFORMIN (GLUCOPHAGE) 500 MG tablet [id number] Yes Take 1 tablet (500 mg total) by mouth daily with breakfast. Noreene Larsson, NP Taking Active   metoprolol succinate (TOPROL-XL) 25 MG 24 hr tablet [id number] Yes Take 0.5 tablets (12.5 mg total) by mouth daily. Noreene Larsson, NP Taking Active   mirabegron ER (MYRBETRIQ) 50 MG TB24 tablet [id number] Yes Take 1 tablet by mouth at bedtime. [provider] Taking Active   Multiple Vitamin (MULTIVITAMIN) tablet [id number] Yes Take 1 tablet by mouth daily. [provider] Taking Active Self  Roma Schanz test strip [id number]  USE TO TEST BLOOD SUGARADAILY AS DIRECTED [provider]  Active   pantoprazole (PROTONIX) 40 MG tablet [id number] Yes Take 1 tablet (40 mg total) by mouth 2 (two) times daily. [place] Park, MD Taking Active    rosuvastatin (CRESTOR) 5 MG tablet [id number] Yes Take 1 tablet (5 mg total) by mouth daily. Noreene Larsson, NP Taking Active   tamsulosin [facility]) 0.4 MG CAPS capsule [id number] Yes Take 0.4 mg by mouth in the morning and at bedtime.  [provider] Taking Active Self  triamterene-hydrochlorothiazide (MAXZIDE-25) 37.5-25 MG tablet [id number] Yes Take 1 tablet by mouth daily. Noreene Larsson, NP Taking Active             Patient Active Problem List   Diagnosis Date Noted   Permanent atrial fibrillation ([place]) 01/27/2021   Essential (primary) hypertension 11/14/2020   Body mass index (BMI) 30.0-30.9, adult 11/11/2020   Intracranial aneurysm 11/11/2020   Type II diabetes mellitus, uncontrolled 08/21/2020   Headache 06/25/2020   Encounter to establish care 06/25/2020   Screening due 06/25/2020   AAA (abdominal aortic aneurysm) 05/08/2020   History of colon polyps 06/07/2017   Gastritis    Positive colorectal cancer screening using Cologuard test 01/04/2017   Dysphagia 01/04/2017   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   CAP (community acquired pneumonia) 04/04/2016   Subdural hematoma 02/10/2015   Abdominal aortic aneurysm 03/24/2012   Peripheral vascular disease ([place]) 09/20/2011   Hypertension    Hyperlipidemia LDL goal <70    Meniere's disease     Immunization History  Administered Date(s) Administered   Moderna Sars-Covid-2 Vaccination 08/23/2019, 09/25/2019   Pneumococcal Conjugate-13 10/02/2016   Pneumococcal Polysaccharide-23 09/27/2017   Td 11/24/1998   Tdap 12/14/2016    Conditions to be addressed/monitored: Atrial Fibrillation, HTN, HLD, and DMII  Care Plan : Medication Management  Updates made by Beryle Lathe, [place] since 05/12/2021 12:00 AM     Problem: Diabetes, Hypertension, Hyperlipidemia, and Atrial fibrillation   Priority: High  Onset Date: 01/21/2021     Long-Range Goal: Disease Progression Prevention   Start Date:  01/21/2021  Expected End Date: 04/21/2021  Recent Progress: On track  Priority: High  Note:   Current Barriers:  Unable to achieve control of hypertension   Pharmacist Clinical Goal(s):  Over the next 90 days, patient will Achieve control of hypertension as evidenced by improved blood pressure control through collaboration with PharmD and provider.   Interventions: 1:1 collaboration with Noreene Larsson, NP regarding development and update of comprehensive plan of care as evidenced by provider attestation and co-signature Inter-disciplinary care team collaboration (see longitudinal plan of care) Comprehensive medication  review performed; medication list updated in electronic medical record  Type 2 Diabetes: Current medications: metformin 500 mg by mouth once daily Intolerances: none Taking medications as directed: yes Side effects thought to be attributed to current medication regimen: no Denies hypoglycemic/hyperglycemic symptoms Hypoglycemia prevention: not indicated at this time Current meal patterns: breakfast: eggs, bacon, and oatmeal; lunch: sandwich and chips or soup; dinner: baked/grilled chicken, potatoes, vegetables, and spaghetti; snacks: cookies and breakfast bar; drinks: water and coffee with and without sugar; occasional regular soda or sweet tea Current exercise: walking and yard work On a statin: [x]  Yes  []  No    Last microalbumin (11/17/20): 9.3; on an ACEi/ARB: []  Yes  [x]  No    Last eye exam: July 2022 per patient. Plans to go see eye Dr. again in spring Last foot exam: completed within last year at vein specialist Pneumonia vaccine: up to date; PCV13 (10/02/16) & PPSV23 (09/27/17) Influenza: patient declines and has not received x8 years Shingrix: patient declines Current glucose readings (over last few weeks): fasting blood glucose: 120-140s Controlled; Most recent A1c at goal of <7% per ADA guidelines Continue metformin 500 mg by mouth once  daily  Hypertension: Current medications: diltiazem 240 mg by mouth at bedtime, metoprolol succinate 12.5 mg by mouth once daily, and triamterene-HCTZ 37.5-25 mg by mouth once daily Intolerances: none Taking medications as directed: yes Side effects thought to be attributed to current medication regimen: no Denies dizziness, lightheadedness, and blurred vision Home blood pressure readings (patient reports blood pressure cuff is 78 years old): 140-150s/80s (taken before blood pressure medications) Blood pressure under good control at recent office visits but patient reports blood pressure has been slightly elevated at home recently. Blood pressure goal is <130/80 mmHg per 2017 AHA/ACC guidelines. Continue diltiazem 240 mg by mouth at bedtime, metoprolol succinate 12.5 mg by mouth once daily, and triamterene-HCTZ 37.5-25 mg by mouth once daily Patient instructed to consider bringing in home blood pressure cuff to be validated at next PCP visit. Patient instructed to check home blood pressure 1-2 hours after taking blood pressure medications  Encourage dietary sodium restriction/DASH diet Recommend home blood pressure monitoring, to bring results in next visit Discussed need for and importance of continued work on weight loss Discussed need for medication compliance Reviewed risks of hypertension, principles of treatment and consequences of untreated hypertension  Hyperlipidemia: Current medications: rosuvastatin 5 mg by mouth once daily Intolerances: other unknown statin (myalgias) Taking medications as directed: yes Side effects thought to be attributed to current medication regimen: no Controlled; LDL at goal of <70 due to very high risk given diabetes + at least 1 additional major risk factor (advancing age and hypertension) per 2020 AACE/ACE guidelines and TG at goal of <150 per 2020 AACE/ACE guidelines Continue rosuvastatin 5 mg by mouth once daily Discussed need for and importance of  continued work on weight loss Discussed need for medication compliance  Permanent Atrial Fibrillation: Followed by Dr. Marlou Porch Controlled Denies palpitations; reports some shortness of breath on exertion Current rate control: diltiazem 240 mg by mouth at bedtime and metoprolol succinate 12.5 mg by mouth once daily Most recent ECG: atrial fibrillation 11/05/20 Anticoagulation: none; patient has implanted Watchman Device Denies signs and symptoms of bleeding CHADS2VASc score: 5 - Age (2 points), Hypertension history (1 point), Vascular Disease history (prior MI, peripheral artery disease, or aortic plaque) (1 point), and Diabetes history (1 point) Home blood pressure: see above Home heart rate: usually within normal limits  Continue diltiazem 240 mg  by mouth at bedtime and metoprolol succinate 12.5 mg by mouth once daily per cardiology  Encouraged regular physical activity for general health benefits Recommend home blood pressure and heart rate monitoring, to bring results in next visit Discussed need for medication compliance  Patient Goals/Self-Care Activities Over the next 90  days, patient will:  Take medications as prescribed Check blood sugar 3-4 times per week at the following times: fasting (at least 8 hours since last food consumption) and whenever patient experiences symptoms of hypo/hyperglycemia, document, and provide at future appointments Check blood pressure at least once daily, document, and provide at future appointments  Follow Up Plan: Telephone follow up appointment with care management team member scheduled for: 08/12/21     Medication Assistance: None required.  Patient affirms current coverage meets needs.  Patient's preferred pharmacy is:  [facility]) - [place], [address] [place] [address] Phone: [phone] Fax: [place], [place] [address] Phone: [phone] Fax: [phone]  Follow Up:  Patient agrees to Care Plan and Follow-up.  Plan: Telephone follow up appointment with care management team member scheduled for:  08/12/21  Kennon Holter, PharmD, [place], [place] Clinical Pharmacist Practitioner [facility] [phone]

## 2021-05-12 NOTE — Patient Instructions (Signed)
Joaquin Music,  It was great to talk to you today!  Please call me with any questions or concerns.   Visit Information  Following are the goals we discussed today:  Patient Goals/Self-Care Activities Over the next 90  days, patient will:  Take medications as prescribed Check blood sugar 3-4 times per week at the following times: fasting (at least 8 hours since last food consumption) and whenever patient experiences symptoms of hypo/hyperglycemia, document, and provide at future appointments Check blood pressure at least once daily, document, and provide at future appointments  Plan: Telephone follow up appointment with care management team member scheduled for:  08/12/21  Kennon Holter, PharmD, BCACP, CPP Clinical Pharmacist Practitioner [facility] [phone]  Please call the care guide team at [phone] if you need to cancel or reschedule your appointment.   Patient verbalizes understanding of instructions provided today and agrees to view in [place].

## 2021-05-14 ENCOUNTER — Ambulatory Visit (INDEPENDENT_AMBULATORY_CARE_PROVIDER_SITE_OTHER): Payer: PPO | Admitting: Nurse Practitioner

## 2021-05-14 ENCOUNTER — Encounter: Payer: Self-pay | Admitting: Nurse Practitioner

## 2021-05-14 ENCOUNTER — Other Ambulatory Visit: Payer: Self-pay

## 2021-05-14 VITALS — BP 157/97 | HR 75 | Ht 69.0 in | Wt 208.0 lb

## 2021-05-14 DIAGNOSIS — E1165 Type 2 diabetes mellitus with hyperglycemia: Secondary | ICD-10-CM

## 2021-05-14 DIAGNOSIS — I10 Essential (primary) hypertension: Secondary | ICD-10-CM

## 2021-05-14 DIAGNOSIS — E785 Hyperlipidemia, unspecified: Secondary | ICD-10-CM

## 2021-05-14 DIAGNOSIS — Z0001 Encounter for general adult medical examination with abnormal findings: Secondary | ICD-10-CM

## 2021-05-14 DIAGNOSIS — S29012A Strain of muscle and tendon of back wall of thorax, initial encounter: Secondary | ICD-10-CM | POA: Insufficient documentation

## 2021-05-14 LAB — CMP14+EGFR
ALT: 14 IU/L (ref 0–44)
AST: 19 IU/L (ref 0–40)
Albumin/Globulin Ratio: 1.7 (ref 1.2–2.2)
Albumin: 4.3 g/dL (ref 3.7–4.7)
Alkaline Phosphatase: 40 IU/L — ABNORMAL LOW (ref 44–121)
BUN/Creatinine Ratio: 11 (ref 10–24)
BUN: 13 mg/dL (ref 8–27)
Bilirubin Total: 0.6 mg/dL (ref 0.0–1.2)
CO2: 27 mmol/L (ref 20–29)
Calcium: 9.1 mg/dL (ref 8.6–10.2)
Chloride: 98 mmol/L (ref 96–106)
Creatinine, Ser: 1.19 mg/dL (ref 0.76–1.27)
Globulin, Total: 2.5 g/dL (ref 1.5–4.5)
Glucose: 131 mg/dL — ABNORMAL HIGH (ref 70–99)
Potassium: 4.7 mmol/L (ref 3.5–5.2)
Sodium: 140 mmol/L (ref 134–144)
Total Protein: 6.8 g/dL (ref 6.0–8.5)
eGFR: 63 mL/min/{1.73_m2} (ref >59)

## 2021-05-14 LAB — HEMOGLOBIN A1C
Est. average glucose Bld gHb Est-mCnc: 131 mg/dL
Hgb A1c MFr Bld: 6.2 % — ABNORMAL HIGH (ref 4.8–5.6)

## 2021-05-14 LAB — CBC WITH DIFFERENTIAL/PLATELET
Basophils Absolute: 0 10*3/uL (ref 0.0–0.2)
Basos: 0 %
EOS (ABSOLUTE): 0.2 10*3/uL (ref 0.0–0.4)
Eos: 3 %
Hematocrit: 36 % — ABNORMAL LOW (ref 37.5–51.0)
Hemoglobin: 12 g/dL — ABNORMAL LOW (ref 13.0–17.7)
Immature Grans (Abs): 0 10*3/uL (ref 0.0–0.1)
Immature Granulocytes: 0 %
Lymphocytes Absolute: 0.3 10*3/uL — ABNORMAL LOW (ref 0.7–3.1)
Lymphs: 7 %
MCH: 28.2 pg (ref 26.6–33.0)
MCHC: 33.3 g/dL (ref 31.5–35.7)
MCV: 85 fL (ref 79–97)
Monocytes Absolute: 0.4 10*3/uL (ref 0.1–0.9)
Monocytes: 9 %
Neutrophils Absolute: 3.8 10*3/uL (ref 1.4–7.0)
Neutrophils: 81 %
Platelets: 183 10*3/uL (ref 150–450)
RBC: 4.25 x10E6/uL (ref 4.14–5.80)
RDW: 15.2 % (ref 11.6–15.4)
WBC: 4.7 10*3/uL (ref 3.4–10.8)

## 2021-05-14 LAB — LIPID PANEL WITH LDL/HDL RATIO
Cholesterol, Total: 132 mg/dL (ref 100–199)
HDL: 55 mg/dL (ref >39)
LDL Chol Calc (NIH): 62 mg/dL (ref 0–99)
LDL/HDL Ratio: 1.1 ratio (ref 0.0–3.6)
Triglycerides: 74 mg/dL (ref 0–149)
VLDL Cholesterol Cal: 15 mg/dL (ref 5–40)

## 2021-05-14 LAB — MICROALBUMIN / CREATININE URINE RATIO
Creatinine, Urine: 104.5 mg/dL
Microalb/Creat Ratio: 10 mg/g creat (ref 0–29)
Microalbumin, Urine: 10.9 ug/mL

## 2021-05-14 MED ORDER — CYCLOBENZAPRINE HCL 5 MG PO TABS: 5.0000 mg | ORAL_TABLET | Freq: Three times a day (TID) | ORAL | 1 refills | Status: AC | PRN

## 2021-05-14 NOTE — Assessment & Plan Note (Signed)
Lab Results  Component Value Date   HGBA1C 6.2 (H) 05/12/2021   -doing well today; continue current treatments

## 2021-05-14 NOTE — Progress Notes (Signed)
Labs are looking good! A1c is at goal at 6.2%.

## 2021-05-14 NOTE — Patient Instructions (Addendum)
Please have fasting labs drawn 2-3 days prior to your appointment so we can discuss the results during your office visit.  I will be moving to [facility] located at [address], [place], [place] [address] effective May 31, 2021. If you would like to establish care with [facility] please call [phone].

## 2021-05-14 NOTE — Assessment & Plan Note (Signed)
-  continue tylenol -Rx. flexeril

## 2021-05-14 NOTE — Assessment & Plan Note (Signed)
Lab Results  Component Value Date   CHOL 132 05/12/2021   HDL 55 05/12/2021   LDLCALC 62 05/12/2021   TRIG 74 05/12/2021   CHOLHDL 4.4 12/08/2012   -well controlled today

## 2021-05-14 NOTE — Assessment & Plan Note (Signed)
BP Readings from Last 3 Encounters:  05/14/21 (!) 157/97  01/27/21 130/80  01/21/21 130/84   -INCREASE metoprolol to 25 mg daily -he states SBP was in 120s when he had the 25 mg dose in the past (from 12.5 mg today), and that made him dizzy at times -his home SBPs have been in the 150s, so we are increasing the dose, but if he feels dizzy we will go back to 12.5 and just have elevated BP

## 2021-05-14 NOTE — Assessment & Plan Note (Signed)
-  back pain today; physical exam completed; labs reviewed

## 2021-05-14 NOTE — Progress Notes (Signed)
Acute Office Visit  Subjective:    Patient ID: [name], male    DOB: May 11, 1941, 80 y.o.   MRN: [id number]  Chief Complaint  Patient presents with   Annual Exam    CPE blood pressure staying in the 150 range.    HPI Patient is in today for physical exam and lab follow-up. His home BP has been elevated with SBP in 150s consistently.  He has T2DM, HTN, A-fib, and HLD among other comorbidities.  He has soreness to his lower back and left scapula that has been ongoing for several months.   It used to takes about an hour of work before the pain started, but lately it flares up within 10 minutes of doing work.  He takes tylenol, and that helps some.  His pain gets as high as a 5/10.  Past Medical History:  Diagnosis Date   A-fib [facility])    surgerical procedure repair.   AAA (abdominal aortic aneurysm)    AKI (acute kidney injury) ([place]) 04/04/2016   Aneurysm ([place])    Arthritis    to lower back   Chest pain 07/17/2011   Not clearly of cardiac origin.  Will rx empirically with a PPI and assess response.  Admitted to [facility] in 06/2011; echo-moderate LVH, normal EF, mild aortic root dilatation; stress nuclear-normal perfusion, normal EF.    Colon polyps    COPD (chronic obstructive pulmonary disease) ([place])    COVID-19    Degenerative joint disease    had disc fusion in the past   Diabetes mellitus without complication (HCC)    Hearing deficit, bilateral    left worse than right   History of blood transfusion 12/2007   "bleeding for Warfarin"   Hyperlipidemia    Hypertension    Admitted for chest pain and 06/2011-neg stress Myoview,mod. 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 s/p Watchman   Pneumonia    Renal hemorrhage, right 2009   2009 while anticoagulated with Coumadin;  treated with the renal artery embolization   Renal hemorrhage, right    2009 while anticoagulated with Coumadin; treated with the renal artery embolization    [place] 04/04[place]    Past Surgical History:  Procedure Laterality Date   ABDOMINAL AORTIC ENDOVASCULAR FENESTRATED STENT GRAFT N/A 05/08/2020   Procedure: ABDOMINAL AORTIC ENDOVASCULAR FENESTRATED STENT GRAFT;  Surgeon: CMarty Heck MD;  [place]  Service: Vascular;  Laterality: N/A;   BIOPSY  02/21/2017   Procedure: BIOPSY;  Surgeon: FDanie Binder MD;  [facility];  Service: Endoscopy;;  gastric   COLONOSCOPY N/A 02/21/2017   multiple polyps, one requiring piecemeal removal with epi and clip placement, multiple small and large mouthed diverticula, recto-sigmoid and sigmoid with significant excess looping, internal hemorrhoids. Path with one advanced adenoma, 3 simple adenomas, 2 hyperplastic polyps. Survelilance 3 years    EMBOLIZATION  12/2007   Left renal hematoma, status post angiogram and embolization/notes 09/29/2010   ESOPHAGOGASTRODUODENOSCOPY N/A 02/21/2017   benign-appearing esophageal stenosis s/p dilation, gastritis, scarred mucosa in pylorus due to prior PUD. reactive gastropathy on path   FRACTURE SURGERY     LEFT ATRIAL APPENDAGE OCCLUSION N/A 03/18/2016   Procedure: LEFT ATRIAL APPENDAGE OCCLUSION;  Surgeon: JThompson Grayer MD;  Location: [place] [facility];  Service: Cardiovascular;  Laterality: N/A;   ORIF FEMUR FRACTURE Right 1960   "2 steel rods in leg"   PATCH ANGIOPLASTY Left 05/08/2020   Procedure: PATCH ANGIOPLASTY LEFT FEMORAL ARTERY;  Surgeon: [name], MD;  Location: [place];  Service: Vascular;  Laterality: Left;   PILONIDAL CYST EXCISION     POLYPECTOMY  02/21/2017   Procedure: POLYPECTOMY;  Surgeon: Danie Binder, MD;  [facility];  Service: Endoscopy;;  colon   REPAIR ILIAC ARTERY Left 05/08/2020   Procedure: EXPLORATION AND REPAIR LEFT FEMORAL  ARTERY;  Surgeon: [name], MD;  Location: [place];  Service: Vascular;  Laterality: Left;   SAVORY DILATION N/A 02/21/2017   Procedure: SAVORY DILATION;  Surgeon: Danie Binder, MD;  [facility];  Service: Endoscopy;  Laterality: N/A;   TEE WITHOUT CARDIOVERSION N/A 03/10/2016   Procedure: TRANSESOPHAGEAL ECHOCARDIOGRAM (TEE);  Surgeon: Josue Hector, MD;  Location: [facility] ENDOSCOPY;  Service: Cardiovascular;  Laterality: N/A;   TEE WITHOUT CARDIOVERSION N/A 04/30/2016   Procedure: TRANSESOPHAGEAL ECHOCARDIOGRAM (TEE);  Surgeon: Josue Hector, MD;  Location[facility] ENDOSCOPY;  Service: Cardiovascular;  Laterality: N/A;   ULTRASOUND GUIDANCE FOR VASCULAR ACCESS Bilateral 05/08/2020   Procedure: ULTRASOUND GUIDANCE FOR VASCULAR ACCESS, bilateral femoral arteries;  Surgeon: [name], MD;  [facility];  Service: Vascular;  Laterality: Bilateral;   Watchman procedure  02/2016    Family History  Problem Relation Age of Onset   Hypertension Mother    Hyperlipidemia Mother    Renal cancer Mother    Stroke Father    Dementia Father    Atrial fibrillation Sister    Heart disease Brother    Colon cancer Cousin    Colon polyps Neg Hx    Esophageal cancer Neg Hx    Rectal cancer Neg Hx    Stomach cancer Neg Hx     Social History   Socioeconomic History   Marital status: Married    Spouse name: Not on file   Number of children: 2   Years of education: Not on file   Highest education level: Not on file  Occupational History   Occupation: Retired    Comment: Chief Technology Officer job  Tobacco Use   Smoking status: Former    Packs/day: 1.00    Years: 40.00    Pack years: 40.00    Types: Cigarettes    Quit date: 01/20/2008    Years since quitting: 13.3   Smokeless tobacco: Never  Vaping Use   Vaping Use: Never used  Substance and Sexual Activity   Alcohol use: Not Currently    Alcohol/week: 1.0 standard drink    Types: 1 Standard drinks or  equivalent per week    Comment: rare wine   Drug use: No   Sexual activity: Yes  Other Topics Concern   Not on file  Social History Narrative   Retired from Architect   Lives in [place] Resource Strain: Low Risk    Difficulty of Paying Living Expenses: Not hard at all  Food Insecurity: No Food Insecurity   Worried About Charity fundraiser in the Last Year: Never true   Arboriculturist in the Last Year: Never true  Transportation Needs: No Transportation Needs   Lack of Transportation (Medical): No   Lack of Transportation (Non-Medical): No  Physical Activity: Insufficiently Active   Days of  Exercise per Week: 2 days   Minutes of Exercise per Session: 40 min  Stress: No Stress Concern Present   Feeling of Stress : Not at all  Social Connections: Socially Integrated   Frequency of Communication with Friends and Family: More than three times a week   Frequency of Social Gatherings with Friends and Family: Twice a week   Attends Religious Services: More than 4 times per year   Active Member of [employer] or Organizations: Yes   Attends Music therapist: More than 4 times per year   Marital Status: Married  Human resources officer Violence: Not At Risk   Fear of Current or Ex-Partner: No   Emotionally Abused: No   Physically Abused: No   Sexually Abused: No    Outpatient Medications Prior to Visit  Medication Sig Dispense Refill   acetaminophen (TYLENOL) 500 MG tablet Take 1,000 mg by mouth every 8 (eight) hours as needed (pain).     albuterol (VENTOLIN HFA) 108 (90 Base) MCG/ACT inhaler Inhale 1-2 puffs into the lungs every 6 (six) hours as needed for wheezing or shortness of breath. 18 g 1   aspirin EC 81 MG tablet Take 81 mg by mouth daily. Swallow whole.     blood glucose meter kit and supplies Dispense based on patient and insurance preference. Use to monitor blood sugar daily. E11.65 1 each 0   cetirizine (ZYRTEC) 10 MG  tablet Take 10 mg by mouth at bedtime.      cholecalciferol (VITAMIN D3) 25 MCG (1000 UNIT) tablet Take 2,000 Units by mouth daily.     clopidogrel (PLAVIX) 75 MG tablet Take 1 tablet (75 mg total) by mouth daily at 6 (six) AM. 90 tablet 3   diltiazem (CARDIZEM CD) 240 MG 24 hr capsule Take 1 capsule (240 mg total) by mouth at bedtime. 90 capsule 1   fluticasone (FLONASE) 50 MCG/ACT nasal spray Place 1 spray into both nostrils daily.     Lancets (ONETOUCH DELICA PLUS [id number]) MISC USE TO TEST BLOOD SUGARADAILY AS DIRECTED     LORazepam (ATIVAN) 0.5 MG tablet Take 1 tablet (0.5 mg total) by mouth at bedtime. 90 tablet 1   meclizine (ANTIVERT) 25 MG tablet Take 25 mg by mouth 2 (two) times daily as needed. For dizziness May take an additional dose at [facility] if needed     metFORMIN (GLUCOPHAGE) 500 MG tablet Take 1 tablet (500 mg total) by mouth daily with breakfast. 90 tablet 1   metoprolol succinate (TOPROL-XL) 25 MG 24 hr tablet Take 0.5 tablets (12.5 mg total) by mouth daily. 90 tablet 1   mirabegron ER (MYRBETRIQ) 50 MG TB24 tablet Take 1 tablet by mouth at bedtime.     Multiple Vitamin (MULTIVITAMIN) tablet Take 1 tablet by mouth daily.     ONETOUCH VERIO test strip USE TO TEST BLOOD SUGARADAILY AS DIRECTED     pantoprazole (PROTONIX) 40 MG tablet Take 1 tablet (40 mg total) by mouth 2 (two) times daily. 180 tablet 0   rosuvastatin (CRESTOR) 5 MG tablet Take 1 tablet (5 mg total) by mouth daily. 90 tablet 1   tamsulosin (FLOMAX) 0.4 MG CAPS capsule Take 0.4 mg by mouth in the morning and at bedtime.      triamterene-hydrochlorothiazide (MAXZIDE-25) 37.5-25 MG tablet Take 1 tablet by mouth daily. 90 tablet 1   No facility-administered medications prior to visit.    Allergies  Allergen Reactions   Other Swelling       Substance With  3-hydroxy-3-methylglutaryl-coenzyme A Reductase Inhibitor Mechanism Of Action (substance)      Statins Other (See Comments)    Muscle aches Muscle aches    Rivaroxaban Other (See Comments)    Developed redness in both legs Developed redness in both legs    Review of Systems  Constitutional: Negative.   HENT: Negative.    Eyes: Negative.   Respiratory: Negative.    Cardiovascular: Negative.   Gastrointestinal: Negative.   Endocrine: Negative.   Genitourinary: Negative.   Musculoskeletal:  Positive for arthralgias and back pain.       Left shoulder pain  Skin: Negative.   Allergic/Immunologic: Negative.   Neurological: Negative.   Hematological: Negative.   Psychiatric/Behavioral: Negative.        Objective:    Physical Exam Constitutional:      Appearance: Normal appearance.  HENT:     Head: Normocephalic and atraumatic.     Right Ear: Tympanic membrane, ear canal and external ear normal.     Left Ear: Tympanic membrane, ear canal and external ear normal.     Nose: Nose normal.     Mouth/Throat:     Mouth: Mucous membranes are moist.     Pharynx: Oropharynx is clear.  Eyes:     Extraocular Movements: Extraocular movements intact.     Conjunctiva/sclera: Conjunctivae normal.     Pupils: Pupils are equal, round, and reactive to light.  Cardiovascular:     Rate and Rhythm: Normal rate. Rhythm irregular.     Pulses:          Dorsalis pedis pulses are 1+ on the right side and 1+ on the left side.     Heart sounds: Normal heart sounds.  Pulmonary:     Effort: Pulmonary effort is normal.     Breath sounds: Normal breath sounds.  Abdominal:     General: Abdomen is flat. Bowel sounds are normal.     Palpations: Abdomen is soft.  Musculoskeletal:        General: Tenderness present.     Cervical back: Normal range of motion and neck supple.     Comments: Tenderness below left scapula  Feet:     Right foot:     Protective Sensation: 10 sites tested.  9 sites sensed.     Skin integrity: Skin integrity normal.     Toenail Condition: Right toenails are normal.     Left foot:     Protective Sensation: 10 sites tested.  10  sites sensed.     Skin integrity: Skin integrity normal.     Toenail Condition: Left toenails are normal.  Skin:    General: Skin is warm and dry.     Capillary Refill: Capillary refill takes less than 2 seconds.  Neurological:     General: No focal deficit present.     Mental Status: He is alert and oriented to person, place, and time.     Cranial Nerves: No cranial nerve deficit.     Sensory: No sensory deficit.     Motor: No weakness.     Coordination: Coordination normal.     Gait: Gait normal.  Psychiatric:        Mood and Affect: Mood normal.        Behavior: Behavior normal.        Thought Content: Thought content normal.        Judgment: Judgment normal.    BP (!) 157/97    Pulse 75    Ht _0  (  1.753 m)    Wt 208 lb (94.3 kg)    SpO2 98%    BMI 30.72 kg/m  Wt Readings from Last 3 Encounters:  05/14/21 208 lb (94.3 kg)  01/27/21 208 lb 3.2 oz (94.4 kg)  12/09/20 203 lb 6.4 oz (92.3 kg)    Health Maintenance Due  Topic Date Due   FOOT EXAM  Never done   COVID-19 Vaccine (3 - Booster for Moderna series) 11/20/2019    There are no preventive care reminders to display for this patient.   Lab Results  Component Value Date   TSH 3.758 06/19/2012   Lab Results  Component Value Date   WBC 4.7 05/12/2021   HGB 12.0 (L) 05/12/2021   HCT 36.0 (L) 05/12/2021   MCV 85 05/12/2021   PLT 183 05/12/2021   Lab Results  Component Value Date   NA 140 05/12/2021   K 4.7 05/12/2021   CO2 27 05/12/2021   GLUCOSE 131 (H) 05/12/2021   BUN 13 05/12/2021   CREATININE 1.19 05/12/2021   BILITOT 0.6 05/12/2021   ALKPHOS 40 (L) 05/12/2021   AST 19 05/12/2021   ALT 14 05/12/2021   PROT 6.8 05/12/2021   ALBUMIN 4.3 05/12/2021   CALCIUM 9.1 05/12/2021   ANIONGAP 9 11/05/2020   EGFR 63 05/12/2021   Lab Results  Component Value Date   CHOL 132 05/12/2021   Lab Results  Component Value Date   HDL 55 05/12/2021   Lab Results  Component Value Date   LDLCALC 62  05/12/2021   Lab Results  Component Value Date   TRIG 74 05/12/2021   Lab Results  Component Value Date   CHOLHDL 4.4 12/08/2012   Lab Results  Component Value Date   HGBA1C 6.2 (H) 05/12/2021       Assessment & Plan:   Problem List Items Addressed This Visit       Cardiovascular and Mediastinum   Essential (primary) hypertension    BP Readings from Last 3 Encounters:  05/14/21 (!) 157/97  01/27/21 130/80  01/21/21 130/84  -INCREASE metoprolol to 25 mg daily -he states SBP was in 120s when he had the 25 mg dose in the past (from 12.5 mg today), and that made him dizzy at times -his home SBPs have been in the 150s, so we are increasing the dose, but if he feels dizzy we will go back to 12.5 and just have elevated BP      Relevant Orders   CMP14+EGFR   CBC with Differential/Platelet   Lipid Panel With LDL/HDL Ratio     Endocrine   Diabetes mellitus with hyperglycemia ([place])    Lab Results  Component Value Date   HGBA1C 6.2 (H) 05/12/2021  -doing well today; continue current treatments      Relevant Orders   CMP14+EGFR   CBC with Differential/Platelet   Lipid Panel With LDL/HDL Ratio   Hemoglobin A1c     Musculoskeletal and Integument   Muscle strain of left upper back    -continue tylenol -Rx. flexeril      Relevant Medications   cyclobenzaprine (FLEXERIL) 5 MG tablet     Other   Hyperlipidemia LDL goal <70    Lab Results  Component Value Date   CHOL 132 05/12/2021   HDL 55 05/12/2021   LDLCALC 62 05/12/2021   TRIG 74 05/12/2021   CHOLHDL 4.4 12/08/2012  -well controlled today      Relevant Orders   Lipid Panel With LDL/HDL Ratio  Encounter for general adult medical examination with abnormal findings - Primary    -back pain today; physical exam completed; labs reviewed        Meds ordered this encounter  Medications   cyclobenzaprine (FLEXERIL) 5 MG tablet    Sig: Take 1 tablet (5 mg total) by mouth 3 (three) times daily as needed for  muscle spasms.    Dispense:  30 tablet    Refill:  [place], NP

## 2021-06-09 ENCOUNTER — Other Ambulatory Visit: Payer: Self-pay

## 2021-06-09 ENCOUNTER — Ambulatory Visit (HOSPITAL_COMMUNITY)
Admission: RE | Admit: 2021-06-09 | Discharge: 2021-06-09 | Disposition: A | Discharge status: Home / Self Care | Payer: HMO | Source: Ambulatory Visit | Attending: Vascular Surgery | Admitting: Vascular Surgery

## 2021-06-09 ENCOUNTER — Ambulatory Visit: Payer: HMO | Admitting: Vascular Surgery

## 2021-06-09 ENCOUNTER — Encounter: Payer: Self-pay | Admitting: *Deleted

## 2021-06-09 ENCOUNTER — Encounter: Payer: Self-pay | Admitting: Vascular Surgery

## 2021-06-09 VITALS — BP 143/95 | HR 59 | Temp 97.3°F | Resp 16 | Ht 69.0 in | Wt 202.0 lb

## 2021-06-09 DIAGNOSIS — I7143 Infrarenal abdominal aortic aneurysm, without rupture: Secondary | ICD-10-CM | POA: Diagnosis not present

## 2021-06-09 DIAGNOSIS — I714 Abdominal aortic aneurysm, without rupture, unspecified: Secondary | ICD-10-CM

## 2021-06-09 DIAGNOSIS — Z2821 Immunization not carried out because of patient refusal: Secondary | ICD-10-CM | POA: Insufficient documentation

## 2021-06-09 NOTE — Progress Notes (Signed)
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 male presents for 6 month follow-up for ongoing surveillance of fenestrated endograft on 05/08/2020.  This was for 6 cm juxtarenal AAA.  Ultimately used [facility] device including bilateral small renal fenestrations and SMA scallop.  Both renal arteries were treated with VBX stents.  He did require cutdown on the left groin for repair of the common femoral artery after the closure device failed.    No new concerns today.  May be moving to [place].  Did have a CT scan in August by his urologist of abdomen/pelvis with contrast.   Past Medical History:  Diagnosis Date   A-fib [facility])    surgerical procedure repair.   AAA (abdominal aortic aneurysm)    AKI (acute kidney injury) ([place]) 04/04/2016   Aneurysm ([place])    Arthritis    to lower back   Chest pain 07/17/2011   Not clearly of cardiac origin.  Will rx empirically with a PPI and assess response.  Admitted to [facility] in 06/2011; echo-moderate LVH, normal EF, mild aortic root dilatation; stress nuclear-normal perfusion, normal EF.    Colon polyps    COPD (chronic obstructive pulmonary disease) ([place])    COVID-19    Degenerative joint disease    had disc fusion in the past   Diabetes mellitus without complication (HCC)    Hearing deficit, bilateral    left worse than right   History of blood transfusion 12/2007   "bleeding for Warfarin"   Hyperlipidemia    Hypertension    Admitted for chest pain and 06/2011-neg stress Myoview,mod. 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 s/p Watchman   Pneumonia    Renal hemorrhage, right 2009    2009 while anticoagulated with Coumadin; treated with the renal artery embolization   Renal hemorrhage, right    2009 while anticoagulated with Coumadin; treated with the renal artery embolization    [place] 04/04[place]    Past Surgical History:  Procedure Laterality Date   ABDOMINAL AORTIC ENDOVASCULAR FENESTRATED STENT GRAFT N/A 05/08/2020   Procedure: ABDOMINAL AORTIC ENDOVASCULAR FENESTRATED STENT GRAFT;  Surgeon: CMarty Heck MD;  [place]  Service: Vascular;  Laterality: N/A;   BIOPSY  02/21/2017   Procedure: BIOPSY;  Surgeon: FDanie Binder MD;  [facility];  Service: Endoscopy;;  gastric   COLONOSCOPY N/A 02/21/2017   multiple polyps, one requiring piecemeal removal with epi and clip placement, multiple small and large mouthed diverticula, recto-sigmoid and sigmoid with significant excess looping, internal hemorrhoids. Path with one advanced adenoma, 3 simple adenomas, 2 hyperplastic polyps. Survelilance 3 years    EMBOLIZATION  12/2007   Left renal hematoma, status post angiogram and embolization/notes 09/29/2010   ESOPHAGOGASTRODUODENOSCOPY N/A 02/21/2017   benign-appearing esophageal stenosis s/p dilation, gastritis, scarred mucosa in pylorus due to prior PUD. reactive gastropathy on path   FRACTURE SURGERY     LEFT ATRIAL APPENDAGE OCCLUSION N/A 03/18/2016   Procedure: LEFT ATRIAL APPENDAGE OCCLUSION;  Surgeon: JThompson Grayer MD;  [facility];  Service: Cardiovascular;  Laterality:  N/A;   ORIF FEMUR FRACTURE Right 1960   "2 steel rods in leg"   PATCH ANGIOPLASTY Left 05/08/2020   Procedure: PATCH ANGIOPLASTY LEFT FEMORAL ARTERY;  Surgeon: [name], MD;  Location: [place];  Service: Vascular;  Laterality: Left;   PILONIDAL CYST EXCISION     POLYPECTOMY  02/21/2017   Procedure: POLYPECTOMY;  Surgeon: Danie Binder, MD;  [facility];  Service: Endoscopy;;  colon   REPAIR ILIAC ARTERY Left 05/08/2020   Procedure:  EXPLORATION AND REPAIR LEFT FEMORAL ARTERY;  Surgeon: [name], MD;  Location: [place];  Service: Vascular;  Laterality: Left;   SAVORY DILATION N/A 02/21/2017   Procedure: SAVORY DILATION;  Surgeon: Danie Binder, MD;  [facility];  Service: Endoscopy;  Laterality: N/A;   TEE WITHOUT CARDIOVERSION N/A 03/10/2016   Procedure: TRANSESOPHAGEAL ECHOCARDIOGRAM (TEE);  Surgeon: Josue Hector, MD;  Location: [facility] ENDOSCOPY;  Service: Cardiovascular;  Laterality: N/A;   TEE WITHOUT CARDIOVERSION N/A 04/30/2016   Procedure: TRANSESOPHAGEAL ECHOCARDIOGRAM (TEE);  Surgeon: Josue Hector, MD;  Location: [facility] ENDOSCOPY;  Service: Cardiovascular;  Laterality: N/A;   ULTRASOUND GUIDANCE FOR VASCULAR ACCESS Bilateral 05/08/2020   Procedure: ULTRASOUND GUIDANCE FOR VASCULAR ACCESS, bilateral femoral arteries;  Surgeon: [name], MD;  [facility];  Service: Vascular;  Laterality: Bilateral;   Watchman procedure  02/2016    Family History  Problem Relation Age of Onset   Hypertension Mother    Hyperlipidemia Mother    Renal cancer Mother    Stroke Father    Dementia Father    Atrial fibrillation Sister    Heart disease Brother    Colon cancer Cousin    Colon polyps Neg Hx    Esophageal cancer Neg Hx    Rectal cancer Neg Hx    Stomach cancer Neg Hx     SOCIAL HISTORY: Social History   Tobacco Use   Smoking status: Former    Packs/day: 1.00    Years: 40.00    Pack years: 40.00    Types: Cigarettes    Quit date: 01/20/2008    Years since quitting: 13.3   Smokeless tobacco: Never  Substance Use Topics   Alcohol use: Not Currently    Alcohol/week: 1.0 standard drink    Types: 1 Standard drinks or equivalent per week    Comment: rare wine    Allergies  Allergen Reactions   Other Swelling       Substance With 3-hydroxy-3-methylglutaryl-coenzyme A Reductase Inhibitor Mechanism Of Action (substance)      Statins Other (See Comments)    Muscle  aches Muscle aches   Rivaroxaban Other (See Comments)    Developed redness in both legs Developed redness in both legs    Current Outpatient Medications  Medication Sig Dispense Refill   acetaminophen (TYLENOL) 500 MG tablet Take 1,000 mg by mouth every 8 (eight) hours as needed (pain).     albuterol (VENTOLIN HFA) 108 (90 Base) MCG/ACT inhaler Inhale 1-2 puffs into the lungs every 6 (six) hours as needed for wheezing or shortness of breath. 18 g 1   aspirin EC 81 MG tablet Take 81 mg by mouth daily. Swallow whole.     blood glucose meter kit and supplies Dispense based on patient and insurance preference. Use to monitor blood sugar daily. E11.65 1 each 0   cetirizine (ZYRTEC) 10 MG tablet Take 10 mg by mouth at bedtime.      cholecalciferol (VITAMIN D3)  25 MCG (1000 UNIT) tablet Take 2,000 Units by mouth daily.     clopidogrel (PLAVIX) 75 MG tablet Take 1 tablet (75 mg total) by mouth daily at 6 (six) AM. 90 tablet 3   cyclobenzaprine (FLEXERIL) 5 MG tablet Take 1 tablet (5 mg total) by mouth 3 (three) times daily as needed for muscle spasms. 30 tablet 1   diltiazem (CARDIZEM CD) 240 MG 24 hr capsule Take 1 capsule (240 mg total) by mouth at bedtime. 90 capsule 1   fluticasone (FLONASE) 50 MCG/ACT nasal spray Place 1 spray into both nostrils daily.     Lancets (ONETOUCH DELICA PLUS [id number]) MISC USE TO TEST BLOOD SUGARADAILY AS DIRECTED     LORazepam (ATIVAN) 0.5 MG tablet Take 1 tablet (0.5 mg total) by mouth at bedtime. 90 tablet 1   meclizine (ANTIVERT) 25 MG tablet Take 25 mg by mouth 2 (two) times daily as needed. For dizziness May take an additional dose at [facility] if needed     metFORMIN (GLUCOPHAGE) 500 MG tablet Take 1 tablet (500 mg total) by mouth daily with breakfast. 90 tablet 1   metoprolol succinate (TOPROL-XL) 25 MG 24 hr tablet Take 0.5 tablets (12.5 mg total) by mouth daily. 90 tablet 1   mirabegron ER (MYRBETRIQ) 50 MG TB24 tablet Take 1 tablet by mouth at bedtime.      Multiple Vitamin (MULTIVITAMIN) tablet Take 1 tablet by mouth daily.     ONETOUCH VERIO test strip USE TO TEST BLOOD SUGARADAILY AS DIRECTED     pantoprazole (PROTONIX) 40 MG tablet Take 1 tablet (40 mg total) by mouth 2 (two) times daily. 180 tablet 0   rosuvastatin (CRESTOR) 5 MG tablet Take 1 tablet (5 mg total) by mouth daily. 90 tablet 1   tamsulosin (FLOMAX) 0.4 MG CAPS capsule Take 0.4 mg by mouth in the morning and at bedtime.      triamterene-hydrochlorothiazide (MAXZIDE-25) 37.5-25 MG tablet Take 1 tablet by mouth daily. 90 tablet 1   No current facility-administered medications for this visit.    REVIEW OF SYSTEMS:  [X]  denotes positive finding, [ ]  denotes negative finding Cardiac  Comments:  Chest pain or chest pressure:    Shortness of breath upon exertion:    Short of breath when lying flat:    Irregular heart rhythm:        Vascular    Pain in calf, thigh, or hip brought on by ambulation:    Pain in feet at night that wakes you up from your sleep:     Blood clot in your veins:    Leg swelling:         Pulmonary    Oxygen at home:    Productive cough:     Wheezing:         Neurologic    Sudden weakness in arms or legs:     Sudden numbness in arms or legs:     Sudden onset of difficulty speaking or slurred speech:    Temporary loss of vision in one eye:     Problems with dizziness:         Gastrointestinal    Blood in stool:     Vomited blood:         Genitourinary    Burning when urinating:     Blood in urine:        Psychiatric    Major depression:         Hematologic    Bleeding problems:  Problems with blood clotting too easily:        Skin    Rashes or ulcers:        Constitutional    Fever or chills:      PHYSICAL EXAM: Vitals:   06/09/21 1013  BP: (!) 143/95  Pulse: (!) 59  Resp: 16  Temp: (!) 97.3 F (36.3 C)  TempSrc: Temporal  SpO2: 95%  Weight: 202 lb (91.6 kg)  Height: 5' 9"  (1.753 m)    GENERAL: The patient is a  well-nourished male, in no acute distress. The vital signs are documented above. CARDIAC: There is a regular rate and rhythm.  VASCULAR:  Bilateral femoral pulses palpable Left groin incision healed Bilateral PT pulses palpable  DATA:   CT abdomen pelvis with contrast reviewed from 01/20/2021 ordered by his urologist and I see no evidence of endoleak with good position of the fenestrated stent graft and both renal stents.  The maximal aortic diameter is 6.2 cm on sagittal imaging and is the same as his CT scan on 05/29/2020 approximately one month after stent was initially placed.  EVAR duplex today shows both renal stents patent with no evidence and the AAA stent graft has no endoleak and a patent bifurcated endograft.  Maximal aortic diameter appears to be 6.3 cm  Assessment/Plan:  81 year old male status post fenestrated endograft using the [facility] Zfen device with 2 small renal fenestrations and SMA scallop on 05/08/2020.  He continues to look good from my standpoint.  No evidence of endoleak on EVAR duplex today and both renal stents are patent by duplex.  His aneurysm measures maximal diameter of 6.3 cm today.  I did review a CT scan from August ordered by his urologist that was CT abd/pelvis w/ contrast and I see no evidence of endoleak on that CT scan and maximal aortic diameter on sagittal imaging appears to be 6.2 cm the same size as his follow-up CT at 1 month after stent placement in December 2021.  I am comfortable with continued surveillance at this time.  I will see him in 6 months with EVAR duplex and bilateral renal artery duplex.  He is talking about moving to [place] and happy to help with the transition if needed.  [name], MD [facility] Office: [phone]

## 2021-06-12 ENCOUNTER — Other Ambulatory Visit: Payer: Self-pay | Admitting: *Deleted

## 2021-06-12 DIAGNOSIS — I714 Abdominal aortic aneurysm, without rupture, unspecified: Secondary | ICD-10-CM

## 2021-06-12 DIAGNOSIS — I701 Atherosclerosis of renal artery: Secondary | ICD-10-CM

## 2021-06-16 ENCOUNTER — Other Ambulatory Visit: Payer: Self-pay | Admitting: Internal Medicine

## 2021-06-16 ENCOUNTER — Telehealth: Payer: Self-pay | Admitting: Internal Medicine

## 2021-06-16 DIAGNOSIS — I10 Essential (primary) hypertension: Secondary | ICD-10-CM

## 2021-06-16 MED ORDER — METOPROLOL SUCCINATE ER 25 MG PO TB24: 25.0000 mg | ORAL_TABLET | Freq: Every day | ORAL | 0 refills | Status: DC

## 2021-06-16 NOTE — Telephone Encounter (Signed)
Pt called in regard to prescription change   Pt states that they were supposed to take med as is for 30 days and then change dosage ,  Pt wants a call back to discuss and clarify   metoprolol succinate (TOPROL-XL) 25 MG 24 hr tablet

## 2021-06-25 ENCOUNTER — Ambulatory Visit (INDEPENDENT_AMBULATORY_CARE_PROVIDER_SITE_OTHER): Payer: HMO | Admitting: Internal Medicine

## 2021-06-25 ENCOUNTER — Other Ambulatory Visit: Payer: Self-pay

## 2021-06-25 ENCOUNTER — Encounter: Payer: Self-pay | Admitting: Internal Medicine

## 2021-06-25 ENCOUNTER — Telehealth: Payer: Self-pay | Admitting: Nurse Practitioner

## 2021-06-25 DIAGNOSIS — Z20822 Contact with and (suspected) exposure to covid-19: Secondary | ICD-10-CM

## 2021-06-25 DIAGNOSIS — J209 Acute bronchitis, unspecified: Secondary | ICD-10-CM

## 2021-06-25 MED ORDER — AZITHROMYCIN 250 MG PO TABS: ORAL_TABLET | ORAL | 0 refills | Status: DC

## 2021-06-25 MED ORDER — BENZONATATE 100 MG PO CAPS: 100.0000 mg | ORAL_CAPSULE | Freq: Two times a day (BID) | ORAL | 0 refills | Status: AC | PRN

## 2021-06-25 NOTE — Progress Notes (Signed)
Virtual Visit via Telephone Note   This visit type was conducted due to national recommendations for restrictions regarding the COVID-19 Pandemic (e.g. social distancing) in an effort to limit this patient's exposure and mitigate transmission in our community.  Due to his co-morbid illnesses, this patient is at least at moderate risk for complications without adequate follow up.  This format is felt to be most appropriate for this patient at this time.  The patient did not have access to video technology/had technical difficulties with video requiring transitioning to audio format only (telephone).  All issues noted in this document were discussed and addressed.  No physical exam could be performed with this format.  Evaluation Performed:  Follow-up visit  Date:  06/25/2021   ID:  [name], [name] [birth year], MRN [id number]  Patient Location: Home Provider Location: Office/Clinic  Participants: Patient Location of Patient: Home Location of Provider: Telehealth Consent was obtain for visit to be over via telehealth. I verified that I am speaking with the correct person using two identifiers.  PCP:  Lindell Spar, MD   Chief Complaint: Cough  History of Present Illness:    [name] is a 81 y.o. male who has a televisit for complaint of cough, congestion and mild dyspnea for the last 3 days.  He denies any fever, chills, nausea or vomiting currently.  His home COVID test was negative today.  He reports history of CAP in the last year.  The patient does have symptoms concerning for COVID-19 infection (fever, chills, cough, or new shortness of breath).   Past Medical, Surgical, Social History, Allergies, and Medications have been Reviewed.  Past Medical History:  Diagnosis Date   A-fib [facility])    surgerical procedure repair.   AAA (abdominal aortic aneurysm)    AKI (acute kidney injury) ([place]) 04/04/2016   Aneurysm ([place])    Arthritis    to lower back   Chest pain  07/17/2011   Not clearly of cardiac origin.  Will rx empirically with a PPI and assess response.  Admitted to [facility] in 06/2011; echo-moderate LVH, normal EF, mild aortic root dilatation; stress nuclear-normal perfusion, normal EF.    Colon polyps    COPD (chronic obstructive pulmonary disease) ([place])    COVID-19    Degenerative joint disease    had disc fusion in the past   Diabetes mellitus without complication (HCC)    Hearing deficit, bilateral    left worse than right   History of blood transfusion 12/2007   "bleeding for Warfarin"   Hyperlipidemia    Hypertension    Admitted for chest pain and 06/2011-neg stress Myoview,mod. 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 s/p Watchman   Pneumonia    Renal hemorrhage, right 2009   2009 while anticoagulated with Coumadin; treated with the renal artery embolization   Renal hemorrhage, right    2009 while anticoagulated with Coumadin; treated with the renal artery embolization    [place] 04/04/[place]   Past Surgical History:  Procedure Laterality Date   ABDOMINAL AORTIC ENDOVASCULAR FENESTRATED STENT GRAFT N/A 05/08/2020   Procedure: ABDOMINAL AORTIC ENDOVASCULAR FENESTRATED STENT GRAFT;  Surgeon: CMarty Heck MD;  [place]  Service: Vascular;  Laterality: N/A;  BIOPSY  02/21/2017   Procedure: BIOPSY;  Surgeon: Danie Binder, MD;  [facility];  Service: Endoscopy;;  gastric   COLONOSCOPY N/A 02/21/2017   multiple polyps, one requiring piecemeal removal with epi and clip placement, multiple small and large mouthed diverticula, recto-sigmoid and sigmoid with significant excess looping, internal hemorrhoids. Path with one advanced adenoma, 3 simple adenomas, 2 hyperplastic polyps. Survelilance 3 years    EMBOLIZATION   12/2007   Left renal hematoma, status post angiogram and embolization/notes 09/29/2010   ESOPHAGOGASTRODUODENOSCOPY N/A 02/21/2017   benign-appearing esophageal stenosis s/p dilation, gastritis, scarred mucosa in pylorus due to prior PUD. reactive gastropathy on path   FRACTURE SURGERY     LEFT ATRIAL APPENDAGE OCCLUSION N/A 03/18/2016   Procedure: LEFT ATRIAL APPENDAGE OCCLUSION;  Surgeon: [name], MD;  Location: [place] [facility];  Service: Cardiovascular;  Laterality: N/A;   ORIF FEMUR FRACTURE Right 1960   "2 steel rods in leg"   PATCH ANGIOPLASTY Left 05/08/2020   Procedure: PATCH ANGIOPLASTY LEFT FEMORAL ARTERY;  Surgeon: [name], MD;  Location: [place];  Service: Vascular;  Laterality: Left;   PILONIDAL CYST EXCISION     POLYPECTOMY  02/21/2017   Procedure: POLYPECTOMY;  Surgeon: Danie Binder, MD;  [facility];  Service: Endoscopy;;  colon   REPAIR ILIAC ARTERY Left 05/08/2020   Procedure: EXPLORATION AND REPAIR LEFT FEMORAL ARTERY;  Surgeon: [name], MD;  Location: [place];  Service: Vascular;  Laterality: Left;   SAVORY DILATION N/A 02/21/2017   Procedure: SAVORY DILATION;  Surgeon: Danie Binder, MD;  [facility];  Service: Endoscopy;  Laterality: N/A;   TEE WITHOUT CARDIOVERSION N/A 03/10/2016   Procedure: TRANSESOPHAGEAL ECHOCARDIOGRAM (TEE);  Surgeon: Josue Hector, MD;  Location: [facility] ENDOSCOPY;  Service: Cardiovascular;  Laterality: N/A;   TEE WITHOUT CARDIOVERSION N/A 04/30/2016   Procedure: TRANSESOPHAGEAL ECHOCARDIOGRAM (TEE);  Surgeon: Josue Hector, MD;  Location[facility] ENDOSCOPY;  Service: Cardiovascular;  Laterality: N/A;   ULTRASOUND GUIDANCE FOR VASCULAR ACCESS Bilateral 05/08/2020   Procedure: ULTRASOUND GUIDANCE FOR VASCULAR ACCESS, bilateral femoral arteries;  Surgeon: [name], MD;  [facility];  Service: Vascular;  Laterality: Bilateral;   Watchman procedure  02/2016     Current Meds  Medication Sig    acetaminophen (TYLENOL) 500 MG tablet Take 1,000 mg by mouth every 8 (eight) hours as needed (pain).   albuterol (VENTOLIN HFA) 108 (90 Base) MCG/ACT inhaler Inhale 1-2 puffs into the lungs every 6 (six) hours as needed for wheezing or shortness of breath.   aspirin EC 81 MG tablet Take 81 mg by mouth daily. Swallow whole.   azithromycin (ZITHROMAX) 250 MG tablet Take 2 tablets on day 1, then 1 tablet daily on days 2 through 5   benzonatate (TESSALON) 100 MG capsule Take 1 capsule (100 mg total) by mouth 2 (two) times daily as needed for cough.   blood glucose meter kit and supplies Dispense based on patient and insurance preference. Use to monitor blood sugar daily. E11.65   cetirizine (ZYRTEC) 10 MG tablet Take 10 mg by mouth at bedtime.    cholecalciferol (VITAMIN D3) 25 MCG (1000 UNIT) tablet Take 2,000 Units by mouth daily.   clopidogrel (PLAVIX) 75 MG tablet Take 1 tablet (75 mg total) by mouth daily at 6 (six) AM.   cyclobenzaprine (FLEXERIL) 5 MG tablet Take 1 tablet (5 mg total) by mouth 3 (three) times daily as needed for muscle  spasms.   diltiazem (CARDIZEM CD) 240 MG 24 hr capsule Take 1 capsule (240 mg total) by mouth at bedtime.   fluticasone (FLONASE) 50 MCG/ACT nasal spray Place 1 spray into both nostrils daily.   Lancets (ONETOUCH DELICA PLUS [id number]) MISC USE TO TEST BLOOD SUGARADAILY AS DIRECTED   LORazepam (ATIVAN) 0.5 MG tablet Take 1 tablet (0.5 mg total) by mouth at bedtime.   meclizine (ANTIVERT) 25 MG tablet Take 25 mg by mouth 2 (two) times daily as needed. For dizziness May take an additional dose at [facility] if needed   metFORMIN (GLUCOPHAGE) 500 MG tablet Take 1 tablet (500 mg total) by mouth daily with breakfast.   metoprolol succinate (TOPROL-XL) 25 MG 24 hr tablet Take 1 tablet (25 mg total) by mouth daily.   mirabegron ER (MYRBETRIQ) 50 MG TB24 tablet Take 1 tablet by mouth at bedtime.   Multiple Vitamin (MULTIVITAMIN) tablet Take 1 tablet by mouth daily.    ONETOUCH VERIO test strip USE TO TEST BLOOD SUGARADAILY AS DIRECTED   pantoprazole (PROTONIX) 40 MG tablet Take 1 tablet (40 mg total) by mouth 2 (two) times daily.   rosuvastatin (CRESTOR) 5 MG tablet Take 1 tablet (5 mg total) by mouth daily.   tamsulosin (FLOMAX) 0.4 MG CAPS capsule Take 0.4 mg by mouth in the morning and at bedtime.    triamterene-hydrochlorothiazide (MAXZIDE-25) 37.5-25 MG tablet Take 1 tablet by mouth daily.     Allergies:   Other, Statins, and Rivaroxaban   ROS:   Please see the history of present illness.     All other systems reviewed and are negative.   Labs/Other Tests and Data Reviewed:    Recent Labs: 05/12/2021: ALT 14; BUN 13; Creatinine, Ser 1.19; Hemoglobin 12.0; Platelets 183; Potassium 4.7; Sodium 140   Recent Lipid Panel Lab Results  Component Value Date/Time   CHOL 132 05/12/2021 08:25 AM   TRIG 74 05/12/2021 08:25 AM   HDL 55 05/12/2021 08:25 AM   CHOLHDL 4.4 12/08/2012 07:05 AM   LDLCALC 62 05/12/2021 08:25 AM    Wt Readings from Last 3 Encounters:  06/09/21 202 lb (91.6 kg)  05/14/21 208 lb (94.3 kg)  01/27/21 208 lb 3.2 oz (94.4 kg)     ASSESSMENT & PLAN:    Acute bronchitis Suspected COVID-19 infection  Check home COVID test - reported negative later during the day of visit Started azithromycin Continue Mucinex or Robitussin for cough Tessalon as needed for cough Albuterol as needed for dyspnea or wheezing  Time:   Today, I have spent 15 minutes reviewing the chart, including problem list, medications, and with the patient with telehealth technology discussing the above problems.   Medication Adjustments/Labs and Tests Ordered: Current medicines are reviewed at length with the patient today.  Concerns regarding medicines are outlined above.   Tests Ordered: No orders of the defined types were placed in this encounter.   Medication Changes: Meds ordered this encounter  Medications   benzonatate (TESSALON) 100 MG  capsule    Sig: Take 1 capsule (100 mg total) by mouth 2 (two) times daily as needed for cough.    Dispense:  20 capsule    Refill:  0   azithromycin (ZITHROMAX) 250 MG tablet    Sig: Take 2 tablets on day 1, then 1 tablet daily on days 2 through 5    Dispense:  6 tablet    Refill:  0     Note: This dictation was prepared with Dragon dictation along  with smaller phrase technology. Similar sounding words can be transcribed inadequately or may not be corrected upon review. Any transcriptional errors that result from this process are unintentional.      Disposition:  Follow up  Signed, Lindell Spar, MD  06/25/2021 1:35 PM     [facility]

## 2021-06-25 NOTE — Telephone Encounter (Signed)
Pt advised with verbal understanding  °

## 2021-06-25 NOTE — Telephone Encounter (Signed)
Pt called in to give covid results    Results = Negative

## 2021-06-25 NOTE — Patient Instructions (Signed)
Please contact us with home COVID test result.

## 2021-06-27 ENCOUNTER — Encounter: Payer: Self-pay | Admitting: *Deleted

## 2021-06-27 ENCOUNTER — Ambulatory Visit (HOSPITAL_COMMUNITY): Payer: HMO

## 2021-06-27 ENCOUNTER — Ambulatory Visit (INDEPENDENT_AMBULATORY_CARE_PROVIDER_SITE_OTHER): Payer: HMO

## 2021-06-27 ENCOUNTER — Ambulatory Visit
Admission: EM | Admit: 2021-06-27 | Discharge: 2021-06-27 | Disposition: A | Discharge status: Home / Self Care | Payer: HMO | Attending: Family Medicine | Admitting: Family Medicine

## 2021-06-27 ENCOUNTER — Other Ambulatory Visit: Payer: Self-pay

## 2021-06-27 DIAGNOSIS — J069 Acute upper respiratory infection, unspecified: Secondary | ICD-10-CM

## 2021-06-27 DIAGNOSIS — R059 Cough, unspecified: Secondary | ICD-10-CM

## 2021-06-27 MED ORDER — HYDROCODONE BIT-HOMATROP MBR 5-1.5 MG/5ML PO SOLN: 5.0000 mL | Freq: Four times a day (QID) | ORAL | 0 refills | Status: AC | PRN

## 2021-06-27 NOTE — ED Triage Notes (Addendum)
C/O very productive cough onset 3 days ago. Unsure if fevers. Started this AM with slight runny nose. Also c/o slight right earache.  States had negative Covid test with PCP office, and had [employer] Rx sent in; pt feels he is getting worse.

## 2021-06-27 NOTE — ED Provider Notes (Signed)
[place]   [id number] 06/27/21 Arrival Time: 8144  ASSESSMENT & PLAN:  1. Viral URI with cough    I have personally viewed the imaging studies ordered this visit. No acute changes on CXR  Discussed typical duration of viral illnesses. Viral testing declined. OTC symptom care as needed.  Begin: New Prescriptions   HYDROCODONE BIT-HOMATROPINE (HYCODAN) 5-1.5 MG/5ML SYRUP    Take 5 mLs by mouth every 6 (six) hours as needed for cough.     Follow-up Information     Lindell Spar, MD.   Specialty: Internal Medicine Why: If worsening or failing to improve as anticipated. Contact information: [place] [address] [phone]                 Reviewed expectations re: course of current medical issues. Questions answered. Outlined signs and symptoms indicating need for more acute intervention. Understanding verbalized. After Visit Summary given.   SUBJECTIVE: History from: patient. [name] is a 81 y.o. male who reports: nasal congestion and productive cough; x 3-4 days. Unsure if febrile at times. Denies: difficulty breathing. Normal PO intake without n/v/d.  Social History   Tobacco Use  Smoking Status Former   Packs/day: 1.00   Years: 40.00   Pack years: 40.00   Types: Cigarettes   Quit date: 01/20/2008   Years since quitting: 13.4  Smokeless Tobacco Never    OBJECTIVE:  Vitals:   06/27/21 0843  BP: 133/78  Pulse: 71  Resp: 20  Temp: 98 F (36.7 C)  TempSrc: Oral  SpO2: 97%    General appearance: alert; no distress Eyes: PERRLA; EOMI; conjunctiva normal HENT: [place]; AT; with nasal congestion Neck: supple  Lungs: speaks full sentences without difficulty; unlabored; CTAB Extremities: no edema Skin: warm and dry Neurologic: normal gait Psychological: alert and cooperative; normal mood and affect  Imaging: DG Chest 2 View  Result Date: 06/27/2021 CLINICAL DATA:  Productive cough. EXAM: CHEST - 2 VIEW  COMPARISON:  11/05/2020. FINDINGS: Cardiac silhouette is normal in size. No mediastinal or hilar masses or evidence of adenopathy. Mild scarring in the right upper lobe, stable. Lungs otherwise clear. No pleural effusion or pneumothorax. Skeletal structures are intact. IMPRESSION: No acute cardiopulmonary disease. Electronically Signed   By: Lajean Manes M.D.   On: 06/27/2021 09:18    Allergies  Allergen Reactions   Other Swelling       Substance With 3-hydroxy-3-methylglutaryl-coenzyme A Reductase Inhibitor Mechanism Of Action (substance)      Statins Other (See Comments)    Muscle aches Muscle aches   Rivaroxaban Other (See Comments)    Developed redness in both legs Developed redness in both legs    Past Medical History:  Diagnosis Date   A-fib [facility])    surgerical procedure repair.   AAA (abdominal aortic aneurysm)    AKI (acute kidney injury) ([place]) 04/04/2016   Aneurysm ([place])    Arthritis    to lower back   Chest pain 07/17/2011   Not clearly of cardiac origin.  Will rx empirically with a PPI and assess response.  Admitted to [facility] in 06/2011; echo-moderate LVH, normal EF, mild aortic root dilatation; stress nuclear-normal perfusion, normal EF.    Colon polyps    COPD (chronic obstructive pulmonary disease) ([place])    COVID-19    Degenerative joint disease    had disc fusion in the past   Diabetes mellitus without complication (HCC)    Hearing deficit, bilateral    left  worse than right   History of blood transfusion 12/2007   "bleeding for Warfarin"   Hyperlipidemia    Hypertension    Admitted for chest pain and 06/2011-neg stress Myoview,mod. 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 s/p Watchman   Pneumonia    Renal hemorrhage, right 2009   2009 while anticoagulated  with Coumadin; treated with the renal artery embolization   Renal hemorrhage, right    2009 while anticoagulated with Coumadin; treated with the renal artery embolization    Sepsis ([place]) 04/04/2016   Social History   Socioeconomic History   Marital status: Married    Spouse name: Not on file   Number of children: 2   Years of education: Not on file   Highest education level: Not on file  Occupational History   Occupation: Retired    Comment: Chief Technology Officer job  Tobacco Use   Smoking status: Former    Packs/day: 1.00    Years: 40.00    Pack years: 40.00    Types: Cigarettes    Quit date: 01/20/2008    Years since quitting: 13.4   Smokeless tobacco: Never  Vaping Use   Vaping Use: Never used  Substance and Sexual Activity   Alcohol use: Not Currently    Alcohol/week: 1.0 standard drink    Types: 1 Standard drinks or equivalent per week   Drug use: No   Sexual activity: Not on file  Other Topics Concern   Not on file  Social History Narrative   Retired from Architect   Lives in [place] Resource Strain: Low Risk    Difficulty of Paying Living Expenses: Not hard at all  Food Insecurity: No Food Insecurity   Worried About Charity fundraiser in the Last Year: Never true   Arboriculturist in the Last Year: Never true  Transportation Needs: No Transportation Needs   Lack of Transportation (Medical): No   Lack of Transportation (Non-Medical): No  Physical Activity: Insufficiently Active   Days of Exercise per Week: 2 days   Minutes of Exercise per Session: 40 min  Stress: No Stress Concern Present   Feeling of Stress : Not at all  Social Connections: Socially Integrated   Frequency of Communication with Friends and Family: More than three times a week   Frequency of Social Gatherings with Friends and Family: Twice a week   Attends Religious Services: More than 4 times per year   Active Member of [employer] or  Organizations: Yes   Attends Music therapist: More than 4 times per year   Marital Status: Married  Human resources officer Violence: Not At Risk   Fear of Current or Ex-Partner: No   Emotionally Abused: No   Physically Abused: No   Sexually Abused: No   Family History  Problem Relation Age of Onset   Hypertension Mother    Hyperlipidemia Mother    Renal cancer Mother    Stroke Father    Dementia Father    Atrial fibrillation Sister    Heart disease Brother    Colon cancer Cousin    Colon polyps Neg Hx    Esophageal cancer Neg Hx    Rectal cancer Neg  Hx    Stomach cancer Neg Hx    Past Surgical History:  Procedure Laterality Date   ABDOMINAL AORTIC ENDOVASCULAR FENESTRATED STENT GRAFT N/A 05/08/2020   Procedure: ABDOMINAL AORTIC ENDOVASCULAR FENESTRATED STENT GRAFT;  Surgeon: [name], MD;  Location: [facility];  Service: Vascular;  Laterality: N/A;   BIOPSY  02/21/2017   Procedure: BIOPSY;  Surgeon: Danie Binder, MD;  [facility];  Service: Endoscopy;;  gastric   COLONOSCOPY N/A 02/21/2017   multiple polyps, one requiring piecemeal removal with epi and clip placement, multiple small and large mouthed diverticula, recto-sigmoid and sigmoid with significant excess looping, internal hemorrhoids. Path with one advanced adenoma, 3 simple adenomas, 2 hyperplastic polyps. Survelilance 3 years    EMBOLIZATION  12/2007   Left renal hematoma, status post angiogram and embolization/notes 09/29/2010   ESOPHAGOGASTRODUODENOSCOPY N/A 02/21/2017   benign-appearing esophageal stenosis s/p dilation, gastritis, scarred mucosa in pylorus due to prior PUD. reactive gastropathy on path   FRACTURE SURGERY     LEFT ATRIAL APPENDAGE OCCLUSION N/A 03/18/2016   Procedure: LEFT ATRIAL APPENDAGE OCCLUSION;  Surgeon: [name], MD;  Location: [facility];  Service: Cardiovascular;  Laterality: N/A;   ORIF FEMUR FRACTURE Right 1960   "2 steel rods in leg"   PATCH  ANGIOPLASTY Left 05/08/2020   Procedure: PATCH ANGIOPLASTY LEFT FEMORAL ARTERY;  Surgeon: [name], MD;  Location: [place];  Service: Vascular;  Laterality: Left;   PILONIDAL CYST EXCISION     POLYPECTOMY  02/21/2017   Procedure: POLYPECTOMY;  Surgeon: Danie Binder, MD;  [facility];  Service: Endoscopy;;  colon   REPAIR ILIAC ARTERY Left 05/08/2020   Procedure: EXPLORATION AND REPAIR LEFT FEMORAL ARTERY;  Surgeon: [name], MD;  Location: [facility];  Service: Vascular;  Laterality: Left;   SAVORY DILATION N/A 02/21/2017   Procedure: SAVORY DILATION;  Surgeon: Danie Binder, MD;  [facility];  Service: Endoscopy;  Laterality: N/A;   TEE WITHOUT CARDIOVERSION N/A 03/10/2016   Procedure: TRANSESOPHAGEAL ECHOCARDIOGRAM (TEE);  Surgeon: Josue Hector, MD;  Location: [facility] END[facility];  Service: Cardiovascular;  Laterality: N/A;   TEE WITHOUT CARDIOVERSION N/A 04/30/2016   Procedure: TRANSESOPHAGEAL ECHOCARDIOGRAM (TEE);  Surgeon: Josue Hector, MD;  Location: [facility];  Service: Cardiovascular;  Laterality: N/A;   ULTRASOUND GUIDANCE FOR VASCULAR ACCESS Bilateral 05/08/2020   Procedure: ULTRASOUND GUIDANCE FOR VASCULAR ACCESS, bilateral femoral arteries;  Surgeon: [name], MD;  Location: [place];  Service: Vascular;  Laterality: Bilateral;   Watchman procedure  02/2016     Vanessa Kick, MD 06/27/21 [phone]

## 2021-06-27 NOTE — Discharge Instructions (Addendum)
Your chest x-ray was normal today.

## 2021-07-10 ENCOUNTER — Encounter: Payer: Self-pay | Admitting: *Deleted

## 2021-07-10 DIAGNOSIS — Z2821 Immunization not carried out because of patient refusal: Secondary | ICD-10-CM | POA: Insufficient documentation

## 2021-08-12 ENCOUNTER — Telehealth: Payer: PPO

## 2021-08-12 ENCOUNTER — Ambulatory Visit: Payer: PPO | Admitting: Internal Medicine

## 2021-08-14 ENCOUNTER — Other Ambulatory Visit: Payer: Self-pay

## 2021-08-14 ENCOUNTER — Telehealth: Payer: Self-pay | Admitting: Internal Medicine

## 2021-08-14 DIAGNOSIS — I10 Essential (primary) hypertension: Secondary | ICD-10-CM

## 2021-08-14 MED ORDER — TRIAMTERENE-HCTZ 37.5-25 MG PO TABS: 1.0000 | ORAL_TABLET | Freq: Every day | ORAL | 1 refills | Status: AC

## 2021-08-14 NOTE — Telephone Encounter (Signed)
Patient needs refill on  ? ?triamterene-hydrochlorothiazide (MAXZIDE-25) 37.5-25 MG tablet  ?

## 2021-08-14 NOTE — Telephone Encounter (Signed)
Sent refill to pharmacy. 

## 2021-08-18 ENCOUNTER — Other Ambulatory Visit: Payer: Self-pay | Admitting: *Deleted

## 2021-08-18 DIAGNOSIS — I10 Essential (primary) hypertension: Secondary | ICD-10-CM

## 2021-08-18 MED ORDER — METOPROLOL SUCCINATE ER 25 MG PO TB24: 25.0000 mg | ORAL_TABLET | Freq: Every day | ORAL | 0 refills | Status: AC

## 2021-08-27 ENCOUNTER — Ambulatory Visit: Payer: Self-pay | Admitting: Internal Medicine

## 2021-09-08 ENCOUNTER — Telehealth: Payer: Self-pay | Admitting: Internal Medicine

## 2021-09-08 ENCOUNTER — Other Ambulatory Visit: Payer: Self-pay | Admitting: *Deleted

## 2021-09-08 DIAGNOSIS — I482 Chronic atrial fibrillation, unspecified: Secondary | ICD-10-CM

## 2021-09-08 DIAGNOSIS — E785 Hyperlipidemia, unspecified: Secondary | ICD-10-CM

## 2021-09-08 MED ORDER — DILTIAZEM HCL ER COATED BEADS 240 MG PO CP24: 240.0000 mg | ORAL_CAPSULE | Freq: Every day | ORAL | 1 refills | Status: AC

## 2021-09-08 MED ORDER — ROSUVASTATIN CALCIUM 5 MG PO TABS: 5.0000 mg | ORAL_TABLET | Freq: Every day | ORAL | 1 refills | Status: AC

## 2021-09-08 NOTE — Telephone Encounter (Signed)
Patient needs refill on  ? ?rosuvastatin (CRESTOR) 5 MG tablet [ ? ?diltiazem (CARDIZEM CD) 240 MG 24 hr capsule  ?

## 2021-09-08 NOTE — Telephone Encounter (Signed)
Medication sent to pharmacy  

## 2021-09-09 ENCOUNTER — Other Ambulatory Visit: Payer: Self-pay | Admitting: Internal Medicine

## 2021-09-09 ENCOUNTER — Telehealth: Payer: Self-pay

## 2021-09-09 DIAGNOSIS — F419 Anxiety disorder, unspecified: Secondary | ICD-10-CM

## 2021-09-09 MED ORDER — LORAZEPAM 0.5 MG PO TABS: 0.5000 mg | ORAL_TABLET | Freq: Every day | ORAL | 0 refills | Status: AC

## 2021-09-09 NOTE — Telephone Encounter (Signed)
Pt called and asked for you to refill Lorazepam.  05 mg.  I do not see that on the med list.  He said Donneta Romberg gave that to him and he needs a refill called into [facility] on line pharmacy.  ?

## 2021-09-09 NOTE — Telephone Encounter (Signed)
Contacted patient to schedule an appointment, he said he in the process of changing things around, will call us back.   ?

## 2021-09-09 NOTE — Telephone Encounter (Signed)
Noted will not fill this anymore until appt is made ?

## 2021-10-05 ENCOUNTER — Ambulatory Visit: Payer: Self-pay

## 2022-07-30 IMAGING — CT CT CTA ABD/PEL W/CM AND/OR W/O CM
1 of 4 series · 10 of 32 positions shown, 13 images · IV contrast (iopamidol)
Comparison: 01/16/2020 and 06/26/2018 and 12/27/2018 and 06/03/2015

CLINICAL DATA: Abdominal aortic aneurysm surveillance.

EXAM:
CTA ABDOMEN AND PELVIS WITHOUT AND WITH CONTRAST
TECHNIQUE: Multidetector CT imaging of the abdomen and pelvis was performed
using the standard protocol during bolus administration of
intravenous contrast. Multiplanar reconstructed images and MIPs were
obtained and reviewed to evaluate the vascular anatomy.
Creatinine was obtained on site at [HOSPITAL] at [HOSPITAL].
Results: Creatinine 1.3 mg/dL.
CONTRAST:  75mL I6LD8P-M7H IOPAMIDOL (I6LD8P-M7H) INJECTION 76%

[Series 5: pre stent angio · axial · non-contrast · 0.92mm/px · z∈[-457,-34]mm · 10 of 509 slices shown, 13 images]
[im 57/509  soft-tissue]
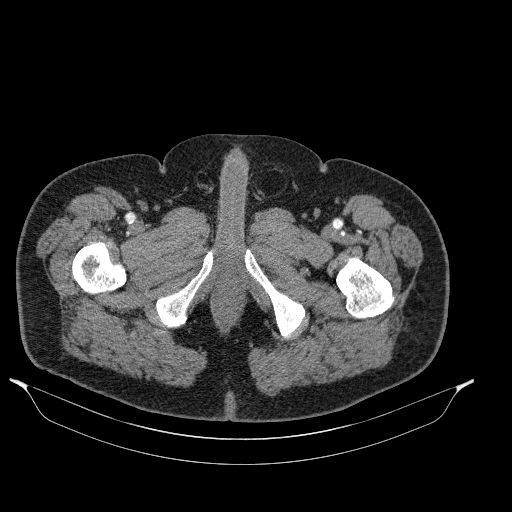
[im 57/509  bone]
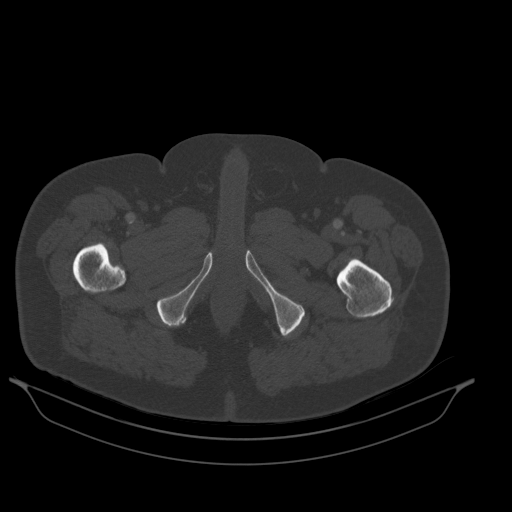
[im 113/509  soft-tissue]
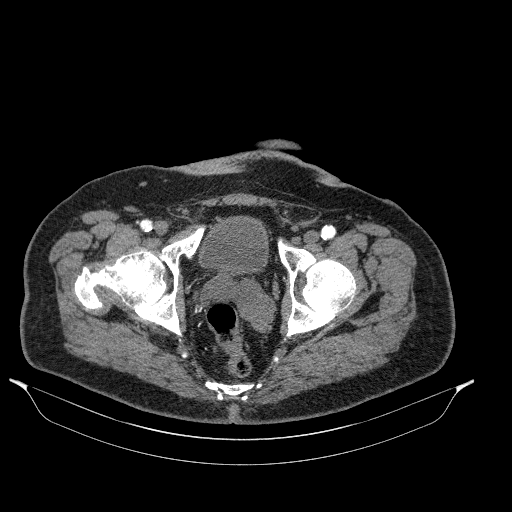
[im 170/509  soft-tissue]
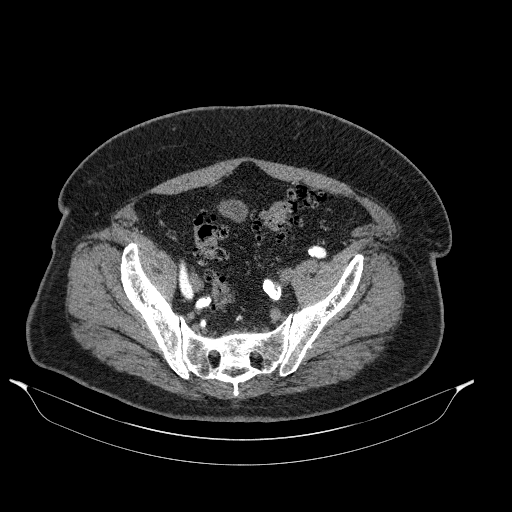
[im 226/509  soft-tissue]
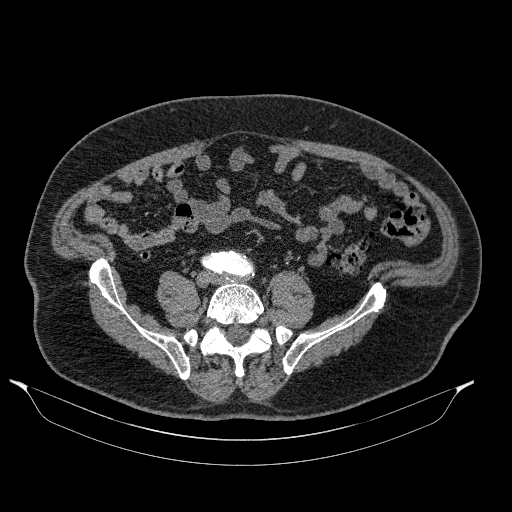
[im 283/509  soft-tissue]
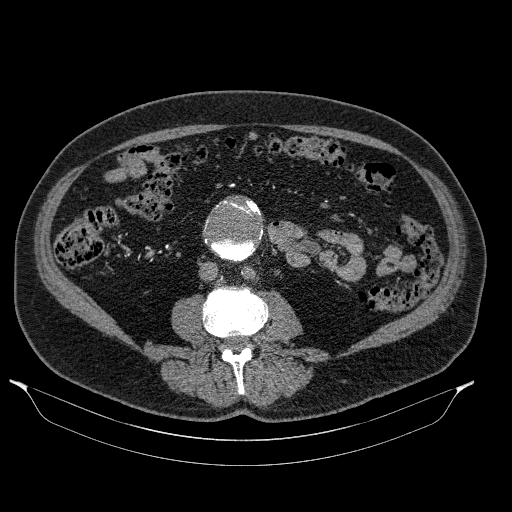
[im 339/509  soft-tissue]
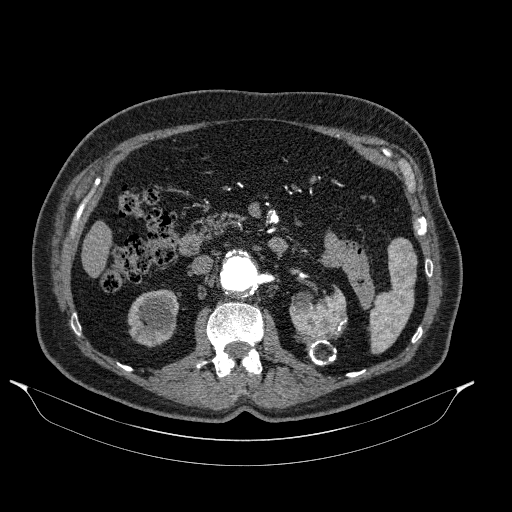
[im 396/509  soft-tissue]
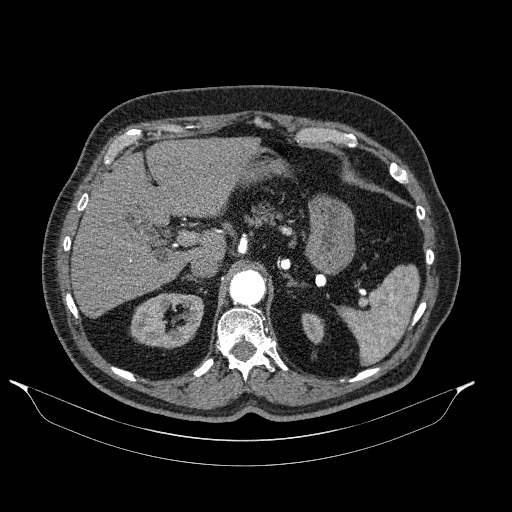
[im 396/509  lung]
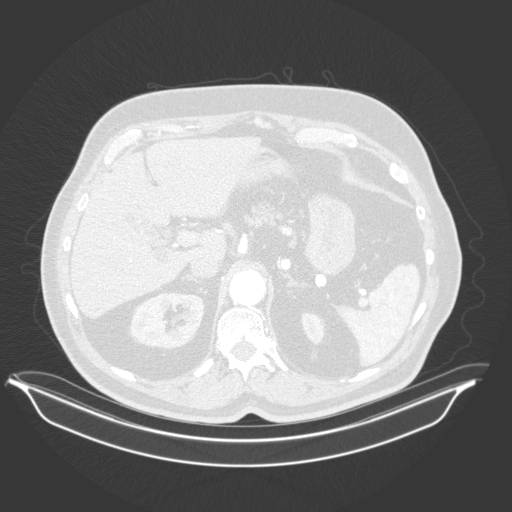
[im 424/509  lung]
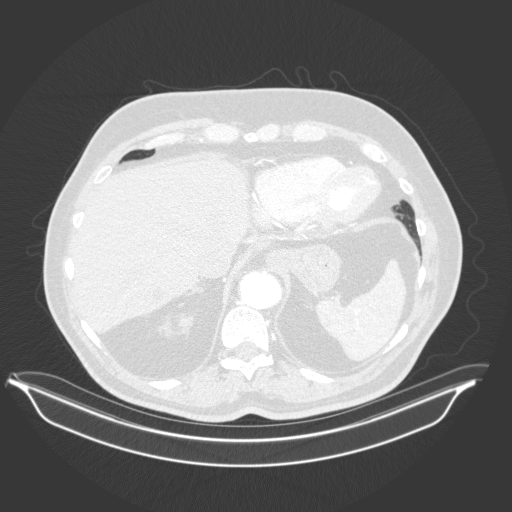
[im 452/509  soft-tissue]
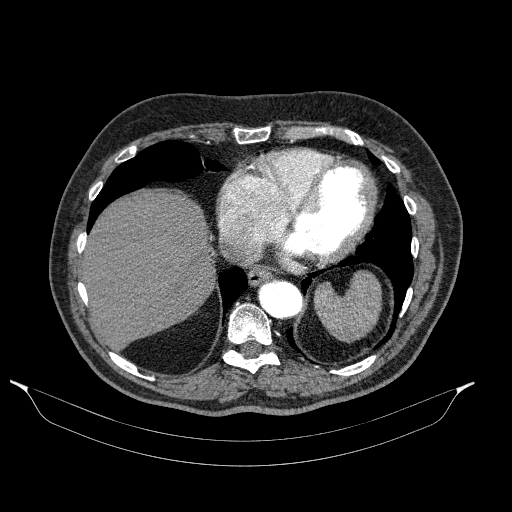
[im 452/509  lung]
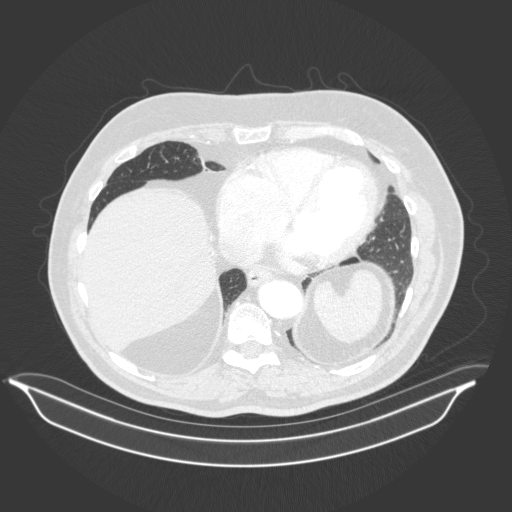
[im 480/509  lung]
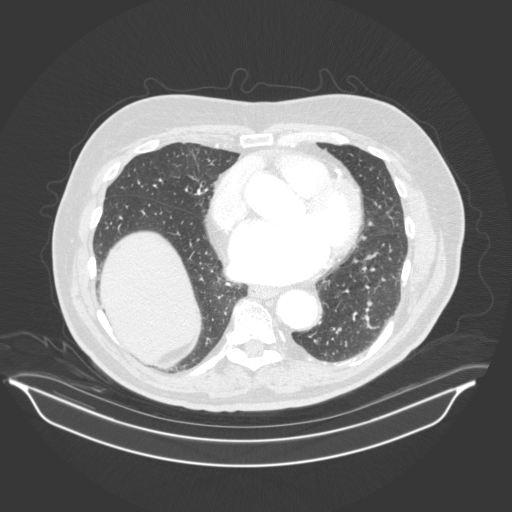

[10 of 32 positions shown; findings below may reference images not displayed]

FINDINGS: VASCULAR

Aorta: Visualized ascending thoracic aorta is enlarged measuring up
to 4.3 cm. Descending thoracic aorta measures 3.5 cm.
Atherosclerotic calcifications involving the descending thoracic
aorta. Aorta at the hiatus measures 3.2 cm. The juxtarenal abdominal
aortic aneurysm measures up to 3.5 cm. The abdominal aorta extends
anterior from the spine in the infrarenal region. Maximum AP
dimension of the infrarenal abdominal aortic aneurysm is 6.0 cm and
measured up to 5.6 cm in November 2018. Aneurysm has not significantly
changed since exam on 01/16/2020. Transverse dimension of the
infrarenal abdominal aortic aneurysm measures 5.6 cm and previously
measured 5.0 cm on 12/27/2018. Again noted is a large amount of
mural thrombus involving the infrarenal abdominal aortic aneurysm
occupying up to 75% of the lumen. Multiple calcifications involving
the mural thrombus. Aneurysm terminates at the bifurcation. No
evidence for an aortic dissection.

Celiac: Mild narrowing at the origin may be associated with median
arcuate ligament compression. Main branch vessels are patent.

SMA: Origin of the SMA is widely patent. There is a replaced right
hepatic artery which is a normal variant. Atherosclerotic disease of
the SMA distal to the replaced right hepatic artery and similar to
the previous examination. No significant stenosis. Positive
remodeling of the SMA at the area of plaque with diameter measuring
up to 1.0 cm and stable.

Renals: Both renal arteries are patent without evidence of aneurysm,
dissection, vasculitis, fibromuscular dysplasia or significant
stenosis.

IMA: Origin is occluded from the aneurysm mural thrombus. Distal
reconstitution through collateral flow.

Inflow: Aneurysm of the left common iliac artery is 2.5 cm and
minimally changed. Diffuse atherosclerotic disease in the iliac
arteries. Common, internal and external iliac arteries are patent
bilaterally. Focal aneurysm in the left internal iliac artery
measures 1.7 cm and stable.

Proximal Outflow: Proximal femoral arteries are patent bilaterally.

Veins: Portal venous system is patent.  IVC renal veins are patent.

Review of the MIP images confirms the above findings.

NON-VASCULAR

Lower chest: Lung bases are clear.  No large pleural effusions.

Hepatobiliary: Abnormal appearance of the gallbladder. The
gallbladder has a nodular contour with some wall thickening or
high-density material within the gallbladder. Again noted are small
low-density structures in the liver suggestive for small cysts.
There is at least 1 cystic structure near the gallbladder that
measures 1.4 cm and similar to the recent comparison examination.
The appearance of the gallbladder is similar to the recent
comparison exams but significantly different from exams dating back
in 0216 and 5999. Findings may represent postinflammatory changes in
the gallbladder. There is no significant intrahepatic or
extrahepatic biliary dilatation. Main portal venous system is
patent. No suspicious liver lesion.

Pancreas: Unremarkable. No pancreatic ductal dilatation or
surrounding inflammatory changes.

Spleen: Normal in size without focal abnormality.

Adrenals/Urinary Tract: Normal appearance of the adrenal glands.
Again noted is a hyperdense exophytic structure involving the
lateral right kidney that measures 1.7 cm on sequence 4, image 73.
This is similar to the recent examination most compatible with a
hyperdense or proteinaceous cyst based on the previous examination.
Numerous cysts throughout the right kidney without hydronephrosis.
Again noted is a slightly hyperdense cystic structure in the central
aspect of the right kidney on sequence 7, image 16 that measures
roughly 1.9 cm. Small amount of fluid in the urinary bladder. Again
noted are embolization coils in the left kidney. There is a chronic
exophytic calcified lesion in the posterior left kidney measures up
to 2.2 cm and minimally changed. Numerous left renal cysts. Again
noted is an indeterminate structure in the posterolateral left
kidney on sequence 4, image 72 that measures roughly 1.1 cm. This is
similar to the recent exam on 01/16/2020. This lesion was thought to
be suspicious for small renal cell carcinoma on the previous
examination.

Stomach/Bowel: Severe diverticulosis involving the sigmoid colon
without acute inflammation. Normal appearance of the appendix.
Negative for bowel obstruction. There is a broad-based exophytic
structure along the anterior aspect of the stomach on sequence 4,
image 67. This measures roughly 1.5 cm on sequence 7, image 14. This
is most compatible with a small enhancing lesion. This gastric
structure has not significantly changed from exam on 06/03/2015.

Lymphatic: Small lymph nodes in the upper thigh region are likely
reactive. Slightly prominent retroperitoneal lymph nodes are
unchanged. Small lymph nodes in the upper abdomen appear stable.

Reproductive: Seminal vesicles are prominent and lobulated but
stable. No significant change the prostate.

Other: Negative for ascites. Negative for free air. Small inguinal
hernias containing fat, left side greater than right.

Musculoskeletal: No acute bone abnormality.
IMPRESSION: VASCULAR

1. Abdominal aortic aneurysm measuring up to 6.0 cm. Aneurysm has
not significantly changed since 01/16/2020 but there has been
interval enlargement since 12/27/2018. Infrarenal abdominal aortic
aneurysm contains a large amount of mural thrombus and similar to
the prior examinations.
2. Aneurysm of the ascending thoracic aorta measuring up to 4.3 cm.
Recommend annual imaging followup by CTA or MRA. This recommendation
follows 6040 ACCF/AHA/AATS/ACR/ASA/SCA/TONE/INARI/RAFIA/DEECON Guidelines
for the Diagnosis and Management of Patients with Thoracic Aortic
Disease. Circulation. 6040; 121: E266-e369. Aortic aneurysm NOS
([id number].I)
3. Stable aneurysm of the left common iliac artery that measures
cm. Stable aneurysm involving the left internal iliac artery.
4. Origin of the IMA is occluded from the aortic aneurysm and mural
thrombus.

NON-VASCULAR

1. **An incidental finding of potential clinical significance has
been found. Slightly exophytic lesion along the anterior aspect of
the stomach. Structure measures roughly 1.5 cm and has minimally
changed since 5999. Lesion appears to be enhancing and differential
diagnosis would include an indolent neoplastic process such as a
GIST.**
2. Indeterminate 1.1 cm lesion in left kidney. Lesion has not
significantly changed since the recent examination on 01/16/2020.
Recommend continued follow-up by urology.
3. No acute abnormality abdomen or pelvis.
4. Chronic abnormal appearance of the gallbladder. Findings probably
represent a combination of post inflammatory changes with
cholelithiasis.

These results will be called to the ordering clinician or
representative by the Radiologist Assistant, and communication
documented in the PACS or [REDACTED].
